# Patient Record
Sex: Male | Born: 1970 | Race: White | Hispanic: No | Marital: Single | Smoking: Current every day smoker
Health system: Southern US, Community
[De-identification: ages and names within clinical notes are randomized; demographics above are authoritative.]

## PROBLEM LIST (undated history)

## (undated) DIAGNOSIS — F329 Major depressive disorder, single episode, unspecified: Secondary | ICD-10-CM

## (undated) DIAGNOSIS — M545 Low back pain, unspecified: Secondary | ICD-10-CM

## (undated) DIAGNOSIS — G8929 Other chronic pain: Secondary | ICD-10-CM

## (undated) DIAGNOSIS — K259 Gastric ulcer, unspecified as acute or chronic, without hemorrhage or perforation: Secondary | ICD-10-CM

## (undated) DIAGNOSIS — F32A Depression, unspecified: Secondary | ICD-10-CM

## (undated) DIAGNOSIS — K922 Gastrointestinal hemorrhage, unspecified: Secondary | ICD-10-CM

## (undated) DIAGNOSIS — Z87442 Personal history of urinary calculi: Secondary | ICD-10-CM

## (undated) DIAGNOSIS — K7460 Unspecified cirrhosis of liver: Secondary | ICD-10-CM

## (undated) DIAGNOSIS — B1920 Unspecified viral hepatitis C without hepatic coma: Secondary | ICD-10-CM

## (undated) DIAGNOSIS — F419 Anxiety disorder, unspecified: Secondary | ICD-10-CM

## (undated) DIAGNOSIS — B1910 Unspecified viral hepatitis B without hepatic coma: Secondary | ICD-10-CM

## (undated) DIAGNOSIS — F1910 Other psychoactive substance abuse, uncomplicated: Secondary | ICD-10-CM

## (undated) HISTORY — PX: LUNG LOBECTOMY: SHX167

## (undated) HISTORY — PX: CYSTOSCOPY W/ STONE MANIPULATION: SHX1427

---

## 2003-10-15 ENCOUNTER — Encounter
Admission: RE | Admit: 2003-10-15 | Discharge: 2004-01-13 | Payer: Self-pay | Admitting: Physical Medicine & Rehabilitation

## 2004-02-01 ENCOUNTER — Encounter
Admission: RE | Admit: 2004-02-01 | Discharge: 2004-05-01 | Payer: Self-pay | Admitting: Physical Medicine & Rehabilitation

## 2004-03-03 ENCOUNTER — Inpatient Hospital Stay (HOSPITAL_COMMUNITY): Admission: RE | Admit: 2004-03-03 | Discharge: 2004-03-06 | Payer: Self-pay | Admitting: Psychiatry

## 2006-06-10 ENCOUNTER — Ambulatory Visit: Payer: Self-pay | Admitting: Critical Care Medicine

## 2006-06-10 ENCOUNTER — Inpatient Hospital Stay (HOSPITAL_COMMUNITY): Admission: AD | Admit: 2006-06-10 | Discharge: 2006-06-29 | Payer: Self-pay | Admitting: Emergency Medicine

## 2006-06-10 ENCOUNTER — Encounter (INDEPENDENT_AMBULATORY_CARE_PROVIDER_SITE_OTHER): Payer: Self-pay | Admitting: Specialist

## 2006-06-15 ENCOUNTER — Encounter: Payer: Self-pay | Admitting: Cardiology

## 2006-06-15 ENCOUNTER — Ambulatory Visit: Payer: Self-pay | Admitting: Cardiology

## 2006-06-23 ENCOUNTER — Ambulatory Visit: Payer: Self-pay | Admitting: Dentistry

## 2006-07-13 ENCOUNTER — Encounter: Admission: RE | Admit: 2006-07-13 | Discharge: 2006-07-13 | Payer: Self-pay | Admitting: Surgery

## 2007-11-28 ENCOUNTER — Emergency Department (HOSPITAL_COMMUNITY): Admission: EM | Admit: 2007-11-28 | Discharge: 2007-11-28 | Payer: Self-pay | Admitting: Emergency Medicine

## 2011-05-01 NOTE — Discharge Summary (Signed)
Dan [name], Dan [name]                   ACCOUNT NO.:  [id number]   MEDICAL RECORD NO.:  [id number]          PATIENT TYPE:  INP   LOCATION:  5739                         FACILITY:  [facility]   PHYSICIAN:  Danice Goltz, M.D. LHCDATE OF BIRTH:  09-10-1971   DATE OF ADMISSION:  06/10/2006  DATE OF DISCHARGE:  06/29/2006                                 DISCHARGE SUMMARY   DISCHARGE DIAGNOSES:  1.  Lung abscess with empyema with few Peptostreptococcus prevotii, status      post right thoracotomy with drainage of empyema and decortication of      right lung with chest tube insertion.  2.  Poor dentition.  3.  Chronic pain.  4.  Acute lung injury with hypoxic respiratory failure/ventilator-dependent      respiratory failure, resolved.   PROCEDURE:  1.  PICC line placed June 10, 2006, removed today June 29, 2006.  2.  Endotracheal tube placed June 10, 2006, removed June 20, 2006.  3.  Arterial line placed June 10, 2006, removed June 20, 2006.  4.  Right arterial jugular placed June 29, removed June 19, 2006.  5.  Right thoracotomy with drainage of empyema and decortication of right      lung completed on June 11, 2006.   CONSULTANTS:  Dr. Charlynne Pander with dental medicine, [facility]. Also Dr. Evelene Croon with CVTS.   LABORATORY DATA:  June 28, 2006, sodium 137, potassium 4, chloride 99, CO2  29, glucose 126, BUN 17, creatinine 0.6, AST 42, ALT 51, albumin 2.3. June 28, 2006, white blood cells 11.2, hemoglobin 10.9, hematocrit 32.9,  platelets 454.   Microbiology:  Blood cultures x2 on June 19, 2006 with coagulase negative  staph/staph epi (contamination). Sputum culture on June 15, 2006 negative for  organisms. June 10, 2006, blood cultures x2 negative. June 11, 2006, pleural  fluid culture from right empyema x2 cultures, both demonstrating few  Peptostreptococcus prevotii.   Radiology:  Chest x-ray obtained on June 28, 2006 demonstrating right chest  tube in  satisfactory position, diffuse interstitial lung disease which shows  significant improvement in increased aeration compared with previous film  obtained on June 24, 2006.   BRIEF HISTORY:  This is a 40 year old white male who was admitted in  transfer from [facility] with right lung pneumonia and significant  right pleural effusion in respiratory distress on June 10, 2006. He had  apparently originally presented to [facility] on June 08, 2006 with  right lung pneumonia and pleural effusion. There was cavitation of the right  upper lobe, and the patient developed progressive respiratory distress. He  has a history of cocaine abuse, alcohol abuse. He is a smoker. He has  chronic back pain for which he takes OxyContin at home. Socially, the  patient is single. He is a 2 pack per day smoker for greater than 20 years,  history of moderate alcohol abuse, history of cocaine abuse, is on chronic  OxyContin for back pain. He additionally was on house arrest in [facility].   HOSPITAL COURSE  BY DISCHARGE DIAGNOSES:  1.  Bilateral necrotizing pneumonia with right sided empyema with resultant      acute lung injury, acute respiratory distress and ventilatory dependence      (resolved). Dan [name] was admitted to [facility] in transfer on      June 10, 2006, initially on 100% FiO2 support. He developed      significantly progressive increased work of breathing whereby later on      the day of admission he required oral intubation by anesthesia. He was      maintained on mechanical ventilation, his vital signs were stabilized      with aggressive volume resuscitation, also he was given empiric      antibiotics consisting of vancomycin, cefepime, Zithromax and Cleocin.      This was later changed to single coverage with  Zosyn on June 12, 2006      and discontinued and replaced with Cleocin on June 23, 2006 for which he      will complete 3 more weeks after time of discharge  of therapy.      Thoracentesis was obtained demonstrating Peptostreptococcus prevotii.      Thoracic surgery was consulted. Dr. Evelene Croon brought [name] to the      operating room and performed a right thoracotomy, drainage of right      empyema with decortication of right lung and placement of chest tube.      The patient's surgery was unremarkable. He did have a moderate amount of      bronchopleural fistula postoperatively requiring ventilation with low      levels of PEEP and chest tube to suction. This eventually resolved. He      was eventually successfully weaned and extubated from mechanical      ventilation. Upon time of discharge, he still has a right chest tube in      place which is draining slightly purulent drainage. His chest x-ray      continues to resolve in respect to his acute lung injury. He has      followup with thoracic surgery for further evaluation of disposition of      chest tube. Home health has been ordered in regards to chest tube site      care and chest tube maintenance. He will be discharged to home with Mini      Express Pleur-evac which is a portable chest tube device. Additionally,      he will be sent home on daily spironolactone and twice daily Advair. As      previously noted, he will complete three more weeks of oral Cleocin. No      further pulmonary followup is noted. He does have followup arranged with      thoracic surgery in regards to chest tube management.  2.  Poor dentition. The patient has very poor dentition, and this perhaps      represents again future risk for recurrent bacterial infections. Dr. Dian Queen was consulted inpatient. At this time, no surgical intervention      was needed. However, potential therapies could include multiple      extractions with alveoplasty, periodontal therapy, dental restoration,      crown or bridge therapy, root canal therapy, implant therapy, or     replacing missing teeth as indicated. The  potential therapy for the      patient's dental health will be deferred to Dr. Dian Queen. Followup  was arranged with Dr. Kristin Bruins on July 21, 2006 at which time the      patient will report to the [facility] for      followup.  3.  Chronic pain with history of polysubstance abuse. Mr. Vincent has a long      history of chronic pain and has had notable pain medication needs while      in the hospital. Given his complicated medical history in regards to      narcotic abuse, we will defer to his outpatient internal medicine      physician for management of his chronic pain syndrome. He had previously      been followed by Dr. Jeanie Sewer at [facility]. He will now      be followed by Dr. Feliciana Rossetti; phone number [phone]. We      respectively will defer all anxiolytic and narcotic medications to Dr.      Quintella Reichert to avoid multiple prescriptions of narcotics and hopefully limit      risk of polypharmacy/narcotic abuse.   DISCHARGE INSTRUCTIONS:  1.  Diet as tolerated.  2.  Activity as tolerated.  3.  Sterile gauze occlusive dressing to chest tube site with routine skin      care per home health.   MEDICATIONS:  1.  Advair Diskus 250/50 mcg 1 puff twice a day.  2.  Cleocin 300-mg tablets 2 tablets q.8h. until gone.  3.  Nu-Iron 150 mg 1 tablet twice a day.  4.  Spiriva 18 mcg 2 puffs 1 tablet daily.  5.  Multivitamin daily.  6.  Folic 1 mg daily.  7.  All pain medication and anxiolytics will be deferred to the patient's      primary care physician, Dr. Feliciana Rossetti in [place].   PHYSICAL EXAMINATION AT TIME OF DISCHARGE:  VITAL SIGNS:  Temperature 97.9,  heart rate 72, respirations 18, blood pressure 104/59, blood glucose 109.  Room air saturation 98%; he ambulated approximately 150 feet in the hall  with saturations dropping no lower than 94%.  HEENT:  Notable for poor oral dentition. No JVD or adenopathy.  PULMONARY:  Faint crackles in  bilateral lower lobes, slightly more noted in  the right. He has a right chest tube with a Mini Express Pleur-evac in place  on the right. There is a scant amount of yellow color discharge around the  chest tube insertion site. The insertion site is slightly erythematous but  otherwise unremarkable. He reports no significant pain to palpation. The  chest tube circuit does have some purulent drainage in the chest tube site,  and he puts out approximately 75 mL of drainage about every 8 hours.  CARDIAC:  Regular rate and rhythm.  EXTREMITIES:  Warm without edema. He does have brisk capillary refill. He  does have a right upper extremity PICC line currently in place which will be  removed prior to discharge.  ABDOMEN:  Soft and nontender. Tolerating diet.  NEUROLOGICAL:  Grossly intact.   DISPOSITION:  Currently, patient will be discharged to home. As previously  noted, Mr. Maryland is under house arrest. A message was left with the [facility] Department for the patient to have his leg bracelet  reapplied. He will have home health nurse followup in the outpatient  setting. He is currently ready for discharge and stable from a medical  standpoint. No further pulmonary critical care medicine followup required.  Over 30 minutes' time was dedicated  to this discharge.      Anders Simmonds, N.P. [facility]    ______________________________  Danice Goltz, M.D. [facility]    PB/MEDQ  D:  06/29/2006  T:  06/29/2006  Job:  [id number]   cc:   Evelene Croon, M.D.  [address]  [place]  [place] [address]   Lacretia Leigh. Quintella Reichert, M.D.  [address]  [place] [address]   Charlynne Pander, D.D.S.  Fax: [phone]   Danice Goltz, M.D. [facility]  [address] [place], [place] [address]

## 2011-05-01 NOTE — Consult Note (Signed)
NAMEDEMARQUIS, OSLEY                   ACCOUNT NO.:  [id number]   MEDICAL RECORD NO.:  [id number]           PATIENT TYPE:   LOCATION:                               FACILITY:  [facility]   PHYSICIAN:  Charlynne Pander, D.D.S.DATE OF BIRTH:  04/25/1971   DATE OF CONSULTATION:  06/23/2006  DATE OF DISCHARGE:                                   CONSULTATION   Dan [name] is a 40 year old male recently admitted with bilateral necrotizing  pneumonia.  The patient subsequently underwent a right thoracotomy with  draining of the empyema on June 11, 2006 with Dr. Laneta Simmers.  A dental  consultation was requested to rule out dental infection which may affect the  patient's systemic health.   MEDICAL HISTORY:  1.  Bilateral necrotizing pneumonia with a history of ventilator dependent      respiratory failure.  2.  The patient is status post right thoracotomy with draining of the      empyema and placement of and drains.  This was done by Dr. Laneta Simmers on      June 11, 2006.  The patient is currently followed by a pulmonary      medicine.  3.  History of polysubstance abuse including cocaine, alcohol, and tobacco.  4.  Chronic back pain, status post surgery approximately 5 years ago.      Patient with a history of utilizing OxyContin 40 mg twice daily.  5.  History of atrial fibrillation this admission which has returned to      normal sinus rhythm.   ALLERGIES:  None known.   MEDICATIONS:  1.  NovoLog insulin per sliding scale.  2.  Atrovent inhalation therapy every 6 hours.  3.  Xopenex inhalation therapy every 6 hours.  4.  Lovenox 40 mg subcutaneously every 24 hours.  5.  Folic acid 1 mg daily.  6.  Thiamine 100 mg daily.  7.  Klonopin 1 mg at bedtime.  8.  Protonix 40 mg daily.  9.  OxyContin 40 mg daily.  10. Cleocin 600 mg every 6 hours.   SOCIAL HISTORY:  The patient is unemployed.  The patient is single.  Patient  with a history of smoking two packs per day for 20 years.  Patient with a  history of moderate alcohol use, although the patient denies use recently.  The patient also with a history of cocaine abuse.   FAMILY HISTORY:  Mother with a history of diabetes mellitus.  Father with a  history of coronary disease.   FUNCTIONAL ASSESSMENT:  The patient was independent for ADLs prior to this  admission.   REVIEW OF SYSTEMS:  This is reviewed from the chart and health history  assessment form for this admission.   DENTAL HISTORY.:   CHIEF COMPLAINT:  Dental consultation requested to evaluate poor  dentition.   HISTORY OF PRESENT ILLNESS:  The patient recently admitted with a history of  bilateral necrotizing pneumonia.  The patient known to have a poor  dentition.  The patient now seen to rule out dental infection which may be  affecting  the patient's systemic health.  The patient currently denies acute  toothache symptoms.  The patient indicates that he last saw dentist 2-3  years ago for an exam and cleaning.  The patient denies presence of  dentures.  The patient after further discussion does have an indication of  previous toothache of the upper right quadrant approximately 2 months ago.  The patient described the pain as sharp, throbbing, and lasted 3-4 days  constantly.  The patient described the pain as being 10/10 intensity.  The  patient again indicates that he is 0/10 intensity pain.   DENTAL EXAMINATION:  GENERAL:  The patient is a well-developed, slightly  built male in no acute distress.  VITAL SIGNS:  Blood pressure is 115/68, pulse 95, respirations are 20,  temperature is 98.0.  HEAD/NECK:  There is no palpable lymphadenopathy.  The patient denies acute  TMJ symptoms.  INTRAORAL:  The patient has normal saliva.  There is no evidence of abscess  formation within the mouth at this time.  DENTITION:  Patient with multiple missing teeth.  I would need dental x-ray  to identify the exact tooth numbers present and missing.  PERIODONTAL: The patient  with chronic advanced periodontal disease with  plaque and calculus accumulations, gingival recession, tooth mobility, and  moderate to severe bone loss.  DENTAL CARIES:  The patient may have several dental caries noted.  I would  need dental x-rays to identify the extent of the dental caries.  ENDODONTIC: H/O acute pulpitis symptoms. none today.  CROWN/BRIDGES:  There are no crown or bridge restorations.  PROSTHODONTIC:  There are no dentures.  OCCLUSION:  The patient with a poor occlusal scheme.   RADIOGRAPHIC INTERPRETATION:  A panoramic x-ray has been ordered and will be  taken once the patient is able to sit in a wheelchair or stand  independently.   ASSESSMENT:  1.  Plaque and calculus accumulations.  2.  Chronic periodontitis with bone loss.  3.  Gingival recession.  4.  Tooth mobility.  5.  History of oral neglect.  6.  Multiple missing teeth.  7.  Supereruption and drifting of the unopposed teeth into the edentulous      areas.  8.  Poor occlusal scheme.  9.  Current Lovenox with a risk for bleeding with invasive dental      procedures.  10. Current significant medical compromise with need for healing prior to      multiple invasive dental procedures.   PLAN/RECOMMENDATIONS:  1.  I have discussed the risks, benefits, and complications of various      treatment options with the patient in relationship to his medical and      dental conditions.  We discussed obtaining dental x-rays and then      provision of dental treatment to include no treatment, multiple      extractions with alveoloplasty, periodontal therapy, dental      restorations, crown or bridge therapy, root canal therapy, implant      therapy, replacing missing teeth as indicated.  The patient currently      agrees to have the panoramic x-ray obtained, once he is able to stand or      sit in a wheelchair.  The patient will then be offered the various      treatment options as above.  1.  Discussion of  findings with Dr. Evelene Croon and Dr. Shan Levans as      indicated with coordination of future dental treatment as  indicated.      Charlynne Pander, D.D.S.  Electronically Signed     RFK/MEDQ  D:  06/23/2006  T:  06/23/2006  Job:  [id number]   cc:   Evelene Croon, M.D.  [address]  [place]  [place] [address]   Shan Levans, M.D. [facility]  [address]  [place]  [place] [address]   Charlynne Pander, D.D.S.  Fax: [phone]

## 2011-05-01 NOTE — Assessment & Plan Note (Signed)
REASON FOR EVALUATION:  Dan [name] is back regarding his low back pain.  He was  scheduled for lumbar facet injections today but did not have a ride and  there were some issues regarding some flu-like symptoms and it was decided  not to do the injections today.  The patient had stopped taking his  OxyContin as these made him nauseated.  He apparently never took the Kadian,  although I am not really clear on what happened.  It sounds like he  mistakenly used the Gabitril in place of the Kadian and stopped using these  and then wanted the OxyContin once again.  He tells me that Kadian  prescription is probably still at his pharmacy.  He has used a few extra  Roxicodone for pain at 15 mg q.8-12 h.  Pain remains predominantly in the  back areas and also into the legs.   REVIEW OF SYSTEMS:  The patient reports some GI symptoms and flu-like  issues.  He has had no problems with his mood or sleep.   PHYSICAL EXAMINATION:  GENERAL:  On physical examination today, the patient  is pleasant and in no acute distress.  VITAL SIGNS:  Blood pressure is 144/73, pulse is 83, respiratory rate is 16  and saturating 99% on room air.  NEUROMUSCULAR:  Patient's motor and sensory exams were within normal limits.  Reflexes were present at 1+, except for the right ankle.  Sensory exam and  motor exam were intact.  He had positive straight leg raise on the right  side.   ASSESSMENT:  1. Post-laminectomy syndrome.  2. Lumbar radiculopathy affecting the S1 nerve root on the right.   PLAN:  1. We will again try to set up the patient for a transforaminal lumbar     epidural injection at L5-S1 on the right side.  2. He will go back to the pharmacy and pick up his Kadian prescription, 40     mg q.12 h.; #60 were initially dispensed.  He may try to take the     medicine daily if he has nausea side-effects.  3. He may use the Roxicodone 15 mg q.12 h. for breakthrough pain.  4. I will see him back in a month, regardless  of when his injection is     scheduled.  We did have a urine drug screen performed today.      Ranelle Oyster, M.D.   ZTS/MedQ  D:  02/05/2004 14:09:49  T:  02/05/2004 14:52:45  Job #:  [id number]   cc:   [place], Gerrit Friends.D.

## 2011-05-01 NOTE — Discharge Summary (Signed)
Dan [name], FITZWATER                               ACCOUNT NO.:  [id number]   MEDICAL RECORD NO.:  [id number]                   PATIENT TYPE:  IPS   LOCATION:  0505                                 FACILITY:  [facility]   PHYSICIAN:  Geoffery Lyons, M.D.                   DATE OF BIRTH:  Jan 04, 1971   DATE OF ADMISSION:  03/03/2004  DATE OF DISCHARGE:  03/06/2004                                 DISCHARGE SUMMARY   CHIEF COMPLAINT AND PRESENT ILLNESS:  This was the first admission to [facility] for this 40 year old single white male voluntarily  admitted.  Presented requesting help getting off pain pills.  He has used in  an escalating fashion over the past six years.  Using OxyContin 400-500 mg  per day.  Last use was on March 02, 2004 (two days prior) using 400 mg.  Also using cocaine one time last week.  Also using Xanax. Denied suicidal  ideation, homicidal ideation, hallucinations or mood swings.   PAST PSYCHIATRIC HISTORY:  First time at [facility].  No prior  detox.  No outpatient treatment.   ALCOHOL/DRUG HISTORY:  Denies any other substance use other than the pain  pills and also use of cocaine.   MEDICAL HISTORY:  Chronic back pain.   MEDICATIONS:  1 mg four times a day.   PHYSICAL EXAMINATION:  Performed and failed to show any acute findings.   LABORATORY DATA:  CBC within normal limits.  Blood chemistry within normal  limits.  Thyroid profile within normal limits.   MENTAL STATUS EXAM:  Fully alert male, irritable, passively cooperative .  Speech normal rate, production and tempo.  Mood irritable.  Affect  irritable.  Thought processes logical and coherent.  No delusions.  No  evidence of suicidal or homicidal ideation.  No evidence of hallucinations.  Cognition well-preserved.   ADMISSION DIAGNOSES:   AXIS I:  1. Opiate dependence.  2. Cocaine abuse.  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   AXIS II:  No diagnosis.   AXIS III:   Back pain.   AXIS IV:  Moderate.   AXIS V:  Global Assessment of Functioning upon admission 31; highest Global  Assessment of Functioning in the last year 65.   HOSPITAL COURSE:  He was admitted and detoxified with clonidine.  He was  given some Flexeril, some Seroquel 50 mg every six hours as needed and he  was given ibuprofen for the back pain.  On March 06, 2004, he felt much  better.  He felt he was not having the withdrawal like he thought he was  going to have.  Felt encouraged by this.  He was endorsing no suicidal  ideation, no homicidal ideation.  His mood was euthymic.  His affect was  bright.  He was willing and motivated to pursue outpatient treatment and  felt that  he was going to go into a very stable environment.   DISCHARGE DIAGNOSES:   AXIS I:  1. Opiate dependence.  2. Cocaine abuse.  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   AXIS II:  No diagnosis.   AXIS III:  Chronic back pain.   AXIS IV:  Moderate.   AXIS V:  Global Assessment of Functioning upon discharge 50.   DISCHARGE MEDICATIONS:  1. Naproxen 500 mg twice a day.  2. Seroquel 25 mg 1-2 every eight hours as needed for anxiety.   FOLLOW UP:  CD IOP.                                               Geoffery Lyons, M.D.    IL/MEDQ  D:  03/25/2004  T:  03/26/2004  Job:  [id number]

## 2011-05-01 NOTE — Op Note (Signed)
Dan [name], Dan [name]                   ACCOUNT NO.:  [id number]   MEDICAL RECORD NO.:  [id number]          PATIENT TYPE:  INP   LOCATION:  2111                         FACILITY:  [facility]   PHYSICIAN:  Evelene Croon, M.D.     DATE OF BIRTH:  July 23, 1971   DATE OF PROCEDURE:  06/11/2006  DATE OF DISCHARGE:                                 OPERATIVE REPORT   PREOPERATIVE DIAGNOSIS:  Bilateral necrotizing pneumonia with right empyema.   POSTOPERATIVE DIAGNOSIS:  Bilateral necrotizing pneumonia with right  empyema.   OPERATIVE PROCEDURE:  Right thoracotomy, drainage of empyema, decortication  of the right lung.   ATTENDING SURGEON:  Evelene Croon, M.D.   ASSISTANT:  Coral Ceo, P.A.-C   ANESTHESIA:  General endotracheal.   CLINICAL HISTORY:  This patient is a 40 year old gentleman with history of  drug abuse and alcohol abuse and smoking, who presented with severe  bilateral necrotizing pneumonia.  He developed a right empyema with  respiratory failure, requiring intubation yesterday.  Repeat CT scan showed  a complex right pleural empyema with loculated pneumothorax.  The right lung  was essentially collapsed by this process.  There was severe left lung  pneumonia.  The patient remained on 70% oxygen with 14 of PEEP.  I did not  feel this process was suitable for draining with a chest tube and would  require open surgical drainage to try to salvage this patient.  I discussed  the operative procedure with the patient's mother and fiancee.  I discussed  alternatives, benefits, and the high risks including, but not limited to,  bleeding, blood transfusion, persistent infection, organ failure, and death.  They understood and agreed to proceed.   OPERATIVE PROCEDURE:  The patient was taken to the operating room and placed  on the table in a supine position.  After induction of general endotracheal  anesthesia, the single-lumen endotracheal tube was switched to a double-  lumen tube by  Anesthesiology.  Proper position was confirmed.  Then the  patient was turned to the left lateral decubitus position with the right  side up.  He remained critically ill, with oxygen saturation dropping down  into the 80s.  Then the right chest was prepped with Betadine soap and  solution and draped in the usual sterile manner.  A time-out was taken and  the proper patient, proper operative side and proper operation were  confirmed with Anesthesia and nursing staff.  Then the right chest was  opened through a lateral thoracotomy incision.  The pleural space was  entered through the 5th intercostal space.  There was a very complex empyema  present with a large amount of fibrinous debris and purulent material  present in pockets throughout the pleural space.  The lungs were covered  with this material.  The right upper lobe appeared to be the primary site of  this pneumonia and was very firm and adhered to the posterolateral chest  Sal[place].  In addition, there was a ruptured lung abscess from the inferior  aspect of the right upper lobe and this was felt to  primary be responsible  for this empyema.  The abscess was unroofed and well-drained and all  necrotic debris was removed.  Then after draining the empyema completely,  decortication was performed by peeling the fibrinous peel off of all the  lung surfaces.  This allowed good re-expansion of the lung.  The pleural  space was then irrigated with saline solution.  There was good hemostasis.  Three chest tubes were placed with a tube in the anterior chest, a tube in  the posterior chest and a right-angle tube lying in the costophrenic recess.  The middle tube in the posterior chest was draining the area of the lung  abscess.  Then the lung was re-expanded and completely filled pleural space.  The ribs were then reapproximated with #2 Vicryl pericostal sutures.  The  muscles were closed in layers with continuous #1 Vicryl suture.  The   subcutaneous tissue was closed with a continuous 2-0 Vicryl and the skin  closed with skin staples.  The sponge, needles and instrument counts were  correct according to the scrub nurse.  A dry sterile dressing was applied  over the incision and around the chest tubes, which were hooked to Pleur-  evac suction.  The patient remained hemodynamically stable and was turned a  supine position.  A single-lumen tube was then exchanged for the double-  lumen tube.  The patient was then transferred back to the medical intensive  care unit in critical, but stable condition.      Evelene Croon, M.D.  Electronically Signed     BB/MEDQ  D:  06/11/2006  T:  06/12/2006  Job:  [id number]

## 2011-05-01 NOTE — Assessment & Plan Note (Signed)
REASON FOR EVALUATION:  Mr. Umar is back regarding his low back pain.  He  had an MRI performed in December which showed some mild disk herniation and  foraminal narrowing but no obvious nerve root compression.  On my review of  the MRI, there is obvious disk herniation at L4-L5, L5-S1, with preference  towards the right side.  The patient has had some family and social issues  of late.  He was up in the emergency room with a friend last night for most  of the evening.  He continues to have pain in the low back and right hip,  running down the posterolateral leg.  The pain is worse with bending and  coughing.  He also has difficulty walking for prolonged periods.  He has  difficulty sleeping secondary to his pain.  The OxyContin is helping him to  a certain degree with his pain, as is the Roxicodone.  His bowel and bladder  function is intact.  He does notice some weakness in the right foot,  although he does not know if this is from his pain or not.   REVIEW OF SYSTEMS:  On review of systems, the patient denies any bowel or  bladder complaints; no chest pain, shortness of breath; no nausea, vomiting,  diarrhea or constipation.  No problems with falls or mood for the most part.   PHYSICAL EXAMINATION:  GENERAL:  On physical examination today, the patient  is in mild distress.  VITAL SIGNS:  Blood pressure is 169/18.  Pulse is 118.  He is saturating 99%  on room air.  NEUROMUSCULAR:  His gait is antalgic towards the right and he has difficulty  walking with an erect posture.  The patient has continued weakness in  plantarflexion and to a lesser extent in knee flexion and ankle  dorsiflexion, although he had pain with these movements.  Deep tendon  reflexes were absent on the right ankle and 1+ elsewhere in the lower  extremities.  Sensory exam was nonfocal.  He had a positive straight leg  raise on the right side.  He has significant pain throughout palpation of  the lumbar facets and  paraspinal muscles as well as the PSIS areas, right  greater than left side.  The patient had difficulty not only standing erect  from an AP standpoint, but also left to right and had difficulty standing  straight and tended to lean towards the left side.   ASSESSMENT:  1. Post-laminectomy syndrome.  2. Lumbar radiculopathy obviously affecting the S1 nerve root on the right     side possibly secondary to his disks.   PLAN:  1. I would like to set the patient up for transforaminal lumbar epidural     steroid injection at L5-S1 on the right side.  We will set him up with     Dr. Celene Kras for this injection.  2. We will switch him over from OxyContin to Kadian 40 mg q.12 h. to better     control his pain.  3. He will use Roxicodone 15 mg 1 q.12 h. p.r.n. for breakthrough pain.  4. The patient was unable to tolerate Topamax so we will begin a trial of     Gabitril 4 mg nightly, titrating up to 4 mg b.i.d. after 1 week.  5. I will see the patient back pending completion of his epidural     injections.      Ranelle Oyster, M.D.   ZTS/MedQ  D:  01/02/2004 12:03:05  T:  01/02/2004 13:17:51  Job #:  [id number]   cc:   Williford, Gerrit Friends.D.

## 2011-05-01 NOTE — H&P (Signed)
NAMELAVAR, ROSENZWEIG NO.:  [id number]   MEDICAL RECORD NO.:  [id number]          PATIENT TYPE:  INP   LOCATION:  2111                         FACILITY:  [facility]   PHYSICIAN:  Judie Petit, M.D. DATE OF BIRTH:  Apr 09, 1971   DATE OF ADMISSION:  06/10/2006  DATE OF DISCHARGE:                                HISTORY & PHYSICAL   Mr. Cory is a 40 year old male who was in respiratory distress.  The  anesthesia team was asked by Dr. Shan Levans to intubate this patient.  On arrival to the room, O2 saturation in the 80s.  The patient was on BiPAP  with respiratory compromise; however, able to talk and was alert and  consented to the procedure.  Systolic blood pressure 160s, heart rate 120s.   After a brief discussion with Dr. Delford Field, a rapid-sequence intubation with  cricoid pressure, 250 mg of Pentothal Sodium and succinylcholine 120 mg.  A  #3 MAC was utilized by CRNA, Greer Pickerel, and a #8.0 endotracheal tube was  placed on the initial attempt without difficulty.  Positive bilateral breath  sounds.  Positive end-tidal CO2 by __________.  Tube was secured by RT.  Vital signs stable.  Systolic blood pressure 160s.  Heart rate 130s.  O2  saturation low 90s after intubation.  Dr. Delford Field in the room.   PLAN:  Chest x-ray pending.  Care per Dr. Delford Field.           ______________________________  Judie Petit, M.D.     CE/MEDQ  D:  06/10/2006  T:  06/10/2006  Job:  [id number]

## 2011-05-01 NOTE — Assessment & Plan Note (Signed)
FOLLOW UP VISIT:  Dan [name] is back regarding his low back pain.  I felt his  pain was most likely in the L5/S1 radicular distribution and also involving  post-laminectomy symptoms as well.  I reviewed his MRI from 2000 which  showed obvious herniated disks at L4-5 and L5-S1, being the most prominent  to the right.   The patient has had some improvement of his pain with the OxyContin but it  is still not controlling pain.  He is still rating his pain as an 8 to 9 out  of 10.  His Oxycodone is used for break-through pain.  He is having numbness  and pain into the leg and into the back of the right foot.  There is also  some pain in the shoulders and neck related to some stiffness but nothing  compared to the back and right leg.   The patient denies any bowel or bladder complaints.  His sleep is impaired  with the pain.  The pain is usually made worse with sitting for prolonged  periods or any type of activity.   REVIEW OF SYSTEMS:  The patient notes difficulty walking.  Sleep as  mentioned above.  No bowel or bladder complaints.  Mood has been fair though  he is a bit more irritable with the pain.  No chest pain or shortness of  breath are noted.   PHYSICAL EXAMINATION TODAY:  GENERAL:  The patient is pleasant, in no acute  distress.  VITAL SIGNS:  Blood pressure is 143/85, pulse is 105, he is saturating 98%  on room air.   The patient has a significant antalgic gait to the right and has a difficult  time standing straight secondary to inability to put full weight to the  right leg.  He has probable ankle plantar flexor weakness although it is  difficult to discern because he has give away weakness throughout the leg  secondary to pain.  Deep tendon reflex appears absent at the heel on the  right.  Otherwise he is 1+ to 2+ in the lower extremities.  Sensory exam was  difficult to follow today.  He had a positive straight leg raise in the  supine and seated positions.  Dan [name] test was  not tested today.   ASSESSMENT:  1. Post-laminectomy syndrome.  2. Lumbar radiculopathy involving S1 nerve root most prominently.   PLAN:  1. Will need to get an MRI of the lumbar spine as the patient was unable to     do so secondary to scheduling problems at [facility].  2. We will increase his OxyContin to 40 mg q.12h with 15 mg of Roxicodone     for break-through pain.  Dispensed #120 20 mg tablets today and #45     Roxicodone today.  3. For neuropathic pain will begin him on a trial of Topamax 25 mg q.h.s.     for one week and then 50 mg q.h.s. thereafter.  4. I will see the patient back in about four week's time.  Will check     another urine drug screen at that point.      Ranelle Oyster, M.D.   ZTS/MedQ  D:  11/21/2003 10:45:37  T:  11/21/2003 16:10:96  Job #:  [id number]   cc:   Myra Rude, M.D.

## 2016-07-14 ENCOUNTER — Emergency Department (HOSPITAL_COMMUNITY): Payer: Medicaid Other

## 2016-07-14 ENCOUNTER — Encounter (HOSPITAL_COMMUNITY): Payer: Self-pay | Admitting: Emergency Medicine

## 2016-07-14 ENCOUNTER — Inpatient Hospital Stay (HOSPITAL_COMMUNITY)
Admission: EM | Admit: 2016-07-14 | Discharge: 2016-07-17 | DRG: 378 | Disposition: A | Discharge status: Home / Self Care | Payer: Medicaid Other | Attending: Student in an Organized Health Care Education/Training Program | Admitting: Student in an Organized Health Care Education/Training Program

## 2016-07-14 DIAGNOSIS — K828 Other specified diseases of gallbladder: Secondary | ICD-10-CM

## 2016-07-14 DIAGNOSIS — F1410 Cocaine abuse, uncomplicated: Secondary | ICD-10-CM | POA: Diagnosis present

## 2016-07-14 DIAGNOSIS — K7031 Alcoholic cirrhosis of liver with ascites: Secondary | ICD-10-CM | POA: Diagnosis not present

## 2016-07-14 DIAGNOSIS — F17210 Nicotine dependence, cigarettes, uncomplicated: Secondary | ICD-10-CM | POA: Diagnosis present

## 2016-07-14 DIAGNOSIS — B1910 Unspecified viral hepatitis B without hepatic coma: Secondary | ICD-10-CM | POA: Diagnosis not present

## 2016-07-14 DIAGNOSIS — R1010 Upper abdominal pain, unspecified: Secondary | ICD-10-CM

## 2016-07-14 DIAGNOSIS — K922 Gastrointestinal hemorrhage, unspecified: Secondary | ICD-10-CM

## 2016-07-14 DIAGNOSIS — K920 Hematemesis: Secondary | ICD-10-CM | POA: Diagnosis not present

## 2016-07-14 DIAGNOSIS — I8500 Esophageal varices without bleeding: Secondary | ICD-10-CM

## 2016-07-14 DIAGNOSIS — R188 Other ascites: Secondary | ICD-10-CM | POA: Diagnosis present

## 2016-07-14 DIAGNOSIS — K7689 Other specified diseases of liver: Secondary | ICD-10-CM | POA: Diagnosis not present

## 2016-07-14 DIAGNOSIS — R740 Nonspecific elevation of levels of transaminase and lactic acid dehydrogenase [LDH]: Secondary | ICD-10-CM

## 2016-07-14 DIAGNOSIS — E871 Hypo-osmolality and hyponatremia: Secondary | ICD-10-CM | POA: Diagnosis present

## 2016-07-14 DIAGNOSIS — K2970 Gastritis, unspecified, without bleeding: Secondary | ICD-10-CM | POA: Diagnosis present

## 2016-07-14 DIAGNOSIS — R161 Splenomegaly, not elsewhere classified: Secondary | ICD-10-CM

## 2016-07-14 DIAGNOSIS — D62 Acute posthemorrhagic anemia: Secondary | ICD-10-CM | POA: Diagnosis present

## 2016-07-14 DIAGNOSIS — R1013 Epigastric pain: Secondary | ICD-10-CM

## 2016-07-14 DIAGNOSIS — F1120 Opioid dependence, uncomplicated: Secondary | ICD-10-CM | POA: Diagnosis present

## 2016-07-14 DIAGNOSIS — B1920 Unspecified viral hepatitis C without hepatic coma: Secondary | ICD-10-CM | POA: Diagnosis not present

## 2016-07-14 DIAGNOSIS — R109 Unspecified abdominal pain: Secondary | ICD-10-CM

## 2016-07-14 DIAGNOSIS — K7460 Unspecified cirrhosis of liver: Secondary | ICD-10-CM | POA: Diagnosis not present

## 2016-07-14 DIAGNOSIS — D72829 Elevated white blood cell count, unspecified: Secondary | ICD-10-CM | POA: Diagnosis present

## 2016-07-14 DIAGNOSIS — K254 Chronic or unspecified gastric ulcer with hemorrhage: Principal | ICD-10-CM | POA: Diagnosis present

## 2016-07-14 DIAGNOSIS — K3189 Other diseases of stomach and duodenum: Secondary | ICD-10-CM | POA: Diagnosis present

## 2016-07-14 DIAGNOSIS — K766 Portal hypertension: Secondary | ICD-10-CM | POA: Diagnosis present

## 2016-07-14 DIAGNOSIS — R1084 Generalized abdominal pain: Secondary | ICD-10-CM

## 2016-07-14 DIAGNOSIS — K257 Chronic gastric ulcer without hemorrhage or perforation: Secondary | ICD-10-CM

## 2016-07-14 DIAGNOSIS — I10 Essential (primary) hypertension: Secondary | ICD-10-CM | POA: Diagnosis present

## 2016-07-14 DIAGNOSIS — F1010 Alcohol abuse, uncomplicated: Secondary | ICD-10-CM | POA: Diagnosis present

## 2016-07-14 DIAGNOSIS — F1910 Other psychoactive substance abuse, uncomplicated: Secondary | ICD-10-CM

## 2016-07-14 DIAGNOSIS — F329 Major depressive disorder, single episode, unspecified: Secondary | ICD-10-CM | POA: Diagnosis present

## 2016-07-14 DIAGNOSIS — B182 Chronic viral hepatitis C: Secondary | ICD-10-CM | POA: Diagnosis present

## 2016-07-14 DIAGNOSIS — E8809 Other disorders of plasma-protein metabolism, not elsewhere classified: Secondary | ICD-10-CM | POA: Diagnosis present

## 2016-07-14 DIAGNOSIS — K7469 Other cirrhosis of liver: Secondary | ICD-10-CM | POA: Diagnosis present

## 2016-07-14 DIAGNOSIS — B181 Chronic viral hepatitis B without delta-agent: Secondary | ICD-10-CM | POA: Diagnosis present

## 2016-07-14 DIAGNOSIS — D6959 Other secondary thrombocytopenia: Secondary | ICD-10-CM | POA: Diagnosis present

## 2016-07-14 HISTORY — DX: Gastrointestinal hemorrhage, unspecified: K92.2

## 2016-07-14 HISTORY — DX: Unspecified cirrhosis of liver: K74.60

## 2016-07-14 HISTORY — DX: Unspecified viral hepatitis C without hepatic coma: B19.20

## 2016-07-14 HISTORY — DX: Gastric ulcer, unspecified as acute or chronic, without hemorrhage or perforation: K25.9

## 2016-07-14 LAB — CBC WITH DIFFERENTIAL/PLATELET
Basophils Absolute: 0 10*3/uL (ref 0.0–0.1)
Basophils Relative: 0 %
EOS ABS: 0 10*3/uL (ref 0.0–0.7)
Eosinophils Relative: 0 %
HEMATOCRIT: 40.5 % (ref 39.0–52.0)
HEMOGLOBIN: 13.4 g/dL (ref 13.0–17.0)
LYMPHS ABS: 0.7 10*3/uL (ref 0.7–4.0)
LYMPHS PCT: 6 %
MCH: 31.5 pg (ref 26.0–34.0)
MCHC: 33.1 g/dL (ref 30.0–36.0)
MCV: 95.1 fL (ref 78.0–100.0)
Monocytes Absolute: 0.8 10*3/uL (ref 0.1–1.0)
Monocytes Relative: 7 %
NEUTROS ABS: 10.2 10*3/uL — AB (ref 1.7–7.7)
NEUTROS PCT: 86 %
Platelets: 62 10*3/uL — ABNORMAL LOW (ref 150–400)
RBC: 4.26 MIL/uL (ref 4.22–5.81)
RDW: 14.4 % (ref 11.5–15.5)
WBC: 11.8 10*3/uL — AB (ref 4.0–10.5)

## 2016-07-14 LAB — CBC
HCT: 39.4 % (ref 39.0–52.0)
HEMOGLOBIN: 13.1 g/dL (ref 13.0–17.0)
MCH: 31.5 pg (ref 26.0–34.0)
MCHC: 33.2 g/dL (ref 30.0–36.0)
MCV: 94.7 fL (ref 78.0–100.0)
Platelets: 55 10*3/uL — ABNORMAL LOW (ref 150–400)
RBC: 4.16 MIL/uL — ABNORMAL LOW (ref 4.22–5.81)
RDW: 14.3 % (ref 11.5–15.5)
WBC: 11 10*3/uL — ABNORMAL HIGH (ref 4.0–10.5)

## 2016-07-14 LAB — RAPID URINE DRUG SCREEN, HOSP PERFORMED
Amphetamines: NOT DETECTED
BENZODIAZEPINES: POSITIVE — AB
Barbiturates: NOT DETECTED
COCAINE: NOT DETECTED
OPIATES: NOT DETECTED
Tetrahydrocannabinol: POSITIVE — AB

## 2016-07-14 LAB — COMPREHENSIVE METABOLIC PANEL
ALBUMIN: 2.1 g/dL — AB (ref 3.5–5.0)
ALK PHOS: 131 U/L — AB (ref 38–126)
ALT: 110 U/L — ABNORMAL HIGH (ref 17–63)
ANION GAP: 7 (ref 5–15)
AST: 103 U/L — ABNORMAL HIGH (ref 15–41)
BILIRUBIN TOTAL: 0.9 mg/dL (ref 0.3–1.2)
BUN: 20 mg/dL (ref 6–20)
CALCIUM: 7.9 mg/dL — AB (ref 8.9–10.3)
CO2: 25 mmol/L (ref 22–32)
Chloride: 101 mmol/L (ref 101–111)
Creatinine, Ser: 0.61 mg/dL (ref 0.61–1.24)
GFR calc non Af Amer: >60 mL/min (ref >60)
GLUCOSE: 177 mg/dL — AB (ref 65–99)
POTASSIUM: 4.2 mmol/L (ref 3.5–5.1)
SODIUM: 133 mmol/L — AB (ref 135–145)
TOTAL PROTEIN: 6.5 g/dL (ref 6.5–8.1)

## 2016-07-14 LAB — I-STAT CG4 LACTIC ACID, ED
Lactic Acid, Venous: 2.56 mmol/L (ref 0.5–1.9)
Lactic Acid, Venous: 2.44 mmol/L (ref 0.5–1.9)

## 2016-07-14 LAB — PROTIME-INR
INR: 1.58
PROTHROMBIN TIME: 19 s — AB (ref 11.4–15.2)

## 2016-07-14 LAB — OCCULT BLOOD X 1 CARD TO LAB, STOOL: FECAL OCCULT BLD: POSITIVE — AB

## 2016-07-14 LAB — LACTIC ACID, PLASMA
LACTIC ACID, VENOUS: 1.5 mmol/L (ref 0.5–1.9)
Lactic Acid, Venous: 2.4 mmol/L (ref 0.5–1.9)

## 2016-07-14 LAB — ETHANOL: Alcohol, Ethyl (B): <5 mg/dL (ref <5)

## 2016-07-14 LAB — I-STAT TROPONIN, ED: TROPONIN I, POC: 0 ng/mL (ref 0.00–0.08)

## 2016-07-14 LAB — LIPASE, BLOOD: Lipase: 19 U/L (ref 11–51)

## 2016-07-14 LAB — BRAIN NATRIURETIC PEPTIDE: B NATRIURETIC PEPTIDE 5: 144.3 pg/mL — AB (ref 0.0–100.0)

## 2016-07-14 MED ORDER — METOCLOPRAMIDE HCL 5 MG/ML IJ SOLN
10.0000 mg | Freq: Once | INTRAMUSCULAR | Status: AC
  Administered 2016-07-14: 10 mg via INTRAVENOUS

## 2016-07-14 MED ORDER — IOPAMIDOL (ISOVUE-300) INJECTION 61%
100.0000 mL | Freq: Once | INTRAVENOUS | Status: AC | PRN
  Administered 2016-07-14: 80 mL via INTRAVENOUS

## 2016-07-14 MED ORDER — FENTANYL CITRATE (PF) 100 MCG/2ML IJ SOLN
50.0000 ug | Freq: Once | INTRAMUSCULAR | Status: AC
  Administered 2016-07-14: 50 ug via INTRAVENOUS
  Filled 2016-07-14: qty 2

## 2016-07-14 MED ORDER — PANTOPRAZOLE SODIUM 40 MG PO TBEC: 80.0000 mg | DELAYED_RELEASE_TABLET | Freq: Every day | ORAL | Status: DC

## 2016-07-14 MED ORDER — ONDANSETRON HCL 4 MG/2ML IJ SOLN: 4.0000 mg | Freq: Three times a day (TID) | INTRAMUSCULAR | Status: DC | PRN

## 2016-07-14 MED ORDER — ASPIRIN 81 MG PO CHEW
324.0000 mg | CHEWABLE_TABLET | Freq: Once | ORAL | Status: DC
  Filled 2016-07-14: qty 4

## 2016-07-14 MED ORDER — SODIUM CHLORIDE 0.9 % IV BOLUS (SEPSIS)
1000.0000 mL | Freq: Once | INTRAVENOUS | Status: AC
  Administered 2016-07-14: 1000 mL via INTRAVENOUS

## 2016-07-14 MED ORDER — PANTOPRAZOLE SODIUM 40 MG PO TBEC: 40.0000 mg | DELAYED_RELEASE_TABLET | Freq: Two times a day (BID) | ORAL | Status: DC

## 2016-07-14 MED ORDER — ACETAMINOPHEN 325 MG PO TABS
650.0000 mg | ORAL_TABLET | Freq: Four times a day (QID) | ORAL | Status: DC | PRN
Start: 2016-07-14 — End: 2016-07-17

## 2016-07-14 MED ORDER — SODIUM CHLORIDE 0.9 % IV SOLN
INTRAVENOUS | Status: DC
  Administered 2016-07-14 – 2016-07-15 (×2): via INTRAVENOUS

## 2016-07-14 MED ORDER — FAMOTIDINE 200 MG/20ML IV SOLN: 40.0000 mg | Freq: Two times a day (BID) | INTRAVENOUS | Status: DC

## 2016-07-14 MED ORDER — METOCLOPRAMIDE HCL 5 MG/ML IJ SOLN
10.0000 mg | Freq: Once | INTRAMUSCULAR | Status: DC
  Filled 2016-07-14: qty 2

## 2016-07-14 MED ORDER — PANTOPRAZOLE SODIUM 40 MG IV SOLR
40.0000 mg | Freq: Two times a day (BID) | INTRAVENOUS | Status: DC
  Administered 2016-07-14 – 2016-07-17 (×6): 40 mg via INTRAVENOUS
  Filled 2016-07-14 (×6): qty 40

## 2016-07-14 MED ORDER — PANTOPRAZOLE SODIUM 40 MG PO TBEC
80.0000 mg | DELAYED_RELEASE_TABLET | Freq: Every day | ORAL | Status: DC
  Administered 2016-07-14: 80 mg via ORAL
  Filled 2016-07-14: qty 2

## 2016-07-14 MED ORDER — ACETAMINOPHEN 650 MG RE SUPP: 650.0000 mg | Freq: Four times a day (QID) | RECTAL | Status: DC | PRN

## 2016-07-14 MED ORDER — GI COCKTAIL ~~LOC~~
30.0000 mL | Freq: Once | ORAL | Status: AC
  Administered 2016-07-14: 30 mL via ORAL
  Filled 2016-07-14: qty 30

## 2016-07-14 MED ORDER — MORPHINE SULFATE (PF) 2 MG/ML IV SOLN
2.0000 mg | INTRAVENOUS | Status: DC | PRN
  Administered 2016-07-14 (×3): 2 mg via INTRAVENOUS
  Filled 2016-07-14 (×4): qty 1

## 2016-07-14 MED ORDER — SODIUM CHLORIDE 0.9 % IV SOLN
INTRAVENOUS | Status: AC
  Administered 2016-07-14 (×2): via INTRAVENOUS

## 2016-07-14 NOTE — Progress Notes (Signed)
Dr. Tasia Catchings made aware of positive fecal occult.

## 2016-07-14 NOTE — ED Notes (Signed)
Pt aware that urine specimen is needed  

## 2016-07-14 NOTE — ED Notes (Signed)
Unsuccessful attempt to call report to 5W.  RN to call back.

## 2016-07-14 NOTE — H&P (Signed)
Date: 07/14/2016               Patient Name:  Dan [name] MRN: [id number]  DOB: [birth year] Age / Sex: 45 y.o., male   PCP: No primary care provider on file.         Medical Service: Internal Medicine Teaching Service         Attending Physician: Dr. Axel Filler, MD    First Contact: Welford Roche, MS4 Pager: [phone]  Second Contact: Dr. Ophelia Shoulder Pager: [phone]       After Hours (After 5p/  First Contact Pager: [phone]  weekends / holidays): Second Contact Pager: [phone]   Chief Complaint: Abdominal pain   History of Present Illness:  Mr. Dan Pavek. [name] is a 45 y.o. male with history of liver cirrhosis and IV drug abuse who presents with abdominal pain of 4-5 days duration. The pain is sharp and located in the center of the abdomen. It is 7-8/10. He states that it has been progressively worsening. Pain is worse with movement and he has not identified any alleviating factors. He reports 2 episodes of hematemesis and 1 episode of melena yesterday. Denies any similar episodes in the past. He endorses mild anorexia, weakness, and fatigue. He also reports feet swelling that started approximately 1 week ago. Denies difficulty with ambulation.   Of note, patient denied prior knowledge of liver cirrhosis diagnosis. He also denies accumulation of abdominal fluid in the past. Has never had a paracentesis.   When asked the type of IV drugs he uses, he responded "all you can think of." States he last injected himself a few days ago, but did not specified number of days.   He denies fever, chills, night sweats, chest pain, SOB, cough, dysphagia,  changes in frequency of bowel movement, hematochezia, and changes in weight.   He initially presented to the ED with chief complaint of chest pain, which prompted a cardiac workup including EKG and cardiac biomarkers. See below for lab/studies results.   ROS: A complete ROS was negative except as per HPI.   PMH: Patient denies  any other medical problems. However, per EMR:  HTN  Chronic pain Nephrolithiasis Opiate dependence  Depression  Hx of cocaine abuse   Meds:  None   PSH:  R thoracotomy with drainage of empyema and decortication of R lung 06/11/2006  Allergies: NKDA  Family History:  Denies family history of liver disease, heart disease, and cancer.   Social History:  He lives at home with his mom. He is on disability. Denied alcohol use. Reports 0.5ppd for the past 25 years. Also reports long history of IV drug abuse. Last use was "a few days ago."   Physical Exam: Blood pressure 136/82, pulse 62, temperature 97.9 F (36.6 C), temperature source Oral, resp. rate 18, height '5\' 11"'  (1.803 m), weight 74.3 kg (163 lb 12.8 oz), SpO2 98 %.  General: patient looked disheveled and unkempt, comfortable while resting in bed, in NAD  Head: normocephalic and atraumatic  Eyes: anicteric sclera, EOMI Neck: supple, no lymphadenopathy  Mouth: poor dentition, OP clear  CV: RRR, nl S1/S2, no m/r/g, no JVD appreciated  Chest: CTAB, no wheezes or crackles, no increased work of breathing  Abd: distended and very tender to light palpation, dull to percussion, hepatomegaly noted 2cm below costal margin, unable to assess for splenomegaly due to pain, +BS, no rebound or guarding  GU: nl rectal tone, no external hemorrhoids noted  Extremities: trace-1+  LE pitting edema bilaterally, no cyanosis or clubbing  MSK: normal bulk and tone x4  Skin: no spider angiomata or bruising appreciated, no jaundice or palmar erythema, multiple scrapes in lower extremities  Neuro: A&O x3, no focal deficits, no asterixis noted  Psych: appropriate affect   Lab/Studies:    Ref. Range 07/14/2016 06:40  Sodium Latest Ref Range: 135 - 145 mmol/L 133 (L)  Potassium Latest Ref Range: 3.5 - 5.1 mmol/L 4.2  Chloride Latest Ref Range: 101 - 111 mmol/L 101  CO2 Latest Ref Range: 22 - 32 mmol/L 25  BUN Latest Ref Range: 6 - 20 mg/dL 20    Creatinine Latest Ref Range: 0.61 - 1.24 mg/dL 0.61  Calcium Latest Ref Range: 8.9 - 10.3 mg/dL 7.9 (L)  EGFR (Non-African Amer.) Latest Ref Range: >60 mL/min >60  EGFR (African American) Latest Ref Range: >60 mL/min >60  Glucose Latest Ref Range: 65 - 99 mg/dL 177 (H)  Anion gap Latest Ref Range: 5 - 15  7  Alkaline Phosphatase Latest Ref Range: 38 - 126 U/L 131 (H)  Albumin Latest Ref Range: 3.5 - 5.0 g/dL 2.1 (L)  Lipase Latest Ref Range: 11 - 51 U/L 19  AST Latest Ref Range: 15 - 41 U/L 103 (H)  ALT Latest Ref Range: 17 - 63 U/L 110 (H)  Total Protein Latest Ref Range: 6.5 - 8.1 g/dL 6.5  Total Bilirubin Latest Ref Range: 0.3 - 1.2 mg/dL 0.9     Ref. Range 07/14/2016 06:40  WBC Latest Ref Range: 4.0 - 10.5 K/uL 11.8 (H)  RBC Latest Ref Range: 4.22 - 5.81 MIL/uL 4.26  Hemoglobin Latest Ref Range: 13.0 - 17.0 g/dL 13.4  HCT Latest Ref Range: 39.0 - 52.0 % 40.5  MCV Latest Ref Range: 78.0 - 100.0 fL 95.1  MCH Latest Ref Range: 26.0 - 34.0 pg 31.5  MCHC Latest Ref Range: 30.0 - 36.0 g/dL 33.1  RDW Latest Ref Range: 11.5 - 15.5 % 14.4  Platelets Latest Ref Range: 150 - 400 K/uL 62 (L)    Ref. Range 07/14/2016 06:40  Neutrophils Latest Units: % 86  Lymphocytes Latest Units: % 6  Monocytes Relative Latest Units: % 7  Eosinophil Latest Units: % 0  Basophil Latest Units: % 0  NEUT# Latest Ref Range: 1.7 - 7.7 K/uL 10.2 (H)  Lymphocyte # Latest Ref Range: 0.7 - 4.0 K/uL 0.7  Monocyte # Latest Ref Range: 0.1 - 1.0 K/uL 0.8  Eosinophils Absolute Latest Ref Range: 0.0 - 0.7 K/uL 0.0  Basophils Absolute Latest Ref Range: 0.0 - 0.1 K/uL 0.0     Ref. Range 07/14/2016 08:17  Amphetamines Latest Ref Range: NONE DETECTED  NONE DETECTED  Barbiturates Latest Ref Range: NONE DETECTED  NONE DETECTED  Benzodiazepines Latest Ref Range: NONE DETECTED  POSITIVE (A)  Opiates Latest Ref Range: NONE DETECTED  NONE DETECTED  COCAINE Latest Ref Range: NONE DETECTED  NONE DETECTED  Tetrahydrocannabinol  Latest Ref Range: NONE DETECTED  POSITIVE (A)   PT/INR  19.0/1.58 BNP: 144.3 Troponin 0.00 x1 Lactic acid: 2.56 Alcohol, ethyl: <3m/dL   EKG 07/14/2016: On my review: HR 87, sinus rhythm, normal axis, ST depression in lead II, no T wave inversion or ST segment elevation, short PR interval, QTc 431   CXR PA & Lat 07/14/2016: On my review: there is no tracheal deviation, no enlarged cardiac silhouette, elevated diaphragms, no opacities or consolidations.   FINDINGS: There is stable scarring in the right upper lobe region. There is bibasilar  atelectasis. There is no appreciable airspace consolidation. Heart size and pulmonary vascularity are normal. No adenopathy. Bony bridging between the right posterior fifth and sixth ribs is again noted. IMPRESSION: Bibasilar atelectasis. Stable scarring right upper lobe. No airspace consolidation. Stable cardiac silhouette.  A/P CT 07/14/2016:  FINDINGS: Lower chest: Mild atelectasis in the dependent lung bases bilaterally. No effusion. Heart size within normal limits. Hepatobiliary: Enlargement of the left lobe liver. Irregular contour of the liver capsule. Findings consistent with cirrhosis. No mass lesion. Gallbladder is markedly distended without gallbladder Trueheart thickening. No biliary dilatation Pancreas: Normal pancreas.  Negative for pancreatitis. Spleen: Splenomegaly. Splenic volume 800 mL. No focal splenic lesion or infarction. Adrenals/Urinary Tract: 4 mm right upper pole calculus. 3 mm right lower pole calculus. No renal obstruction or mass. Normal bladder. Stomach/Bowel: Marked mucosal edema throughout the stomach diffusely. Mucosal edema in the distal esophagus. Probable varices at the GE junction. No other bowel edema. Negative for bowel obstruction or ileus. Appendix not visualized. Vascular/Lymphatic: Mild atherosclerotic calcification in the aorta without aneurysm. No lymphadenopathy. IVC normal. Reproductive: Mild prostate calcification  and mild prostate enlargement Other: Large amount of ascites in all 4 quadrants. Findings compatible with cirrhosis. Musculoskeletal: Lumbar disc degeneration. No acute skeletal abnormality.  IMPRESSION: Cirrhosis of the liver with splenomegaly, portal hypertension, and probable esophageal varices. Large amount of ascites. Extensive mucosal edema involving the stomach. This may be due to ascites and portal hypertension however gastritis could also be present. There is also mucosal edema in the distal esophagus.  Assessment & Plan by Problem: Active Problems:   Cirrhosis ([place])   Abdominal pain  Mr. Dicaprio is a 45 yo male with history of liver cirrhosis and current IV drug use who present with worsening abdominal pain for the past 4-5 days, hematemesis x2, melena x1, anorexia, and LE edema. At this time, etiology of his abdominal pain is unclear. Differential diagnosis and workup described below by problem.   1. Abdominal pain: Patient presents with 4-5 of worsening abdominal pain associated with 2 episodes of hematemesis and 1 episode of melena, but no changes in frequency of BMs. His abdominal exam is significant for severe tenderness to palpation on all 4 quadrants, notable distention, dullness to percussion, and hepatomegaly. His WBC count is mildly elevated to 11.8 and he has evidence of extensive mucosal edema of stomach and distal esophagus on abdominal CT. Differential diagnosis includes gastritis (new abdominal pain associated with emesis, and extensive mucosal edema of stomach. He denies alcohol and NSAID use, but could have an alternative etiology to it such as PUD ) vs. variceal bleed (hx of cirrhosis with evidence esophageal varices on CT) PUD (new abdominal pain associated with hematemesis and melena. However, pain does not improve or worsen with meals. He denies alcohol and NSAID use, but reports heavy smoking) vs. abdominal pain 2/2 ascites 2/2 cirrhosis (Patient has history of liver  cirrhosis and has noticed an increase in abdominal girth. However, the amount of ascites on CT and [place] is not significant. Expansion of liver capsule can also cause pain, however this is a chronic process and the patient's presentation is acute) vs. SBP (leukocytosis and abdominal pain in the setting of liver cirrhosis with increased abdominal girth, but would expect a larger amount of ascites on imaging, low suspicion at this time) vs. SBO (abdominal pain with mild anorexia, but no changes in BMs other than melena). - 67m PO Protonix BID --> IV Protonix 448mBID   - 20m44morphine q4h PRN and  tylenol 660m q6h PRN for pain control  - NPO  - Will plan to consult GI in the AM for possible EGD    2. Hematemesis: Patient reports 2 episodes of hematemesis yesterday. No emesis today. H/H stable. No concerns for active bleeding at this time. Given acuity of presentation, there is concern for upper GI bleeding 2/2 gastritis vs. PUD.  - Will hold on antibiotic prophylaxis given no active bleeding at this time  - NPO  - Zofran PRN  - Management of abdominal pain as above   3. Melena: Patient reports only 1 episode of melena yesterday. FOBT positive. Suspect this is secondary to upper GI bleed. No BRBPR and no abnormalities on rectal exam so low suspicion for a lower GI bleed at this time.  - Continue to monitor - Management as above   4. Leukocytosis: WBC 11.8 on admission., currently 11.0.  - Will continue to monitor with CBC BID   5. Thrombocytopenia: Platelet count 62 on admission. This is very likely secondary to patient's liver cirrhosis.  He is hemodynamically stable without active bleeding.  - CBC as above   6. Hyponatremia: Mild. In the setting of poor PO intake and liver cirrhosis.  - NS @ 150cc/hr - F/u CMP tomorrow   7.  Elevated lactic acid: 2.56 on admission, currently downtrending. In the setting of poor PO intake.  - s/p 1L bolus NS - IVF as above    8. Liver cirrhosis: Child-Pugh  Score 8 (Child Class B). Patient unaware of this diagnosis. Suspect this is possibly due to chronic Hep C infection given his long history of IV drug use.  - f/u Hep panel   9. IV drug abuse: Patient reports last use a few days ago. UDS positive for benzodiazepines and THC.  - CIWA protocol   10. DVT ppx: SCDs   Dispo: Admit patient to Inpatient with expected length of stay greater than 2 midnights.  Signed: IWelford Roche Medical Student 07/14/2016, 4:03 PM  Pager: [phone]

## 2016-07-14 NOTE — ED Provider Notes (Signed)
[facility]-EMERGENCY DEPT Provider Note   CSN: [id number] Arrival date & time: 07/14/16  0608  First Provider Contact:  First MD Initiated Contact with Patient 07/14/16 [phone]        History   Chief Complaint Chief Complaint  Patient presents with  . Chest Pain  . Abdominal Pain    HPI Dan [name] is a 45 y.o. male with a past medical history of chronic polysubstance abuse including IVDU. Patient uses iv cocaine and opana. He presents with complaint of "pain everywhere" . The patient gives various histories to myself, my student, and the attending physician. Patient states that he has noticed significant swelling in his lower extremities and abdomen over the past 2-3 days. He has had nausea and vomiting over the past 2 days. He complains of chest pain that started last night, followed by 2 episodes of vomiting about a cup of bright red blood. He states that he made a small bowel movement yesterday and does not usually have constipation. He has not used any IV drugs today but uses almost daily and has used both cocaine and upon her over the past week. He complains of pain in the left chest and retrosternal area which she describes as burning and sharp. It is nonpleuritic and nonexertional. He complains of pain all over his abdomen.    HPI  History reviewed. No pertinent past medical history.  Patient Active Problem List   Diagnosis Date Noted  . Upper GI bleeding 07/15/2016  . Cirrhosis (HCC) 07/14/2016  . Abdominal pain 07/14/2016  . Esophageal varices without bleeding (HCC)   . Gastritis   . Hematemesis without nausea     History reviewed. No pertinent surgical history.     Home Medications    Prior to Admission medications   Not on File    Family History No family history on file.  Social History Social History  Substance Use Topics  . Smoking status: Current Every Day Smoker    Packs/day: 0.50    Types: Cigarettes  . Smokeless tobacco: Not on file  . Alcohol use Yes      Allergies   Review of patient's allergies indicates no known allergies.   Review of Systems Review of Systems Ten systems reviewed and are negative for acute change, except as noted in the HPI.    Physical Exam Updated Vital Signs BP 115/80 (BP Location: Left Arm)   Pulse (!) 55   Temp 97.5 F (36.4 C) (Oral)   Resp 18   Ht  (1.803 m)   Wt 74.3 kg   SpO2 97%   BMI 22.85 kg/m   Physical Exam  Constitutional: He is oriented to person, place, and time. No distress.  Chronically ill appearing man. He is unkempt and filthy appearing.  He has multiple scabs, fresh track marks and skin popping marks on his legs, feet, arms.  HENT:  Head: Normocephalic and atraumatic.  Patient with poor dentition, most of his teeth or decayed to the gumline, mouth and gingiva are extremely inflamed and erythematous appearing.  Eyes: EOM are normal. Pupils are equal, round, and reactive to light.  Neck: Normal range of motion. Neck supple. No JVD present.  Cardiovascular: Normal rate, regular rhythm, normal heart sounds and intact distal pulses.  Exam reveals no gallop and no friction rub.   No murmur heard. Pitting edema 3+ in the feet to the shin.  Pulmonary/Chest: Effort normal and breath sounds normal. No respiratory distress. He has no wheezes. He  has no rales.  Abdominal: Soft. He exhibits distension. There is tenderness. There is guarding. A hernia is present.  Patient with swollen, distended abdomen, ventral hernia present with Valsalva and guarding. Exquisitely tender to palpation throughout. Abdomen.  Musculoskeletal: Normal range of motion.  Neurological: He is alert and oriented to person, place, and time.  Skin: Capillary refill takes less than 2 seconds. He is not diaphoretic.  Sallow discoloration of the skin  Nursing note and vitals reviewed.    ED Treatments / Results  Labs (all labs ordered are listed, but only abnormal results are displayed) Labs Reviewed    COMPREHENSIVE METABOLIC PANEL - Abnormal; Notable for the following:       Result Value   Sodium 133 (*)    Glucose, Bld 177 (*)    Calcium 7.9 (*)    Albumin 2.1 (*)    AST 103 (*)    ALT 110 (*)    Alkaline Phosphatase 131 (*)    All other components within normal limits  URINE RAPID DRUG SCREEN, HOSP PERFORMED - Abnormal; Notable for the following:    Benzodiazepines POSITIVE (*)    Tetrahydrocannabinol POSITIVE (*)    All other components within normal limits  CBC WITH DIFFERENTIAL/PLATELET - Abnormal; Notable for the following:    WBC 11.8 (*)    Platelets 62 (*)    Neutro Abs 10.2 (*)    All other components within normal limits  PROTIME-INR - Abnormal; Notable for the following:    Prothrombin Time 19.0 (*)    All other components within normal limits  BRAIN NATRIURETIC PEPTIDE - Abnormal; Notable for the following:    B Natriuretic Peptide 144.3 (*)    All other components within normal limits  LACTIC ACID, PLASMA - Abnormal; Notable for the following:    Lactic Acid, Venous 2.4 (*)    All other components within normal limits  HEPATITIS PANEL, ACUTE - Abnormal; Notable for the following:    Hepatitis B Surface Ag Positive (*)    HCV Ab >11.0 (*)    All other components within normal limits  OCCULT BLOOD X 1 CARD TO LAB, STOOL - Abnormal; Notable for the following:    Fecal Occult Bld POSITIVE (*)    All other components within normal limits  CBC - Abnormal; Notable for the following:    WBC 11.0 (*)    RBC 4.16 (*)    Platelets 55 (*)    All other components within normal limits  COMPREHENSIVE METABOLIC PANEL - Abnormal; Notable for the following:    Glucose, Bld 126 (*)    Calcium 7.6 (*)    Total Protein 5.7 (*)    Albumin 1.8 (*)    AST 69 (*)    ALT 79 (*)    All other components within normal limits  CBC - Abnormal; Notable for the following:    WBC 10.8 (*)    RBC 4.00 (*)    Hemoglobin 12.3 (*)    HCT 37.9 (*)    Platelets 61 (*)    All other  components within normal limits  I-STAT CG4 LACTIC ACID, ED - Abnormal; Notable for the following:    Lactic Acid, Venous 2.56 (*)    All other components within normal limits  I-STAT CG4 LACTIC ACID, ED - Abnormal; Notable for the following:    Lactic Acid, Venous 2.44 (*)    All other components within normal limits  ETHANOL  LIPASE, BLOOD  HIV ANTIBODY (ROUTINE TESTING)  LACTIC ACID, PLASMA  FERRITIN  IRON AND TIBC  HEMOGLOBIN  HEPATITIS B SURFACE ANTIBODY  HEPATITIS B SURFACE ANTIGEN  HEPATITIS B CORE ANTIBODY, TOTAL  ANTINUCLEAR ANTIBODIES, IFA  ANTI-SMOOTH MUSCLE ANTIBODY, IGG  AFP TUMOR MARKER  IGG  ALPHA-1-ANTITRYPSIN  CERULOPLASMIN  I-STAT TROPOININ, ED  I-STAT TROPOININ, ED  I-STAT CG4 LACTIC ACID, ED    EKG  EKG Interpretation  Date/Time:  Tuesday July 14 2016 06:09:48 EDT Ventricular Rate:  87 PR Interval:    QRS Duration: 90 QT Interval:  358 QTC Calculation: 431 R Axis:   88 Text Interpretation:  Sinus rhythm Short PR interval Confirmed by Wilkie Aye  MD, Toni Amend ([id number]) on 07/14/2016 6:12:38 AM Also confirmed by Wilkie Aye  MD, COURTNEY ([id number]), editor Valentina Lucks CT, [place] [phone])  on 07/14/2016 6:56:10 AM       Radiology Dg Chest 2 View  Result Date: 07/14/2016 CLINICAL DATA:  Chest pain EXAM: CHEST  2 VIEW COMPARISON:  November 21, 2015 FINDINGS: There is stable scarring in the right upper lobe region. There is bibasilar atelectasis. There is no appreciable airspace consolidation. Heart size and pulmonary vascularity are normal. No adenopathy. Bony bridging between the right posterior fifth and sixth ribs is again noted. IMPRESSION: Bibasilar atelectasis. Stable scarring right upper lobe. No airspace consolidation. Stable cardiac silhouette. Electronically Signed   By: Bretta Bang III M.D.   On: 07/14/2016 07:02   Ct Abdomen Pelvis W Contrast  Result Date: 07/14/2016 CLINICAL DATA:  Abdominal pain with nausea vomiting EXAM: CT ABDOMEN AND PELVIS WITH CONTRAST  TECHNIQUE: Multidetector CT imaging of the abdomen and pelvis was performed using the standard protocol following bolus administration of intravenous contrast. CONTRAST:  80mL ISOVUE-300 IOPAMIDOL (ISOVUE-300) INJECTION 61% COMPARISON:  None. FINDINGS: Lower chest: Mild atelectasis in the dependent lung bases bilaterally. No effusion. Heart size within normal limits. Hepatobiliary: Enlargement of the left lobe liver. Irregular contour of the liver capsule. Findings consistent with cirrhosis. No mass lesion. Gallbladder is markedly distended without gallbladder Uphoff thickening. No biliary dilatation Pancreas: Normal pancreas.  Negative for pancreatitis. Spleen: Splenomegaly. Splenic volume 800 mL. No focal splenic lesion or infarction. Adrenals/Urinary Tract: 4 mm right upper pole calculus. 3 mm right lower pole calculus. No renal obstruction or mass. Normal bladder. Stomach/Bowel: Marked mucosal edema throughout the stomach diffusely. Mucosal edema in the distal esophagus. Probable varices at the GE junction. No other bowel edema. Negative for bowel obstruction or ileus. Appendix not visualized. Vascular/Lymphatic: Mild atherosclerotic calcification in the aorta without aneurysm. No lymphadenopathy. IVC normal. Reproductive: Mild prostate calcification and mild prostate enlargement Other: Large amount of ascites in all 4 quadrants. Findings compatible with cirrhosis. Musculoskeletal: Lumbar disc degeneration. No acute skeletal abnormality. IMPRESSION: Cirrhosis of the liver with splenomegaly, portal hypertension, and probable esophageal varices. Large amount of ascites. Extensive mucosal edema involving the stomach. This may be due to ascites and portal hypertension however gastritis could also be present. There is also mucosal edema in the distal esophagus. Electronically Signed   By: Marlan Palau M.D.   On: 07/14/2016 11:07    Procedures Procedures (including critical care time)  Medications Ordered in  ED Medications  acetaminophen (TYLENOL) tablet 650 mg (not administered)    Or  acetaminophen (TYLENOL) suppository 650 mg (not administered)  ondansetron (ZOFRAN) injection 4 mg (not administered)  pantoprazole (PROTONIX) injection 40 mg (40 mg Intravenous Given 07/15/16 0924)  cefTRIAXone (ROCEPHIN) 1 g in dextrose 5 % 50 mL IVPB (1 g Intravenous Given 07/15/16 1013)  octreotide (SANDOSTATIN)  2 mcg/mL load via infusion 50 mcg (50 mcg Intravenous Bolus from Bag 07/15/16 1214)    And  octreotide (SANDOSTATIN) 500 mcg in sodium chloride 0.9 % 250 mL (2 mcg/mL) infusion (50 mcg/hr Intravenous Rate/Dose Change 07/15/16 1215)  dextrose 5 %-0.45 % sodium chloride infusion ( Intravenous New Bag/Given 07/15/16 1206)  morphine 4 MG/ML injection 4 mg (4 mg Intravenous Given 07/15/16 1630)  sodium chloride 0.9 % bolus 1,000 mL (0 mLs Intravenous Stopped 07/14/16 0936)  fentaNYL (SUBLIMAZE) injection 50 mcg (50 mcg Intravenous Given 07/14/16 0846)  metoCLOPramide (REGLAN) injection 10 mg (10 mg Intravenous Given 07/14/16 0855)  iopamidol (ISOVUE-300) 61 % injection 100 mL (80 mLs Intravenous Contrast Given 07/14/16 1039)  0.9 %  sodium chloride infusion ( Intravenous New Bag/Given 07/14/16 1841)  gi cocktail (Maalox,Lidocaine,Donnatal) (30 mLs Oral Given 07/14/16 1217)     Initial Impression / Assessment and Plan / ED Course  I have reviewed the triage vital signs and the nursing notes.  Pertinent labs & imaging results that were available during my care of the patient were reviewed by me and considered in my medical decision making (see chart for details).  Clinical Course  Value Comment By Time  DG Chest 2 View Atelectasis and old scarring. No mediastinal widening or free air. Arthor Captain, PA-C 08/01 [phone]  ED EKG Ekg is without concern for ischemia. Arthor Captain, PA-C 08/01 0720  Lactic Acid, Venous: (!!) 2.56 Lactic acid is 2.56, however the patient does not meet Sepsis criteria Arthor Captain, PA-C 08/01 0804    I HAVE SPOKEN TO INTERNAL MEDICINE WHO WILL ADMIT THE PATIENT.  Final Clinical Impressions(s) / ED Diagnoses   Final diagnoses:  Cirrhosis of liver with ascites, unspecified hepatic cirrhosis type (HCC)  Gastritis  Esophageal varices without bleeding, unspecified esophageal varices type (HCC)  Hematemesis without nausea    New Prescriptions There are no discharge medications for this patient.    Arthor Captain, PA-C 07/15/16 1636    Gwyneth Sprout, MD 07/16/16 [phone]

## 2016-07-14 NOTE — ED Triage Notes (Signed)
Pt reports he began having chest pain/abdominal  two days ago, at that time he reports his feet and stomach became swollen.  Reports hx of injecting opana, smoking and "part of my lung taken out."

## 2016-07-14 NOTE — ED Notes (Signed)
Patient in decon shower.  Room has been bleached.  All patient's clothes have been double bagged and linens were double bagged and taken out of room.

## 2016-07-14 NOTE — Progress Notes (Signed)
Report received from Amy, RN for admission to [phone]

## 2016-07-14 NOTE — ED Notes (Signed)
2 bugs found on patient and placed in a cup for environmental/terminix.  Patient arm bagged and he will go to shower before he can go to CT.

## 2016-07-14 NOTE — Progress Notes (Addendum)
TAYGEN ROBLEDO is a 45 y.o. male patient admitted from ED awake, alert - oriented  X 4 - no acute distress noted.  VSS - Blood pressure 136/82, pulse 62, temperature 97.9 F (36.6 C), temperature source Oral, resp. rate 18, height 5\' 11"  (1.803 m), weight 74.3 kg (163 lb 12.8 oz), SpO2 98 %.    IV in place, occlusive dsg intact without redness.  Orientation to room, and floor completed with information packet given to patient/family.  Patient declined safety video at this time.  Admission INP armband ID verified with patient/family, and in place.   SR up x 2, fall assessment complete, with patient and family able to verbalize understanding of risk associated with falls, and verbalized understanding to call nsg before up out of bed.  Call light within reach, patient able to voice, and demonstrate understanding.     Scabs noted bilateral legs and arms. Patient placed on contact precautions for bed bug report from ED.   MD paged to make aware patient arrived to room and MD stated they will come assess patient.    Will cont to eval and treat per MD orders.  Kendall Flack, RN 07/14/2016 12:45 PM

## 2016-07-14 NOTE — ED Notes (Signed)
Charge RN notified, environmental at bedside to determine what kind of bug.

## 2016-07-14 NOTE — ED Notes (Signed)
Patient placed in decontamination shower at this time and cleaned for possible bed bugs.

## 2016-07-14 NOTE — Progress Notes (Signed)
Pt last reading for lactic acid at 1522 was 2.4. No new orders for lactic acid nor continuous fluids in chart. MD paged at 2100. New order for NS 182ml/hr placed and order fr lactic acid

## 2016-07-14 NOTE — Progress Notes (Signed)
MD paged to make aware of lactic acid level 2.4

## 2016-07-14 NOTE — ED Notes (Signed)
PA instructed RN to hold ASA

## 2016-07-14 NOTE — ED Notes (Signed)
Patient transported to X-ray 

## 2016-07-14 NOTE — Progress Notes (Signed)
Patient stating he is feeling weak and asking if orthostatic VS have to be taken now. Dr. Oswaldo Done at bedside and stated to take orthostatic VS tomorrow.

## 2016-07-14 NOTE — H&P (Signed)
Date: 07/14/2016               Patient Name:  Dan [name] MRN: [id number]  DOB: 03-31-1971 Age / Sex: 45 y.o., male   PCP: No primary care provider on file.         Medical Service: Internal Medicine Teaching Service         Attending Physician: Dr. Tyson Alias, MD    First Contact: Dan [name], [place] Pager: [phone]  Second Contact: Dr. Thomasene Lot, MD Pager: [phone]       After Hours (After 5p/  First Contact Pager: [phone]  weekends / holidays): Second Contact Pager: [phone]   Chief Complaint: Abdominal pain  History of present illness: Mr. walls a 45 year old male with a history of liver cirrhosis and IV drug abuse who presents with abdominal pain for 4-5 days. The pain is primarily located in the midepigastric region and sharp in nature. The patient states that this pain has been progressively worsening over the last 3 days. His pain is worsened with movement and has not been improved by anything. Additionally, he reports 2 episodes of hematemesis and one episode of a dark stool yesterday. He has not had similar episodes in the past. He also endorses anorexia, fatigue and weakness. He denies new headaches, change in vision, chest pain, shortness of breath or changes with his bowel or bladder habits.  The patient denies any knowledge of liver cirrhosis. He reports that his most recent IV drug use was approximately 4 days ago.  He initially presented to the emergency department with a chief complaint of chest pain and abdominal pain prompting a workup to rule out cardiac etiology.    Meds:  No prescriptions prior to admission.     Allergies: Allergies as of 07/14/2016  . (No Known Allergies)   History reviewed. No pertinent past medical history.  Family History: Denies family history of liver disease, heart disease or cancer.  Social History: Currently living at home with his mom. He is on disability. He denies alcohol use. Reports half a pack per day  tobacco use for approximately 25 years and endorses IV drug abuse a few days ago.  Review of Systems: A complete ROS was negative except as per HPI.   Physical Exam: Blood pressure 136/82, pulse 62, temperature 97.9 F (36.6 C), temperature source Oral, resp. rate 18, height 5\' 11"  (1.803 m), weight 163 lb 12.8 oz (74.3 kg), SpO2 98 %.   Physical Exam  Constitutional: He is oriented to person, place, and time. He appears well-developed and well-nourished.  Patient appears to be in acute distress and in pain.  HENT:  Head: Normocephalic and atraumatic.  Cardiovascular: Normal rate, regular rhythm and normal heart sounds.  Exam reveals no gallop and no friction rub.   No murmur heard. Respiratory: Breath sounds normal. No respiratory distress. He has no wheezes. He has no rales.  GI: Bowel sounds are normal. There is tenderness. There is guarding.  Patient is most tender in the midepigastric region. However, the patient appeared to be in significant pain and demonstrated guarding upon palpation to all 4 abdominal quadrants.  Musculoskeletal: He exhibits edema.  Positive bilateral pedal edema.  Neurological: He is alert and oriented to person, place, and time.     Assessment & Plan by Problem:  Active Problems:   Cirrhosis (HCC)   Abdominal pain  Mr. walls a 45 year old male with a history of liver cirrhosis and IV drug use who  presents with worsening abdominal pain of 4-5 days, hematemesis and melena. Currently, the differential diagnosis includes gastritis, peptic ulcer disease and esophagitis. Additionally, the differential diagnosis includes spontaneous bacterial peritonitis although this is less likely given the small amount of ascites present on abdominal ultrasound and absence of fever and other symptoms.  1. Abdominal pain -- Bedside abdominal ultrasound demonstrated small amount of ascitic fluid -- CT demonstrates extensive mucosal edema involving the stomach -- Zofran 4 mg  IV when necessary for nausea -- Morphine 2 mg every 4 hours when necessary for pain control -- GI cocktail given in the emergency department  2. GI bleed, currently not active and stable -- Reports episodes of hematemesis 2, and melena.   -- Fecal occult blood test positive. -- Nothing by mouth -- Presence of esophageal varices on CT scan -- Pantoprazole 40 mg IV, twice a day -- We'll consult GI in the morning  3. Cirrhosis -- Nodularity and cirrhosis of the liver noted on CT scan -- Thrombocytopenia with a platelet count of 62 -- Hepatitis panel  4. Leukocytosis -- Patient has mild leukocytosis with a white blood cell count of 11.8 -- CBC twice a day  5. Elevated lactic acid -- 2.56 on admission, currently down trending -- IV fluids for maintenance  6. IV drug abuse -- CIWA protocol  7. DVT prophylaxis -- SCDs -- No anticoagulation given concern for GI bleed Dispo: Admit patient to Inpatient with expected length of stay greater than 2 midnights.  Signed: Thomasene Lot, MD 07/14/2016, 6:08 PM  Pager: [phone]

## 2016-07-14 NOTE — ED Notes (Signed)
Abigail PA at bedside   

## 2016-07-14 NOTE — ED Notes (Signed)
Called CT and spoke with Toni Amend to update that patient has been showered, linens changed and clothes have been changed.

## 2016-07-15 DIAGNOSIS — B1920 Unspecified viral hepatitis C without hepatic coma: Secondary | ICD-10-CM | POA: Diagnosis not present

## 2016-07-15 DIAGNOSIS — K259 Gastric ulcer, unspecified as acute or chronic, without hemorrhage or perforation: Secondary | ICD-10-CM | POA: Diagnosis not present

## 2016-07-15 DIAGNOSIS — B182 Chronic viral hepatitis C: Secondary | ICD-10-CM | POA: Diagnosis present

## 2016-07-15 DIAGNOSIS — D72829 Elevated white blood cell count, unspecified: Secondary | ICD-10-CM | POA: Diagnosis present

## 2016-07-15 DIAGNOSIS — F1120 Opioid dependence, uncomplicated: Secondary | ICD-10-CM | POA: Diagnosis present

## 2016-07-15 DIAGNOSIS — K7469 Other cirrhosis of liver: Secondary | ICD-10-CM | POA: Diagnosis present

## 2016-07-15 DIAGNOSIS — E8809 Other disorders of plasma-protein metabolism, not elsewhere classified: Secondary | ICD-10-CM | POA: Diagnosis present

## 2016-07-15 DIAGNOSIS — K7031 Alcoholic cirrhosis of liver with ascites: Secondary | ICD-10-CM | POA: Diagnosis not present

## 2016-07-15 DIAGNOSIS — K922 Gastrointestinal hemorrhage, unspecified: Secondary | ICD-10-CM | POA: Diagnosis not present

## 2016-07-15 DIAGNOSIS — D62 Acute posthemorrhagic anemia: Secondary | ICD-10-CM | POA: Diagnosis present

## 2016-07-15 DIAGNOSIS — I10 Essential (primary) hypertension: Secondary | ICD-10-CM | POA: Diagnosis present

## 2016-07-15 DIAGNOSIS — K254 Chronic or unspecified gastric ulcer with hemorrhage: Secondary | ICD-10-CM | POA: Diagnosis present

## 2016-07-15 DIAGNOSIS — K2970 Gastritis, unspecified, without bleeding: Secondary | ICD-10-CM | POA: Diagnosis present

## 2016-07-15 DIAGNOSIS — B181 Chronic viral hepatitis B without delta-agent: Secondary | ICD-10-CM | POA: Diagnosis present

## 2016-07-15 DIAGNOSIS — R1013 Epigastric pain: Secondary | ICD-10-CM | POA: Diagnosis not present

## 2016-07-15 DIAGNOSIS — F329 Major depressive disorder, single episode, unspecified: Secondary | ICD-10-CM | POA: Diagnosis present

## 2016-07-15 DIAGNOSIS — F1010 Alcohol abuse, uncomplicated: Secondary | ICD-10-CM | POA: Diagnosis present

## 2016-07-15 DIAGNOSIS — K7689 Other specified diseases of liver: Secondary | ICD-10-CM | POA: Diagnosis not present

## 2016-07-15 DIAGNOSIS — K766 Portal hypertension: Secondary | ICD-10-CM | POA: Diagnosis present

## 2016-07-15 DIAGNOSIS — F1410 Cocaine abuse, uncomplicated: Secondary | ICD-10-CM | POA: Diagnosis present

## 2016-07-15 DIAGNOSIS — D696 Thrombocytopenia, unspecified: Secondary | ICD-10-CM

## 2016-07-15 DIAGNOSIS — K920 Hematemesis: Secondary | ICD-10-CM | POA: Diagnosis present

## 2016-07-15 DIAGNOSIS — E871 Hypo-osmolality and hyponatremia: Secondary | ICD-10-CM | POA: Diagnosis present

## 2016-07-15 DIAGNOSIS — F17210 Nicotine dependence, cigarettes, uncomplicated: Secondary | ICD-10-CM | POA: Diagnosis present

## 2016-07-15 DIAGNOSIS — K3189 Other diseases of stomach and duodenum: Secondary | ICD-10-CM | POA: Diagnosis present

## 2016-07-15 DIAGNOSIS — R188 Other ascites: Secondary | ICD-10-CM | POA: Diagnosis present

## 2016-07-15 DIAGNOSIS — D6959 Other secondary thrombocytopenia: Secondary | ICD-10-CM | POA: Diagnosis present

## 2016-07-15 DIAGNOSIS — R1084 Generalized abdominal pain: Secondary | ICD-10-CM | POA: Diagnosis not present

## 2016-07-15 DIAGNOSIS — K7460 Unspecified cirrhosis of liver: Secondary | ICD-10-CM | POA: Diagnosis not present

## 2016-07-15 DIAGNOSIS — B1910 Unspecified viral hepatitis B without hepatic coma: Secondary | ICD-10-CM | POA: Diagnosis not present

## 2016-07-15 LAB — COMPREHENSIVE METABOLIC PANEL
ALK PHOS: 98 U/L (ref 38–126)
ALT: 79 U/L — AB (ref 17–63)
AST: 69 U/L — AB (ref 15–41)
Albumin: 1.8 g/dL — ABNORMAL LOW (ref 3.5–5.0)
Anion gap: 5 (ref 5–15)
BILIRUBIN TOTAL: 1.1 mg/dL (ref 0.3–1.2)
BUN: 18 mg/dL (ref 6–20)
CALCIUM: 7.6 mg/dL — AB (ref 8.9–10.3)
CO2: 24 mmol/L (ref 22–32)
CREATININE: 0.83 mg/dL (ref 0.61–1.24)
Chloride: 107 mmol/L (ref 101–111)
GFR calc Af Amer: >60 mL/min (ref >60)
Glucose, Bld: 126 mg/dL — ABNORMAL HIGH (ref 65–99)
Potassium: 4.2 mmol/L (ref 3.5–5.1)
Sodium: 136 mmol/L (ref 135–145)
TOTAL PROTEIN: 5.7 g/dL — AB (ref 6.5–8.1)

## 2016-07-15 LAB — CBC
HEMATOCRIT: 37.9 % — AB (ref 39.0–52.0)
Hemoglobin: 12.3 g/dL — ABNORMAL LOW (ref 13.0–17.0)
MCH: 30.8 pg (ref 26.0–34.0)
MCHC: 32.5 g/dL (ref 30.0–36.0)
MCV: 94.8 fL (ref 78.0–100.0)
Platelets: 61 10*3/uL — ABNORMAL LOW (ref 150–400)
RBC: 4 MIL/uL — ABNORMAL LOW (ref 4.22–5.81)
RDW: 14.5 % (ref 11.5–15.5)
WBC: 10.8 10*3/uL — ABNORMAL HIGH (ref 4.0–10.5)

## 2016-07-15 LAB — HEPATITIS PANEL, ACUTE
HCV Ab: >11 s/co ratio — ABNORMAL HIGH (ref 0.0–0.9)
HEP B S AG: POSITIVE — AB
Hep A IgM: NEGATIVE
Hep B C IgM: NEGATIVE

## 2016-07-15 LAB — HIV ANTIBODY (ROUTINE TESTING W REFLEX): HIV SCREEN 4TH GENERATION: NONREACTIVE

## 2016-07-15 LAB — FERRITIN: Ferritin: 140 ng/mL (ref 24–336)

## 2016-07-15 LAB — IRON AND TIBC
Iron: 104 ug/dL (ref 45–182)
SATURATION RATIOS: 39 % (ref 17.9–39.5)
TIBC: 266 ug/dL (ref 250–450)
UIBC: 162 ug/dL

## 2016-07-15 LAB — HEMOGLOBIN: HEMOGLOBIN: 13.8 g/dL (ref 13.0–17.0)

## 2016-07-15 MED ORDER — CEFTRIAXONE SODIUM 1 G IJ SOLR
1.0000 g | INTRAMUSCULAR | Status: DC
  Administered 2016-07-15 – 2016-07-16 (×2): 1 g via INTRAVENOUS
  Filled 2016-07-15 (×2): qty 10

## 2016-07-15 MED ORDER — MORPHINE SULFATE (PF) 4 MG/ML IV SOLN
4.0000 mg | INTRAVENOUS | Status: DC | PRN
  Administered 2016-07-15 – 2016-07-17 (×12): 4 mg via INTRAVENOUS
  Filled 2016-07-15 (×13): qty 1

## 2016-07-15 MED ORDER — DEXTROSE-NACL 5-0.45 % IV SOLN
INTRAVENOUS | Status: AC
  Administered 2016-07-15: 12:00:00 via INTRAVENOUS

## 2016-07-15 MED ORDER — OCTREOTIDE LOAD VIA INFUSION
50.0000 ug | Freq: Once | INTRAVENOUS | Status: AC
  Administered 2016-07-15: 50 ug via INTRAVENOUS
  Filled 2016-07-15: qty 25

## 2016-07-15 MED ORDER — SODIUM CHLORIDE 0.9 % IV SOLN
50.0000 ug/h | INTRAVENOUS | Status: DC
Start: 2016-07-15 — End: 2016-07-16
  Filled 2016-07-15 (×4): qty 1

## 2016-07-15 MED ORDER — MORPHINE SULFATE (PF) 2 MG/ML IV SOLN
2.0000 mg | INTRAVENOUS | Status: DC | PRN
  Administered 2016-07-15 (×2): 2 mg via INTRAVENOUS
  Filled 2016-07-15: qty 1

## 2016-07-15 NOTE — Progress Notes (Signed)
Patient stating that MD told him that they would increase his pain medication. Med Student as first contact paged to make aware and clarify for patient.

## 2016-07-15 NOTE — Progress Notes (Signed)
Subjective:  No acute events overnight. This morning patient continues to complain of pain in his abdomen. Continues to feel nauseous, but no vomiting or rectal bleeding. Had a normal BM yesterday, did not notice any dark stools.   Objective:  Vital signs in last 24 hours: Vitals:   07/14/16 2247 07/14/16 2309 07/15/16 0000 07/15/16 0608  BP: 117/63  107/66 125/81  Pulse: (!) 54 68 70 60  Resp: _0 Temp: 99.6 F (37.6 C)  98.4 F (36.9 C)   TempSrc: Oral  Oral   SpO2: 94%  96% 96%  Weight:      Height:       Physical Exam:  General: sleeping in bed, in NAD  Abd: distended, less tender on palpation compared to yesterday, hepatomegaly present, voluntary guarding, no rebound tenderness  Extremities: trace-1+ edema   Labs:    Ref. Range 07/15/2016 08:14  WBC Latest Ref Range: 4.0 - 10.5 K/uL 10.8 (H)  RBC Latest Ref Range: 4.22 - 5.81 MIL/uL 4.00 (L)  Hemoglobin Latest Ref Range: 13.0 - 17.0 g/dL 12.3 (L)  HCT Latest Ref Range: 39.0 - 52.0 % 37.9 (L)  MCV Latest Ref Range: 78.0 - 100.0 fL 94.8  MCH Latest Ref Range: 26.0 - 34.0 pg 30.8  MCHC Latest Ref Range: 30.0 - 36.0 g/dL 32.5  RDW Latest Ref Range: 11.5 - 15.5 % 14.5  Platelets Latest Ref Range: 150 - 400 K/uL 61 (L)     Ref. Range 07/15/2016 05:46  Sodium Latest Ref Range: 135 - 145 mmol/L 136  Potassium Latest Ref Range: 3.5 - 5.1 mmol/L 4.2  Chloride Latest Ref Range: 101 - 111 mmol/L 107  CO2 Latest Ref Range: 22 - 32 mmol/L 24  BUN Latest Ref Range: 6 - 20 mg/dL 18  Creatinine Latest Ref Range: 0.61 - 1.24 mg/dL 0.83  Calcium Latest Ref Range: 8.9 - 10.3 mg/dL 7.6 (L)  EGFR (Non-African Amer.) Latest Ref Range: >60 mL/min >60  EGFR (African American) Latest Ref Range: >60 mL/min >60  Glucose Latest Ref Range: 65 - 99 mg/dL 126 (H)  Anion gap Latest Ref Range: 5 - 15  5  Alkaline Phosphatase Latest Ref Range: 38 - 126 U/L 98  Albumin Latest Ref Range: 3.5 - 5.0 g/dL 1.8 (L)  AST Latest Ref Range: 15 -  41 U/L 69 (H)  ALT Latest Ref Range: 17 - 63 U/L 79 (H)  Total Protein Latest Ref Range: 6.5 - 8.1 g/dL 5.7 (L)  Total Bilirubin Latest Ref Range: 0.3 - 1.2 mg/dL 1.1   Lactic acid 1.5 Hep panel and HIV pending   Assessment/Plan:  Active Problems:   Cirrhosis (HCC)   Abdominal pain   Esophageal varices without bleeding (HCC)   Gastritis   Hematemesis without nausea  Dan [name] is a 45 yo male with new diagnosis of liver cirrhosis and current IV drug use who presented with worsening abdominal pain for the past 4-5 days, and upper GI bleed of unclear etiology, but high suspicion for PUD. He continues to be hemodynamically stable with no signs/symptoms of active bleeding. Will continue to workup his abdominal pain to determine etiology and further management.   1. Abdominal pain: Etiology remains unclear. Although patient denies NSAIDs and alcohol use, there is high suspicion for PUD. Will manage as described below and wait for EGD results to determine future management.  - Continue IV Protonix 19m BID   - Increase PRN morphine 2--> 483m  -  Continue Tylenol 650mg q6h PRN for pain control  - NPO for EGD   - mIVF D5 1/2NS @75cc/hr  - GI consult placed for EGD, appreciate recs      2. Upper GI bleeding: Presented with 2 episodes of hematemesis and 1 episode of melena. FOBT positive on admission. Currently HDS and not actively bleeding. High suspicion for PUD at this time.  - Management of abdominal pain as above  - GI following as above - started octreotide gtt and prophylactic rocephin  - Zofran PRN   3. Liver cirrhosis: New diagnosis. Child-Pugh Score 8 (Child Class B).  Suspect this is possibly due to chronic Hep C infection given his long history of IV drug use.  - f/u Hep panel and HIV   4. Leukocytosis: resolved   5. Thrombocytopenia: Likely secondary to patient's liver cirrhosis. Platelets stable in the 60s,.No active bleeding at this time.   6. Hyponatremia: resolved    7.  Elevated lactic acid: resolved   8. IV drug abuse: Patient reports last use a few days ago. UDS positive for benzodiazepines and THC.  - CIWA protocol  - SW consult   10. DVT ppx: SCDs - No anticoagulation due to concern for GI bleed   Dispo: Anticipated discharge in approximately 2 day(s).   LOS: 0 days   [name], Medical Student 07/15/2016, 10:06 AM Pager:[phone]  

## 2016-07-15 NOTE — Consult Note (Signed)
Referring Provider: No ref. provider found Primary Care Physician:  No primary care provider on file. Primary Gastroenterologist:  No primary GI  Reason for Consultation: Hematemesis and melena in newly diagnosis cirrhosis  HPI: Dan [name] is a 45 y.o. male with history of chronic pain and polysubstance IV abuse for 3-4 years. Currenetly using cocaine and opana daily. Obtains opioids from the "streets". S/p right thoracotomy 04/29/11 for lung abscess and empyema.   FOBT 07/14/16 Positive  CT 07/14/16 Cirrhosis of the liver with splenomegaly, portal hypertension, and probable esophageal varices. Large amount of ascites.  Extensive mucosal edema involving the stomach. This may be due to ascites and portal hypertension however gastritis could also be present. There is also mucosal edema in the distal esophagus.  Presented to the ED on 07/14/16 for central, sharp, abdominal pain with abdominal edema x 1 week. Prior to arrival he had 2 episodes of hematemesis back to back, described as bright red blood with some blackness. He denies any retching or vomiting before hematemesis episodes. He endorses no prior knowledge of cirrhosis and strongly denies alcohol or NSAID use. He expresses that his bowel movements have been normal denying constipation, diarrhea, or melena. No fevers, chills or weakness.   History reviewed. No pertinent past medical history.  History reviewed. No pertinent surgical history.   Prior to Admission medications   Not on File    Current Facility-Administered Medications  Medication Dose Route Frequency Provider Last Rate Last Dose  . 0.9 %  sodium chloride infusion   Intravenous Continuous Su Hoff, MD 150 mL/hr at 07/15/16 0308    . acetaminophen (TYLENOL) tablet 650 mg  650 mg Oral Q6H PRN Tasrif Ahmed, MD       Or  . acetaminophen (TYLENOL) suppository 650 mg  650 mg Rectal Q6H PRN Tasrif Ahmed, MD      . morphine 2 MG/ML injection 2 mg  2 mg Intravenous Q4H  PRN Bethany Molt, DO   2 mg at 07/15/16 0725  . ondansetron (ZOFRAN) injection 4 mg  4 mg Intravenous Q8H PRN Thomasene Lot, MD      . pantoprazole (PROTONIX) injection 40 mg  40 mg Intravenous Q12H Tyson Alias, MD   40 mg at 07/14/16 2121    Allergies as of 07/14/2016  . (No Known Allergies)    No family history on file.  Social History   Social History  . Marital status: Single    Spouse name: N/A  . Number of children: N/A  . Years of education: N/A   Occupational History  . Not on file.   Social History Main Topics  . Smoking status: Current Every Day Smoker    Packs/day: 0.50    Types: Cigarettes  . Smokeless tobacco: Not on file  . Alcohol use Yes  . Drug use:     Types: IV  . Sexual activity: Not on file   Other Topics Concern  . Not on file   Social History Narrative  . No narrative on file    Review of Systems: Gen: Denies any fever, chills, weakness. CV: Peripheral edema Denies chest pain,  Resp: Denies dyspnea at rest,cough GI: Denies jaundice, and fecal incontinence. Denies dysphagia or odynophagia. GU : Denies urinary burning, blood in urine, urinary frequency. MS: generalized pain Derm: Denies rash and itching  Psych: Denies memory loss or confusion. Heme: Denies bruising, bleeding, and enlarged lymph nodes. Neuro:  Denies any headaches, dizziness, paresthesias.    Physical Exam:  Vital signs in last 24 hours: Temp:  [97.9 F (36.6 C)-99.6 F (37.6 C)] 98.4 F (36.9 C) (08/02 0000) Pulse Rate:  [54-99] 60 (08/02 0608) Resp:  [16-19] 19 (08/02 0608) BP: (107-145)/(57-88) 125/81 (08/02 0608) SpO2:  [94 %-99 %] 96 % (08/02 0608) Weight:  [163 lb 12.8 oz (74.3 kg)] 163 lb 12.8 oz (74.3 kg) (08/01 1235) Last BM Date: 07/14/16 General:   Alert,  Well-developed, well-nourished, pleasant and cooperative in NAD Head:  Normocephalic and atraumatic. Eyes:  Sclera clear, no icterus.   Conjunctiva pink. Ears:  Normal auditory acuity. Nose:   No deformity, discharge,  or lesions. Mouth:  No deformity or lesions.   Neck:  Supple; no masses or thyromegaly. Lungs:  Clear throughout to auscultation.   No wheezes, crackles, or rhonchi.  Heart:  Regular rate and rhythm; no murmurs, clicks, rubs,  or gallops. Abdomen:  Distended and diffusely tender to light palpation, BS active,nonpalp mass.  Splenomegaly noted Rectal:  Deferred  Msk:  Symmetrical without gross deformities. Pulses:  Normal pulses noted. Extremities:  Trace edema bilaterally in LE. Neurologic:  Alert and  oriented x3  grossly normal neurologically. Skin:  Intact without significant lesions or rashes.. Psych:  Alert and cooperative. Normal mood and affect.  Intake/Output from previous day: 08/01 0701 - 08/02 0700 In: 2580 [P.O.:25; I.V.:2555] Out: 1701 [Urine:1700; Stool:1] Intake/Output this shift: Total I/O In: 247.5 [I.V.:247.5] Out: -   Lab Results:  Recent Labs  07/14/16 0640 07/14/16 1522  WBC 11.8* 11.0*  HGB 13.4 13.1  HCT 40.5 39.4  PLT 62* 55*   BMET  Recent Labs  07/14/16 0640 07/15/16 0546  NA 133* 136  K 4.2 4.2  CL 101 107  CO2 25 24  GLUCOSE 177* 126*  BUN 20 18  CREATININE 0.61 0.83  CALCIUM 7.9* 7.6*   LFT  Recent Labs  07/15/16 0546  PROT 5.7*  ALBUMIN 1.8*  AST 69*  ALT 79*  ALKPHOS 98  BILITOT 1.1   PT/INR  Recent Labs  07/14/16 0640  LABPROT 19.0*  INR 1.58   Hepatitis Panel No results for input(s): HEPBSAG, HCVAB, HEPAIGM, HEPBIGM in the last 72 hours.    Studies/Results: Dg Chest 2 View  Result Date: 07/14/2016 CLINICAL DATA:  Chest pain EXAM: CHEST  2 VIEW COMPARISON:  November 21, 2015 FINDINGS: There is stable scarring in the right upper lobe region. There is bibasilar atelectasis. There is no appreciable airspace consolidation. Heart size and pulmonary vascularity are normal. No adenopathy. Bony bridging between the right posterior fifth and sixth ribs is again noted. IMPRESSION: Bibasilar  atelectasis. Stable scarring right upper lobe. No airspace consolidation. Stable cardiac silhouette. Electronically Signed   By: Bretta Bang III M.D.   On: 07/14/2016 07:02   Ct Abdomen Pelvis W Contrast  Result Date: 07/14/2016 CLINICAL DATA:  Abdominal pain with nausea vomiting EXAM: CT ABDOMEN AND PELVIS WITH CONTRAST TECHNIQUE: Multidetector CT imaging of the abdomen and pelvis was performed using the standard protocol following bolus administration of intravenous contrast. CONTRAST:  80mL ISOVUE-300 IOPAMIDOL (ISOVUE-300) INJECTION 61% COMPARISON:  None. FINDINGS: Lower chest: Mild atelectasis in the dependent lung bases bilaterally. No effusion. Heart size within normal limits. Hepatobiliary: Enlargement of the left lobe liver. Irregular contour of the liver capsule. Findings consistent with cirrhosis. No mass lesion. Gallbladder is markedly distended without gallbladder Mallen thickening. No biliary dilatation Pancreas: Normal pancreas.  Negative for pancreatitis. Spleen: Splenomegaly. Splenic volume 800 mL. No focal splenic lesion or infarction.  Adrenals/Urinary Tract: 4 mm right upper pole calculus. 3 mm right lower pole calculus. No renal obstruction or mass. Normal bladder. Stomach/Bowel: Marked mucosal edema throughout the stomach diffusely. Mucosal edema in the distal esophagus. Probable varices at the GE junction. No other bowel edema. Negative for bowel obstruction or ileus. Appendix not visualized. Vascular/Lymphatic: Mild atherosclerotic calcification in the aorta without aneurysm. No lymphadenopathy. IVC normal. Reproductive: Mild prostate calcification and mild prostate enlargement Other: Large amount of ascites in all 4 quadrants. Findings compatible with cirrhosis. Musculoskeletal: Lumbar disc degeneration. No acute skeletal abnormality. IMPRESSION: Cirrhosis of the liver with splenomegaly, portal hypertension, and probable esophageal varices. Large amount of ascites. Extensive mucosal  edema involving the stomach. This may be due to ascites and portal hypertension however gastritis could also be present. There is also mucosal edema in the distal esophagus. Electronically Signed   By: Marlan Palau M.D.   On: 07/14/2016 11:07    IMPRESSION:  *Hepatic cirrhosis: CT findings confirm cirrhosis with splenomegaly, ascites, and portal hypertension. Acute hepatitis panel is pending.  Child-Pugh score 8 (Class B).  High suspicion of Hepatitis C due to long term IV drug use. Awaiting results.   *Acites secondary to cirrhosis. See above.  *Hypoalbuminemia: secondary to Hepatic cirrhosis. Albumin is 1.8.  See above  *Hematemesis: patient expressed 2 episodes of bloody vomit prior to admission but none since. Has a + FOBT. Hemoglobin is 13.4 on admission and today 12.3. Vital signs are stable. Possible esophageal varies, PUD, or esophagitis.   *Thrombocytopenia: likely secondary to splenomegaly sequestering. Platelet count holding around 60 since admission.   *IV drug abuse: for 3-4 years and currently uses opana and cocaine. UDS + for Benzodiazepines and THC. On CIWA protocol.    PLAN: 1) Start octreotide for portal hypertension.   2) Start Rocephin empirically for abdominal pain in the presence of ascites.    3) Order AFP, autoimmune hepatitis panel, and Iron panel  4) Remain NPO and have EGD tomorrow, as patient is stable.   5) check Hb at 1200 today.     Debbra Riding PA-S  07/15/2016, 8:34 AM

## 2016-07-15 NOTE — Progress Notes (Signed)
   Subjective: No acute events overnight. Patient continues to have increased pain in his abdomen. Additionally, he continues to feel nauseous but denies vomiting or constipation. He has not had additional black stools or hematemesis.  Objective:  Vital signs in last 24 hours: Vitals:   07/15/16 0000 07/15/16 0608 07/15/16 1012 07/15/16 1016  BP: 107/66 125/81 129/80 (!) 143/92  Pulse: 70 60 (!) 56   Resp: 18 19    Temp: 98.4 F (36.9 C)     TempSrc: Oral     SpO2: 96% 96% 97%   Weight:      Height:       Physical Exam  Constitutional:  Disheveled. Appeared in mild distress this morning and in acute pain.  HENT:  Head: Normocephalic and atraumatic.  Patient with extremely poor oral health and dentition.  Cardiovascular: Normal rate and regular rhythm.  Exam reveals no gallop and no friction rub.   No murmur heard. Respiratory: Effort normal. No respiratory distress. He has no rales.  GI: He exhibits distension. There is tenderness. There is guarding.  Patient is most tense to palpation in the upper mid epigastric region.     Assessment/Plan:  Principal Problem:   Upper GI bleeding Active Problems:   Cirrhosis (HCC)   Abdominal pain   Esophageal varices without bleeding (HCC)   Gastritis   Hematemesis without nausea  Dan [name] a 45 year old [name] with a history of liver cirrhosis and IV drug use who presents with worsening abdominal pain of 4-5 days, hematemesis and melena. .  1. Abdominal pain, active an stable -- Currently the differential diagnosis includes gastritis, peptic ulcer disease or spontaneous bacterial peritonitis -- Bedside abdominal ultrasound demonstrated small amount of ascitic fluid -- CT demonstrates extensive mucosal edema involving the stomach -- IV Protonix 40 mg twice a day -- Zofran 4 mg IV when necessary for nausea -- Morphine 4 mg when necessary for pain -- Nothing by mouth for EGD, GI consulted -- mIVF D5 1/2NS @75  cc/hr   2. GI  bleed, currently not active and stable -- Reports episodes of hematemesis 2, and melena.  -- No hematemesis or melena over the last 24 hours  -- Fecal occult blood test positive. -- IV Protonix 40 mg twice a day -- Octreotide GTT -- Prophylactic ceftriaxone -- GI following  3. Cirrhosis, active and stable -- Nodularity and cirrhosis of the liver noted on CT scan -- Thrombocytopenia with a platelet count of 62 -- Hepatitis panel, surface antibody, surface antigen and core antibody  4. Leukocytosis, resolved   5. Elevated lactic acid, resolved   6. IV drug abuse -- CIWA protocol  7. DVT prophylaxis -- SCDs -- No anticoagulation given concern for GI bleed  Dispo: Anticipated discharge in approximately 1-2 day(s).   LOS: 0 days   Dan Lot, MD 07/15/2016, 11:36 AM Pager: [phone]

## 2016-07-15 NOTE — Care Management Note (Addendum)
Case Management Note  Patient Details  Name: NAPHTALI MEHNER MRN: [id number] Date of Birth: 1971/09/26  Subjective/Objective:                 Patient admitted for cirrhosis, getting eval for paracentesis today. Lives at home with mother, on disability, payor is medicaid, last IV drug use 4 days ago.  EGD showed large gastric ulcer.    Action/Plan:  CM will continue to monitor. Will follow up at IM clinic   Expected Discharge Date:                  Expected Discharge Plan:     In-House Referral:  Clinical Social Work  Discharge planning Services  CM Consult  Post Acute Care Choice:    Choice offered to:     DME Arranged:    DME Agency:     HH Arranged:    HH Agency:     Status of Service:  In process, will continue to follow  If discussed at Long Length of Stay Meetings, dates discussed:    Additional Comments:  Lawerance Sabal, RN 07/15/2016, 11:59 AM

## 2016-07-16 ENCOUNTER — Encounter (HOSPITAL_COMMUNITY)
Admission: EM | Disposition: A | Discharge status: Home / Self Care | Payer: Self-pay | Source: Home / Self Care | Attending: Student in an Organized Health Care Education/Training Program

## 2016-07-16 ENCOUNTER — Inpatient Hospital Stay (HOSPITAL_COMMUNITY): Payer: Medicaid Other | Admitting: Anesthesiology

## 2016-07-16 ENCOUNTER — Encounter (HOSPITAL_COMMUNITY): Payer: Self-pay | Admitting: *Deleted

## 2016-07-16 DIAGNOSIS — B1920 Unspecified viral hepatitis C without hepatic coma: Secondary | ICD-10-CM

## 2016-07-16 DIAGNOSIS — B182 Chronic viral hepatitis C: Secondary | ICD-10-CM

## 2016-07-16 DIAGNOSIS — B181 Chronic viral hepatitis B without delta-agent: Secondary | ICD-10-CM

## 2016-07-16 DIAGNOSIS — K7031 Alcoholic cirrhosis of liver with ascites: Secondary | ICD-10-CM

## 2016-07-16 DIAGNOSIS — K257 Chronic gastric ulcer without hemorrhage or perforation: Secondary | ICD-10-CM

## 2016-07-16 DIAGNOSIS — F1020 Alcohol dependence, uncomplicated: Secondary | ICD-10-CM

## 2016-07-16 DIAGNOSIS — B1910 Unspecified viral hepatitis B without hepatic coma: Secondary | ICD-10-CM

## 2016-07-16 HISTORY — PX: ESOPHAGOGASTRODUODENOSCOPY: SHX5428

## 2016-07-16 LAB — CBC
HEMATOCRIT: 39.8 % (ref 39.0–52.0)
HEMOGLOBIN: 13.2 g/dL (ref 13.0–17.0)
MCH: 31.6 pg (ref 26.0–34.0)
MCHC: 33.2 g/dL (ref 30.0–36.0)
MCV: 95.2 fL (ref 78.0–100.0)
Platelets: 65 10*3/uL — ABNORMAL LOW (ref 150–400)
RBC: 4.18 MIL/uL — ABNORMAL LOW (ref 4.22–5.81)
RDW: 14.6 % (ref 11.5–15.5)
WBC: 12.8 10*3/uL — ABNORMAL HIGH (ref 4.0–10.5)

## 2016-07-16 LAB — CERULOPLASMIN: CERULOPLASMIN: 23.6 mg/dL (ref 16.0–31.0)

## 2016-07-16 LAB — ANTINUCLEAR ANTIBODIES, IFA: ANTINUCLEAR ANTIBODIES, IFA: NEGATIVE

## 2016-07-16 LAB — ANTI-SMOOTH MUSCLE ANTIBODY, IGG: F-ACTIN AB IGG: 35 U — AB (ref 0–19)

## 2016-07-16 LAB — AFP TUMOR MARKER: AFP TUMOR MARKER: 3.2 ng/mL (ref 0.0–8.3)

## 2016-07-16 LAB — HEPATITIS B SURFACE ANTIBODY,QUALITATIVE: Hep B S Ab: NONREACTIVE

## 2016-07-16 LAB — IGG: IgG (Immunoglobin G), Serum: 1948 mg/dL — ABNORMAL HIGH (ref 700–1600)

## 2016-07-16 LAB — HEPATITIS B CORE ANTIBODY, TOTAL: Hep B Core Total Ab: POSITIVE — AB

## 2016-07-16 LAB — ALPHA-1-ANTITRYPSIN: A1 ANTITRYPSIN SER: 139 mg/dL (ref 90–200)

## 2016-07-16 SURGERY — EGD (ESOPHAGOGASTRODUODENOSCOPY)
Anesthesia: Monitor Anesthesia Care

## 2016-07-16 MED ORDER — PROPOFOL 10 MG/ML IV BOLUS
INTRAVENOUS | Status: DC | PRN
  Administered 2016-07-16 (×2): 70 mg via INTRAVENOUS

## 2016-07-16 MED ORDER — PNEUMOCOCCAL VAC POLYVALENT 25 MCG/0.5ML IJ INJ
0.5000 mL | INJECTION | INTRAMUSCULAR | Status: DC
  Filled 2016-07-16: qty 0.5

## 2016-07-16 MED ORDER — PROPOFOL 500 MG/50ML IV EMUL
INTRAVENOUS | Status: DC | PRN
  Administered 2016-07-16: 150 ug/kg/min via INTRAVENOUS

## 2016-07-16 MED ORDER — SODIUM CHLORIDE 0.9 % IV SOLN: INTRAVENOUS | Status: DC

## 2016-07-16 MED ORDER — SUCRALFATE 1 GM/10ML PO SUSP
1.0000 g | Freq: Four times a day (QID) | ORAL | Status: DC
  Administered 2016-07-16 – 2016-07-17 (×5): 1 g via ORAL
  Filled 2016-07-16 (×5): qty 10

## 2016-07-16 MED ORDER — LACTATED RINGERS IV SOLN
INTRAVENOUS | Status: DC
  Administered 2016-07-16: 08:00:00 via INTRAVENOUS

## 2016-07-16 MED ORDER — BUTAMBEN-TETRACAINE-BENZOCAINE 2-2-14 % EX AERO
INHALATION_SPRAY | CUTANEOUS | Status: DC | PRN
  Administered 2016-07-16: 2 via TOPICAL

## 2016-07-16 NOTE — Transfer of Care (Signed)
Immediate Anesthesia Transfer of Care Note  Patient: Dan [name]  Procedure(s) Performed: Procedure(s): ESOPHAGOGASTRODUODENOSCOPY (EGD) (N/A)  Patient Location: Endoscopy Unit  Anesthesia Type:MAC  Level of Consciousness: sedated and patient cooperative  Airway & Oxygen Therapy: Patient Spontanous Breathing and Patient connected to nasal cannula oxygen  Post-op Assessment: Report given to RN and Post -op Vital signs reviewed and stable  Post vital signs: Reviewed and stable  Last Vitals:  Vitals:   07/16/16 0613 07/16/16 0722  BP: 117/75 (!) 157/85  Pulse: (!) 48   Resp: 18 17  Temp: 36.7 C 36.6 C    Last Pain:  Vitals:   07/16/16 0722  TempSrc: Oral  PainSc: 7       Patients Stated Pain Goal: 2 (07/16/16 6203)  Complications: No apparent anesthesia complications

## 2016-07-16 NOTE — Progress Notes (Signed)
Subjective:  No acute events overnight. Pain better controlled today, but continues to have breakthrough pain. No nausea or vomiting. No melena. Continues to have normal BMs.   Attempted diagnostic paracentesis this AM. However, the largest pocket of fluid observed on bedside US was too small in size to access and we were unable to obtain any fluid for analysis.   Objective:  Vital signs in last 24 hours: Vitals:   07/16/16 0825 07/16/16 0835 07/16/16 0845 07/16/16 1220  BP: (!) 123/91 (!) 166/78 (!) 161/92 (!) 151/92  Pulse: 71 62 (!) 58 (!) 54  Resp: (!) 23 (!) 22 (!) 24 17  Temp:    97.9 F (36.6 C)  TempSrc:    Oral  SpO2: 95% 99% 98% 96%  Weight:      Height:       Physical Exam:  General: resting in bed comfortably, in NAD  Abd: distended, tender to palpation, hepatomegaly 2cm below costal margin, rebound tenderness notes today, voluntary guarding  Extremities: trace-1+ pitting edema    Lab/Studies:    Ref. Range 07/16/2016 05:50  WBC Latest Ref Range: 4.0 - 10.5 K/uL 12.8 (H)  RBC Latest Ref Range: 4.22 - 5.81 MIL/uL 4.18 (L)  Hemoglobin Latest Ref Range: 13.0 - 17.0 g/dL 16.1  HCT Latest Ref Range: 39.0 - 52.0 % 39.8  MCV Latest Ref Range: 78.0 - 100.0 fL 95.2  MCH Latest Ref Range: 26.0 - 34.0 pg 31.6  MCHC Latest Ref Range: 30.0 - 36.0 g/dL 09.6  RDW Latest Ref Range: 11.5 - 15.5 % 14.6  Platelets Latest Ref Range: 150 - 400 K/uL 65 (L)   Hep B surface Ag +  Hep B surface Ab - Hep B core Ab + HCV AB 11.0   Assessment/Plan:  Principal Problem:   Upper GI bleeding Active Problems:   Cirrhosis (HCC)   Pain of upper abdomen   Esophageal varices without bleeding (HCC)   Gastritis   Hematemesis without nausea   Gastric ulceration   Hepatitis, viral   Chronic hepatitis C without hepatic coma (HCC)  Dan [name] is a 45 yo male with new diagnosis of liver cirrhosis and current IV drug use who presented with worsening abdominal pain for the past 4-5 days, and  upper GI bleed. EGD today showed a giant gastric ulcer as the etiology of his bleed. He continues to be hemodynamically stable with no signs/symptoms of active bleeding. In addition, his hepatitis serologies are consistent with chronic Hep B infection and possible Hep C infection.   1. Abdominal pain: EGD today showed giant gastric ulcer along with portal hypertensive gastropathy. There was not any high risk stigmata of bleeding. Given he denied use of alcohol and NSAIDs, the etiology of his gastric ulcer is unclear. It could be multifactorial: portal hypertensive gastropathy, hypersecretory state and long history smoking. Will manage it as described below. In addition, he reports overall improvement in pain, but continues to experience breakthrough pain. Will not add pain medication at this time given he was started on carafate today and this will probably help to improve his pain.  - ContinueIV Protonix 40mg  BID  - Continue PRN morphine 4mg    - Continue Tylenol 650mg  q6h PRN - Start HH diet  - D/c fluids given he was started on a diet today  - Avoid NSAIDs  - Liquid carafate q6h - GI following     2. Upper GI bleeding s/p EGD: Giant gastric ulcer seen on EGD. No active bleeding at  this time. Continues to be hemodynamically stable.  - Management of abdominal pain as above  - GI following as above - Continue prophylactic rocephin  - D/c octreotide gtt   -  F/u path results from EGD biopsy  - Zofran PRN   3. Liver cirrhosis: New diagnosis. Child-Pugh Score 8 (Child Class B).  Hepatitis serologies with positive hep B surface Ag and positive core AB, which could be consistent with both acute or chronic infection. Core IgM vs IgG not available to determine chronicity of disease. However, with his mild transaminitis, I suspect this is likely 2/2 chronic Hep B infection, rather than an acute infection. Serologies also showed a positive Hep C AB. Will order Hep C RNA PCR to evaluate if this due to  active infection or prior exposure.  - HIV non reactive  - F/u Hep e Ag/AB, Hep B DNA PCR, Hep C RNA PCR      - ID consult to review serologies and recs for outpatient follow-up                                4. Leukocytosis: WBC 12.8, increased from admission. Unclear whether this could be related with his Hep B infection. He has been afebrile since admission and has no other signs/symptoms of infection.  - Continue to monitor   5. Thrombocytopenia: Likely secondary to patient's liver cirrhosis. Platelets stable in the 60s.No active bleeding at this time.   6. IV drug abuse: Patient reports last use a few days ago. UDS positive for benzodiazepines and THC.  - SW consult   7. DVT ppx: SCDs - No anticoagulation due to concern for GI bleed  Dispo: Anticipated discharge in approximately 1-2 day(s).   LOS: 1 day   Dan [name], Medical Student 07/16/2016, 1:12 PM Pager: [phone]

## 2016-07-16 NOTE — Op Note (Signed)
[facility] Patient Name: Dan [name] Procedure Date : 07/16/2016 MRN: [id number] Attending MD: Willaim Rayas. 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 Adela Lank, MD, Sarah Monday RN, RN,                            Kandice Robinsons, Technician Referring MD:              Medicines:                Monitored Anesthesia Care Complications:            No immediate complications. Estimated blood loss:                            Minimal. Estimated Blood Loss:     Estimated blood loss was minimal. Procedure:                Pre-Anesthesia Assessment:                           - Prior to the procedure, a History and Physical                            was performed, and patient medications and                            allergies were reviewed. The patient's tolerance of                            previous anesthesia was also reviewed. The risks                            and benefits of the procedure and the sedation                            options and risks were discussed with the patient.                            All questions were answered, and informed consent                            was obtained. Prior Anticoagulants: The patient has                            taken no previous anticoagulant or antiplatelet  agents. ASA Grade Assessment: III - A patient with                            severe systemic disease. After reviewing the risks                            and benefits, the patient was deemed in                            satisfactory condition to undergo the procedure.                           After obtaining informed consent, the endoscope was   passed under direct vision. Throughout the                            procedure, the patient's blood pressure, pulse, and                            oxygen saturations were monitored continuously. The                            EG-[id number] ([id number]) scope was introduced through the                            mouth, and advanced to the second part of duodenum.                            The upper GI endoscopy was accomplished without                            difficulty. The patient tolerated the procedure                            well. Scope In: Scope Out: Findings:      Esophagogastric landmarks were identified: the Z-line was found at 42       cm, the gastroesophageal junction was found at 42 cm and the upper       extent of the gastric folds was found at 42 cm from the incisors.      The exam of the esophagus was otherwise normal. No esophageal varices.      A giant gastric ulcer with pigmented material, but no other high risk       stigmata was found in the gastric fundus and extended into in the       gastric body and on the greater curvature of the gastric body. Most of       it was continuous with other smaller separate smaller ulcerations around       the periphery. It was several (likely > 10cm) large. Biopsy was taken at       the periphery of the ulcer in the gastric body with a cold forceps for       histology.      Patchy moderately erythematous mucosa without bleeding was found in the       entire examined stomach, consistent with portal hypertensive  gastropathy.      The exam of the stomach was otherwise normal. No gastric varices      The duodenal bulb and second portion of the duodenum were normal other       than edema of the duodenal bulb. No obvious ulcerations were noted in       this area. Impression:               - Esophagogastric landmarks identified.                           - No esophageal varices                           - Giant gastric ulcer as outlined  above. Biopsied.                           - Erythematous mucosa in the stomach consistent                            with portal hypertensive gastropathy.                           - No gastric varices                           - Normal duodenum. Moderate Sedation:      No moderate sedation, case performed with MAC Recommendation:           - Return patient to hospital ward for ongoing care.                           - Clear liquid diet given no high risk stigmata and                            stable Hgb                           - Continue present medications (IV protonix)                           - NO NSAIDs                           - Await pathology results                           - Further recommendations per consult note Procedure Code(s):        --- Professional ---                           [id number], Esophagogastroduodenoscopy, flexible,                            transoral; with biopsy, single or multiple Diagnosis Code(s):        --- Professional ---  K25.9, Gastric ulcer, unspecified as acute or                            chronic, without hemorrhage or perforation                           D62, Acute posthemorrhagic anemia                           K92.0, Hematemesis CPT copyright 2016 [employer]. All rights reserved. The codes documented in this report are preliminary and upon coder review may  be revised to meet current compliance requirements. Viviann Spare P. Armbruster, MD 07/16/2016 8:38:35 AM This report has been signed electronically. Number of Addenda: 0

## 2016-07-16 NOTE — H&P (View-Only) (Signed)
 Referring Provider: No ref. provider found Primary Care Physician:  No primary care provider on file. Primary Gastroenterologist:  No primary GI  Reason for Consultation: Hematemesis and melena in newly diagnosis cirrhosis  HPI: Dan [name] is a 45 y.o. male with history of chronic pain and polysubstance IV abuse for 3-4 years. Currenetly using cocaine and opana daily. Obtains opioids from the "streets". S/p right thoracotomy 04/29/11 for lung abscess and empyema.   FOBT 07/14/16 Positive  CT 07/14/16 Cirrhosis of the liver with splenomegaly, portal hypertension, and probable esophageal varices. Large amount of ascites.  Extensive mucosal edema involving the stomach. This may be due to ascites and portal hypertension however gastritis could also be present. There is also mucosal edema in the distal esophagus.  Presented to the ED on 07/14/16 for central, sharp, abdominal pain with abdominal edema x 1 week. Prior to arrival he had 2 episodes of hematemesis back to back, described as bright red blood with some blackness. He denies any retching or vomiting before hematemesis episodes. He endorses no prior knowledge of cirrhosis and strongly denies alcohol or NSAID use. He expresses that his bowel movements have been normal denying constipation, diarrhea, or melena. No fevers, chills or weakness.   History reviewed. No pertinent past medical history.  History reviewed. No pertinent surgical history.   Prior to Admission medications   Not on File    Current Facility-Administered Medications  Medication Dose Route Frequency Provider Last Rate Last Dose  . 0.9 %  sodium chloride infusion   Intravenous Continuous Carly J Rivet, MD 150 mL/hr at 07/15/16 0308    . acetaminophen (TYLENOL) tablet 650 mg  650 mg Oral Q6H PRN Tasrif Ahmed, MD       Or  . acetaminophen (TYLENOL) suppository 650 mg  650 mg Rectal Q6H PRN Tasrif Ahmed, MD      . morphine 2 MG/ML injection 2 mg  2 mg Intravenous Q4H  PRN Bethany Molt, DO   2 mg at 07/15/16 0725  . ondansetron (ZOFRAN) injection 4 mg  4 mg Intravenous Q8H PRN James Taylor, MD      . pantoprazole (PROTONIX) injection 40 mg  40 mg Intravenous Q12H Duncan Thomas Vincent, MD   40 mg at 07/14/16 2121    Allergies as of 07/14/2016  . (No Known Allergies)    No family history on file.  Social History   Social History  . Marital status: Single    Spouse name: N/A  . Number of children: N/A  . Years of education: N/A   Occupational History  . Not on file.   Social History Main Topics  . Smoking status: Current Every Day Smoker    Packs/day: 0.50    Types: Cigarettes  . Smokeless tobacco: Not on file  . Alcohol use Yes  . Drug use:     Types: IV  . Sexual activity: Not on file   Other Topics Concern  . Not on file   Social History Narrative  . No narrative on file    Review of Systems: Gen: Denies any fever, chills, weakness. CV: Peripheral edema Denies chest pain,  Resp: Denies dyspnea at rest,cough GI: Denies jaundice, and fecal incontinence. Denies dysphagia or odynophagia. GU : Denies urinary burning, blood in urine, urinary frequency. MS: generalized pain Derm: Denies rash and itching  Psych: Denies memory loss or confusion. Heme: Denies bruising, bleeding, and enlarged lymph nodes. Neuro:  Denies any headaches, dizziness, paresthesias.    Physical Exam:   Vital signs in last 24 hours: Temp:  [97.9 F (36.6 C)-99.6 F (37.6 C)] 98.4 F (36.9 C) (08/02 0000) Pulse Rate:  [54-99] 60 (08/02 0608) Resp:  [16-19] 19 (08/02 0608) BP: (107-145)/(57-88) 125/81 (08/02 0608) SpO2:  [94 %-99 %] 96 % (08/02 0608) Weight:  [163 lb 12.8 oz (74.3 kg)] 163 lb 12.8 oz (74.3 kg) (08/01 1235) Last BM Date: 07/14/16 General:   Alert,  Well-developed, well-nourished, pleasant and cooperative in NAD Head:  Normocephalic and atraumatic. Eyes:  Sclera clear, no icterus.   Conjunctiva pink. Ears:  Normal auditory acuity. Nose:   No deformity, discharge,  or lesions. Mouth:  No deformity or lesions.   Neck:  Supple; no masses or thyromegaly. Lungs:  Clear throughout to auscultation.   No wheezes, crackles, or rhonchi.  Heart:  Regular rate and rhythm; no murmurs, clicks, rubs,  or gallops. Abdomen:  Distended and diffusely tender to light palpation, BS active,nonpalp mass.  Splenomegaly noted Rectal:  Deferred  Msk:  Symmetrical without gross deformities. Pulses:  Normal pulses noted. Extremities:  Trace edema bilaterally in LE. Neurologic:  Alert and  oriented x3  grossly normal neurologically. Skin:  Intact without significant lesions or rashes.. Psych:  Alert and cooperative. Normal mood and affect.  Intake/Output from previous day: 08/01 0701 - 08/02 0700 In: 2580 [P.O.:25; I.V.:2555] Out: 1701 [Urine:1700; Stool:1] Intake/Output this shift: Total I/O In: 247.5 [I.V.:247.5] Out: -   Lab Results:  Recent Labs  07/14/16 0640 07/14/16 1522  WBC 11.8* 11.0*  HGB 13.4 13.1  HCT 40.5 39.4  PLT 62* 55*   BMET  Recent Labs  07/14/16 0640 07/15/16 0546  NA 133* 136  K 4.2 4.2  CL 101 107  CO2 25 24  GLUCOSE 177* 126*  BUN 20 18  CREATININE 0.61 0.83  CALCIUM 7.9* 7.6*   LFT  Recent Labs  07/15/16 0546  PROT 5.7*  ALBUMIN 1.8*  AST 69*  ALT 79*  ALKPHOS 98  BILITOT 1.1   PT/INR  Recent Labs  07/14/16 0640  LABPROT 19.0*  INR 1.58   Hepatitis Panel No results for input(s): HEPBSAG, HCVAB, HEPAIGM, HEPBIGM in the last 72 hours.    Studies/Results: Dg Chest 2 View  Result Date: 07/14/2016 CLINICAL DATA:  Chest pain EXAM: CHEST  2 VIEW COMPARISON:  November 21, 2015 FINDINGS: There is stable scarring in the right upper lobe region. There is bibasilar atelectasis. There is no appreciable airspace consolidation. Heart size and pulmonary vascularity are normal. No adenopathy. Bony bridging between the right posterior fifth and sixth ribs is again noted. IMPRESSION: Bibasilar  atelectasis. Stable scarring right upper lobe. No airspace consolidation. Stable cardiac silhouette. Electronically Signed   By: William  Woodruff III M.D.   On: 07/14/2016 07:02   Ct Abdomen Pelvis W Contrast  Result Date: 07/14/2016 CLINICAL DATA:  Abdominal pain with nausea vomiting EXAM: CT ABDOMEN AND PELVIS WITH CONTRAST TECHNIQUE: Multidetector CT imaging of the abdomen and pelvis was performed using the standard protocol following bolus administration of intravenous contrast. CONTRAST:  80mL ISOVUE-300 IOPAMIDOL (ISOVUE-300) INJECTION 61% COMPARISON:  None. FINDINGS: Lower chest: Mild atelectasis in the dependent lung bases bilaterally. No effusion. Heart size within normal limits. Hepatobiliary: Enlargement of the left lobe liver. Irregular contour of the liver capsule. Findings consistent with cirrhosis. No mass lesion. Gallbladder is markedly distended without gallbladder Kraemer thickening. No biliary dilatation Pancreas: Normal pancreas.  Negative for pancreatitis. Spleen: Splenomegaly. Splenic volume 800 mL. No focal splenic lesion or infarction.   Adrenals/Urinary Tract: 4 mm right upper pole calculus. 3 mm right lower pole calculus. No renal obstruction or mass. Normal bladder. Stomach/Bowel: Marked mucosal edema throughout the stomach diffusely. Mucosal edema in the distal esophagus. Probable varices at the GE junction. No other bowel edema. Negative for bowel obstruction or ileus. Appendix not visualized. Vascular/Lymphatic: Mild atherosclerotic calcification in the aorta without aneurysm. No lymphadenopathy. IVC normal. Reproductive: Mild prostate calcification and mild prostate enlargement Other: Large amount of ascites in all 4 quadrants. Findings compatible with cirrhosis. Musculoskeletal: Lumbar disc degeneration. No acute skeletal abnormality. IMPRESSION: Cirrhosis of the liver with splenomegaly, portal hypertension, and probable esophageal varices. Large amount of ascites. Extensive mucosal  edema involving the stomach. This may be due to ascites and portal hypertension however gastritis could also be present. There is also mucosal edema in the distal esophagus. Electronically Signed   By: Charles  Clark M.D.   On: 07/14/2016 11:07    IMPRESSION:  *Hepatic cirrhosis: CT findings confirm cirrhosis with splenomegaly, ascites, and portal hypertension. Acute hepatitis panel is pending.  Child-Pugh score 8 (Class B).  High suspicion of Hepatitis C due to long term IV drug use. Awaiting results.   *Acites secondary to cirrhosis. See above.  *Hypoalbuminemia: secondary to Hepatic cirrhosis. Albumin is 1.8.  See above  *Hematemesis: patient expressed 2 episodes of bloody vomit prior to admission but none since. Has a + FOBT. Hemoglobin is 13.4 on admission and today 12.3. Vital signs are stable. Possible esophageal varies, PUD, or esophagitis.   *Thrombocytopenia: likely secondary to splenomegaly sequestering. Platelet count holding around 60 since admission.   *IV drug abuse: for 3-4 years and currently uses opana and cocaine. UDS + for Benzodiazepines and THC. On CIWA protocol.    PLAN: 1) Start octreotide for portal hypertension.   2) Start Rocephin empirically for abdominal pain in the presence of ascites.    3) Order AFP, autoimmune hepatitis panel, and Iron panel  4) Remain NPO and have EGD tomorrow, as patient is stable.   5) check Hb at 1200 today.     Paydon Carll PA-S  07/15/2016, 8:34 AM     

## 2016-07-16 NOTE — Anesthesia Postprocedure Evaluation (Signed)
Anesthesia Post Note  Patient: Dan [name]  Procedure(s) Performed: Procedure(s) (LRB): ESOPHAGOGASTRODUODENOSCOPY (EGD) (N/A)  Patient location during evaluation: PACU Anesthesia Type: MAC Level of consciousness: awake and alert Pain management: pain level controlled Vital Signs Assessment: post-procedure vital signs reviewed and stable Respiratory status: spontaneous breathing, nonlabored ventilation, respiratory function stable and patient connected to nasal cannula oxygen Cardiovascular status: stable and blood pressure returned to baseline Anesthetic complications: no    Last Vitals:  Vitals:   07/16/16 0835 07/16/16 0845  BP: (!) 166/78 (!) 161/92  Pulse: 62 (!) 58  Resp: (!) 22 (!) 24  Temp:      Last Pain:  Vitals:   07/16/16 0722  TempSrc: Oral  PainSc: 7                  Guillaume Weninger,JAMES TERRILL

## 2016-07-16 NOTE — Interval H&P Note (Signed)
History and Physical Interval Note:  07/16/2016 7:37 AM  Dan [name]  has presented today for surgery, with the diagnosis of hematemesis, cirrhoisis, varices per CT.  The various methods of treatment have been discussed with the patient and family. After consideration of risks, benefits and other options for treatment, the patient has consented to  Procedure(s): ESOPHAGOGASTRODUODENOSCOPY (EGD) (N/A) as a surgical intervention .  The patient's history has been reviewed, patient examined, no change in status, stable for surgery.  I have reviewed the patient's chart and labs.  Questions were answered to the patient's satisfaction.     [name]

## 2016-07-16 NOTE — Anesthesia Preprocedure Evaluation (Addendum)
Anesthesia Evaluation  Patient identified by MRN, date of birth, ID band Patient awake    Airway Mallampati: II  TM Distance: >3 FB Neck ROM: Full    Dental  (+) Poor Dentition, Missing   Pulmonary neg pulmonary ROS, Current Smoker,    breath sounds clear to auscultation       Cardiovascular negative cardio ROS   Rhythm:Regular Rate:Normal     Neuro/Psych    GI/Hepatic (+) Cirrhosis       , Hepatitis -Iv drug use   Endo/Other    Renal/GU      Musculoskeletal   Abdominal   Peds  Hematology   Anesthesia Other Findings   Reproductive/Obstetrics                            Anesthesia Physical Anesthesia Plan  ASA: III  Anesthesia Plan: MAC   Post-op Pain Management:    Induction: Intravenous  Airway Management Planned: Natural Airway  Additional Equipment:   Intra-op Plan:   Post-operative Plan: Extubation in OR  Informed Consent:   Dental advisory given  Plan Discussed with: CRNA  Anesthesia Plan Comments:        Anesthesia Quick Evaluation

## 2016-07-16 NOTE — Progress Notes (Signed)
   Subjective:  Had EGD done which showed large gastric ulcer. Continues to have abd pain (epigastric mainly, with some pain diffusely), wants to try eating solid food. Denies any more hematemesis or dark stools.   Objective:  Vital signs in last 24 hours: Vitals:   07/16/16 0825 07/16/16 0835 07/16/16 0845 07/16/16 1220  BP: (!) 123/91 (!) 166/78 (!) 161/92 (!) 151/92  Pulse: 71 62 (!) 58 (!) 54  Resp: (!) 23 (!) 22 (!) 24 17  Temp:    97.9 F (36.6 C)  TempSrc:    Oral  SpO2: 95% 99% 98% 96%  Weight:      Height:       Vitals reviewed. General: resting in bed, NAD HEENT: PERRL, EOMI, no scleral icterus Cardiac: RRR, no rubs, murmurs or gallops Pulm: clear to auscultation bilaterally Abd: remains distended with tenderness mainly on the epigastric area but also throughout his abdomen, has some rebound tenderness.  Ext: warm and well perfused,trace PE.  Neuro: alert and oriented X3  POC u/s showed he has more ascitic fluid than admission, however, when we attempted to do a diagnostic paracentesis, we could not obtain any fluid. Likely still a small volume overall.  Assessment/Plan:  Principal Problem:   Upper GI bleeding Active Problems:   Cirrhosis (HCC)   Pain of upper abdomen   Esophageal varices without bleeding (HCC)   Gastritis   Hematemesis without nausea   Gastric ulceration   Hepatitis, viral   Chronic hepatitis C without hepatic coma (HCC)  45 yo male with IVDU, ETOH abuse, and cirrhosis here with abd pain, hematemesis, found to have giant peptic ulcer.  Giant peptic ulcer found on EGD -unclear etiology, could be from etoh abuse or cirrhosis.  - cont IV protonix - await path results + H. Pylori test from EGD - carafate added - blood count stable for now - asked to avoid NSAID products.  - tolerating diet, stop fluid.   Cirrhosis with portal hypertensive gastropathy, no varices seen on EGD - performed some lab work to assess the etiology of cirrhosis.  Showing he has Hep B  (unsure if acute or chronic) and possibly Hep C infection - obtain further Hep B and Hep C labs - will consult ID about follow up - has mild ascites, could not get any fluid with attempted diagnostic paracentesis. May start lasix low dose.   Polysubstance abuse including IVDU and etoh -counseled on abstinence.   Dispo: Anticipated discharge in approximately 1-2 day(s).   LOS: 1 day   Hyacinth Meeker, MD 07/16/2016, 2:00 PM Pager: [phone]

## 2016-07-16 NOTE — Progress Notes (Signed)
  GI UPDATE:  EGD performed this AM. No esophageal or gastric varices, but a giant gastric ulcer was noted, without any high risk stigmata of bleeding, along with portal hypertensive gastropathy. I took some biopsies of the periphery of the ulcer to ensure no malignancy and to test for H pylori. He also tested positive for BOTH hep B and hep C which is the likely cause of his cirrhosis.   Recommend the following at this time: - continue IV protonix 40mg  BID for now given size of ulcer - can stop octreotide infusion given no varices - add liquid carafate 10cc PO q 6 hours - NO NSAIDS - clear liquid diet okay for today - await path results, treat for H pylori if positive - he will warrant repeat EGD as outpatient to assess for interval healing in 6-8 weeks - please obtain Hep C RNA and hep C genotype to confirm hep C status - please obtain hep B E AG and hep B AG AB as well as hep B DNA to clarify hep B status - he will need ID consult as outpatient in the future to discuss management of hep B/C - continue empiric antibiotics for possible SBP and in light of GI bleeding - recommend diagnostic paracentesis, given possible SBP and new diagnosis of cirrhosis - await AFP for [facility] screening  Call with questions.   Ileene Patrick, MD [facility] Pager [phone]

## 2016-07-16 NOTE — Anesthesia Procedure Notes (Signed)
Procedure Name: MAC Date/Time: 07/16/2016 8:05 AM Performed by: Rosiland Oz Pre-anesthesia Checklist: Patient identified, Emergency Drugs available, Suction available, Patient being monitored and Timeout performed Patient Re-evaluated:Patient Re-evaluated prior to inductionOxygen Delivery Method: Nasal cannula Intubation Type: IV induction

## 2016-07-17 ENCOUNTER — Encounter (HOSPITAL_COMMUNITY): Payer: Self-pay | Admitting: General Practice

## 2016-07-17 LAB — HEPATITIS B E ANTIBODY: HEP B E AB: NEGATIVE

## 2016-07-17 LAB — HEPATITIS B SURFACE AG, CONFIRM: HBSAG CONFIRMATION: POSITIVE — AB

## 2016-07-17 LAB — HEPATITIS B CORE ANTIBODY, IGM: Hep B C IgM: NEGATIVE

## 2016-07-17 LAB — HEPATITIS B E ANTIGEN: HEP B E AG: POSITIVE — AB

## 2016-07-17 LAB — HEPATITIS B SURFACE ANTIGEN

## 2016-07-17 LAB — MISC LABCORP TEST (SEND OUT)

## 2016-07-17 MED ORDER — PANTOPRAZOLE SODIUM 40 MG PO TBEC: 40.0000 mg | DELAYED_RELEASE_TABLET | Freq: Two times a day (BID) | ORAL | 2 refills | Status: DC

## 2016-07-17 MED ORDER — SUCRALFATE 1 GM/10ML PO SUSP: 1.0000 g | Freq: Four times a day (QID) | ORAL | 0 refills | Status: DC

## 2016-07-17 MED ORDER — ONDANSETRON HCL 4 MG PO TABS: 4.0000 mg | ORAL_TABLET | Freq: Three times a day (TID) | ORAL | 0 refills | Status: DC | PRN

## 2016-07-17 MED ORDER — OXYCODONE HCL 5 MG PO TABS: 5.0000 mg | ORAL_TABLET | Freq: Four times a day (QID) | ORAL | 0 refills | Status: DC | PRN

## 2016-07-17 NOTE — Discharge Summary (Signed)
Name: Dan [name] MRN: [id number] DOB: February 22, 1971 45 y.o. PCP: No primary care provider on file.  Date of Admission: 07/14/2016  6:08 AM Date of Discharge: 07/17/2016 Attending Physician: Tyson Alias, MD  Discharge Diagnosis: 1. Peptic ulcer disease 2. Cirrhosis 3. Chronic hepatitis B infection 4. Chronic hepatitis C infection   Discharge Medications:   Medication List    TAKE these medications   ondansetron 4 MG tablet Commonly known as:  ZOFRAN Take 1 tablet (4 mg total) by mouth every 8 (eight) hours as needed for nausea or vomiting.   oxyCODONE 5 MG immediate release tablet Commonly known as:  ROXICODONE Take 1 tablet (5 mg total) by mouth every 6 (six) hours as needed for severe pain.   pantoprazole 40 MG tablet Commonly known as:  PROTONIX Take 1 tablet (40 mg total) by mouth 2 (two) times daily.   sucralfate 1 GM/10ML suspension Commonly known as:  CARAFATE Take 10 mLs (1 g total) by mouth every 6 (six) hours.       Disposition and follow-up:   Mr.Dan [name] was discharged from [facility] in Good condition.  At the hospital follow up visit please address:  1.  Follow-up on pain management and PPI adherence. Arrange appointment at  [facility] for outpatient management of chronic hepatitis infection.  2.  Labs / imaging needed at time of follow-up: Will need to be scheduled for EGD in 6-8 weeks to assess status of peptic ulcer.  3.  Pending labs/ test needing follow-up: Follow-up surgical pathology for Helicobacter pylori infection. If positive will initiate treatment with triple therapy.  Follow-up Appointments: [facility] clinic 07/24/2016 2:15pm    Hospital Course by problem list: Active Problems:   Cirrhosis (HCC)   Pain of upper abdomen   Gastritis   Gastric ulceration   Hepatitis, viral   Chronic hepatitis C without hepatic coma (HCC)   1. Abdominal pain secondary to PUD Patient presented with acute abdominal pain that was most  severe in the midepigastric region. This was associated with one episode of hematemesis and melena. He was hemodynamically stable on presentation and remained hemodynamically stable throughout admission. GI was consult and EGD performed, which showed a giant ulcer in the gastric fundus extending into the gastric body. Patient was managed with IV Protonix for 3 days and Carafate. On discharge he was tolerating by mouth intake well and his pain was controlled. He will continue Protonix 40 mg twice a day, Zofran when necessary for nausea and oxycodone 5 mg as needed for pain. He has follow-up in the acute care clinic at [facility] and with gastroenterology in 6-8 weeks for repeat EGD.  2. Hematemesis and Melena  Patient reported a history of a single episode of hematemesis and melena. EGD by gastroenterology did not demonstrate esophageal varices. This episode of hematemesis and melena is most likely secondary to giant gastric ulcer found by EGD. He had no episodes of hematemesis or melena while in the hospital. He was hemodynamically stable throughout admission. His hemoglobin is normal. He will continue Protonix for treatment of peptic ulcer disease. He has follow-up with gastroenterology in 6-8 weeks for repeat EGD.  3. Liver Cirrhosis Upon presentation in the emergency department patient with transaminitis, thrombocytopenia and elevated INR. Patient with clinical signs of cirrhosis including abdominal distention, pain to palpation, guarding and rebound tenderness. Abdominal CT showed enlarged liver with peripheral nodularity. Point-of-care ultrasound also demonstrated ascites. Patient had hepatitis serologies to workup potential causes of  cirrhosis. He was positive for hepatitis B surface antigen and hepatitis B envelope antigen with a negative hepatitis B core IgM. This profile is most consistent with chronic hepatitis infection. Additionally, the patient had a positive hepatitis C antibody. Patient  was made aware of these results and endorsed understanding. He will follow-up in the ID clinic for outpatient management of chronic hepatitis B infection.  4. Leukocytosis Patient with white blood cell count of 11.8 on admission trended up to 12.8 on the day prior to discharge. The patient remained afebrile with no additional signs or symptoms to suggest underlying infection. This transient elevation in his leukocyte count may be secondary to a leukemoid reaction related to hepatitis B or hepatitis C infection. I do not think this leukocytosis represents an acute bacterial infectious etiology.  5. IV drug abuse Patient endorsed history of IV drug abuse. His UDS on admission was positive for THC and benzodiazepines. He was put on CIWA protocol with no signs of withdrawal during admission. He will need continued encouragement in the outpatient setting to avoid IV drug use.    Discharge Vitals:   BP (!) 127/91 (BP Location: Left Arm)   Pulse 62   Temp 98 F (36.7 C)   Resp 18   Ht 5\' 11"  (1.803 m)   Wt 163 lb 12.8 oz (74.3 kg)   SpO2 97%   BMI 22.85 kg/m   Pertinent Labs, Studies, and Procedures:  Repeat EGD in 6-8 weeks per gastroenterology  Discharge Instructions: Discharge Instructions    Diet - low sodium heart healthy    Complete by:  As directed   Diet general    Complete by:  As directed   Continue to drink plenty of fluids. Eating a bland diet will improve your pain. Try to avoid spicy foods, tomato based foods or foods high in fat  content. Continue to avoid alcohol of all forms. Do not take NSAIDS including ibuprofen, Advil or Goody's powder.   Discharge instructions    Complete by:  As directed   Please continue to take Protonix 40 mg twice a day. Please follow up in the outpatient clinic next week. Continue to stay hydrated drinking plenty of fluids. Eating a bland diet will help improve your pain. Do not take nonsteroidal anti-inflammatory drugs including ibuprofen, Advil,  BCs or Goody's powder.   Increase activity slowly    Complete by:  As directed   Increase activity slowly    Complete by:  As directed      Signed: Thomasene Lot, MD 07/17/2016, 11:55 AM   Pager: [phone]

## 2016-07-17 NOTE — Progress Notes (Signed)
Subjective:  No acute events overnight. States he continues to feel pain and does not have an appetite today. No nausea or vomiting. No melena. Having normal BMs.   Objective:  Vital signs in last 24 hours: Vitals:   07/16/16 0845 07/16/16 1220 07/16/16 2115 07/17/16 0551  BP: (!) 161/92 (!) 151/92 (!) 149/88 (!) 127/91  Pulse: (!) 58 (!) 54 (!) 55 62  Resp: (!) Temp:  97.9 F (36.6 C) 98.5 F (36.9 C) 98 F (36.7 C)  TempSrc:  Oral    SpO2: 98% 96% 92% 97%  Weight:      Height:       Physical Exam:  General: laying in bed comfortably, in NAD  CV: RRR, nl S1/S2, no m/r/g Chest: CTAB, no wheezes or crackled, no increased work of breathing  Abd: distended, tender to palpation, hepatomegaly with liver edge palpated ~2cm below the costal margin  Ext: trace-1+ pitting edema LE bilaterally   Lab:  Hepatitis serologies:  HBsAG + HBsAB - HBc IgM - HBeAG + HBeAB, HBV DNA PCR, HCV RNA PCR pending  Surgical pathology pending   Imaging:  No new imaging studies to review  Assessment/Plan:  Principal Problem:   Upper GI bleeding Active Problems:   Cirrhosis (HCC)   Pain of upper abdomen   Esophageal varices without bleeding (HCC)   Gastritis   Hematemesis without nausea   Gastric ulceration   Hepatitis, viral   Chronic hepatitis C without hepatic coma (HCC)  Dan [name] is a 45 yo male with new diagnosis ofliver cirrhosis and current IV drug use who presentedwith worsening abdominal pain for the past 4-5 days, and upper GI bleed. EGD showed a giant gastric ulcer as the etiology of his bleed. He continues to be HDS with no signs/symptoms of active bleeding. In addition, his hepatitis serologies are consistent with chronic Hep B infection and possible Hep C infection.   1. Abdominal pain: EGD today showed giant gastric ulcer along with portal hypertensive gastropathy without any high risk stigmata of bleeding. Given he denied use of alcohol and NSAIDs, the  etiology of his gastric ulcer is unclear. It could be multifactorial: portal hypertensive gastropathy, hypersecretory state and long history of smoking. He has been counseled on avoidance of NSAIDs for pain management. Continues to experience abdominal pain, but tolerating PO intake and no N/V.  - IV-->PO Protonix  BID - Liquid carafate q6h  - D/c PRN morphine and Tylenol  - D/c home today    2. Upper GI bleeding s/p EGD: Giant gastric ulcer seen on EGD. No active bleeding at this time. Continues to be hemodynamically stable.  - Management of abdominal pain as above  - D/c prophylactic rocephin   - Continue Zofran PRN   3. Liver cirrhosis: New diagnosis. Child-Pugh Score 8 (Child Class B). Hepatitis serologies with positive HBsAg and HBeAG, and negative HBc IgM, suggestive of chronic Hep B infection. Serologies also showed a positive Hep C AB. Hep C RNA PCR ordered to evaluate for prior exposure vs active infection.  - HIV non reactive  - F/u HBeAB, Hep B DNA PCR, Hep C RNA PCR      - ID consulted - likely chronic Hep B infection with concomitant Hep C infection. Will follow up with [facility] for outpatient management.  4. Leukocytosis: WBC 12.8 yesterday. Unclear whether this could be related with his Hep B infection. He has been afebrile since admission and has no other signs/symptoms of infection. No concerns at this time.   5. Thrombocytopenia: Likely secondary to patient's liver cirrhosis. Platelets stable in the 60s. No active bleeding at this time.   6. IV drug abuse: Patient reports last use a few days ago. UDS positive for benzodiazepines and THC.   7. DVT ppx: SCDs - No anticoagulation during this admission due to concern for GI bleed  Dispo: Discharge home today   LOS: 2 days   Dan [name], Medical Student 07/17/2016, 11:28 AM Pager: [phone]

## 2016-07-17 NOTE — Discharge Summary (Signed)
Name: Dan [name] MRN: [id number] DOB: Feb 01, 1971 45 y.o. PCP: No primary care provider on file.  Date of Admission: 07/14/2016  6:08 AM Date of Discharge: 07/17/2016 Attending Physician: Dr. Evette Doffing   Discharge Diagnosis:  1. Peptic ulcer disease  2. Cirrhosis  3. Chronic hepatitis B  Infection  4. Chronic hepatitis C infection   Discharge Medications:   Medication List    TAKE these medications   ondansetron 4 MG tablet Commonly known as:  ZOFRAN Take 1 tablet (4 mg total) by mouth every 8 (eight) hours as needed for nausea or vomiting.   oxyCODONE 5 MG immediate release tablet Commonly known as:  ROXICODONE Take 1 tablet (5 mg total) by mouth every 6 (six) hours as needed for severe pain.   pantoprazole 40 MG tablet Commonly known as:  PROTONIX Take 1 tablet (40 mg total) by mouth 2 (two) times daily.   sucralfate 1 GM/10ML suspension Commonly known as:  CARAFATE Take 10 mLs (1 g total) by mouth every 6 (six) hours.       Disposition and follow-up:   Dan [name] was discharged from [facility] in Stable condition.  At the hospital follow up visit please address:  1.  Please follow up with progression of pain and PPI adherence. He will also need an appointment at [facility] clinic for outpatient management of chronic hepatitis B infection. [facility] will follow up with his hepatitis workup.   2.  Labs / imaging needed at time of follow-up: repeat EGD in 6-8 weeks to assess for interval healing of gastric ulcer   3.  Pending labs/ test needing follow-up: Please follow up surgical pathology and treat for H. Pylori if positive    Follow-up Appointments: [facility] clinic 07/24/2016 2:15pm   Hospital Course by problem list: Active Problems:   Cirrhosis ([place])   Pain of upper abdomen   Gastritis   Gastric ulceration   Hepatitis, viral   Chronic hepatitis C without hepatic coma [facility])   Dan [name] is a 45 yo male with long history of IV drug abuse who  presented with acute onset abdominal pain, hematemesis and melena concerning for upper GI bleed.   1. Abdominal pain: Patient presented with acute abdominal pain associated with hematemesis and melena. He was hemodynamically stable on presentation with no active bleeding. He was started on oral PPI and morphine for pain control. GI was consulted and EGD performed, which showed a giant ulcer in the gastric fundus extending into the gastric body with no high risk stigmata. Patient was managed with IV Protonix x3 days and Carafate. On day of discharge he was stable and tolerating PO intake.   2. Upper GI bleed 2/2 PUD: Presenting symptoms concerning for upper GI bleed prompting EGD as described above. Management included IV Protonix, octreotide infusion, and prophylactic ceftriaxone. He remained hemodynamically stable during this admission and no active signs/symptoms of bleeding noted.    3. Liver cirrhosis: Initial workup in the ED showed transaminitis, thrombocytopenia, and INR of 1.5. In addition his exam was significant for abdominal distention and severe tenderness prompting an abdomen CT that showed an enlarged liver, significant splenomegaly and extensive mucosal edema of the stomach and distal esophagus concerning for liver cirrhosis and portal hypertension.EGD showed no esophageal varices. A diagnostic paracentesis attempted given concern for SBP due to severe abdominal tenderness. However, pocket of fluid too small in size to access and we were unable to obtain a sample. Workup to determine etiology  of his cirrhosis resulted in positive HBsAG and HBeAG with negative HBc IgM consistent with chronic hepatitis infection. His Hep C antibody was also positive. HCV DNA PCR ordered to determine if this is secondary to prior exposure or current infection. Patient aware of this results and understands he needs to follow up in ID clinic for outpatient management of chronic hepatitis B infection.       4.  Leukocytosis: WBC 11.8 on admission and 12.8 on day prior to discharge. Suspect this could be a leukemoid reaction or related to Hep B and Hep C infection. He remained afebrile during this admission and there were no other signs/symptoms of active infection.   5. IV drug abuse: Patient reported long history of IV drug use. UDS on admission positive for THC and benzodiazepines. He was put on CIWA protocol with no signs of withdrawal during this admission.   Discharge Vitals:   BP (!) 127/91 (BP Location: Left Arm)   Pulse 62   Temp 98 F (36.7 C)   Resp 18   Ht '5\' 11"'  (1.803 m)   Wt 74.3 kg (163 lb 12.8 oz)   SpO2 97%   BMI 22.85 kg/m   Pertinent Labs, Studies, and Procedures:    Ref. Range 07/16/2016 05:50  WBC Latest Ref Range: 4.0 - 10.5 K/uL 12.8 (H)  RBC Latest Ref Range: 4.22 - 5.81 MIL/uL 4.18 (L)  Hemoglobin Latest Ref Range: 13.0 - 17.0 g/dL 13.2  HCT Latest Ref Range: 39.0 - 52.0 % 39.8  MCV Latest Ref Range: 78.0 - 100.0 fL 95.2  MCH Latest Ref Range: 26.0 - 34.0 pg 31.6  MCHC Latest Ref Range: 30.0 - 36.0 g/dL 33.2  RDW Latest Ref Range: 11.5 - 15.5 % 14.6  Platelets Latest Ref Range: 150 - 400 K/uL 65 (L)     Ref. Range 07/15/2016 05:46  Sodium Latest Ref Range: 135 - 145 mmol/L 136  Potassium Latest Ref Range: 3.5 - 5.1 mmol/L 4.2  Chloride Latest Ref Range: 101 - 111 mmol/L 107  CO2 Latest Ref Range: 22 - 32 mmol/L 24  BUN Latest Ref Range: 6 - 20 mg/dL 18  Creatinine Latest Ref Range: 0.61 - 1.24 mg/dL 0.83  Calcium Latest Ref Range: 8.9 - 10.3 mg/dL 7.6 (L)  EGFR (Non-African Amer.) Latest Ref Range: >60 mL/min >60  EGFR (African American) Latest Ref Range: >60 mL/min >60  Glucose Latest Ref Range: 65 - 99 mg/dL 126 (H)  Anion gap Latest Ref Range: 5 - 15  5  Alkaline Phosphatase Latest Ref Range: 38 - 126 U/L 98  Albumin Latest Ref Range: 3.5 - 5.0 g/dL 1.8 (L)  AST Latest Ref Range: 15 - 41 U/L 69 (H)  ALT Latest Ref Range: 17 - 63 U/L 79 (H)  Total Protein  Latest Ref Range: 6.5 - 8.1 g/dL 5.7 (L)  Total Bilirubin Latest Ref Range: 0.3 - 1.2 mg/dL 1.1    Hepatitis serologies:  Hep A IgM negative  Hep B: HBsAG +, HBsAB-, HBc IgM-, HBeAg + Hep C AB +   CT abdomen/pelvis 07/14/2016:  IMPRESSION: Cirrhosis of the liver with splenomegaly, portal hypertension, and probable esophageal varices. Large amount of ascites. Extensive mucosal edema involving the stomach. This may be due to ascites and portal hypertension however gastritis could also be present. There is also mucosal edema in the distal esophagus.  EGD 07/16/2016: The esophagus was normal. No esophageal varices. A giant gastric ulcer with pigmented material, but no other high risk stigmata  was found in the gastric fundus and extended into in the gastric body and on the greater curvature of the gastric body. Most of it was continuous with other smaller separate smaller ulcerations around the periphery. It was severe (likely > 10cm) large. Biopsy was taken. Patchy moderately erythematous mucosa without bleeding was found in the entire examined stomach, consistent with portal hypertensive gastropathy.The exam of the stomach was otherwise normal. No gastric varices. The duodenal bulb and second portion of the duodenum were normal.  Discharge Instructions: Discharge Instructions    Diet - low sodium heart healthy    Complete by:  As directed   Diet general    Complete by:  As directed   Continue to drink plenty of fluids. Eating a bland diet will improve your pain. Try to avoid spicy foods, tomato based foods or foods high in fat  content. Continue to avoid alcohol of all forms. Do not take NSAIDS including ibuprofen, Advil or Goody's powder.   Discharge instructions    Complete by:  As directed   Please continue to take Protonix 40 mg twice a day. Please follow up in the outpatient clinic next week. Continue to stay hydrated drinking plenty of fluids. Eating a bland diet will help improve your pain. Do  not take nonsteroidal anti-inflammatory drugs including ibuprofen, Advil, BCs or Goody's powder.   Increase activity slowly    Complete by:  As directed   Increase activity slowly    Complete by:  As directed      Signed: Welford Roche, Medical Student 07/17/2016, 1:11 PM   [id number]

## 2016-07-17 NOTE — Progress Notes (Signed)
Subjective: Patient says he feels improved overall. He still complains of severe midepigastric abdominal pain but says this has improved since the time of presentation. He is ready for discharge to go home today.  Objective:  Vital signs in last 24 hours: Vitals:   07/16/16 0845 07/16/16 1220 07/16/16 2115 07/17/16 0551  BP: (!) 161/92 (!) 151/92 (!) 149/88 (!) 127/91  Pulse: (!) 58 (!) 54 (!) 55 62  Resp: (!) 24 17 16 18   Temp:  97.9 F (36.6 C) 98.5 F (36.9 C) 98 F (36.7 C)  TempSrc:  Oral    SpO2: 98% 96% 92% 97%  Weight:      Height:       Physical Exam  Constitutional: He is oriented to person, place, and time. He appears well-developed and well-nourished.  HENT:  Head: Normocephalic and atraumatic.  Poor oral dentition  Cardiovascular: Normal rate and regular rhythm.  Exam reveals no friction rub.   No murmur heard. Respiratory: Breath sounds normal. No respiratory distress. He has no wheezes. He has no rales.  GI: He exhibits distension. There is no tenderness. There is no rebound and no guarding.  Pain to palpation in the midepigastric region. Rebounding demonstrated. Patient's abdomen remains distended but has not increased over the past 2 days.  Musculoskeletal: He exhibits no edema.  Neurological: He is alert and oriented to person, place, and time.     Assessment/Plan:  Principal Problem:   Upper GI bleeding Active Problems:   Cirrhosis (HCC)   Pain of upper abdomen   Esophageal varices without bleeding (HCC)   Gastritis   Hematemesis without nausea   Gastric ulceration   Hepatitis, viral   Chronic hepatitis C without hepatic coma [facility])  Dan [name] is a 45 yo male with new diagnosis ofliver cirrhosis and current IV drug use who presentedwith worsening abdominal pain for  4-5 days, and upper GI bleed. EGD showed a giant gastric ulcer. He continues to be HDS with no signs/symptoms of active bleeding. In addition, his hepatitis serologies are consistent  with chronic Hep B infection and possible Hep C infection.   1. Abdominal pain: EGD showed giant gastric ulcer. Continues to experience abdominal pain, but tolerating PO intake and no N/V.  - IV-->PO Protonix 40mg  BID - Liquid carafate q6h  - D/c PRN morphine and Tylenol  - D/c home today    2. Upper GI bleeding s/p EGD: Giant gastric ulcer seen on EGD. No active bleeding at this time. Continues to be hemodynamically stable.  - Management of abdominal pain as above  - D/c prophylactic rocephin   - Zofran PRN prescribed for home  3. Liver cirrhosis: New diagnosis. Child-Pugh Score 8 (Child Class B). Hepatitis serologies positive HBsAg and HBeAG, and negative HBc IgM, suggestive of chronic Hep B infection. Serologies also showed a positive Hep C AB. Hep C RNA PCR ordered to evaluate for prior exposure vs active infection.  - HIV non reactive  - Hep B DNA PCR, Hep C RNA PCR  - ID consulted-Will follow up with [facility] for outpatient management.    4. Leukocytosis: WBC 12.8 yesterday. Unclear whether this could be related with his Hep B infection. He has been afebrile since admission and has no other signs/symptoms of infection. No concerns at this time.   5. Thrombocytopenia: Likely secondary to patient's liver cirrhosis. Platelets stable in the 60s. No active bleeding at this time.   Dispo: Anticipated discharge today.   LOS: 2 days  Thomasene Lot, MD 07/17/2016, 11:12 AM Pager: [phone]

## 2016-07-17 NOTE — Progress Notes (Signed)
Pt given discharge instructions, prescriptions, and care notes. Pt verbalized understanding AEB no further questions or concerns at this time. IV was discontinued, no redness, pain, or swelling noted at this time. Pt left the floor via wheelchair with staff in stable condition. 

## 2016-07-17 NOTE — Progress Notes (Signed)
Error

## 2016-07-18 LAB — HCV RNA QUANT RFLX ULTRA OR GENOTYP
HCV RNA QNT(LOG COPY/ML): 4.728 {Log_IU}/mL
HEPATITIS C QUANTITATION: 53500 [IU]/mL

## 2016-07-18 LAB — HEPATITIS C GENOTYPE

## 2016-07-20 ENCOUNTER — Encounter: Payer: Self-pay | Admitting: Gastroenterology

## 2016-07-24 ENCOUNTER — Ambulatory Visit: Payer: Medicaid Other

## 2016-09-25 ENCOUNTER — Inpatient Hospital Stay (HOSPITAL_COMMUNITY)
Admission: EM | Admit: 2016-09-25 | Discharge: 2016-10-01 | DRG: 432 | Disposition: A | Discharge status: Home / Self Care | Payer: Medicaid Other | Attending: Internal Medicine | Admitting: Internal Medicine

## 2016-09-25 ENCOUNTER — Emergency Department (HOSPITAL_COMMUNITY): Payer: Medicaid Other

## 2016-09-25 ENCOUNTER — Encounter (HOSPITAL_COMMUNITY): Payer: Self-pay | Admitting: Emergency Medicine

## 2016-09-25 DIAGNOSIS — K652 Spontaneous bacterial peritonitis: Secondary | ICD-10-CM | POA: Diagnosis present

## 2016-09-25 DIAGNOSIS — R188 Other ascites: Secondary | ICD-10-CM | POA: Diagnosis not present

## 2016-09-25 DIAGNOSIS — R1084 Generalized abdominal pain: Secondary | ICD-10-CM

## 2016-09-25 DIAGNOSIS — K257 Chronic gastric ulcer without hemorrhage or perforation: Secondary | ICD-10-CM

## 2016-09-25 DIAGNOSIS — B1920 Unspecified viral hepatitis C without hepatic coma: Secondary | ICD-10-CM | POA: Diagnosis not present

## 2016-09-25 DIAGNOSIS — B182 Chronic viral hepatitis C: Secondary | ICD-10-CM | POA: Diagnosis present

## 2016-09-25 DIAGNOSIS — K7011 Alcoholic hepatitis with ascites: Secondary | ICD-10-CM | POA: Diagnosis not present

## 2016-09-25 DIAGNOSIS — Z8711 Personal history of peptic ulcer disease: Secondary | ICD-10-CM

## 2016-09-25 DIAGNOSIS — K254 Chronic or unspecified gastric ulcer with hemorrhage: Secondary | ICD-10-CM | POA: Diagnosis present

## 2016-09-25 DIAGNOSIS — K3189 Other diseases of stomach and duodenum: Secondary | ICD-10-CM | POA: Diagnosis not present

## 2016-09-25 DIAGNOSIS — K7031 Alcoholic cirrhosis of liver with ascites: Principal | ICD-10-CM | POA: Diagnosis present

## 2016-09-25 DIAGNOSIS — K920 Hematemesis: Secondary | ICD-10-CM | POA: Diagnosis not present

## 2016-09-25 DIAGNOSIS — B181 Chronic viral hepatitis B without delta-agent: Secondary | ICD-10-CM | POA: Diagnosis present

## 2016-09-25 DIAGNOSIS — Z23 Encounter for immunization: Secondary | ICD-10-CM

## 2016-09-25 DIAGNOSIS — Z9114 Patient's other noncompliance with medication regimen: Secondary | ICD-10-CM

## 2016-09-25 DIAGNOSIS — K766 Portal hypertension: Secondary | ICD-10-CM | POA: Diagnosis present

## 2016-09-25 DIAGNOSIS — R109 Unspecified abdominal pain: Secondary | ICD-10-CM | POA: Diagnosis not present

## 2016-09-25 DIAGNOSIS — F17210 Nicotine dependence, cigarettes, uncomplicated: Secondary | ICD-10-CM | POA: Diagnosis present

## 2016-09-25 DIAGNOSIS — D696 Thrombocytopenia, unspecified: Secondary | ICD-10-CM | POA: Diagnosis present

## 2016-09-25 DIAGNOSIS — J9601 Acute respiratory failure with hypoxia: Secondary | ICD-10-CM | POA: Diagnosis not present

## 2016-09-25 DIAGNOSIS — F1010 Alcohol abuse, uncomplicated: Secondary | ICD-10-CM | POA: Diagnosis present

## 2016-09-25 DIAGNOSIS — R1033 Periumbilical pain: Secondary | ICD-10-CM | POA: Diagnosis not present

## 2016-09-25 DIAGNOSIS — K7469 Other cirrhosis of liver: Secondary | ICD-10-CM | POA: Diagnosis not present

## 2016-09-25 DIAGNOSIS — K744 Secondary biliary cirrhosis: Secondary | ICD-10-CM | POA: Diagnosis not present

## 2016-09-25 DIAGNOSIS — B1910 Unspecified viral hepatitis B without hepatic coma: Secondary | ICD-10-CM | POA: Diagnosis not present

## 2016-09-25 DIAGNOSIS — B9689 Other specified bacterial agents as the cause of diseases classified elsewhere: Secondary | ICD-10-CM | POA: Diagnosis not present

## 2016-09-25 DIAGNOSIS — B3789 Other sites of candidiasis: Secondary | ICD-10-CM | POA: Diagnosis not present

## 2016-09-25 DIAGNOSIS — K7290 Hepatic failure, unspecified without coma: Secondary | ICD-10-CM | POA: Diagnosis present

## 2016-09-25 DIAGNOSIS — R198 Other specified symptoms and signs involving the digestive system and abdomen: Secondary | ICD-10-CM

## 2016-09-25 DIAGNOSIS — R1013 Epigastric pain: Secondary | ICD-10-CM | POA: Diagnosis not present

## 2016-09-25 HISTORY — DX: Other psychoactive substance abuse, uncomplicated: F19.10

## 2016-09-25 HISTORY — DX: Unspecified viral hepatitis B without hepatic coma: B19.10

## 2016-09-25 LAB — LIPASE, BLOOD: Lipase: 27 U/L (ref 11–51)

## 2016-09-25 LAB — URINE MICROSCOPIC-ADD ON

## 2016-09-25 LAB — CBC
HEMATOCRIT: 41.4 % (ref 39.0–52.0)
Hemoglobin: 13.7 g/dL (ref 13.0–17.0)
MCH: 30.4 pg (ref 26.0–34.0)
MCHC: 33.1 g/dL (ref 30.0–36.0)
MCV: 92 fL (ref 78.0–100.0)
Platelets: 79 10*3/uL — ABNORMAL LOW (ref 150–400)
RBC: 4.5 MIL/uL (ref 4.22–5.81)
RDW: 14.7 % (ref 11.5–15.5)
WBC: 6.7 10*3/uL (ref 4.0–10.5)

## 2016-09-25 LAB — URINALYSIS, ROUTINE W REFLEX MICROSCOPIC
BILIRUBIN URINE: NEGATIVE
GLUCOSE, UA: NEGATIVE mg/dL
KETONES UR: NEGATIVE mg/dL
Leukocytes, UA: NEGATIVE
Nitrite: NEGATIVE
PH: 6 (ref 5.0–8.0)
Protein, ur: NEGATIVE mg/dL
Specific Gravity, Urine: 1.009 (ref 1.005–1.030)

## 2016-09-25 LAB — COMPREHENSIVE METABOLIC PANEL
ALBUMIN: 2.5 g/dL — AB (ref 3.5–5.0)
ALT: 26 U/L (ref 17–63)
AST: 38 U/L (ref 15–41)
Alkaline Phosphatase: 135 U/L — ABNORMAL HIGH (ref 38–126)
Anion gap: 5 (ref 5–15)
BILIRUBIN TOTAL: 0.4 mg/dL (ref 0.3–1.2)
BUN: 6 mg/dL (ref 6–20)
CHLORIDE: 106 mmol/L (ref 101–111)
CO2: 28 mmol/L (ref 22–32)
Calcium: 8.6 mg/dL — ABNORMAL LOW (ref 8.9–10.3)
Creatinine, Ser: 0.89 mg/dL (ref 0.61–1.24)
GFR calc Af Amer: >60 mL/min (ref >60)
GFR calc non Af Amer: >60 mL/min (ref >60)
GLUCOSE: 120 mg/dL — AB (ref 65–99)
POTASSIUM: 4.3 mmol/L (ref 3.5–5.1)
Sodium: 139 mmol/L (ref 135–145)
TOTAL PROTEIN: 7.2 g/dL (ref 6.5–8.1)

## 2016-09-25 LAB — RAPID URINE DRUG SCREEN, HOSP PERFORMED
AMPHETAMINES: NOT DETECTED
BENZODIAZEPINES: POSITIVE — AB
Barbiturates: NOT DETECTED
Cocaine: NOT DETECTED
OPIATES: NOT DETECTED
Tetrahydrocannabinol: NOT DETECTED

## 2016-09-25 LAB — CBC WITH DIFFERENTIAL/PLATELET
BASOS PCT: 0 %
Basophils Absolute: 0 10*3/uL (ref 0.0–0.1)
EOS ABS: 0 10*3/uL (ref 0.0–0.7)
Eosinophils Relative: 0 %
LYMPHS ABS: 1.2 10*3/uL (ref 0.7–4.0)
Lymphocytes Relative: 17 %
MONO ABS: 0.5 10*3/uL (ref 0.1–1.0)
MONOS PCT: 7 %
Neutro Abs: 5.1 10*3/uL (ref 1.7–7.7)
Neutrophils Relative %: 76 %

## 2016-09-25 LAB — PROTEIN, BODY FLUID

## 2016-09-25 LAB — LACTATE DEHYDROGENASE, PLEURAL OR PERITONEAL FLUID: LD, Fluid: 29 U/L — ABNORMAL HIGH (ref 3–23)

## 2016-09-25 LAB — ALBUMIN, FLUID (OTHER)

## 2016-09-25 LAB — PROTIME-INR
INR: 1.28
PROTHROMBIN TIME: 16.1 s — AB (ref 11.4–15.2)

## 2016-09-25 LAB — I-STAT CG4 LACTIC ACID, ED: LACTIC ACID, VENOUS: 1.65 mmol/L (ref 0.5–1.9)

## 2016-09-25 LAB — GLUCOSE, PERITONEAL FLUID: Glucose, Peritoneal Fluid: 118 mg/dL

## 2016-09-25 MED ORDER — MORPHINE SULFATE (PF) 2 MG/ML IV SOLN
2.0000 mg | Freq: Once | INTRAVENOUS | Status: AC
  Administered 2016-09-25: 2 mg via INTRAVENOUS
  Filled 2016-09-25: qty 1

## 2016-09-25 MED ORDER — HYDROMORPHONE HCL 1 MG/ML IJ SOLN
1.0000 mg | Freq: Once | INTRAMUSCULAR | Status: AC
  Administered 2016-09-25: 1 mg via INTRAVENOUS
  Filled 2016-09-25: qty 1

## 2016-09-25 MED ORDER — ONDANSETRON HCL 4 MG/2ML IJ SOLN
4.0000 mg | Freq: Once | INTRAMUSCULAR | Status: AC
  Administered 2016-09-25: 4 mg via INTRAVENOUS
  Filled 2016-09-25: qty 2

## 2016-09-25 MED ORDER — SODIUM CHLORIDE 0.9 % IV SOLN: 250.0000 mL | INTRAVENOUS | Status: DC | PRN

## 2016-09-25 MED ORDER — METRONIDAZOLE IN NACL 5-0.79 MG/ML-% IV SOLN
500.0000 mg | Freq: Three times a day (TID) | INTRAVENOUS | Status: DC
  Administered 2016-09-25 – 2016-09-26 (×2): 500 mg via INTRAVENOUS
  Filled 2016-09-25 (×4): qty 100

## 2016-09-25 MED ORDER — SODIUM CHLORIDE 0.9% FLUSH: 3.0000 mL | INTRAVENOUS | Status: DC | PRN

## 2016-09-25 MED ORDER — MORPHINE SULFATE (PF) 2 MG/ML IV SOLN
2.0000 mg | Freq: Once | INTRAVENOUS | Status: AC
Start: 2016-09-25 — End: 2016-09-25
  Administered 2016-09-25: 2 mg via INTRAVENOUS
  Filled 2016-09-25: qty 1

## 2016-09-25 MED ORDER — PANTOPRAZOLE SODIUM 40 MG PO TBEC
40.0000 mg | DELAYED_RELEASE_TABLET | Freq: Two times a day (BID) | ORAL | Status: DC
  Administered 2016-09-25 – 2016-10-01 (×12): 40 mg via ORAL
  Filled 2016-09-25 (×13): qty 1

## 2016-09-25 MED ORDER — SUCRALFATE 1 GM/10ML PO SUSP
1.0000 g | Freq: Three times a day (TID) | ORAL | Status: DC
  Administered 2016-09-25 – 2016-10-01 (×21): 1 g via ORAL
  Filled 2016-09-25 (×21): qty 10

## 2016-09-25 MED ORDER — DEXTROSE 5 % IV SOLN
2.0000 g | Freq: Once | INTRAVENOUS | Status: AC
  Administered 2016-09-25: 2 g via INTRAVENOUS
  Filled 2016-09-25: qty 2

## 2016-09-25 MED ORDER — CEFTRIAXONE SODIUM 2 G IJ SOLR
2.0000 g | INTRAMUSCULAR | Status: DC
  Administered 2016-09-26 – 2016-09-30 (×5): 2 g via INTRAVENOUS
  Filled 2016-09-25 (×6): qty 2

## 2016-09-25 MED ORDER — ONDANSETRON HCL 4 MG PO TABS: 4.0000 mg | ORAL_TABLET | Freq: Three times a day (TID) | ORAL | Status: DC | PRN

## 2016-09-25 MED ORDER — DEXTROSE 5 % IV SOLN
2.0000 g | Freq: Once | INTRAVENOUS | Status: DC
  Filled 2016-09-25: qty 2

## 2016-09-25 MED ORDER — METRONIDAZOLE IN NACL 5-0.79 MG/ML-% IV SOLN
500.0000 mg | Freq: Once | INTRAVENOUS | Status: AC
  Administered 2016-09-25: 500 mg via INTRAVENOUS
  Filled 2016-09-25: qty 100

## 2016-09-25 MED ORDER — OXYCODONE HCL 5 MG PO TABS
5.0000 mg | ORAL_TABLET | Freq: Four times a day (QID) | ORAL | Status: DC | PRN
  Administered 2016-09-25 – 2016-10-01 (×17): 5 mg via ORAL
  Filled 2016-09-25 (×17): qty 1

## 2016-09-25 MED ORDER — MORPHINE SULFATE (PF) 4 MG/ML IV SOLN
4.0000 mg | Freq: Once | INTRAVENOUS | Status: AC
  Administered 2016-09-25: 4 mg via INTRAVENOUS
  Filled 2016-09-25: qty 1

## 2016-09-25 MED ORDER — PANTOPRAZOLE SODIUM 40 MG PO TBEC: 40.0000 mg | DELAYED_RELEASE_TABLET | Freq: Two times a day (BID) | ORAL | Status: DC

## 2016-09-25 MED ORDER — SODIUM CHLORIDE 0.9% FLUSH
3.0000 mL | Freq: Two times a day (BID) | INTRAVENOUS | Status: DC
  Administered 2016-09-26 – 2016-10-01 (×11): 3 mL via INTRAVENOUS

## 2016-09-25 MED ORDER — LIDOCAINE-EPINEPHRINE 2 %-1:100000 IJ SOLN
10.0000 mL | Freq: Once | INTRAMUSCULAR | Status: AC
  Administered 2016-09-25: 10 mL
  Filled 2016-09-25: qty 10

## 2016-09-25 NOTE — ED Notes (Signed)
Paged admitting MD about patient's pain

## 2016-09-25 NOTE — Progress Notes (Signed)
LDA charted on wrong PT

## 2016-09-25 NOTE — ED Triage Notes (Signed)
Onset 5 weeks ago abdominal pain with swelling along with edema bilateral lower extremities.  Patient admitted to hospital 3 weeks ago for 3 days. States swelling increasing along with emesis x 2 "blackish red" in color both times. Stated "medium" amount.

## 2016-09-25 NOTE — H&P (Addendum)
History and Physical   Dan Alphaony W Paradis ZOX:[id number] DOB: 07/04/1971 DOA: 09/25/2016  Referring MD/NP/PA: Margarita Grizzleanielle Ray, MD EDP PCP: No PCP Per Patient Outpatient Specialists: Never established  Patient coming from: Home  Chief Complaint: Abdominal pain  HPI: Dan [name] is a 45 y.o. 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 He reports chronic protuberant abdomen that has enlarged over the past 24 hours. This has been accompanied by aching pain throughout the abdomen, nonradiating, severe, constant, associated with nausea and vomiting x2 last night with red blood in it. He has taken no medications, including NSAIDs. Denies alcohol use. He was admitted in August for hematemesis found to have cirrhosis and giant gastric ulcer on EGD. He was to take PPI and carafate and [facility], and [facility] but never did. On arrival to the ED he was in no distress, afebrile with normal vital signs. A distended, protuberant abdomen with fluid wave was noted and diagnostic paracentesis was performed in the ED. Antibiotics were started and [facility] called to admit. Complete metabolic panel largely unremarkable, albumin 2.5. CBC abnormal with platelets 79. Lactate normal, UA uninfected, CXR neg, UDS + benzos. Ascitic fluid analysis pending.   Review of Systems: Denies fever, chills, weight loss, changes in vision or hearing, headache, cough, sore throat, chest pain, palpitations, shortness of breath, changes in bowel habits, melena, blood in stool, change in bladder habits, myalgias, arthralgias, and rash. And per HPI. All others reviewed and are negative.   Past Medical History:  Diagnosis Date  . Cirrhosis of liver (HCC)   . Gastric ulceration   . Hepatitis C   . Upper GI bleed     Past Surgical History:  Procedure Laterality Date  . ESOPHAGOGASTRODUODENOSCOPY N/A 07/16/2016     Procedure: ESOPHAGOGASTRODUODENOSCOPY (EGD);  Surgeon: Ruffin FrederickSteven Paul Armbruster, MD;  [facility];  Service: Gastroenterology;  Laterality: N/A;  . LUNG SURGERY     Still smoking cigarettes, denies alcohol use. Lives in [place] with his mother, unemployed. Works around [employer] house and cares for his mother.   reports that he has been smoking Cigarettes.  He has been smoking about 0.50 packs per day. He has never used smokeless tobacco. He reports that he does not drink alcohol or use drugs.  No Known Allergies  History reviewed. No pertinent family history. - Family history otherwise reviewed and not pertinent. Denies FH liver disease, IBD. Prior to Admission medications   Medication Sig Start Date End Date Taking? Authorizing Provider  ondansetron (ZOFRAN) 4 MG tablet Take 1 tablet (4 mg total) by mouth every 8 (eight) hours as needed for nausea or vomiting. Patient not taking: Reported on 09/25/2016 07/17/16   Thomasene LotJames Taylor, MD  oxyCODONE (ROXICODONE) 5 MG immediate release tablet Take 1 tablet (5 mg total) by mouth every 6 (six) hours as needed for severe pain. Patient not taking: Reported on 09/25/2016 07/17/16   Thomasene LotJames Taylor, MD  pantoprazole (PROTONIX) 40 MG tablet Take 1 tablet (40 mg total) by mouth 2 (two) times daily. Patient not taking: Reported on 09/25/2016 07/17/16   Thomasene LotJames Taylor, MD  sucralfate (CARAFATE) 1 GM/10ML suspension Take 10 mLs (1 g total) by mouth every 6 (six) hours. Patient not taking: Reported on 09/25/2016 07/17/16   Thomasene LotJames Taylor, MD    Physical Exam: Vitals:   09/25/16 1445 09/25/16 1500 09/25/16 1515  09/25/16 1530  BP: 108/65 107/72 139/83 133/96  Pulse:    86  Resp: 18 20 23 18   Temp:      TempSrc:      SpO2: 97% 95% 95% 97%  Weight:      Height:       Constitutional: 45 y.o. male in no distress, calm demeanor Eyes: Lids and conjunctivae normal, PERRL ENMT: Mucous membranes are moist. Posterior pharynx clear of any exudate, blood, or lesions.  Poor dentition.  Neck: normal, supple, no masses, no thyromegaly Respiratory: Non-labored breathing ambient air without accessory muscle use. Clear breath sounds to auscultation with limited expansion Cardiovascular: Regular rate and rhythm, no murmurs, rubs, or gallops. No carotid bruits. No JVD. Trace LE edema. 2+ pedal pulses. Abdomen: Normoactive bowel sounds. Large, distended with fluid wave, s/p RLQ paracentesis without bleeding. Diffusely tender without rebound. Unable to assess for hepatosplenomegaly. GU: No indwelling catheter Musculoskeletal: No clubbing / cyanosis. No joint deformity upper and lower extremities. Good ROM, no contractures. Normal muscle tone.  Skin: Warm, dry. Extensive photodermatosis, otherwise no rashes, wounds, or ulcers.  Neurologic: CN II-XII grossly intact. Gait not assessed. Speech normal. No focal deficits in motor strength or sensation in all extremities. DTRs 2+.  Psychiatric: Alert and oriented x3. Fair judgment and limited insight. Mood euthymic with congruent affect.   Labs on Admission: I have personally reviewed following labs and imaging studies  CBC:  Recent Labs Lab 09/25/16 0944 09/25/16 1006  WBC 6.7 DUPLICATE SEE [id number]  NEUTROABS  --  5.1  HGB 13.7 DUPLICATE SEE [id number]  HCT 41.4 DUPLICATE SEE [id number]  MCV 92.0 DUPLICATE SEE [id number]  PLT 79* DUPLICATE SEE [id number]   Basic Metabolic Panel:  Recent Labs Lab 09/25/16 0944  NA 139  K 4.3  CL 106  CO2 28  GLUCOSE 120*  BUN 6  CREATININE 0.89  CALCIUM 8.6*   GFR: Estimated Creatinine Clearance: 98.7 mL/min (by C-G formula based on SCr of 0.89 mg/dL). Liver Function Tests:  Recent Labs Lab 09/25/16 0944  AST 38  ALT 26  ALKPHOS 135*  BILITOT 0.4  PROT 7.2  ALBUMIN 2.5*    Recent Labs Lab 09/25/16 0944  LIPASE 27   No results for input(s): AMMONIA in the last 168 hours. Coagulation Profile:  Recent Labs Lab 09/25/16 1006  INR 1.28   Cardiac Enzymes: No results  for input(s): CKTOTAL, CKMB, CKMBINDEX, TROPONINI in the last 168 hours. BNP (last 3 results) No results for input(s): PROBNP in the last 8760 hours. HbA1C: No results for input(s): HGBA1C in the last 72 hours. CBG: No results for input(s): GLUCAP in the last 168 hours. Lipid Profile: No results for input(s): CHOL, HDL, LDLCALC, TRIG, CHOLHDL, LDLDIRECT in the last 72 hours. Thyroid Function Tests: No results for input(s): TSH, T4TOTAL, FREET4, T3FREE, THYROIDAB in the last 72 hours. Anemia Panel: No results for input(s): VITAMINB12, FOLATE, FERRITIN, TIBC, IRON, RETICCTPCT in the last 72 hours. Urine analysis:    Component Value Date/Time   COLORURINE YELLOW 09/25/2016 1152   APPEARANCEUR CLEAR 09/25/2016 1152   LABSPEC 1.009 09/25/2016 1152   PHURINE 6.0 09/25/2016 1152   GLUCOSEU NEGATIVE 09/25/2016 1152   HGBUR TRACE (A) 09/25/2016 1152   BILIRUBINUR NEGATIVE 09/25/2016 1152   KETONESUR NEGATIVE 09/25/2016 1152   PROTEINUR NEGATIVE 09/25/2016 1152   NITRITE NEGATIVE 09/25/2016 1152   LEUKOCYTESUR NEGATIVE 09/25/2016 1152   Sepsis Labs: @LABRCNTIP (procalcitonin:4,lacticidven:4) )No results found for this or any previous visit (from the past 240 hour(s)).  Radiological Exams on Admission: Dg Chest Port 1 View  Result Date: 09/25/2016 CLINICAL DATA:  Dyspnea, cough, abdominal pain EXAM: PORTABLE CHEST 1 VIEW COMPARISON:  09/11/2016 FINDINGS: There are low lung volumes. There is bilateral diffuse interstitial thickening likely chronic. There is no focal parenchymal opacity. There is no pleural effusion or pneumothorax. The heart and mediastinal contours are unremarkable. The osseous structures are unremarkable. IMPRESSION: No active cardiopulmonary disease. Electronically Signed   By: Elige Ko   On: 09/25/2016 10:27   Assessment/Plan Active Problems:   Cirrhosis (HCC)   Hematemesis   Gastric ulceration   Hepatitis, viral   Chronic hepatitis C without hepatic coma  (HCC)   SBP (spontaneous bacterial peritonitis) (HCC)   Spontaneous bacterial peritonitis (HCC)   Spontaneous bacterial peritonitis: Acute abdominal pain in chronic cirrhotic patient. Nontoxic. - Paracentesis performed in ED - Follow up cell count, LD, glucose, gram stain, culture - s/p ceftriaxone and flagyl in ED, will continue with empiric ceftriaxone (national shortage of cefotaxime) and flagyl.   Hematemesis without anemia: With history of EGD Aug 2017 showing very large gastric ulcer by Dr. Adela Lank. Plan was for repeat EGD in 6 weeks. Nonadherent to PPI and carafate.  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 Without transaminitis, but evidence of impaired synthetic function, albumin 2.5, INR 1.28. Stable and asymptomatic thrombocytopenia, no hyponatremia.   Chronic hepatitis B and C: Hep C genotype 1a, quant 53k. + Hep B titers in Aug 2017.  - Recommend outpatient follow up at [facility] to consider treatment.   DVT prophylaxis: SCDs due to hematemesis  Code Status: Full confirmed at admission  Family Communication: None at bedside. He will call his mother and declined offer for me to contact family.  Disposition Plan: Admit to med-surg Consults called: [facility], Jennye Moccasin  Admission status: Inpatient.   Hazeline Junker, MD [facility] Pager [phone]  If 7PM-7AM, please contact night-coverage [email or web address] Password [email or web address] 09/25/2016, 4:27 PM

## 2016-09-25 NOTE — ED Notes (Signed)
Pt given broth and clear liquids

## 2016-09-25 NOTE — ED Provider Notes (Signed)
[facility]-EMERGENCY DEPT Provider Note   CSN: [id number] Arrival date & time: 09/25/16  4098     History   Chief Complaint Chief Complaint  Patient presents with  . Abdominal Pain  . Leg Pain    HPI Dan [name] is a 45 y.o. male.  HPI This is a 45 year old man with cirrhosis, hep C, history of alcoholism who presents today complaining of diffuse abdominal pain he states that he has had increased swelling of his abdomen and lower extremities. He states he had vomiting twice yesterday that had some blood in it. He has had ongoing abdominal pain since yesterday. He has had poor appetite and has not been able to eat today. He denies any fever or chills. He states he quit drinking alcohol several years ago. Past Medical History:  Diagnosis Date  . Cirrhosis of liver (HCC)   . Gastric ulceration   . Hepatitis C   . Upper GI bleed     Patient Active Problem List   Diagnosis Date Noted  . Gastric ulceration   . Hepatitis, viral   . Chronic hepatitis C without hepatic coma (HCC)   . Cirrhosis (HCC) 07/14/2016  . Pain of upper abdomen 07/14/2016  . Gastritis     Past Surgical History:  Procedure Laterality Date  . ESOPHAGOGASTRODUODENOSCOPY N/A 07/16/2016   Procedure: ESOPHAGOGASTRODUODENOSCOPY (EGD);  Surgeon: Ruffin Frederick, MD;  Location: [facility] ENDOSCOPY;  Service: Gastroenterology;  Laterality: N/A;  . LUNG SURGERY         Home Medications    Prior to Admission medications   Medication Sig Start Date End Date Taking? Authorizing Provider  ondansetron (ZOFRAN) 4 MG tablet Take 1 tablet (4 mg total) by mouth every 8 (eight) hours as needed for nausea or vomiting. Patient not taking: Reported on 09/25/2016 07/17/16   Thomasene Lot, MD  oxyCODONE (ROXICODONE) 5 MG immediate release tablet Take 1 tablet (5 mg total) by mouth every 6 (six) hours as needed for severe pain. Patient not taking: Reported on 09/25/2016 07/17/16   Thomasene Lot, MD  pantoprazole (PROTONIX) 40 MG  tablet Take 1 tablet (40 mg total) by mouth 2 (two) times daily. Patient not taking: Reported on 09/25/2016 07/17/16   Thomasene Lot, MD  sucralfate (CARAFATE) 1 GM/10ML suspension Take 10 mLs (1 g total) by mouth every 6 (six) hours. Patient not taking: Reported on 09/25/2016 07/17/16   Thomasene Lot, MD    Family History No family history on file.  Social History Social History  Substance Use Topics  . Smoking status: Current Every Day Smoker    Packs/day: 0.50    Types: Cigarettes  . Smokeless tobacco: Never Used  . Alcohol use No     Allergies   Review of patient's allergies indicates no known allergies.   Review of Systems Review of Systems  All other systems reviewed and are negative.    Physical Exam Updated Vital Signs BP 133/96   Pulse 86   Temp 98 F (36.7 C) (Oral)   Resp 18   Ht 5\' 11"  (1.803 m)   Wt 68 kg   SpO2 97%   BMI 20.92 kg/m   Physical Exam  Constitutional: He is oriented to person, place, and time. He appears well-developed and well-nourished.  HENT:  Head: Normocephalic and atraumatic.  Eyes: Conjunctivae and EOM are normal. Pupils are equal, round, and reactive to light. Scleral icterus is present.  Neck: Normal range of motion. Neck supple.  Cardiovascular: Normal rate, regular rhythm, normal  heart sounds and intact distal pulses.   Pulmonary/Chest: Effort normal and breath sounds normal.  Abdominal: He exhibits distension. There is tenderness.  Diffusely distended abdomen with diffuse tenderness and fluid wave present  Musculoskeletal: He exhibits edema.  Neurological: He is alert and oriented to person, place, and time.  Skin: Skin is warm. Capillary refill takes less than 2 seconds.  Nursing note and vitals reviewed.    ED Treatments / Results  Labs (all labs ordered are listed, but only abnormal results are displayed) Labs Reviewed  COMPREHENSIVE METABOLIC PANEL - Abnormal; Notable for the following:       Result Value    Glucose, Bld 120 (*)    Calcium 8.6 (*)    Albumin 2.5 (*)    Alkaline Phosphatase 135 (*)    All other components within normal limits  CBC - Abnormal; Notable for the following:    Platelets 79 (*)    All other components within normal limits  URINALYSIS, ROUTINE W REFLEX MICROSCOPIC (NOT AT [facility]) - Abnormal; Notable for the following:    Hgb urine dipstick TRACE (*)    All other components within normal limits  PROTIME-INR - Abnormal; Notable for the following:    Prothrombin Time 16.1 (*)    All other components within normal limits  LACTATE DEHYDROGENASE, BODY FLUID - Abnormal; Notable for the following:    LD, Fluid 29 (*)    All other components within normal limits  RAPID URINE DRUG SCREEN, HOSP PERFORMED - Abnormal; Notable for the following:    Benzodiazepines POSITIVE (*)    All other components within normal limits  URINE MICROSCOPIC-ADD ON - Abnormal; Notable for the following:    Squamous Epithelial / LPF 0-5 (*)    Bacteria, UA RARE (*)    All other components within normal limits  CULTURE, BLOOD (ROUTINE X 2)  CULTURE, BLOOD (ROUTINE X 2)  CULTURE, BODY FLUID-BOTTLE  GRAM STAIN  LIPASE, BLOOD  CBC WITH DIFFERENTIAL/PLATELET  GLUCOSE, PERITONEAL FLUID  PROTEIN, BODY FLUID  ALBUMIN, FLUID  I-STAT CG4 LACTIC ACID, ED    EKG  EKG Interpretation None       Radiology Dg Chest Port 1 View  Result Date: 09/25/2016 CLINICAL DATA:  Dyspnea, cough, abdominal pain EXAM: PORTABLE CHEST 1 VIEW COMPARISON:  09/11/2016 FINDINGS: There are low lung volumes. There is bilateral diffuse interstitial thickening likely chronic. There is no focal parenchymal opacity. There is no pleural effusion or pneumothorax. The heart and mediastinal contours are unremarkable. The osseous structures are unremarkable. IMPRESSION: No active cardiopulmonary disease. Electronically Signed   By: Elige KoHetal  Patel   On: 09/25/2016 10:27    Procedures .Paracentesis Date/Time: 09/25/2016 4:05  PM Performed by: Margarita GrizzleAY, Islah Eve Authorized by: Margarita GrizzleAY, Verlyn Dannenberg   Consent:    Consent obtained:  Written   Consent given by:  Patient   Risks discussed:  Bleeding, bowel perforation and infection   Alternatives discussed:  No treatment Pre-procedure details:    Procedure purpose:  Diagnostic   Preparation: Patient was prepped and draped in usual sterile fashion   Anesthesia (see MAR for exact dosages):    Anesthesia method:  Local infiltration   Local anesthetic:  Lidocaine 1% WITH epi Procedure details:    Needle gauge:  20   Ultrasound guidance: no     Puncture site:  Midline infraumbilical   Fluid removed amount:  70 cc   Fluid appearance:  Serosanguinous   Dressing:  4x4 sterile gauze Post-procedure details:    Patient  tolerance of procedure:  Tolerated well, no immediate complications   (including critical care time)  Medications Ordered in ED Medications  morphine 2 MG/ML injection 2 mg (2 mg Intravenous Given 09/25/16 1240)  ondansetron (ZOFRAN) injection 4 mg (4 mg Intravenous Given 09/25/16 1238)  lidocaine-EPINEPHrine (XYLOCAINE W/EPI) 2 %-1:100000 (with pres) injection 10 mL (10 mLs Infiltration Given by Other 09/25/16 1500)  cefTRIAXone (ROCEPHIN) 2 g in dextrose 5 % 50 mL IVPB (0 g Intravenous Stopped 09/25/16 1416)  metroNIDAZOLE (FLAGYL) IVPB 500 mg (0 mg Intravenous Stopped 09/25/16 1345)  morphine 2 MG/ML injection 2 mg (2 mg Intravenous Given 09/25/16 1326)  morphine 2 MG/ML injection 2 mg (2 mg Intravenous Given 09/25/16 1532)     Initial Impression / Assessment and Plan / ED Course  I have reviewed the triage vital signs and the nursing notes.  Pertinent labs & imaging results that were available during my care of the patient were reviewed by me and considered in my medical decision making (see chart for details).  Clinical Course   45 y.o.male with liver failure, chronic hep B and C infection presents today with increasing abdominal distention and pain.  Evaluated here for SBP. Covered bodyaches and paracentesis performed with a 60 mL's of fluid removed. Discussed with hospitalist on-call, Dr. Melynda Ripple and he will admit.   Final Clinical Impressions(s) / ED Diagnoses   Final diagnoses:  Generalized abdominal pain  Other ascites    New Prescriptions New Prescriptions   No medications on file     Margarita Grizzle, MD 09/25/16 1610

## 2016-09-25 NOTE — ED Notes (Signed)
IV team members stated unable to obtain IV access.

## 2016-09-25 NOTE — ED Notes (Signed)
Verified with previous RN and other pod staff that pt has NOT been intubated during this ED admission.

## 2016-09-25 NOTE — ED Notes (Signed)
Admitting MD at the bedside.  

## 2016-09-25 NOTE — ED Notes (Signed)
Attempted IV access x 2 unable to obtain IV or blood draw.  Patient states is hard stick and takes multiple attempts to gain access.

## 2016-09-25 NOTE — Consult Note (Addendum)
[facility] Consult: 6:33 PM 09/25/2016  LOS: 0 days    Referring Provider: Dr Jarvis Newcomer  Primary Care Physician:  No PCP Per Patient Primary Gastroenterologist: unassigned - was to see Armbruster in f/u but hasn't   [facility] Attending   I have taken an interval history, reviewed the chart and examined the patient. I agree with the Advanced Practitioner's note, impression and recommendations.   Complicated patient w/ HBV/HCV and cirrhosis w/ hx polysubstance abuse. Decompensated cirrhosis with volume overload and severe ascites. Has a giant gastric ulcer dx 07/2016. ? Of hematemesis here. He is non-compliant with treatment and f/u. Poor social situation. Recipe for continued problems/disaster.  Needs symptomatic relief with a large vol paracentesis as below.  I am not certain he needs an EGD this time based upon hx and Hg.  - will see what happens with Hgb, stools and any more vomiting. He could have had slight hematemesis from the ulcer but unless he shows signs major bleeding will tx w/ PPI alone.  He may need SNF in order to improve and survive.   Will reassess HBV status - probably chronic but will repeat serologies and check HAV immunity.  Iva Boop, MD, [facility] [phone] (pager) [phone] after 5 PM, weekends and holidays  09/25/2016 6:33 PM     IMPRESSION:   *  Ascites.  Abdominal pain. Rule out SBP.  *  Possible hematemesis, versus (patient's explanation) vomiting. Watermelon. He had a large gastric ulcer diagnosed at endoscopy in early 07/2016 which is likely still present so possibility of hematemesis is high.  *  Cirrhosis. Hep B and C positive.  *  Polysubstance abuse.    PLAN:     *  Proceed with large volume paracentesis and replace with IV  albumin infusion.  Make sure to send studies for cell count and differential in order to rule out SBP.  *  Would empirically start him on Protonix 40 mg twice a day, I ordered this. Given that his vital signs are stable and he is not anemic, do not feel he needs PPI IV or PPI drip.  *  Has been empirically treated with Rocephin, this should be continued. I don't think he needs ongoing Flagyl, but he did receive a dose of this in the emergency room.  *  Suspect he will undergo upper endoscopy by Dr. Leone Payor this weekend so I will place orders in [facility] [place].  *  He can have heart healthy diet, I see that this has been ordered.   Jennye Moccasin  09/25/2016, 4:28 PM Pager: [phone]    Reason for Consultation:  Abdominal pain, ascites and patient reported hematemesis.   HPI: Dan [name] is a 45 y.o. male.  PMH chronic pain, polysubstance/IV drug abuse. 2012 lung abscess, empyema status post thoracotomy.  Cirrhosis diagnosis made based on CT scan of 81/17 which showed cirrhosis, splenomegaly, portal hypertension, probable esophageal varices, large volume ascites. Also showed gastric edema and edema in the distal esophagus.  He is hep C positive with a viral count  of 53, 500.  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 Thrombocytopenia.  Evaluated during early August/2017 admission for hematemesis. EGD 07/16/16 by Dr. Adela LankArmbruster. This showed large proximal stomach ulceration, portal hypertensive gastropathy. No esophageal varices. Gastric biopsy pathology showed unremarkable gastric type mucosa. No H. pylori, dysplasia or malignancy. He did not require blood transfusion during that admission as hemoglobin ranged between 12.3 and 13.4.  At discharge patient had prescription for Protonix 40 mg twice a day, sucralfate, Zofran, oxycodone. He read the disclaimer on the Carafate which said it could cause bloating so he stopped taking it. He took the Protonix until it ran out which is  probably a month's course of this medication. Patient had an appointment set up for 07/1116 to follow up with the GI office but he did not show up. He was also supposed to contact the infectious disease clinic for follow-up but does not look like he has been seen there either.  Patient denies any recent illicit drug use or alcohol consumption. He is living with his 47 year old mother. She is not able to drive him to appointments  He is in the ED today with about a 5 week history of abdominal pain and swelling along with lower extremity edema. The pain is diffuse. He has been eating well. He took Percocet for the pain. Denies use of NSAIDs. He has had some blackish red emesis after eating watermelon last night. He hasn't had any recurrence of emesis today. He thinks that the red color was due to the watermelon.. No dizziness or falls. Denies unusual bleeding from the nose, mouth or skin. Bowel movement this morning was formed and brown. Today's hemoglobin is 13.7. Platelets are 79K (as low as 55 on 07/14/16).  Lipase is normal.  Alkaline phosphatase elevated at 135 but normal transaminases and total bilirubin. His vital signs are stable. Paracentesis is in process. An initial sample was removed and sent for chemistries. Albumin from fluid is less than 1.  I don't see any results pertaining to cell count are differential on the fluid. 3 large Vacutainer bottles are at bedside in the emergency room so looks like large volume paracentesis is forthcoming.   Past Medical History:  Diagnosis Date  . Cirrhosis of liver (HCC)   . Gastric ulceration   . Hepatitis B   . Hepatitis C   . Polysubstance abuse   . Upper GI bleed     Past Surgical History:  Procedure Laterality Date  . ESOPHAGOGASTRODUODENOSCOPY N/A 07/16/2016   Procedure: ESOPHAGOGASTRODUODENOSCOPY (EGD);  Surgeon: Ruffin FrederickSteven Paul Armbruster, MD;  [facility];  Service: Gastroenterology;  Laterality: N/A;  . LUNG SURGERY      Prior to  Admission medications   Medication Sig Start Date End Date Taking? Authorizing Provider  ondansetron (ZOFRAN) 4 MG tablet Take 1 tablet (4 mg total) by mouth every 8 (eight) hours as needed for nausea or vomiting. Patient not taking: Reported on 09/25/2016 07/17/16   Thomasene LotJames Taylor, MD  oxyCODONE (ROXICODONE) 5 MG immediate release tablet Take 1 tablet (5 mg total) by mouth every 6 (six) hours as needed for severe pain. Patient not taking: Reported on 09/25/2016 07/17/16   Thomasene LotJames Taylor, MD  pantoprazole (PROTONIX) 40 MG tablet Take 1 tablet (40 mg total) by mouth 2 (two) times daily. Patient not taking: Reported on 09/25/2016 07/17/16   Thomasene LotJames Taylor, MD  sucralfate (CARAFATE) 1 GM/10ML suspension Take 10 mLs (1 g total) by mouth every 6 (six)  hours. Patient not taking: Reported on 09/25/2016 07/17/16   Thomasene Lot, MD       Allergies as of 09/25/2016  . (No Known Allergies)   FHx: Mother alive at 65   Social History   Social History  . Marital status: Single    Spouse name: N/A  . Number of children: N/A  . Years of education: N/A   Occupational History  . Not on file.   Social History Main Topics  . Smoking status: Current Every Day Smoker    Packs/day: 0.50    Types: Cigarettes  . Smokeless tobacco: Never Used  . Alcohol use No  . Drug use: No  . Sexual activity: Not on file   Other Topics Concern  . Not on file   Social History Narrative  . No narrative on file    REVIEW OF SYSTEMS: Constitutional:  Some weakness and fatigue, neither of which is profound. ENT:  No nose bleeds Pulm:  Stable dyspnea with her long to exertion. Smoker's cough, occasionally produces frothy white sputum. CV:  No palpitations, no LE edema.  GU:  No hematuria, no frequency GI:  Per HPI Heme:  Per HPI   Transfusions:  None ever Neuro:  No headaches, no peripheral tingling or numbness Derm:  No itching, no rash or sores.  Endocrine:  No sweats or chills.  No polyuria or  dysuria Immunization:  Did not inquire. Viewed immunizations and not see where he's had any recent immunization. Travel:  None beyond local counties in last few months.    PHYSICAL EXAM: Vital signs in last 24 hours: Vitals:   09/25/16 1745 09/25/16 1815  BP: 143/87 129/94  Pulse: 63   Resp: (!) 30 20  Temp:     Wt Readings from Last 3 Encounters:  09/25/16 150 lb (68 kg)  07/14/16 163 lb 12.8 oz (74.3 kg)    General: Ill looking, malnourished appearing WF who looks at least 15 years older than stated age. He is sallow. Head:  No facial asymmetry or swelling. Some temporal wasting.  Eyes:  No scleral icterus or conjunctival pallor. Eyes appear a bit sunken in the orbits. Ears:  Not hard of hearing  Nose:  No congestion or discharge. Mouth:  Moist and clear mucous membranes. Poor dentition. Neck:  No JVD or masses. Lungs:  Clear bilaterally. No cough. No labored breathing. Heart: RRR. No MRG. S1, S2 present. Abdomen:  Markedly distended/protuberant. Small ventral hernia a few inches superior to the umbilicus. Mild tenderness but no guarding or rebound. Bowel sounds distant but active..   Rectal: Deferred   Musc/Skeltl: No joint erythema, swelling or gross deformity. Extremities:  Trace pedal edema with 2+ pedal pulses.  Neurologic:  Fully alert and oriented 3. No asterixis, no limb weakness. Skin:  Sallow/ashen coloring. Some telangiectasia on the trunk. Psych:    Intake/Output from previous day: No intake/output data recorded. Intake/Output this shift: Total I/O In: 100 [IV Piggyback:100] Out: 225 [Urine:225]  LAB RESULTS:  Recent Labs  09/25/16 0944 09/25/16 1006  WBC 6.7 DUPLICATE SEE F63540  HGB 13.7 DUPLICATE SEE F63540  HCT 41.4 DUPLICATE SEE F63540  PLT 79* DUPLICATE SEE F63540   BMET Lab Results  Component Value Date   NA 139 09/25/2016   NA 136 07/15/2016   NA 133 (L) 07/14/2016   K 4.3 09/25/2016   K 4.2 07/15/2016   K 4.2 07/14/2016   CL 106  09/25/2016   CL 107 07/15/2016   CL 101  07/14/2016   CO2 28 09/25/2016   CO2 24 07/15/2016   CO2 25 07/14/2016   GLUCOSE 120 (H) 09/25/2016   GLUCOSE 126 (H) 07/15/2016   GLUCOSE 177 (H) 07/14/2016   BUN 6 09/25/2016   BUN 18 07/15/2016   BUN 20 07/14/2016   CREATININE 0.89 09/25/2016   CREATININE 0.83 07/15/2016   CREATININE 0.61 07/14/2016   CALCIUM 8.6 (L) 09/25/2016   CALCIUM 7.6 (L) 07/15/2016   CALCIUM 7.9 (L) 07/14/2016   LFT  Recent Labs  09/25/16 0944  PROT 7.2  ALBUMIN 2.5*  AST 38  ALT 26  ALKPHOS 135*  BILITOT 0.4   PT/INR Lab Results  Component Value Date   INR 1.28 09/25/2016   INR 1.58 07/14/2016   Lipase     Component Value Date/Time   LIPASE 27 09/25/2016 0944    Drugs of Abuse     Component Value Date/Time   LABOPIA NONE DETECTED 09/25/2016 1152   COCAINSCRNUR NONE DETECTED 09/25/2016 1152   LABBENZ POSITIVE (A) 09/25/2016 1152   AMPHETMU NONE DETECTED 09/25/2016 1152   THCU NONE DETECTED 09/25/2016 1152   LABBARB NONE DETECTED 09/25/2016 1152     RADIOLOGY STUDIES: Dg Chest Port 1 View  Result Date: 09/25/2016 CLINICAL DATA:  Dyspnea, cough, abdominal pain EXAM: PORTABLE CHEST 1 VIEW COMPARISON:  09/11/2016 FINDINGS: There are low lung volumes. There is bilateral diffuse interstitial thickening likely chronic. There is no focal parenchymal opacity. There is no pleural effusion or pneumothorax. The heart and mediastinal contours are unremarkable. The osseous structures are unremarkable. IMPRESSION: No active cardiopulmonary disease. Electronically Signed   By: Elige Ko   On: 09/25/2016 10:27   I have reviewed EGD images, prior imaging studies images from 07/2016 (CT) and GI notes, DC summary and labs from 8/17 admit

## 2016-09-25 NOTE — ED Notes (Signed)
Attempted to call report

## 2016-09-25 NOTE — ED Notes (Signed)
IV team at bedside 

## 2016-09-26 ENCOUNTER — Encounter (HOSPITAL_COMMUNITY)
Admission: EM | Disposition: A | Discharge status: Home / Self Care | Payer: Self-pay | Source: Home / Self Care | Attending: Internal Medicine

## 2016-09-26 DIAGNOSIS — K744 Secondary biliary cirrhosis: Secondary | ICD-10-CM

## 2016-09-26 DIAGNOSIS — K920 Hematemesis: Secondary | ICD-10-CM

## 2016-09-26 LAB — CBC
HEMATOCRIT: 36.1 % — AB (ref 39.0–52.0)
Hemoglobin: 11.5 g/dL — ABNORMAL LOW (ref 13.0–17.0)
MCH: 29.8 pg (ref 26.0–34.0)
MCHC: 31.9 g/dL (ref 30.0–36.0)
MCV: 93.5 fL (ref 78.0–100.0)
Platelets: 76 10*3/uL — ABNORMAL LOW (ref 150–400)
RBC: 3.86 MIL/uL — ABNORMAL LOW (ref 4.22–5.81)
RDW: 15 % (ref 11.5–15.5)
WBC: 5.9 10*3/uL (ref 4.0–10.5)

## 2016-09-26 LAB — COMPREHENSIVE METABOLIC PANEL
ALT: 22 U/L (ref 17–63)
ANION GAP: 5 (ref 5–15)
AST: 29 U/L (ref 15–41)
Albumin: 2 g/dL — ABNORMAL LOW (ref 3.5–5.0)
Alkaline Phosphatase: 109 U/L (ref 38–126)
BILIRUBIN TOTAL: 0.7 mg/dL (ref 0.3–1.2)
BUN: 6 mg/dL (ref 6–20)
CO2: 28 mmol/L (ref 22–32)
Calcium: 7.7 mg/dL — ABNORMAL LOW (ref 8.9–10.3)
Chloride: 104 mmol/L (ref 101–111)
Creatinine, Ser: 0.84 mg/dL (ref 0.61–1.24)
Glucose, Bld: 142 mg/dL — ABNORMAL HIGH (ref 65–99)
POTASSIUM: 3.8 mmol/L (ref 3.5–5.1)
Sodium: 137 mmol/L (ref 135–145)
TOTAL PROTEIN: 6.4 g/dL — AB (ref 6.5–8.1)

## 2016-09-26 LAB — GRAM STAIN

## 2016-09-26 SURGERY — EGD (ESOPHAGOGASTRODUODENOSCOPY)
Anesthesia: Moderate Sedation

## 2016-09-26 MED ORDER — INFLUENZA VAC SPLIT QUAD 0.5 ML IM SUSY
0.5000 mL | PREFILLED_SYRINGE | INTRAMUSCULAR | Status: AC
  Administered 2016-10-01: 0.5 mL via INTRAMUSCULAR
  Filled 2016-09-26: qty 0.5

## 2016-09-26 MED ORDER — MORPHINE SULFATE (PF) 2 MG/ML IV SOLN
INTRAVENOUS | Status: AC
  Filled 2016-09-26: qty 1

## 2016-09-26 MED ORDER — HYDROMORPHONE HCL 2 MG/ML IJ SOLN
1.0000 mg | INTRAMUSCULAR | Status: DC | PRN
  Administered 2016-09-26: 1 mg via INTRAVENOUS

## 2016-09-26 MED ORDER — MORPHINE SULFATE (PF) 2 MG/ML IV SOLN
1.0000 mg | INTRAVENOUS | Status: DC | PRN
  Administered 2016-09-26: 1 mg via INTRAVENOUS

## 2016-09-26 MED ORDER — SPIRONOLACTONE 25 MG PO TABS
25.0000 mg | ORAL_TABLET | Freq: Two times a day (BID) | ORAL | Status: DC
  Administered 2016-09-26 – 2016-09-28 (×4): 25 mg via ORAL
  Filled 2016-09-26 (×4): qty 1

## 2016-09-26 MED ORDER — HYDROMORPHONE HCL 1 MG/ML IJ SOLN
1.0000 mg | Freq: Once | INTRAMUSCULAR | Status: AC
  Administered 2016-09-26: 1 mg via INTRAVENOUS
  Filled 2016-09-26: qty 1

## 2016-09-26 MED ORDER — LACTULOSE 10 GM/15ML PO SOLN
20.0000 g | Freq: Every day | ORAL | Status: DC
  Administered 2016-09-26 – 2016-10-01 (×6): 20 g via ORAL
  Filled 2016-09-26 (×6): qty 30

## 2016-09-26 MED ORDER — HYDROMORPHONE HCL 1 MG/ML IJ SOLN
INTRAMUSCULAR | Status: AC
  Filled 2016-09-26: qty 1

## 2016-09-26 MED ORDER — ALBUMIN HUMAN 25 % IV SOLN
12.5000 g | Freq: Once | INTRAVENOUS | Status: DC
  Filled 2016-09-26: qty 50

## 2016-09-26 MED ORDER — MORPHINE SULFATE (PF) 2 MG/ML IV SOLN
2.0000 mg | INTRAVENOUS | Status: DC | PRN
  Filled 2016-09-26: qty 1

## 2016-09-26 MED ORDER — HYDROMORPHONE HCL 1 MG/ML IJ SOLN
1.0000 mg | INTRAMUSCULAR | Status: DC | PRN
  Administered 2016-09-26 – 2016-09-29 (×23): 1 mg via INTRAVENOUS
  Filled 2016-09-26 (×23): qty 1

## 2016-09-26 NOTE — Progress Notes (Signed)
PROGRESS NOTE    Dan Alphaony W Ky  WUJ:[id number] DOB: 01/15/1971 DOA: 09/25/2016 PCP: No PCP Per Patient  Brief Narrative: Dan [name] is a 45 y.o. 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 He reports chronic protuberant abdomen that has enlarged over the past 24 hours. This has been accompanied by aching pain throughout the abdomen, nonradiating, severe, constant, associated with nausea and vomiting x2 last night with red blood in it. He has taken no medications, including NSAIDs. Denies alcohol use. He was admitted in August for hematemesis found to have cirrhosis and giant gastric ulcer on EGD. He was to take PPI and carafate and [facility] for repeat EGD, and [facility] but never did. On arrival to the ED he was in no distress, afebrile with normal vital signs. A distended, protuberant abdomen with fluid wave was noted and diagnostic paracentesis was performed in the ED. Antibiotics were started and [facility] called to admit. Complete metabolic panel largely unremarkable, albumin 2.5. CBC abnormal with platelets 79. Lactate normal, UA uninfected, CXR neg, UDS + benzos. Ascitic fluid analysis pending.   Review of Systems: Denies fever, chills, weight loss, changes in vision or hearing, headache, cough, sore throat, chest pain, palpitations, shortness of breath, changes in bowel habits, melena, blood in stool, change in bladder habits, myalgias, arthralgias, and rash. And per HPI. All others reviewed and are negative.     Assessment & Plan:   Active Problems:   Cirrhosis (HCC)   Hematemesis   Chronic giant gastric ulcer   Hepatitis B infection without delta agent without hepatic coma   Chronic hepatitis C without hepatic coma (HCC)   SBP (spontaneous bacterial peritonitis) (HCC)   Spontaneous bacterial peritonitis (HCC)  Acute abdominal pain in chronic cirrhotic patient.  Nontoxic. Rule out SBP - Paracentesis performed in ED, no significant WBC, follow culture ascites  fluid.  - Follow up  LD, glucose, gram stain negative, culture - Continue with ceftriaxone.  -IR consulted for large volume paracentesis.   Hematemesis without anemia: With history of EGD Aug 2017 showing very large gastric ulcer by Dr. Adela LankArmbruster. Plan was for repeat EGD in 6 weeks. Nonadherent to PPI and carafate.  - Clear liquids for now - hb at 11, follow trend. Denies further hematemesis.  -Protonix BID.   hepatic cirrhosis with ascites: Due to chronic viral hepatitis and history of alcohol abuse. albumin 2.5, INR 1.28. Stable and asymptomatic thrombocytopenia, no hyponatremia.  Paracentesis ordered.  Will give albumin after paracentesis. 10 gr for each L of fluid removed.  Will start spironolactone low dose.  Will also start lactulose.   Chronic hepatitis B and C: Hep C genotype 1a, quant 53k. + Hep B titers in Aug 2017.  - [facility].     DVT prophylaxis: scd. Code Status: Full code.  Family Communication: care discussed with patient  Disposition Plan: remain inpatient.    Consultants:   GI, Dr Leone PayorGessner.    Procedures:   Paracentesis    Antimicrobials:   ceftriaxone 10-13   Subjective: Complaining of abdominal pain/    Objective: Vitals:   09/25/16 2000 09/25/16 2030 09/25/16 2115 09/26/16 0547  BP: 131/89 120/93 123/84 116/79  Pulse: (!) 40 76 73 81  Resp: 19 14 19 18   Temp:   98 F (36.7 C) 98 F (36.7 C)  TempSrc:   Oral Oral  SpO2: 95% 95% 98% 95%  Weight:   79.2 kg (174 lb 9.6 oz)   Height:   5\' 11"  (1.803 m)     Intake/Output Summary (Last 24 hours) at 09/26/16 1100 Last data filed at 09/26/16 1059  Gross per 24 hour  Intake              440 ml  Output              575 ml  Net             -135 ml   Filed Weights   09/25/16 0933 09/25/16 2115  Weight: 68 kg (150 lb) 79.2 kg (174 lb 9.6  oz)    Examination:  General exam: Appears calm and comfortable  Respiratory system: Clear to auscultation. Respiratory effort normal. Cardiovascular system: S1 & S2 heard, RRR. No JVD, murmurs, rubs, gallops or clicks. No pedal edema. Gastrointestinal system: Abdomen is nondistended, soft and nontender. No organomegaly or masses felt. Normal bowel sounds heard. Central nervous system: Alert and oriented. No focal neurological deficits. Extremities: Symmetric 5 x 5 power. Skin: No rashes, lesions or ulcers Psychiatry: Judgement and insight appear normal. Mood & affect appropriate.     Data Reviewed: I have personally reviewed following labs and imaging studies  CBC:  Recent Labs Lab 09/25/16 0944 09/25/16 1006 09/26/16 0733  WBC 6.7 DUPLICATE SEE F63540 5.9  NEUTROABS  --  5.1  --   HGB 13.7 DUPLICATE SEE F63540 11.5*  HCT 41.4 DUPLICATE SEE F63540 36.1*  MCV 92.0 DUPLICATE SEE F63540 93.5  PLT 79* DUPLICATE SEE F63540 76*   Basic Metabolic Panel:  Recent Labs Lab 09/25/16 0944 09/26/16 0733  NA 139 137  K 4.3 3.8  CL 106 104  CO2 28 28  GLUCOSE 120* 142*  BUN 6 6  CREATININE 0.89 0.84  CALCIUM 8.6* 7.7*   GFR: Estimated Creatinine Clearance: 115.8 mL/min (by C-G formula based on SCr of 0.84 mg/dL). Liver Function Tests:  Recent Labs Lab 09/25/16 0944 09/26/16 0733  AST 38 29  ALT 26 22  ALKPHOS 135* 109  BILITOT 0.4 0.7  PROT 7.2 6.4*  ALBUMIN 2.5* 2.0*    Recent Labs Lab 09/25/16 0944  LIPASE 27   No results for input(s): AMMONIA in the last 168 hours. Coagulation Profile:  Recent Labs Lab 09/25/16 1006  INR 1.28   Cardiac Enzymes: No results for input(s): CKTOTAL, CKMB, CKMBINDEX, TROPONINI in the last 168 hours. BNP (last 3 results) No results for input(s): PROBNP in the last 8760 hours. HbA1C: No results for input(s): HGBA1C in the last 72 hours. CBG: No results for input(s): GLUCAP in the last 168 hours. Lipid Profile: No  results for input(s): CHOL, HDL, LDLCALC, TRIG, CHOLHDL, LDLDIRECT in the last 72 hours. Thyroid Function Tests: No results for input(s): TSH, T4TOTAL, FREET4, T3FREE, THYROIDAB in the last 72 hours. Anemia Panel: No results for input(s): VITAMINB12, FOLATE, FERRITIN, TIBC, IRON, RETICCTPCT in the last 72 hours. Sepsis Labs:  Recent Labs Lab 09/25/16 1017  LATICACIDVEN 1.65    Recent Results (from the past 240 hour(s))  Gram stain     Status: None   Collection Time: 09/25/16  3:05 PM  Result Value Ref Range Status   Specimen Description PERITONEAL  Final   Special Requests NONE  Final   Gram Stain   Final    FEW WBC PRESENT,BOTH PMN AND MONONUCLEAR NO ORGANISMS SEEN    Report  Status 09/26/2016 FINAL  Final         Radiology Studies: Dg Chest Port 1 View  Result Date: 09/25/2016 CLINICAL DATA:  Dyspnea, cough, abdominal pain EXAM: PORTABLE CHEST 1 VIEW COMPARISON:  09/11/2016 FINDINGS: There are low lung volumes. There is bilateral diffuse interstitial thickening likely chronic. There is no focal parenchymal opacity. There is no pleural effusion or pneumothorax. The heart and mediastinal contours are unremarkable. The osseous structures are unremarkable. IMPRESSION: No active cardiopulmonary disease. Electronically Signed   By: Elige Ko   On: 09/25/2016 10:27        Scheduled Meds: . cefTRIAXone (ROCEPHIN)  IV  2 g Intravenous Q24H  . [START ON 09/27/2016] Influenza vac split quadrivalent PF  0.5 mL Intramuscular Tomorrow-1000  . pantoprazole  40 mg Oral BID  . sodium chloride flush  3 mL Intravenous Q12H  . sucralfate  1 g Oral TID AC & HS   Continuous Infusions:    LOS: 1 day    Time spent: 35 minutes.     Alba Cory, MD [facility] Pager [phone]  If 7PM-7AM, please contact night-coverage [email or web address] Password [email or web address] 09/26/2016, 11:00 AM

## 2016-09-26 NOTE — Progress Notes (Signed)
   Patient Name: Dan [name] Date of Encounter: 09/26/2016, 2:53 PM    Assessment and Plan  Decompensated HBV/HCV (atleast) cirrhosis  Giant gastric ulcer 07/2016 EGD Decreased Hgb, scanty hematemesis??? Non-compliance/poor social situation  Awaiting large vol paracentesis would take up to 8L and give 10g albumin per liter removed after as discussed w/ hospitalizt by phone earlier today Will follow-up After paracentesis consider an EGD pending clinical course not sure it will change Tx plan  Iva Booparl E. Aadam Zhen, MD, [facility] [phone] (pager)   Subjective  Has not had large vol paracentesis yet hgb has decreased slightly   Objective  BP 122/79 (BP Location: Right Arm)   Pulse 70   Temp 97.9 F (36.6 C) (Oral)   Resp 16   Ht 5\' 11"  (1.803 m)   Wt 174 lb 9.6 oz (79.2 kg)   SpO2 97%   BMI 24.35 kg/m   Protuberant abdomen w/ ascites CBC Latest Ref Rng & Units 09/26/2016 09/25/2016 09/25/2016  WBC 4.0 - 10.5 K/uL 5.9 DUPLICATE SEE F63540 6.7  Hemoglobin 13.0 - 17.0 g/dL 11.5(L) DUPLICATE SEE [id number] 13.7  Hematocrit 39.0 - 52.0 % 36.1(L) DUPLICATE SEE F63540 41.4  Platelets 150 - 400 K/uL 76(L) DUPLICATE SEE [id number] 79(L)      09/26/2016 2:53 PM

## 2016-09-27 ENCOUNTER — Inpatient Hospital Stay (HOSPITAL_COMMUNITY): Payer: Medicaid Other

## 2016-09-27 LAB — BASIC METABOLIC PANEL
ANION GAP: 3 — AB (ref 5–15)
BUN: 12 mg/dL (ref 6–20)
CO2: 28 mmol/L (ref 22–32)
Calcium: 7.7 mg/dL — ABNORMAL LOW (ref 8.9–10.3)
Chloride: 105 mmol/L (ref 101–111)
Creatinine, Ser: 0.84 mg/dL (ref 0.61–1.24)
Glucose, Bld: 125 mg/dL — ABNORMAL HIGH (ref 65–99)
POTASSIUM: 4.1 mmol/L (ref 3.5–5.1)
SODIUM: 136 mmol/L (ref 135–145)

## 2016-09-27 LAB — CBC
HCT: 35.5 % — ABNORMAL LOW (ref 39.0–52.0)
Hemoglobin: 11.4 g/dL — ABNORMAL LOW (ref 13.0–17.0)
MCH: 30 pg (ref 26.0–34.0)
MCHC: 32.1 g/dL (ref 30.0–36.0)
MCV: 93.4 fL (ref 78.0–100.0)
PLATELETS: 74 10*3/uL — AB (ref 150–400)
RBC: 3.8 MIL/uL — AB (ref 4.22–5.81)
RDW: 14.9 % (ref 11.5–15.5)
WBC: 6.4 10*3/uL (ref 4.0–10.5)

## 2016-09-27 LAB — HEPATITIS A ANTIBODY, TOTAL: Hep A Total Ab: NEGATIVE

## 2016-09-27 LAB — HEPATITIS B SURFACE ANTIBODY, QUANTITATIVE: Hepatitis B-Post: <3.1 m[IU]/mL — ABNORMAL LOW (ref >9.9)

## 2016-09-27 LAB — HEPATITIS B CORE ANTIBODY, TOTAL: Hep B Core Total Ab: POSITIVE — AB

## 2016-09-27 LAB — HEPATITIS B E ANTIGEN: HEP B E AG: POSITIVE — AB

## 2016-09-27 MED ORDER — ALBUMIN HUMAN 25 % IV SOLN
50.0000 g | Freq: Once | INTRAVENOUS | Status: AC
  Administered 2016-09-27: 50 g via INTRAVENOUS
  Filled 2016-09-27: qty 200

## 2016-09-27 MED ORDER — LIDOCAINE HCL (PF) 1 % IJ SOLN
INTRAMUSCULAR | Status: AC
  Filled 2016-09-27: qty 10

## 2016-09-27 NOTE — Procedures (Signed)
Successful US guided paracentesis from right lateral abdomen.  Yielded 5 liters of clear yellow fluid.  No immediate complications.  Pt tolerated well.   Bee Marchiano S Lanetra Hartley PA-C 09/27/2016 10:05 AM

## 2016-09-27 NOTE — Progress Notes (Signed)
PROGRESS NOTE    Dan [name]  YNW:[id number] DOB: 1971-04-20 DOA: 09/25/2016 PCP: No PCP Per Patient  Brief Narrative: Dan [name] is a 45 y.o. 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 He reports chronic protuberant abdomen that has enlarged over the past 24 hours. This has been accompanied by aching pain throughout the abdomen, nonradiating, severe, constant, associated with nausea and vomiting x2 last night with red blood in it. He has taken no medications, including NSAIDs. Denies alcohol use. He was admitted in August for hematemesis found to have cirrhosis and giant gastric ulcer on EGD. He was to take PPI and carafate and follow up at [facility] for repeat EGD, and to establish care at the [facility] but never did. On arrival to the ED he was in no distress, afebrile with normal vital signs. A distended, protuberant abdomen with fluid wave was noted and diagnostic paracentesis was performed in the ED. Antibiotics were started and [facility] called to admit. Complete metabolic panel largely unremarkable, albumin 2.5. CBC abnormal with platelets 79. Lactate normal, UA uninfected, CXR neg, UDS + benzos. Ascitic fluid analysis pending.   Review of Systems: Denies fever, chills, weight loss, changes in vision or hearing, headache, cough, sore throat, chest pain, palpitations, shortness of breath, changes in bowel habits, melena, blood in stool, change in bladder habits, myalgias, arthralgias, and rash. And per HPI. All others reviewed and are negative.     Assessment & Plan:   Active Problems:   Cirrhosis (HCC)   Hematemesis   Chronic giant gastric ulcer   Hepatitis B infection without delta agent without hepatic coma   Chronic hepatitis C without hepatic coma (HCC)   SBP (spontaneous bacterial peritonitis) (HCC)   Spontaneous bacterial peritonitis (HCC)  Acute abdominal pain in chronic cirrhotic patient.  Nontoxic. Rule out SBP - Paracentesis performed in ED, no significant WBC, follow culture ascites  fluid.  - Follow up  LD, glucose, gram stain negative, culture no growth to date.  - Continue with ceftriaxone.  -IR consulted for large volume paracentesis. Albumin ordered after paracentesis.   Hematemesis without anemia: With history of EGD Aug 2017 showing very large gastric ulcer by Dr. Adela Lank. Plan was for repeat EGD in 6 weeks. Nonadherent to PPI and carafate.  - Clear liquids for now - hb at 11, follow trend. Denies further hematemesis.  -Protonix BID.  -hb stable.   Hepatic Cirrhosis with Ascites: Due to chronic viral hepatitis and history of alcohol abuse. albumin 2.5, INR 1.28. Stable and asymptomatic thrombocytopenia.  S/p Paracentesis 10-15 Will give albumin after paracentesis. 10 gr for each L of fluid removed.  Started  spironolactone low dose.  Started  Lactulose daily.   Chronic hepatitis B and C: Hep C genotype 1a, quant 53k. + Hep B titers in Aug 2017.  - Recommend outpatient follow up at [facility] to consider treatment.     DVT prophylaxis: scd. Code Status: Full code.  Family Communication: care discussed with patient  Disposition Plan: remain inpatient.    Consultants:   GI, Dr Leone Payor.    Procedures:   Paracentesis    Antimicrobials:   ceftriaxone 10-13   Subjective: Complaining of abdominal pain/    Objective: Vitals:   09/27/16 0937 09/27/16 0940 09/27/16 0949 09/27/16 1241  BP: 119/77 124/78 123/81 120/78  Pulse:    87  Resp:      Temp:  98.6 F (37 C)  TempSrc:    Oral  SpO2:    95%  Weight:      Height:        Intake/Output Summary (Last 24 hours) at 09/27/16 1426 Last data filed at 09/26/16 1900  Gross per 24 hour  Intake              290 ml  Output                0 ml  Net              290 ml   Filed Weights   09/25/16 0933 09/25/16 2115  Weight: 68 kg (150 lb) 79.2 kg (174 lb 9.6 oz)    Examination:  General  exam: Appears calm and comfortable  Respiratory system: Clear to auscultation. Respiratory effort normal. Cardiovascular system: S1 & S2 heard, RRR. No JVD, murmurs, rubs, gallops or clicks. No pedal edema. Gastrointestinal system: Abdomen is nondistended, soft and nontender. No organomegaly or masses felt. Normal bowel sounds heard. Central nervous system: Alert and oriented. No focal neurological deficits. Extremities: Symmetric 5 x 5 power. Skin: No rashes, lesions or ulcers Psychiatry: Judgement and insight appear normal. Mood & affect appropriate.     Data Reviewed: I have personally reviewed following labs and imaging studies  CBC:  Recent Labs Lab 09/25/16 0944 09/25/16 1006 09/26/16 0733 09/27/16 0501  WBC 6.7 DUPLICATE SEE [id number] 5.9 6.4  NEUTROABS  --  5.1  --   --   HGB 13.7 DUPLICATE SEE F63540 11.5* 11.4*  HCT 41.4 DUPLICATE SEE F63540 36.1* 35.5*  MCV 92.0 DUPLICATE SEE F63540 93.5 93.4  PLT 79* DUPLICATE SEE [id number] 76* 74*   Basic Metabolic Panel:  Recent Labs Lab 09/25/16 0944 09/26/16 0733 09/27/16 0501  NA 139 137 136  K 4.3 3.8 4.1  CL 106 104 105  CO2 28 28 28   GLUCOSE 120* 142* 125*  BUN 6 6 12   CREATININE 0.89 0.84 0.84  CALCIUM 8.6* 7.7* 7.7*   GFR: Estimated Creatinine Clearance: 115.8 mL/min (by C-G formula based on SCr of 0.84 mg/dL). Liver Function Tests:  Recent Labs Lab 09/25/16 0944 09/26/16 0733  AST 38 29  ALT 26 22  ALKPHOS 135* 109  BILITOT 0.4 0.7  PROT 7.2 6.4*  ALBUMIN 2.5* 2.0*    Recent Labs Lab 09/25/16 0944  LIPASE 27   No results for input(s): AMMONIA in the last 168 hours. Coagulation Profile:  Recent Labs Lab 09/25/16 1006  INR 1.28   Cardiac Enzymes: No results for input(s): CKTOTAL, CKMB, CKMBINDEX, TROPONINI in the last 168 hours. BNP (last 3 results) No results for input(s): PROBNP in the last 8760 hours. HbA1C: No results for input(s): HGBA1C in the last 72 hours. CBG: No results for  input(s): GLUCAP in the last 168 hours. Lipid Profile: No results for input(s): CHOL, HDL, LDLCALC, TRIG, CHOLHDL, LDLDIRECT in the last 72 hours. Thyroid Function Tests: No results for input(s): TSH, T4TOTAL, FREET4, T3FREE, THYROIDAB in the last 72 hours. Anemia Panel: No results for input(s): VITAMINB12, FOLATE, FERRITIN, TIBC, IRON, RETICCTPCT in the last 72 hours. Sepsis Labs:  Recent Labs Lab 09/25/16 1017  LATICACIDVEN 1.65    Recent Results (from the past 240 hour(s))  Blood culture (routine x 2)     Status: None (Preliminary result)   Collection Time: 09/25/16 12:20 PM  Result Value Ref Range Status   Specimen Description BLOOD LEFT ANTECUBITAL  Final   Special Requests  BOTTLES DRAWN AEROBIC ONLY 6CC  Final   Culture NO GROWTH 1 DAY  Final   Report Status PENDING  Incomplete  Blood culture (routine x 2)     Status: None (Preliminary result)   Collection Time: 09/25/16 12:25 PM  Result Value Ref Range Status   Specimen Description BLOOD LEFT HAND  Final   Special Requests IN PEDIATRIC BOTTLE 4CC  Final   Culture NO GROWTH 1 DAY  Final   Report Status PENDING  Incomplete  Culture, body fluid-bottle     Status: None (Preliminary result)   Collection Time: 09/25/16  3:05 PM  Result Value Ref Range Status   Specimen Description PERITONEAL  Final   Special Requests NONE  Final   Culture NO GROWTH 1 DAY  Final   Report Status PENDING  Incomplete  Gram stain     Status: None   Collection Time: 09/25/16  3:05 PM  Result Value Ref Range Status   Specimen Description PERITONEAL  Final   Special Requests NONE  Final   Gram Stain   Final    FEW WBC PRESENT,BOTH PMN AND MONONUCLEAR NO ORGANISMS SEEN    Report Status 09/26/2016 FINAL  Final         Radiology Studies: US Paracentesis  Result Date: 09/27/2016 INDICATION: Ascites secondary to alcoholic cirrhosis. Chronic hepatitis B and C. Request for therapeutic paracentesis. EXAM: ULTRASOUND GUIDED RIGHT LATERAL  ABDOMEN PARACENTESIS MEDICATIONS: 1% Lidocaine. COMPLICATIONS: None immediate. PROCEDURE: Informed written consent was obtained from the patient after a discussion of the risks, benefits and alternatives to treatment. A timeout was performed prior to the initiation of the procedure. Initial ultrasound scanning demonstrates a large amount of ascites within the right lateral abdomen. The right lateral abdomen was prepped and draped in the usual sterile fashion. 1% lidocaine with epinephrine was used for local anesthesia. Following this, a 19 gauge, 7-cm, Yueh catheter was introduced. An ultrasound image was saved for documentation purposes. The paracentesis was performed. The catheter was removed and a dressing was applied. The patient tolerated the procedure well without immediate post procedural complication. FINDINGS: A total of approximately 5 liters of clear yellow fluid was removed. IMPRESSION: Successful ultrasound-guided paracentesis yielding 5 liters of peritoneal fluid. Read by:  Corrin Parker, PA-C Electronically Signed   By: Gilmer Mor D.O.   On: 09/27/2016 10:04        Scheduled Meds: . lidocaine (PF)      . albumin human  12.5 g Intravenous Once  . cefTRIAXone (ROCEPHIN)  IV  2 g Intravenous Q24H  . Influenza vac split quadrivalent PF  0.5 mL Intramuscular Tomorrow-1000  . lactulose  20 g Oral Daily  . pantoprazole  40 mg Oral BID  . sodium chloride flush  3 mL Intravenous Q12H  . spironolactone  25 mg Oral BID  . sucralfate  1 g Oral TID AC & HS   Continuous Infusions:    LOS: 2 days    Time spent: 35 minutes.     Alba Cory, MD [facility] Pager [phone]  If 7PM-7AM, please contact night-coverage [email or web address] Password [email or web address] 09/27/2016, 2:26 PM

## 2016-09-27 NOTE — Progress Notes (Signed)
Pt States that he feels like he is bloated and fluid is back in stomach. Abdomen feels thight according to him. Call to Dr. Sunnie Nielsenegalado. Called back with new Orders and will monitor.

## 2016-09-28 LAB — CBC
HEMATOCRIT: 33.8 % — AB (ref 39.0–52.0)
HEMOGLOBIN: 11 g/dL — AB (ref 13.0–17.0)
MCH: 30.1 pg (ref 26.0–34.0)
MCHC: 32.5 g/dL (ref 30.0–36.0)
MCV: 92.3 fL (ref 78.0–100.0)
Platelets: 68 10*3/uL — ABNORMAL LOW (ref 150–400)
RBC: 3.66 MIL/uL — ABNORMAL LOW (ref 4.22–5.81)
RDW: 14.5 % (ref 11.5–15.5)
WBC: 5.1 10*3/uL (ref 4.0–10.5)

## 2016-09-28 LAB — BASIC METABOLIC PANEL
ANION GAP: 5 (ref 5–15)
BUN: 10 mg/dL (ref 6–20)
CALCIUM: 7.8 mg/dL — AB (ref 8.9–10.3)
CHLORIDE: 104 mmol/L (ref 101–111)
CO2: 28 mmol/L (ref 22–32)
CREATININE: 0.81 mg/dL (ref 0.61–1.24)
GFR calc non Af Amer: >60 mL/min (ref >60)
GLUCOSE: 103 mg/dL — AB (ref 65–99)
Potassium: 4.2 mmol/L (ref 3.5–5.1)
Sodium: 137 mmol/L (ref 135–145)

## 2016-09-28 LAB — HEPATITIS B E ANTIBODY: HEP B E AB: NEGATIVE

## 2016-09-28 MED ORDER — FUROSEMIDE 20 MG PO TABS
20.0000 mg | ORAL_TABLET | Freq: Every day | ORAL | Status: DC
  Administered 2016-09-28: 20 mg via ORAL
  Filled 2016-09-28: qty 1

## 2016-09-28 MED ORDER — SPIRONOLACTONE 50 MG PO TABS
50.0000 mg | ORAL_TABLET | Freq: Two times a day (BID) | ORAL | Status: DC
  Administered 2016-09-28 – 2016-10-01 (×6): 50 mg via ORAL
  Filled 2016-09-28 (×6): qty 1

## 2016-09-28 MED ORDER — FUROSEMIDE 20 MG PO TABS
20.0000 mg | ORAL_TABLET | Freq: Two times a day (BID) | ORAL | Status: DC
  Administered 2016-09-29 – 2016-10-01 (×5): 20 mg via ORAL
  Filled 2016-09-28 (×5): qty 1

## 2016-09-28 NOTE — Progress Notes (Signed)
[facility] Progress Note  Subjective: feels a little better after paracentesis but feels much of the ascites has reaccumulated.   Objective:  Vital signs in last 24 hours: Temp:  [97.8 F (36.6 C)-98.7 F (37.1 C)] 98.4 F (36.9 C) (10/16 1345) Pulse Rate:  [70-86] 70 (10/16 1345) Resp:  [17-18] 18 (10/16 0647) BP: (120-135)/(74-94) 120/94 (10/16 1345) SpO2:  [95 %-97 %] 97 % (10/16 1345) Last BM Date: 09/27/16 General:   Alert, white male in NAD Heart:  Regular rate and rhythm; no murmurs Pulm; A few crackles a both bases.  Abdomen:  Soft, moderately distended, mild diffuse tenderness.  Normal bowel sounds, midline hernia Extremities: Minimal edema Neurologic:  Alert and  oriented x4;  grossly normal neurologically. Psych:  Alert and cooperative. Normal mood and affect.  Intake/Output from previous day: 10/15 0701 - 10/16 0700 In: 1590 [P.O.:640; I.V.:900; IV Piggyback:50] Out: -  Intake/Output this shift: Total I/O In: 50 [IV Piggyback:50] Out: -   Lab Results:  Recent Labs  09/26/16 0733 09/27/16 0501 09/28/16 0415  WBC 5.9 6.4 5.1  HGB 11.5* 11.4* 11.0*  HCT 36.1* 35.5* 33.8*  PLT 76* 74* 68*   BMET  Recent Labs  09/26/16 0733 09/27/16 0501 09/28/16 0415  NA 137 136 137  K 3.8 4.1 4.2  CL 104 105 104  CO2 28 28 28   GLUCOSE 142* 125* 103*  BUN 6 12 10   CREATININE 0.84 0.84 0.81  CALCIUM 7.7* 7.7* 7.8*   LFT  Recent Labs  09/26/16 0733  PROT 6.4*  ALBUMIN 2.0*  AST 29  ALT 22  ALKPHOS 109  BILITOT 0.7  US Paracentesis  Result Date: 09/27/2016 INDICATION: Ascites secondary to alcoholic cirrhosis. Chronic hepatitis B and C. Request for therapeutic paracentesis. EXAM: ULTRASOUND GUIDED RIGHT LATERAL ABDOMEN PARACENTESIS MEDICATIONS: 1% Lidocaine. COMPLICATIONS: None immediate. PROCEDURE: Informed written consent was obtained from the patient after a discussion of the risks, benefits and alternatives to treatment. A  timeout was performed prior to the initiation of the procedure. Initial ultrasound scanning demonstrates a large amount of ascites within the right lateral abdomen. The right lateral abdomen was prepped and draped in the usual sterile fashion. 1% lidocaine with epinephrine was used for local anesthesia. Following this, a 19 gauge, 7-cm, Yueh catheter was introduced. An ultrasound image was saved for documentation purposes. The paracentesis was performed. The catheter was removed and a dressing was applied. The patient tolerated the procedure well without immediate post procedural complication. FINDINGS: A total of approximately 5 liters of clear yellow fluid was removed. IMPRESSION: Successful ultrasound-guided paracentesis yielding 5 liters of peritoneal fluid. Read by:  Corrin Parker, PA-C Electronically Signed   By: Gilmer Mor D.O.   On: 09/27/2016 10:04   Dg Abd Portable 1v  Result Date: 09/27/2016 CLINICAL DATA:  Abdominal pain.  Paracentesis today EXAM: PORTABLE ABDOMEN - 1 VIEW COMPARISON:  None. FINDINGS: The bowel gas pattern is normal. No radio-opaque calculi or other significant radiographic abnormality are seen. IMPRESSION: Negative. These results will be called to the ordering clinician or representative by the Radiologist Assistant, and communication documented in the PACS or zVision Dashboard. Electronically Signed   By: Marlan Palau M.D.   On: 09/27/2016 18:55    Assessment / Plan:  1. Decompensated cirrhosis (MELD 9) with large amount of massive ascites, s/p removal of 5 liters yesterday. Normal AFP and no focal liver lesions on CTscan in August. Denies hx of heavy ETOH, patient just found out  a few weeks ago that he had viral hepatitis and cirrhosis. Patient feels ascites reaccumulating, his belly is moderately distended but soft. He may need another LVP prior to discharge but may also do okay with diuretics and low sodium diet. No urine output documented but states he is urinating a  lot and lower extremity edema has all but resolved over weekend.      -On heart healthy diet, will make sure this includes only 2 grams of sodium -monitor I+O on diuretics  -Increase diuetics slowly base on daily weights, renal function, and exam. On very low dose of aldactone and lasix,  both started two days ago but clinically having good response. - Only a few WBCs on gram stain and no cell count done on ascitic fluid.  LD only minimally elevated, patient is afebrile . His pain is probably secondary to distention rather than SBP (listed on problem list). No need to go home on antibiotics. Currently geting Rocephin -continue Chronulac. Patient stopped it at home after reading it caused bloating - thought that was why he was distended.  -should get HAV vaccination at some point.   2. PUD, Giant ulcer August 2017 after presenting with low volume hematemesis. He didn't follow up with us for repeat EGD to document healing. No further vomiting / hematemesis. No black stools. Hgb stable. Continue BID ppi. Follow up outpatient.   3. Hx of polysubstance abuse.  4. HBV / HCV , untreated. Duration of coinfection unknown. Will need eventual ID evaluation    LOS: 3 days   Dan [name]  09/28/2016, 4:45 PM  Pager number [phone]

## 2016-09-28 NOTE — Progress Notes (Signed)
PROGRESS NOTE    Dan VERA  [name]:[id number] DOB: [birth year] DOA: 09/25/2016 PCP: No PCP Per Patient  Brief Narrative: Dan [name] is a 45 y.o. 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 He reports chronic protuberant abdomen that has enlarged over the past 24 hours. This has been accompanied by aching pain throughout the abdomen, nonradiating, severe, constant, associated with nausea and vomiting x2 last night with red blood in it. He has taken no medications, including NSAIDs. Denies alcohol use. He was admitted in August for hematemesis found to have cirrhosis and giant gastric ulcer on EGD. He was to take PPI and carafate and follow up at [facility] and  GI for repeat EGD, and to establish care at the [facility] but never did. On arrival to the ED he was in no distress, afebrile with normal vital signs. A distended, protuberant abdomen with fluid wave was noted and diagnostic paracentesis was performed in the ED. Antibiotics were started and [facility] called to admit. Complete metabolic panel largely unremarkable, albumin 2.5. CBC abnormal with platelets 79. Lactate normal, UA uninfected, CXR neg, UDS + benzos. Ascitic fluid analysis pending.   Review of Systems: Denies fever, chills, weight loss, changes in vision or hearing, headache, cough, sore throat, chest pain, palpitations, shortness of breath, changes in bowel habits, melena, blood in stool, change in bladder habits, myalgias, arthralgias, and rash. And per HPI. All others reviewed and are negative.     Assessment & Plan:   Active Problems:   Cirrhosis (HCC)   Hematemesis   Chronic giant gastric ulcer   Hepatitis B infection without delta agent without hepatic coma   Chronic hepatitis C without hepatic coma (HCC)   SBP (spontaneous bacterial peritonitis) (HCC)   Spontaneous bacterial peritonitis (HCC)  Acute abdominal pain in chronic cirrhotic patient.   Ascites: - Paracentesis performed in ED, no significant WBC, follow culture ascites  fluid.  - Follow up  LD, glucose, gram stain negative, culture no growth to date.  - Continue with ceftriaxone.  -underwent paracentesis, only 5 L removed 10-15.  Albumin ordered after paracentesis.  -continue with spironolactone. Add low dose lasix.  -Might need repeat paracentesis.  -continue with lactulose.   Hematemesis without anemia: With history of EGD Aug 2017 showing very large gastric ulcer by Dr. Adela Lank. Plan was for repeat EGD in 6 weeks. Nonadherent to PPI and carafate.  - Clear liquids for now - hb at 11, follow trend. Denies further hematemesis.  -Protonix BID.  -hb stable.    Chronic hepatitis B and C: Hep C genotype 1a, quant 53k. + Hep B titers in Aug 2017.  - Recommend outpatient follow up at [facility] to consider treatment.     DVT prophylaxis: scd. Code Status: Full code.  Family Communication: care discussed with patient  Disposition Plan: remain inpatient.    Consultants:   GI, Dr Leone Payor.    Procedures:   Paracentesis    Antimicrobials:   ceftriaxone 10-13   Subjective: Still with abdominal pain, feels abdomen is getting distended again.  Had BM    Objective: Vitals:   09/27/16 1833 09/27/16 2013 09/28/16 0647 09/28/16 1345  BP: 124/75 135/74 127/79 (!) 120/94  Pulse: 77 86 74 70  Resp:  17 18   Temp: 98.7 F (37.1 C) 97.8 F (36.6 C) 98 F (36.7 C) 98.4 F (36.9 C)  TempSrc: Oral Oral Oral Oral  SpO2: 97%  96% 95% 97%  Weight:      Height:        Intake/Output Summary (Last 24 hours) at 09/28/16 1607 Last data filed at 09/28/16 1500  Gross per 24 hour  Intake              460 ml  Output                0 ml  Net              460 ml   Filed Weights   09/25/16 0933 09/25/16 2115  Weight: 68 kg (150 lb) 79.2 kg (174 lb 9.6 oz)    Examination:  General exam: Appears calm and comfortable  Respiratory system: Clear to auscultation.  Respiratory effort normal. Cardiovascular system: S1 & S2 heard, RRR. No JVD, murmurs, rubs, gallops or clicks. No pedal edema. Gastrointestinal system: Abdomen is nondistended, soft and nontender. No organomegaly or masses felt. Normal bowel sounds heard. Central nervous system: Alert and oriented. No focal neurological deficits. Extremities: Symmetric 5 x 5 power. Skin: No rashes, lesions or ulcers Psychiatry: Judgement and insight appear normal. Mood & affect appropriate.     Data Reviewed: I have personally reviewed following labs and imaging studies  CBC:  Recent Labs Lab 09/25/16 0944 09/25/16 1006 09/26/16 0733 09/27/16 0501 09/28/16 0415  WBC 6.7 DUPLICATE SEE F63540 5.9 6.4 5.1  NEUTROABS  --  5.1  --   --   --   HGB 13.7 DUPLICATE SEE F63540 11.5* 11.4* 11.0*  HCT 41.4 DUPLICATE SEE F63540 36.1* 35.5* 33.8*  MCV 92.0 DUPLICATE SEE F63540 93.5 93.4 92.3  PLT 79* DUPLICATE SEE F63540 76* 74* 68*   Basic Metabolic Panel:  Recent Labs Lab 09/25/16 0944 09/26/16 0733 09/27/16 0501 09/28/16 0415  NA 139 137 136 137  K 4.3 3.8 4.1 4.2  CL 106 104 105 104  CO2 28 28 28 28   GLUCOSE 120* 142* 125* 103*  BUN 6 6 12 10   CREATININE 0.89 0.84 0.84 0.81  CALCIUM 8.6* 7.7* 7.7* 7.8*   GFR: Estimated Creatinine Clearance: 120.1 mL/min (by C-G formula based on SCr of 0.81 mg/dL). Liver Function Tests:  Recent Labs Lab 09/25/16 0944 09/26/16 0733  AST 38 29  ALT 26 22  ALKPHOS 135* 109  BILITOT 0.4 0.7  PROT 7.2 6.4*  ALBUMIN 2.5* 2.0*    Recent Labs Lab 09/25/16 0944  LIPASE 27   No results for input(s): AMMONIA in the last 168 hours. Coagulation Profile:  Recent Labs Lab 09/25/16 1006  INR 1.28   Cardiac Enzymes: No results for input(s): CKTOTAL, CKMB, CKMBINDEX, TROPONINI in the last 168 hours. BNP (last 3 results) No results for input(s): PROBNP in the last 8760 hours. HbA1C: No results for input(s): HGBA1C in the last 72 hours. CBG: No  results for input(s): GLUCAP in the last 168 hours. Lipid Profile: No results for input(s): CHOL, HDL, LDLCALC, TRIG, CHOLHDL, LDLDIRECT in the last 72 hours. Thyroid Function Tests: No results for input(s): TSH, T4TOTAL, FREET4, T3FREE, THYROIDAB in the last 72 hours. Anemia Panel: No results for input(s): VITAMINB12, FOLATE, FERRITIN, TIBC, IRON, RETICCTPCT in the last 72 hours. Sepsis Labs:  Recent Labs Lab 09/25/16 1017  LATICACIDVEN 1.65    Recent Results (from the past 240 hour(s))  Blood culture (routine x 2)     Status: None (Preliminary result)   Collection Time: 09/25/16 12:20 PM  Result Value Ref Range Status   Specimen Description BLOOD LEFT  ANTECUBITAL  Final   Special Requests BOTTLES DRAWN AEROBIC ONLY 6CC  Final   Culture NO GROWTH 3 DAYS  Final   Report Status PENDING  Incomplete  Blood culture (routine x 2)     Status: None (Preliminary result)   Collection Time: 09/25/16 12:25 PM  Result Value Ref Range Status   Specimen Description BLOOD LEFT HAND  Final   Special Requests IN PEDIATRIC BOTTLE 4CC  Final   Culture NO GROWTH 3 DAYS  Final   Report Status PENDING  Incomplete  Culture, body fluid-bottle     Status: None (Preliminary result)   Collection Time: 09/25/16  3:05 PM  Result Value Ref Range Status   Specimen Description PERITONEAL  Final   Special Requests NONE  Final   Culture NO GROWTH 3 DAYS  Final   Report Status PENDING  Incomplete  Gram stain     Status: None   Collection Time: 09/25/16  3:05 PM  Result Value Ref Range Status   Specimen Description PERITONEAL  Final   Special Requests NONE  Final   Gram Stain   Final    FEW WBC PRESENT,BOTH PMN AND MONONUCLEAR NO ORGANISMS SEEN    Report Status 09/26/2016 FINAL  Final         Radiology Studies: US Paracentesis  Result Date: 09/27/2016 INDICATION: Ascites secondary to alcoholic cirrhosis. Chronic hepatitis B and C. Request for therapeutic paracentesis. EXAM: ULTRASOUND GUIDED  RIGHT LATERAL ABDOMEN PARACENTESIS MEDICATIONS: 1% Lidocaine. COMPLICATIONS: None immediate. PROCEDURE: Informed written consent was obtained from the patient after a discussion of the risks, benefits and alternatives to treatment. A timeout was performed prior to the initiation of the procedure. Initial ultrasound scanning demonstrates a large amount of ascites within the right lateral abdomen. The right lateral abdomen was prepped and draped in the usual sterile fashion. 1% lidocaine with epinephrine was used for local anesthesia. Following this, a 19 gauge, 7-cm, Yueh catheter was introduced. An ultrasound image was saved for documentation purposes. The paracentesis was performed. The catheter was removed and a dressing was applied. The patient tolerated the procedure well without immediate post procedural complication. FINDINGS: A total of approximately 5 liters of clear yellow fluid was removed. IMPRESSION: Successful ultrasound-guided paracentesis yielding 5 liters of peritoneal fluid. Read by:  Corrin Parker, PA-C Electronically Signed   By: Gilmer Mor D.O.   On: 09/27/2016 10:04   Dg Abd Portable 1v  Result Date: 09/27/2016 CLINICAL DATA:  Abdominal pain.  Paracentesis today EXAM: PORTABLE ABDOMEN - 1 VIEW COMPARISON:  None. FINDINGS: The bowel gas pattern is normal. No radio-opaque calculi or other significant radiographic abnormality are seen. IMPRESSION: Negative. These results will be called to the ordering clinician or representative by the Radiologist Assistant, and communication documented in the PACS or zVision Dashboard. Electronically Signed   By: Marlan Palau M.D.   On: 09/27/2016 18:55        Scheduled Meds: . cefTRIAXone (ROCEPHIN)  IV  2 g Intravenous Q24H  . furosemide  20 mg Oral Daily  . Influenza vac split quadrivalent PF  0.5 mL Intramuscular Tomorrow-1000  . lactulose  20 g Oral Daily  . pantoprazole  40 mg Oral BID  . sodium chloride flush  3 mL Intravenous Q12H  .  spironolactone  25 mg Oral BID  . sucralfate  1 g Oral TID AC & HS   Continuous Infusions:    LOS: 3 days    Time spent: 35 minutes.  Alba Coryegalado, Nykeria Mealing A, MD [facility] Pager [phone]  If 7PM-7AM, please contact night-coverage [email or web address] Password [email or web address] 09/28/2016, 4:07 PM

## 2016-09-29 LAB — HEPATITIS B SURFACE AG, CONFIRM: HBSAG CONFIRMATION: POSITIVE — AB

## 2016-09-29 LAB — BASIC METABOLIC PANEL
ANION GAP: 5 (ref 5–15)
BUN: 13 mg/dL (ref 6–20)
CALCIUM: 7.9 mg/dL — AB (ref 8.9–10.3)
CO2: 30 mmol/L (ref 22–32)
Chloride: 101 mmol/L (ref 101–111)
Creatinine, Ser: 0.74 mg/dL (ref 0.61–1.24)
GFR calc Af Amer: >60 mL/min (ref >60)
Glucose, Bld: 121 mg/dL — ABNORMAL HIGH (ref 65–99)
POTASSIUM: 4.4 mmol/L (ref 3.5–5.1)
SODIUM: 136 mmol/L (ref 135–145)

## 2016-09-29 LAB — HEPATITIS B SURFACE ANTIGEN

## 2016-09-29 MED ORDER — HYDROMORPHONE HCL 2 MG/ML IJ SOLN
1.0000 mg | INTRAMUSCULAR | Status: DC | PRN
  Administered 2016-09-29 – 2016-10-01 (×13): 1 mg via INTRAVENOUS
  Filled 2016-09-29 (×13): qty 1

## 2016-09-29 NOTE — Progress Notes (Signed)
     [facility] Progress Note  Subjective: Had some sharp left sided abdominal pain last night.   Objective:  Vital signs in last 24 hours: Temp:  [98.4 F (36.9 C)] 98.4 F (36.9 C) (10/17 0527) Pulse Rate:  [69-80] 69 (10/17 0527) Resp:  [18-20] 20 (10/17 0527) BP: (114-121)/(60-94) 114/70 (10/17 0527) SpO2:  [96 %-98 %] 96 % (10/17 0527) Weight:  [171 lb 1.6 oz (77.6 kg)] 171 lb 1.6 oz (77.6 kg) (10/16 2106) Last BM Date: 09/28/16 General:   Thin pleasant white male  in NAD Heart:  Regular rate and rhythm; no murmurs Pulm: Decreased breath sounds at bases, a few LLL crackles Abdomen:  Soft, moderately distended. Mild abdominal tenderness (right and left lateral) . Normal bowel sounds Extremities:  Without edema. Neurologic:  Alert and  oriented x4;  grossly normal neurologically. Psych:  Alert and cooperative. Normal mood and affect.  Lab Results:  Recent Labs  09/27/16 0501 09/28/16 0415  WBC 6.4 5.1  HGB 11.4* 11.0*  HCT 35.5* 33.8*  PLT 74* 68*   BMET  Recent Labs  09/27/16 0501 09/28/16 0415 09/29/16 0523  NA 136 137 136  K 4.1 4.2 4.4  CL 105 104 101  CO2 28 28 30   GLUCOSE 125* 103* 121*  BUN 12 10 13   CREATININE 0.84 0.81 0.74  CALCIUM 7.7* 7.8* 7.9*    Assessment / Plan:  1. Decompensated cirrhosis (MELD 9) with large amount of ascites, s/p removal of 5 liters 2 days ago. Normal AFP.No focal liver lesions on CTscan in August.  His belly is moderately distended, about same as yesterday but edema in legs has resolved.  -Inadequate fluid studies done to evaluate for SBP but doubtful based on exam, WBC, fluid culture and absence of temp. He did develop sharp left sided abdominal pain during the night however. Will continue empirical Rocephin through today.  -Repeat therapeutic paracentesis. He is still uncomfortable with amount of distention. Will request removal of 3-4 liters for comfort. Hopefully patient can go home tomorrow. Will  repeat cell count.   -Diuretics increased today . Currently has + 2550ml  fluid balance but inaccurate as urine output wasn't being documented until yesterday evening.  -Continue 2 grams of sodium diet -Monitor I+O on diuretics,  weights  -Continue Chronulac. Patient stopped it at home after reading it caused bloating - thought that was why he was distended.  -Should get HAV vaccination at some point.   2. PUD, Giant ulcer August 2017 after presenting with low volume hematemesis. He didn't follow up with us for repeat EGD to document healing. No further vomiting / hematemesis. No black stools. Hgb stable. Continue BID ppi. Follow up outpatient.   3. Hx of polysubstance abuse.  4. HBV / HCV , untreated. Duration of coinfection unknown. Will need eventual ID evaluation  End point for discharge home? We could send for therapeutic paracentesis in am , continue diuresis at home and follow up outpatient.     LOS: 4 days   Willette Clusteraula Jakim Drapeau  09/29/2016, 1:18 PM  Pager number [phone]

## 2016-09-29 NOTE — Progress Notes (Signed)
PROGRESS NOTE    Dan [name]  WJX:[id number] DOB: [birth year] DOA: 09/25/2016 PCP: No PCP Per Patient  Brief Narrative: Dan [name] is a 45 y.o. 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 He reports chronic protuberant abdomen that has enlarged over the past 24 hours. This has been accompanied by aching pain throughout the abdomen, nonradiating, severe, constant, associated with nausea and vomiting x2 last night with red blood in it. He has taken no medications, including NSAIDs. Denies alcohol use. He was admitted in August for hematemesis found to have cirrhosis and giant gastric ulcer on EGD. He was to take PPI and carafate and [facility] for repeat [facility], and [facility] but never did. On arrival to the ED he was in no distress, afebrile with normal vital signs. A distended, protuberant abdomen with fluid wave was noted and diagnostic paracentesis was performed in the ED. Antibiotics were started and TR[facility] called to admit. Complete metabolic panel largely unremarkable, albumin 2.5. CBC abnormal with platelets 79. Lactate normal, UA uninfected, CXR neg, UDS + benzos. Ascitic fluid analysis pending.   Review of Systems: Denies fever, chills, weight loss, changes in vision or hearing, headache, cough, sore throat, chest pain, palpitations, shortness of breath, changes in bowel habits, melena, blood in stool, change in bladder habits, myalgias, arthralgias, and rash. And per HPI. All others reviewed and are negative.     Assessment & Plan:   Active Problems:   Other cirrhosis of liver (HCC)   Hematemesis   Chronic giant gastric ulcer   Hepatitis B infection without delta agent without hepatic coma   Chronic hepatitis C without hepatic coma (HCC)   SBP (spontaneous bacterial peritonitis) (HCC)   Spontaneous bacterial peritonitis (HCC)  Acute abdominal pain in chronic  cirrhotic patient.  Ascites: - Paracentesis performed in ED, no significant WBC, follow culture ascites  fluid.  - Follow up  LD, glucose, gram stain negative, culture no growth to date.  - Continue with ceftriaxone.  -underwent paracentesis, only 5 L removed 10-15.  Albumin ordered after paracentesis.  -continue with spironolactone, lasix.  -continue with lactulose.   Hematemesis without anemia: With history of EGD Aug 2017 showing very large gastric ulcer by Dr. Adela LankArmbruster. Plan was for repeat EGD in 6 weeks. Nonadherent to PPI and carafate.  - Clear liquids for now - hb at 11, follow trend. Denies further hematemesis.  -Protonix BID.  -hb stable.    Chronic hepatitis B and C: Hep C genotype 1a, quant 53k. + Hep B titers in Aug 2017.  - [facility]     DVT prophylaxis: scd. Code Status: Full code.  Family Communication: care discussed with patient  Disposition Plan: remain inpatient. Pt evaluation    Consultants:   GI, Dr Leone PayorGessner.    Procedures:   Paracentesis    Antimicrobials:   ceftriaxone 10-13   Subjective: Report mild abdominal pain     Objective: Vitals:   09/28/16 0647 09/28/16 1345 09/28/16 2106 09/29/16 0527  BP: 127/79 (!) 120/94 121/60 114/70  Pulse: 74 70 80 69  Resp: 18  18 20   Temp: 98 F (36.7 C) 98.4 F (36.9 C) 98.4 F (36.9 C) 98.4 F (36.9 C)  TempSrc: Oral Oral Oral Oral  SpO2: 95% 97% 98% 96%  Weight:   77.6 kg (171 lb  1.6 oz)   Height:        Intake/Output Summary (Last 24 hours) at 09/29/16 1338 Last data filed at 09/29/16 1316  Gross per 24 hour  Intake              700 ml  Output                0 ml  Net              700 ml   Filed Weights   09/25/16 0933 09/25/16 2115 09/28/16 2106  Weight: 68 kg (150 lb) 79.2 kg (174 lb 9.6 oz) 77.6 kg (171 lb 1.6 oz)    Examination:  General exam: Appears calm and comfortable  Respiratory system: Clear to auscultation.  Respiratory effort normal. Cardiovascular system: S1 & S2 heard, RRR. No JVD, murmurs, rubs, gallops or clicks. No pedal edema. Gastrointestinal system: Abdomen is nondistended, soft and nontender. No organomegaly or masses felt. Normal bowel sounds heard. Central nervous system: Alert and oriented. No focal neurological deficits. Extremities: Symmetric 5 x 5 power. Skin: No rashes, lesions or ulcers Psychiatry: Judgement and insight appear normal. Mood & affect appropriate.     Data Reviewed: I have personally reviewed following labs and imaging studies  CBC:  Recent Labs Lab 09/25/16 0944 09/25/16 1006 09/26/16 0733 09/27/16 0501 09/28/16 0415  WBC 6.7 DUPLICATE SEE F63540 5.9 6.4 5.1  NEUTROABS  --  5.1  --   --   --   HGB 13.7 DUPLICATE SEE F63540 11.5* 11.4* 11.0*  HCT 41.4 DUPLICATE SEE F63540 36.1* 35.5* 33.8*  MCV 92.0 DUPLICATE SEE F63540 93.5 93.4 92.3  PLT 79* DUPLICATE SEE F63540 76* 74* 68*   Basic Metabolic Panel:  Recent Labs Lab 09/25/16 0944 09/26/16 0733 09/27/16 0501 09/28/16 0415 09/29/16 0523  NA 139 137 136 137 136  K 4.3 3.8 4.1 4.2 4.4  CL 106 104 105 104 101  CO2 28 28 28 28 30   GLUCOSE 120* 142* 125* 103* 121*  BUN 6 6 12 10 13   CREATININE 0.89 0.84 0.84 0.81 0.74  CALCIUM 8.6* 7.7* 7.7* 7.8* 7.9*   GFR: Estimated Creatinine Clearance: 121.6 mL/min (by C-G formula based on SCr of 0.74 mg/dL). Liver Function Tests:  Recent Labs Lab 09/25/16 0944 09/26/16 0733  AST 38 29  ALT 26 22  ALKPHOS 135* 109  BILITOT 0.4 0.7  PROT 7.2 6.4*  ALBUMIN 2.5* 2.0*    Recent Labs Lab 09/25/16 0944  LIPASE 27   No results for input(s): AMMONIA in the last 168 hours. Coagulation Profile:  Recent Labs Lab 09/25/16 1006  INR 1.28   Cardiac Enzymes: No results for input(s): CKTOTAL, CKMB, CKMBINDEX, TROPONINI in the last 168 hours. BNP (last 3 results) No results for input(s): PROBNP in the last 8760 hours. HbA1C: No results for  input(s): HGBA1C in the last 72 hours. CBG: No results for input(s): GLUCAP in the last 168 hours. Lipid Profile: No results for input(s): CHOL, HDL, LDLCALC, TRIG, CHOLHDL, LDLDIRECT in the last 72 hours. Thyroid Function Tests: No results for input(s): TSH, T4TOTAL, FREET4, T3FREE, THYROIDAB in the last 72 hours. Anemia Panel: No results for input(s): VITAMINB12, FOLATE, FERRITIN, TIBC, IRON, RETICCTPCT in the last 72 hours. Sepsis Labs:  Recent Labs Lab 09/25/16 1017  LATICACIDVEN 1.65    Recent Results (from the past 240 hour(s))  Blood culture (routine x 2)     Status: None (Preliminary result)   Collection Time: 09/25/16 12:20 PM  Result Value Ref Range Status   Specimen Description BLOOD LEFT ANTECUBITAL  Final   Special Requests BOTTLES DRAWN AEROBIC ONLY 6CC  Final   Culture NO GROWTH 3 DAYS  Final   Report Status PENDING  Incomplete  Blood culture (routine x 2)     Status: None (Preliminary result)   Collection Time: 09/25/16 12:25 PM  Result Value Ref Range Status   Specimen Description BLOOD LEFT HAND  Final   Special Requests IN PEDIATRIC BOTTLE 4CC  Final   Culture NO GROWTH 3 DAYS  Final   Report Status PENDING  Incomplete  Culture, body fluid-bottle     Status: None (Preliminary result)   Collection Time: 09/25/16  3:05 PM  Result Value Ref Range Status   Specimen Description PERITONEAL  Final   Special Requests NONE  Final   Culture NO GROWTH 3 DAYS  Final   Report Status PENDING  Incomplete  Gram stain     Status: None   Collection Time: 09/25/16  3:05 PM  Result Value Ref Range Status   Specimen Description PERITONEAL  Final   Special Requests NONE  Final   Gram Stain   Final    FEW WBC PRESENT,BOTH PMN AND MONONUCLEAR NO ORGANISMS SEEN    Report Status 09/26/2016 FINAL  Final         Radiology Studies: Dg Abd Portable 1v  Result Date: 09/27/2016 CLINICAL DATA:  Abdominal pain.  Paracentesis today EXAM: PORTABLE ABDOMEN - 1 VIEW  COMPARISON:  None. FINDINGS: The bowel gas pattern is normal. No radio-opaque calculi or other significant radiographic abnormality are seen. IMPRESSION: Negative. These results will be called to the ordering clinician or representative by the Radiologist Assistant, and communication documented in the PACS or zVision Dashboard. Electronically Signed   By: Marlan Palau M.D.   On: 09/27/2016 18:55        Scheduled Meds: . cefTRIAXone (ROCEPHIN)  IV  2 g Intravenous Q24H  . furosemide  20 mg Oral BID  . Influenza vac split quadrivalent PF  0.5 mL Intramuscular Tomorrow-1000  . lactulose  20 g Oral Daily  . pantoprazole  40 mg Oral BID  . sodium chloride flush  3 mL Intravenous Q12H  . spironolactone  50 mg Oral BID  . sucralfate  1 g Oral TID AC & HS   Continuous Infusions:    LOS: 4 days    Time spent: 35 minutes.     Alba Cory, MD [facility] Pager [phone]  If 7PM-7AM, please contact night-coverage [email or web address] Password [email or web address] 09/29/2016, 1:38 PM

## 2016-09-30 ENCOUNTER — Inpatient Hospital Stay (HOSPITAL_COMMUNITY): Payer: Medicaid Other

## 2016-09-30 LAB — BODY FLUID CELL COUNT WITH DIFFERENTIAL
EOS FL: 0 %
LYMPHS FL: 44 %
MONOCYTE-MACROPHAGE-SEROUS FLUID: 41 % — AB (ref 50–90)
Neutrophil Count, Fluid: 15 % (ref 0–25)
Total Nucleated Cell Count, Fluid: 353 cu mm (ref 0–1000)

## 2016-09-30 LAB — CULTURE, BLOOD (ROUTINE X 2)
CULTURE: NO GROWTH
Culture: NO GROWTH

## 2016-09-30 LAB — GRAM STAIN

## 2016-09-30 LAB — BASIC METABOLIC PANEL
Anion gap: 10 (ref 5–15)
BUN: 11 mg/dL (ref 6–20)
CALCIUM: 8.9 mg/dL (ref 8.9–10.3)
CO2: 22 mmol/L (ref 22–32)
CREATININE: 0.93 mg/dL (ref 0.61–1.24)
Chloride: 103 mmol/L (ref 101–111)
GFR calc non Af Amer: >60 mL/min (ref >60)
GLUCOSE: 103 mg/dL — AB (ref 65–99)
Potassium: 4.6 mmol/L (ref 3.5–5.1)
Sodium: 135 mmol/L (ref 135–145)

## 2016-09-30 LAB — CULTURE, BODY FLUID W GRAM STAIN -BOTTLE: Culture: NO GROWTH

## 2016-09-30 MED ORDER — LIDOCAINE HCL 1 % IJ SOLN
INTRAMUSCULAR | Status: AC
  Filled 2016-09-30: qty 20

## 2016-09-30 NOTE — Progress Notes (Signed)
Progress Note   Subjective  Patient had another paracentesis today and feels much more comfortable. It was negative for SBP. His edema is improved and tolerating diuretics.   Objective   Vital signs in last 24 hours: Temp:  [97.6 F (36.4 C)-98.2 F (36.8 C)] 98.2 F (36.8 C) (10/18 1321) Pulse Rate:  [41-76] 41 (10/18 1321) Resp:  [13-19] 13 (10/18 1321) BP: (100-119)/(64-70) 102/64 (10/18 1321) SpO2:  [96 %-99 %] 98 % (10/18 1321) Weight:  [165 lb 4.8 oz (75 kg)] 165 lb 4.8 oz (75 kg) (10/17 2149) Last BM Date: 09/29/16 General:    white male in NAD Heart:  Regular rate and rhythm; no murmurs Lungs: Respirations even and unlabored, lungs CTA bilaterally Abdomen:  Soft, nontender, mildly distended. N. Extremities:  Without edema. Neurologic:  Alert and oriented,  grossly normal neurologically. Psych:  Cooperative. Normal mood and affect.  Intake/Output from previous day: 10/17 0701 - 10/18 0700 In: 1250 [P.O.:1200; IV Piggyback:50] Out: -  Intake/Output this shift: Total I/O In: 240 [P.O.:240] Out: -   Lab Results:  Recent Labs  09/28/16 0415  WBC 5.1  HGB 11.0*  HCT 33.8*  PLT 68*   BMET  Recent Labs  09/28/16 0415 09/29/16 0523 09/30/16 0522  NA 137 136 135  K 4.2 4.4 4.6  CL 104 101 103  CO2 28 30 22   GLUCOSE 103* 121* 103*  BUN 10 13 11   CREATININE 0.81 0.74 0.93  CALCIUM 7.8* 7.9* 8.9   LFT No results for input(s): PROT, ALBUMIN, AST, ALT, ALKPHOS, BILITOT, BILIDIR, IBILI in the last 72 hours. PT/INR No results for input(s): LABPROT, INR in the last 72 hours.  Studies/Results: US Paracentesis  Result Date: 09/30/2016 INDICATION: Cirrhosis, hepatitis B/C, recurrent ascites. Request made for diagnostic and therapeutic paracentesis. EXAM: ULTRASOUND GUIDED DIAGNOSTIC AND THERAPEUTIC PARACENTESIS MEDICATIONS: None. COMPLICATIONS: None immediate. PROCEDURE: Informed written consent was obtained from the patient after a discussion of the  risks, benefits and alternatives to treatment. A timeout was performed prior to the initiation of the procedure. Initial ultrasound scanning demonstrates a small amount of ascites within the left lower abdominal quadrant. The left lower abdomen was prepped and draped in the usual sterile fashion. 1% lidocaine was used for local anesthesia. Following this, a Yueh catheter was introduced. An ultrasound image was saved for documentation purposes. The paracentesis was performed. The catheter was removed and a dressing was applied. The patient tolerated the procedure well without immediate post procedural complication. FINDINGS: A total of approximately 1.3 liters of slightly hazy, light yellow fluid was removed. Samples were sent to the laboratory as requested by the clinical team. IMPRESSION: Successful ultrasound-guided diagnostic and therapeutic paracentesis yielding 1.3 liters liters of peritoneal fluid. Read by: Jeananne Rama, PA-C Electronically Signed   By: Malachy Moan M.D.   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 He never had cell count drawn during initial paracentesis, culture was negative and don't think he truly had SBP but cannot say at this point. Repeat paracentesis today shows no evidence of SBP. He has been treated for it regardless and completed course for it. He otherwise has a history of giant gastric ulcer which warrants follow up EGD, but he is asymptomatic from this, eating well.   At this time recommend the following: - stable for discharge at this time  -  discharge on aldactone 100mg  daily, lasix 40mg  daily. He will need BMET next week to ensure stable electrolytes, our nurse will contact him for this - low sodium diet (<2gm/day) - continue lactulose - hep B DNA should be sent - follow up with ID as outpatient for hep B/C therapy - our office will contact him to  coordinate follow up EGD to ensure healing of giant gastric ulcer, he should continue PPI in the interim  Ileene PatrickSteven Armbruster, MD [facility] Pager [phone]

## 2016-09-30 NOTE — Progress Notes (Signed)
PROGRESS NOTE    Dan [name] Lenzen  RJJ:[id number] DOB: Feb 21, 1971 DOA: 09/25/2016 PCP: No PCP Per Patient  Brief Narrative: Dan [name] Dan [name] is a 45 y.o. 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 He reports chronic protuberant abdomen that has enlarged over the past 24 hours. This has been accompanied by aching pain throughout the abdomen, nonradiating, severe, constant, associated with nausea and vomiting x2 last night with red blood in it. He has taken no medications, including NSAIDs. Denies alcohol use. He was admitted in August for hematemesis found to have cirrhosis and giant gastric ulcer on EGD. He was to take PPI and carafate and [facility] and [facility] for repeat EGD, and [facility] but never did. On arrival to the ED he was in no distress, afebrile with normal vital signs. A distended, protuberant abdomen with fluid wave was noted and diagnostic paracentesis was performed in the ED. Antibiotics were started and [facility] called to admit. Complete metabolic panel largely unremarkable, albumin 2.5. CBC abnormal with platelets 79. Lactate normal, UA uninfected, CXR neg, UDS + benzos. Ascitic fluid analysis pending.   Review of Systems: Denies fever, chills, weight loss, changes in vision or hearing, headache, cough, sore throat, chest pain, palpitations, shortness of breath, changes in bowel habits, melena, blood in stool, change in bladder habits, myalgias, arthralgias, and rash. And per HPI. All others reviewed and are negative.     Assessment & Plan:   Active Problems:   Other cirrhosis of liver (HCC)   Hematemesis   Chronic giant gastric ulcer   Hepatitis B infection without delta agent without hepatic coma   Chronic hepatitis C without hepatic coma (HCC)   SBP (spontaneous bacterial peritonitis) (HCC)   Spontaneous bacterial peritonitis (HCC)  Acute abdominal pain in chronic  cirrhotic patient.  Ascites: - Paracentesis performed in ED, no significant WBC, follow culture ascites  fluid.  - Follow up  LD, glucose, gram stain negative, culture no growth to date.  - Continue with ceftriaxone.  -underwent paracentesis, only 5 L removed 10-15.  Albumin ordered after paracentesis.  -continue with spironolactone, lasix.  -continue with lactulose.  -Paracentesis repeated today 10-17 , follow WBC   Hematemesis without anemia: With history of EGD Aug 2017 showing very large gastric ulcer by Dr. Adela LankArmbruster. Plan was for repeat EGD in 6 weeks. Nonadherent to PPI and carafate.  - Clear liquids for now - hb at 11, follow trend. Denies further hematemesis.  -Protonix BID.  -hb stable.    Chronic hepatitis B and C: Hep C genotype 1a, quant 53k. + Hep B titers in Aug 2017.  - [facility].     DVT prophylaxis: scd. Code Status: Full code.  Family Communication: care discussed with patient  Disposition Plan: home in 24 hours.    Consultants:   GI, Dr Leone PayorGessner.    Procedures:   Paracentesis    Antimicrobials:   ceftriaxone 10-13   Subjective: Report mild abdominal pain  Just came from paracentesis. Doesn't have family available for tonight.     Objective: Vitals:   09/30/16 1039 09/30/16 1057 09/30/16 1059 09/30/16 1321  BP: 106/69 119/70 110/67 102/64  Pulse:    (!) 41  Resp:    13  Temp:    98.2 F (36.8 C)  TempSrc:    Oral  SpO2:  98%  Weight:      Height:        Intake/Output Summary (Last 24 hours) at 09/30/16 1441 Last data filed at 09/30/16 0948  Gross per 24 hour  Intake              720 ml  Output                0 ml  Net              720 ml   Filed Weights   09/25/16 2115 09/28/16 2106 09/29/16 2149  Weight: 79.2 kg (174 lb 9.6 oz) 77.6 kg (171 lb 1.6 oz) 75 kg (165 lb 4.8 oz)    Examination:  General exam: Appears calm and comfortable  Respiratory system: Clear to  auscultation. Respiratory effort normal. Cardiovascular system: S1 & S2 heard, RRR. No JVD, murmurs, rubs, gallops or clicks. No pedal edema. Gastrointestinal system: Abdomen is less distended, umbilical hernia, soft and nontender. Normal bowel sounds heard. Central nervous system: Alert and oriented. No focal neurological deficits. Extremities: Symmetric 5 x 5 power. Skin: No rashes, lesions or ulcers Psychiatry: Judgement and insight appear normal. Mood & affect appropriate.     Data Reviewed: I have personally reviewed following labs and imaging studies  CBC:  Recent Labs Lab 09/25/16 0944 09/25/16 1006 09/26/16 0733 09/27/16 0501 09/28/16 0415  WBC 6.7 DUPLICATE SEE [id number] 5.9 6.4 5.1  NEUTROABS  --  5.1  --   --   --   HGB 13.7 DUPLICATE SEE [id number] 11.5* 11.4* 11.0*  HCT 41.4 DUPLICATE SEE F63540 36.1* 35.5* 33.8*  MCV 92.0 DUPLICATE SEE F63540 93.5 93.4 92.3  PLT 79* DUPLICATE SEE [id number] 76* 74* 68*   Basic Metabolic Panel:  Recent Labs Lab 09/26/16 0733 09/27/16 0501 09/28/16 0415 09/29/16 0523 09/30/16 0522  NA 137 136 137 136 135  K 3.8 4.1 4.2 4.4 4.6  CL 104 105 104 101 103  CO2 28 28 28 30 22   GLUCOSE 142* 125* 103* 121* 103*  BUN 6 12 10 13 11   CREATININE 0.84 0.84 0.81 0.74 0.93  CALCIUM 7.7* 7.7* 7.8* 7.9* 8.9   GFR: Estimated Creatinine Clearance: 104.2 mL/min (by C-G formula based on SCr of 0.93 mg/dL). Liver Function Tests:  Recent Labs Lab 09/25/16 0944 09/26/16 0733  AST 38 29  ALT 26 22  ALKPHOS 135* 109  BILITOT 0.4 0.7  PROT 7.2 6.4*  ALBUMIN 2.5* 2.0*    Recent Labs Lab 09/25/16 0944  LIPASE 27   No results for input(s): AMMONIA in the last 168 hours. Coagulation Profile:  Recent Labs Lab 09/25/16 1006  INR 1.28   Cardiac Enzymes: No results for input(s): CKTOTAL, CKMB, CKMBINDEX, TROPONINI in the last 168 hours. BNP (last 3 results) No results for input(s): PROBNP in the last 8760 hours. HbA1C: No results for  input(s): HGBA1C in the last 72 hours. CBG: No results for input(s): GLUCAP in the last 168 hours. Lipid Profile: No results for input(s): CHOL, HDL, LDLCALC, TRIG, CHOLHDL, LDLDIRECT in the last 72 hours. Thyroid Function Tests: No results for input(s): TSH, T4TOTAL, FREET4, T3FREE, THYROIDAB in the last 72 hours. Anemia Panel: No results for input(s): VITAMINB12, FOLATE, FERRITIN, TIBC, IRON, RETICCTPCT in the last 72 hours. Sepsis Labs:  Recent Labs Lab 09/25/16 1017  LATICACIDVEN 1.65    Recent Results (from the past 240 hour(s))  Blood culture (routine x 2)     Status: None (Preliminary result)   Collection  Time: 09/25/16 12:20 PM  Result Value Ref Range Status   Specimen Description BLOOD LEFT ANTECUBITAL  Final   Special Requests BOTTLES DRAWN AEROBIC ONLY 6CC  Final   Culture NO GROWTH 4 DAYS  Final   Report Status PENDING  Incomplete  Blood culture (routine x 2)     Status: None (Preliminary result)   Collection Time: 09/25/16 12:25 PM  Result Value Ref Range Status   Specimen Description BLOOD LEFT HAND  Final   Special Requests IN PEDIATRIC BOTTLE 4CC  Final   Culture NO GROWTH 4 DAYS  Final   Report Status PENDING  Incomplete  Culture, body fluid-bottle     Status: None (Preliminary result)   Collection Time: 09/25/16  3:05 PM  Result Value Ref Range Status   Specimen Description PERITONEAL  Final   Special Requests NONE  Final   Culture NO GROWTH 4 DAYS  Final   Report Status PENDING  Incomplete  Gram stain     Status: None   Collection Time: 09/25/16  3:05 PM  Result Value Ref Range Status   Specimen Description PERITONEAL  Final   Special Requests NONE  Final   Gram Stain   Final    FEW WBC PRESENT,BOTH PMN AND MONONUCLEAR NO ORGANISMS SEEN    Report Status 09/26/2016 FINAL  Final         Radiology Studies: US Paracentesis  Result Date: 09/30/2016 INDICATION: Cirrhosis, hepatitis B/C, recurrent ascites. Request made for diagnostic and  therapeutic paracentesis. EXAM: ULTRASOUND GUIDED DIAGNOSTIC AND THERAPEUTIC PARACENTESIS MEDICATIONS: None. COMPLICATIONS: None immediate. PROCEDURE: Informed written consent was obtained from the patient after a discussion of the risks, benefits and alternatives to treatment. A timeout was performed prior to the initiation of the procedure. Initial ultrasound scanning demonstrates a small amount of ascites within the left lower abdominal quadrant. The left lower abdomen was prepped and draped in the usual sterile fashion. 1% lidocaine was used for local anesthesia. Following this, a Yueh catheter was introduced. An ultrasound image was saved for documentation purposes. The paracentesis was performed. The catheter was removed and a dressing was applied. The patient tolerated the procedure well without immediate post procedural complication. FINDINGS: A total of approximately 1.3 liters of slightly hazy, light yellow fluid was removed. Samples were sent to the laboratory as requested by the clinical team. IMPRESSION: Successful ultrasound-guided diagnostic and therapeutic paracentesis yielding 1.3 liters liters of peritoneal fluid. Read by: Jeananne Rama, PA-C Electronically Signed   By: Malachy Moan M.D.   On: 09/30/2016 11:25        Scheduled Meds: . lidocaine      . cefTRIAXone (ROCEPHIN)  IV  2 g Intravenous Q24H  . furosemide  20 mg Oral BID  . Influenza vac split quadrivalent PF  0.5 mL Intramuscular Tomorrow-1000  . lactulose  20 g Oral Daily  . pantoprazole  40 mg Oral BID  . sodium chloride flush  3 mL Intravenous Q12H  . spironolactone  50 mg Oral BID  . sucralfate  1 g Oral TID AC & HS   Continuous Infusions:    LOS: 5 days    Time spent: 35 minutes.     Alba Cory, MD [facility] Pager [phone]  If 7PM-7AM, please contact night-coverage [email or web address] Password [email or web address] 09/30/2016, 2:41 PM

## 2016-09-30 NOTE — Progress Notes (Signed)
PT Cancellation Note  Patient Details Name: Dan [name] MRN: [id number] DOB: 26-Mar-1971   Cancelled Treatment:    Reason Eval/Treat Not Completed: PT screened, no needs identified, will sign off. Pt denied need for PT services. Will sign off at pt's request.    Rebeca AlertJannie Khia Dieterich, MPT Pager: [phone]

## 2016-09-30 NOTE — Procedures (Signed)
Ultrasound-guided diagnostic and therapeutic paracentesis performed yielding 1.3 liters of slightly hazy, light yellow  fluid. No immediate complications. A portion of the fluid was sent to the lab for preordered studies.

## 2016-10-01 ENCOUNTER — Other Ambulatory Visit: Payer: Self-pay

## 2016-10-01 ENCOUNTER — Telehealth: Payer: Self-pay

## 2016-10-01 DIAGNOSIS — R109 Unspecified abdominal pain: Secondary | ICD-10-CM

## 2016-10-01 DIAGNOSIS — B1920 Unspecified viral hepatitis C without hepatic coma: Secondary | ICD-10-CM

## 2016-10-01 DIAGNOSIS — B1910 Unspecified viral hepatitis B without hepatic coma: Secondary | ICD-10-CM

## 2016-10-01 DIAGNOSIS — K7469 Other cirrhosis of liver: Secondary | ICD-10-CM

## 2016-10-01 LAB — PATHOLOGIST SMEAR REVIEW: Path Review: REACTIVE

## 2016-10-01 MED ORDER — SPIRONOLACTONE 100 MG PO TABS: 100.0000 mg | ORAL_TABLET | Freq: Every day | ORAL | 0 refills | Status: DC

## 2016-10-01 MED ORDER — PANTOPRAZOLE SODIUM 40 MG PO TBEC: 40.0000 mg | DELAYED_RELEASE_TABLET | Freq: Two times a day (BID) | ORAL | 0 refills | Status: DC

## 2016-10-01 MED ORDER — SUCRALFATE 1 GM/10ML PO SUSP: 1.0000 g | Freq: Four times a day (QID) | ORAL | 0 refills | Status: DC

## 2016-10-01 MED ORDER — ONDANSETRON HCL 4 MG PO TABS: 4.0000 mg | ORAL_TABLET | Freq: Three times a day (TID) | ORAL | 0 refills | Status: DC | PRN

## 2016-10-01 MED ORDER — FUROSEMIDE 40 MG PO TABS: 40.0000 mg | ORAL_TABLET | Freq: Every day | ORAL | 0 refills | Status: DC

## 2016-10-01 MED ORDER — LACTULOSE 10 GM/15ML PO SOLN: 20.0000 g | Freq: Every day | ORAL | 0 refills | Status: DC

## 2016-10-01 NOTE — Progress Notes (Signed)
Patient left unit without letting staff nurse know that he was ready to discharge, patient took his 1000 meds. and claimed that his ride will be here at 4 pm. So I told him that I will prepare his discharge paper  Instructions and prescriptions. After a few minutes he just stepped out of the unit, fully dressed. We tried to contact him or family with  phone number listed but it is out of service.

## 2016-10-01 NOTE — Discharge Summary (Addendum)
PATIENT DETAILS Name: Dan [name] Age: 45 y.o. Sex: male Date of Birth: September 29, 1971 MRN: [id number]. Admitting Physician: Tyrone Nine, MD WUJ:WJXB,JYNWGN F, NP  Admit Date: 09/25/2016 Discharge date: 10/01/2016  Recommendations for Outpatient Follow-up:  1. Follow up with PCP in 1-2 weeks 2. Please obtain BMP/CBC in one week 3. Ensure follow-up with gastroenterology 4. Consider referral to infectious disease clinic for treatment of hepatitis B and hepatitis C.  Admitted From:  Home   Disposition: Home   Home Health: No  Equipment/Devices: None  Discharge Condition: Stable  CODE STATUS: FULL CODE  Diet recommendation:  Heart Healthy   Brief Summary: See H&P, Labs, Consult and Test reports for all details in brief, patient is a 45 year old male with history of cirrhosis, chronic hepatitis B and C who presented with abdominal pain and distended abdomen. He was subsequently admitted for further evaluation and treatment   Brief Hospital Course: Abdominal pain with distention: Suspect abdominal pain was due to tense ascites, no evidence of SBP on ascites fluid analysis. He was empirically started on ceftriaxone. Underwent paracentesis on 10/15 and 10/18. He was also restarted on Lasix and Aldactone, on the day of discharge he told this M.D. that he has been noncompliant to these medications in the past. On exam today, patient's belly was not tense, he had only some mild dullness in the flanks and it was completely nontender. There was very minimal lower extremity edema. Plans are to discharge him on Aldactone 100 mg daily, Lasix 40 mg daily and have him follow up with GI M.D. I have explained to him, that he will not be provided narcotics on discharge-his belly does not appear to be tender and is very benign on my exam. Recommendations are to continue PPI. He is aware that gastroenterology's office, calling for a follow-up appointment.   Chronic hepatitis B and C:  Recommendations are to pursue outpatient treatment at infectious disease clinic. Patient asked to call the ID clinic and get an appointment.  Recent history of upper GI bleeding due to a large gastric ulcer: Recommendations are to continue PPI. He is aware that gastroenterology's office, calling for a follow-up appointment.  Thrombocytopenia: Chronic issue, and likely secondary to underlying cirrhosis.  Procedures/Studies: Paracentesis 10/15 and 10/18  Discharge Diagnoses:  Active Problems:   Other cirrhosis of liver (HCC)   Hematemesis   Chronic giant gastric ulcer   Hepatitis B infection without delta agent without hepatic coma   Chronic hepatitis C without hepatic coma [facility])    Discharge Instructions:  Activity:  As tolerated with Full fall precautions use walker/cane & assistance as needed   Discharge Instructions    Call MD for:  persistant nausea and vomiting    Complete by:  As directed    Diet - low sodium heart healthy    Complete by:  As directed    Discharge instructions    Complete by:  As directed    Follow with Primary MD and with GI MD (office will call you with appointment)  Stay on a low sodium diet (<2gm/day)  Please get a complete blood count and chemistry panel checked by your Primary MD at your next visit, and again as instructed by your Primary MD.  Get Medicines reviewed and adjusted: Please take all your medications with you for your next visit with your Primary MD  Laboratory/radiological data: Please request your Primary MD to go over all hospital tests and procedure/radiological results at the follow up, please ask your  Primary MD to get all Hospital records sent to his/her office.  In some cases, they will be blood work, cultures and biopsy results pending at the time of your discharge. Please request that your primary care M.D. follows up on these results.  Also Note the following: If you experience worsening of your admission symptoms,  develop shortness of breath, life threatening emergency, suicidal or homicidal thoughts you must seek medical attention immediately by calling 911 or calling your MD immediately  if symptoms less severe.  You must read complete instructions/literature along with all the possible adverse reactions/side effects for all the Medicines you take and that have been prescribed to you. Take any new Medicines after you have completely understood and accpet all the possible adverse reactions/side effects.   Do not drive when taking Pain medications or sleeping medications (Benzodaizepines)  Do not take more than prescribed Pain, Sleep and Anxiety Medications. It is not advisable to combine anxiety,sleep and pain medications without talking with your primary care practitioner  Special Instructions: If you have smoked or chewed Tobacco  in the last 2 yrs please stop smoking, stop any regular Alcohol  and or any Recreational drug use.  Wear Seat belts while driving.  Please note: You were cared for by a hospitalist during your hospital stay. Once you are discharged, your primary care physician will handle any further medical issues. Please note that NO REFILLS for any discharge medications will be authorized once you are discharged, as it is imperative that you return to your primary care physician (or establish a relationship with a primary care physician if you do not have one) for your post hospital discharge needs so that they can reassess your need for medications and monitor your lab values.   Increase activity slowly    Complete by:  As directed        Medication List    TAKE these medications   furosemide 40 MG tablet Commonly known as:  LASIX Take 1 tablet (40 mg total) by mouth daily.   lactulose 10 GM/15ML solution Commonly known as:  CHRONULAC Take 30 mLs (20 g total) by mouth daily.   ondansetron 4 MG tablet Commonly known as:  ZOFRAN Take 1 tablet (4 mg total) by mouth every 8 (eight)  hours as needed for nausea or vomiting.   oxyCODONE 5 MG immediate release tablet Commonly known as:  ROXICODONE Take 1 tablet (5 mg total) by mouth every 6 (six) hours as needed for severe pain.   pantoprazole 40 MG tablet Commonly known as:  PROTONIX Take 1 tablet (40 mg total) by mouth 2 (two) times daily.   spironolactone 100 MG tablet Commonly known as:  ALDACTONE Take 1 tablet (100 mg total) by mouth daily.   sucralfate 1 GM/10ML suspension Commonly known as:  CARAFATE Take 10 mLs (1 g total) by mouth every 6 (six) hours.      Follow-up Information    Ruffin FrederickSteven Paul Armbruster, MD .   Specialty:  Gastroenterology Why:  office will call for a follow up appointment Contact information: [address] [place] [address] [phone]        Dema SeverinYORK,[name], NP. Schedule an appointment as soon as possible for a visit in 1 week(s).   Contact information: [address] [place] [address] [phone]        Staci RighterOMER, ROBERT, MD. Schedule an appointment as soon as possible for a visit in 1 month(s).   Specialty:  Infectious Diseases  Why:  for treatment of Hep B and Hep C Contact information: [address] [place] [phone]          No Known Allergies   Consultations:   GI   Other Procedures/Studies: US Paracentesis  Result Date: 09/30/2016 INDICATION: Cirrhosis, hepatitis B/C, recurrent ascites. Request made for diagnostic and therapeutic paracentesis. EXAM: ULTRASOUND GUIDED DIAGNOSTIC AND THERAPEUTIC PARACENTESIS MEDICATIONS: None. COMPLICATIONS: None immediate. PROCEDURE: Informed written consent was obtained from the patient after a discussion of the risks, benefits and alternatives to treatment. A timeout was performed prior to the initiation of the procedure. Initial ultrasound scanning demonstrates a small amount of ascites within the left lower abdominal quadrant. The left lower abdomen was prepped and draped in the  usual sterile fashion. 1% lidocaine was used for local anesthesia. Following this, a Yueh catheter was introduced. An ultrasound image was saved for documentation purposes. The paracentesis was performed. The catheter was removed and a dressing was applied. The patient tolerated the procedure well without immediate post procedural complication. FINDINGS: A total of approximately 1.3 liters of slightly hazy, light yellow fluid was removed. Samples were sent to the laboratory as requested by the clinical team. IMPRESSION: Successful ultrasound-guided diagnostic and therapeutic paracentesis yielding 1.3 liters liters of peritoneal fluid. Read by: Jeananne Rama, PA-C Electronically Signed   By: Malachy Moan M.D.   On: 09/30/2016 11:25   US Paracentesis  Result Date: 09/27/2016 INDICATION: Ascites secondary to alcoholic cirrhosis. Chronic hepatitis B and C. Request for therapeutic paracentesis. EXAM: ULTRASOUND GUIDED RIGHT LATERAL ABDOMEN PARACENTESIS MEDICATIONS: 1% Lidocaine. COMPLICATIONS: None immediate. PROCEDURE: Informed written consent was obtained from the patient after a discussion of the risks, benefits and alternatives to treatment. A timeout was performed prior to the initiation of the procedure. Initial ultrasound scanning demonstrates a large amount of ascites within the right lateral abdomen. The right lateral abdomen was prepped and draped in the usual sterile fashion. 1% lidocaine with epinephrine was used for local anesthesia. Following this, a 19 gauge, 7-cm, Yueh catheter was introduced. An ultrasound image was saved for documentation purposes. The paracentesis was performed. The catheter was removed and a dressing was applied. The patient tolerated the procedure well without immediate post procedural complication. FINDINGS: A total of approximately 5 liters of clear yellow fluid was removed. IMPRESSION: Successful ultrasound-guided paracentesis yielding 5 liters of peritoneal fluid. Read  by:  Corrin Parker, PA-C Electronically Signed   By: Gilmer Mor D.O.   On: 09/27/2016 10:04   Dg Chest Port 1 View  Result Date: 09/25/2016 CLINICAL DATA:  Dyspnea, cough, abdominal pain EXAM: PORTABLE CHEST 1 VIEW COMPARISON:  09/11/2016 FINDINGS: There are low lung volumes. There is bilateral diffuse interstitial thickening likely chronic. There is no focal parenchymal opacity. There is no pleural effusion or pneumothorax. The heart and mediastinal contours are unremarkable. The osseous structures are unremarkable. IMPRESSION: No active cardiopulmonary disease. Electronically Signed   By: Elige Ko   On: 09/25/2016 10:27   Dg Abd Portable 1v  Result Date: 09/27/2016 CLINICAL DATA:  Abdominal pain.  Paracentesis today EXAM: PORTABLE ABDOMEN - 1 VIEW COMPARISON:  None. FINDINGS: The bowel gas pattern is normal. No radio-opaque calculi or other significant radiographic abnormality are seen. IMPRESSION: Negative. These results will be called to the ordering clinician or representative by the Radiologist Assistant, and communication documented in the PACS or zVision Dashboard. Electronically Signed   By: Marlan Palau M.D.   On: 09/27/2016 18:55  TODAY-DAY OF DISCHARGE:  Subjective:   Dan [name] today has no headache,no chest abdominal pain,no new weakness tingling or numbness, feels much better wants to go home today.   Objective:   Blood pressure 107/63, pulse 77, temperature 98.2 F (36.8 C), temperature source Oral, resp. rate 18, height 5\' 11"  (1.803 m), weight 72.4 kg (159 lb 9.6 oz), SpO2 97 %.  Intake/Output Summary (Last 24 hours) at 10/01/16 0910 Last data filed at 10/01/16 0152  Gross per 24 hour  Intake              600 ml  Output                0 ml  Net              600 ml   Filed Weights   09/28/16 2106 09/29/16 2149 09/30/16 2146  Weight: 77.6 kg (171 lb 1.6 oz) 75 kg (165 lb 4.8 oz) 72.4 kg (159 lb 9.6 oz)    Exam: Awake Alert, Oriented *3, No new F.N  deficits, Normal affect [place].AT,PERRAL Supple Neck,No JVD, No cervical lymphadenopathy appriciated.  Symmetrical Chest Bowron movement, Good air movement bilaterally, CTAB RRR,No Gallops,Rubs or new Murmurs, No Parasternal Heave +ve B.Sounds, Abd Soft, Non tender, No organomegaly appriciated, No rebound -guarding or rigidity. No Cyanosis, Clubbing or edema, No new Rash or bruise   PERTINENT RADIOLOGIC STUDIES: US Paracentesis  Result Date: 09/30/2016 INDICATION: Cirrhosis, hepatitis B/C, recurrent ascites. Request made for diagnostic and therapeutic paracentesis. EXAM: ULTRASOUND GUIDED DIAGNOSTIC AND THERAPEUTIC PARACENTESIS MEDICATIONS: None. COMPLICATIONS: None immediate. PROCEDURE: Informed written consent was obtained from the patient after a discussion of the risks, benefits and alternatives to treatment. A timeout was performed prior to the initiation of the procedure. Initial ultrasound scanning demonstrates a small amount of ascites within the left lower abdominal quadrant. The left lower abdomen was prepped and draped in the usual sterile fashion. 1% lidocaine was used for local anesthesia. Following this, a Yueh catheter was introduced. An ultrasound image was saved for documentation purposes. The paracentesis was performed. The catheter was removed and a dressing was applied. The patient tolerated the procedure well without immediate post procedural complication. FINDINGS: A total of approximately 1.3 liters of slightly hazy, light yellow fluid was removed. Samples were sent to the laboratory as requested by the clinical team. IMPRESSION: Successful ultrasound-guided diagnostic and therapeutic paracentesis yielding 1.3 liters liters of peritoneal fluid. Read by: Jeananne Rama, PA-C Electronically Signed   By: Malachy Moan M.D.   On: 09/30/2016 11:25   US Paracentesis  Result Date: 09/27/2016 INDICATION: Ascites secondary to alcoholic cirrhosis. Chronic hepatitis B and C. Request for  therapeutic paracentesis. EXAM: ULTRASOUND GUIDED RIGHT LATERAL ABDOMEN PARACENTESIS MEDICATIONS: 1% Lidocaine. COMPLICATIONS: None immediate. PROCEDURE: Informed written consent was obtained from the patient after a discussion of the risks, benefits and alternatives to treatment. A timeout was performed prior to the initiation of the procedure. Initial ultrasound scanning demonstrates a large amount of ascites within the right lateral abdomen. The right lateral abdomen was prepped and draped in the usual sterile fashion. 1% lidocaine with epinephrine was used for local anesthesia. Following this, a 19 gauge, 7-cm, Yueh catheter was introduced. An ultrasound image was saved for documentation purposes. The paracentesis was performed. The catheter was removed and a dressing was applied. The patient tolerated the procedure well without immediate post procedural complication. FINDINGS: A total of approximately 5 liters of clear yellow fluid was removed. IMPRESSION: Successful ultrasound-guided paracentesis yielding  5 liters of peritoneal fluid. Read by:  Corrin Parker, PA-C Electronically Signed   By: Gilmer Mor D.O.   On: 09/27/2016 10:04   Dg Chest Port 1 View  Result Date: 09/25/2016 CLINICAL DATA:  Dyspnea, cough, abdominal pain EXAM: PORTABLE CHEST 1 VIEW COMPARISON:  09/11/2016 FINDINGS: There are low lung volumes. There is bilateral diffuse interstitial thickening likely chronic. There is no focal parenchymal opacity. There is no pleural effusion or pneumothorax. The heart and mediastinal contours are unremarkable. The osseous structures are unremarkable. IMPRESSION: No active cardiopulmonary disease. Electronically Signed   By: Elige Ko   On: 09/25/2016 10:27   Dg Abd Portable 1v  Result Date: 09/27/2016 CLINICAL DATA:  Abdominal pain.  Paracentesis today EXAM: PORTABLE ABDOMEN - 1 VIEW COMPARISON:  None. FINDINGS: The bowel gas pattern is normal. No radio-opaque calculi or other significant  radiographic abnormality are seen. IMPRESSION: Negative. These results will be called to the ordering clinician or representative by the Radiologist Assistant, and communication documented in the PACS or zVision Dashboard. Electronically Signed   By: Marlan Palau M.D.   On: 09/27/2016 18:55     PERTINENT LAB RESULTS: CBC: No results for input(s): WBC, HGB, HCT, PLT in the last 72 hours. CMET CMP     Component Value Date/Time   NA 135 09/30/2016 0522   K 4.6 09/30/2016 0522   CL 103 09/30/2016 0522   CO2 22 09/30/2016 0522   GLUCOSE 103 (H) 09/30/2016 0522   BUN 11 09/30/2016 0522   CREATININE 0.93 09/30/2016 0522   CALCIUM 8.9 09/30/2016 0522   PROT 6.4 (L) 09/26/2016 0733   ALBUMIN 2.0 (L) 09/26/2016 0733   AST 29 09/26/2016 0733   ALT 22 09/26/2016 0733   ALKPHOS 109 09/26/2016 0733   BILITOT 0.7 09/26/2016 0733   GFRNONAA >60 09/30/2016 0522   GFRAA >60 09/30/2016 0522    GFR Estimated Creatinine Clearance: 100.6 mL/min (by C-G formula based on SCr of 0.93 mg/dL). No results for input(s): LIPASE, AMYLASE in the last 72 hours. No results for input(s): CKTOTAL, CKMB, CKMBINDEX, TROPONINI in the last 72 hours. Invalid input(s): POCBNP No results for input(s): DDIMER in the last 72 hours. No results for input(s): HGBA1C in the last 72 hours. No results for input(s): CHOL, HDL, LDLCALC, TRIG, CHOLHDL, LDLDIRECT in the last 72 hours. No results for input(s): TSH, T4TOTAL, T3FREE, THYROIDAB in the last 72 hours.  Invalid input(s): FREET3 No results for input(s): VITAMINB12, FOLATE, FERRITIN, TIBC, IRON, RETICCTPCT in the last 72 hours. Coags: No results for input(s): INR in the last 72 hours.  Invalid input(s): PT Microbiology: Recent Results (from the past 240 hour(s))  Blood culture (routine x 2)     Status: None   Collection Time: 09/25/16 12:20 PM  Result Value Ref Range Status   Specimen Description BLOOD LEFT ANTECUBITAL  Final   Special Requests BOTTLES DRAWN  AEROBIC ONLY 6CC  Final   Culture NO GROWTH 5 DAYS  Final   Report Status 09/30/2016 FINAL  Final  Blood culture (routine x 2)     Status: None   Collection Time: 09/25/16 12:25 PM  Result Value Ref Range Status   Specimen Description BLOOD LEFT HAND  Final   Special Requests IN PEDIATRIC BOTTLE 4CC  Final   Culture NO GROWTH 5 DAYS  Final   Report Status 09/30/2016 FINAL  Final  Culture, body fluid-bottle     Status: None   Collection Time: 09/25/16  3:05 PM  Result Value Ref Range Status   Specimen Description PERITONEAL  Final   Special Requests NONE  Final   Culture NO GROWTH 5 DAYS  Final   Report Status 09/30/2016 FINAL  Final  Gram stain     Status: None   Collection Time: 09/25/16  3:05 PM  Result Value Ref Range Status   Specimen Description PERITONEAL  Final   Special Requests NONE  Final   Gram Stain   Final    FEW WBC PRESENT,BOTH PMN AND MONONUCLEAR NO ORGANISMS SEEN    Report Status 09/26/2016 FINAL  Final  Gram stain     Status: None   Collection Time: 09/30/16 11:10 AM  Result Value Ref Range Status   Specimen Description PARACENTESIS  Final   Special Requests NONE  Final   Gram Stain   Final    FEW WBC PRESENT,BOTH PMN AND MONONUCLEAR NO ORGANISMS SEEN    Report Status 09/30/2016 FINAL  Final    FURTHER DISCHARGE INSTRUCTIONS:  Get Medicines reviewed and adjusted: Please take all your medications with you for your next visit with your Primary MD  Laboratory/radiological data: Please request your Primary MD to go over all hospital tests and procedure/radiological results at the follow up, please ask your Primary MD to get all Hospital records sent to his/her office.  In some cases, they will be blood work, cultures and biopsy results pending at the time of your discharge. Please request that your primary care M.D. goes through all the records of your hospital data and follows up on these results.  Also Note the following: If you experience worsening  of your admission symptoms, develop shortness of breath, life threatening emergency, suicidal or homicidal thoughts you must seek medical attention immediately by calling 911 or calling your MD immediately  if symptoms less severe.  You must read complete instructions/literature along with all the possible adverse reactions/side effects for all the Medicines you take and that have been prescribed to you. Take any new Medicines after you have completely understood and accpet all the possible adverse reactions/side effects.   Do not drive when taking Pain medications or sleeping medications (Benzodaizepines)  Do not take more than prescribed Pain, Sleep and Anxiety Medications. It is not advisable to combine anxiety,sleep and pain medications without talking with your primary care practitioner  Special Instructions: If you have smoked or chewed Tobacco  in the last 2 yrs please stop smoking, stop any regular Alcohol  and or any Recreational drug use.  Wear Seat belts while driving.  Please note: You were cared for by a hospitalist during your hospital stay. Once you are discharged, your primary care physician will handle any further medical issues. Please note that NO REFILLS for any discharge medications will be authorized once you are discharged, as it is imperative that you return to your primary care physician (or establish a relationship with a primary care physician if you do not have one) for your post hospital discharge needs so that they can reassess your need for medications and monitor your lab values.  Total Time spent coordinating discharge including counseling, education and face to face time equals  45 minutes.  SignedJeoffrey Massed 10/01/2016 9:10 AM

## 2016-10-01 NOTE — Telephone Encounter (Signed)
Ruffin FrederickSteven Paul Armbruster, MD  Leverne HumblesJulia M Fournier, RN        Raynelle FanningJulie,  This patient will be discharged tomorrow. Can you call him maybe Friday for the following issues:   - needs BMET obtained sometime next week  - needs to be scheduled for EGD with me in the next month at his convenience  - need infectious disease consultation if he has not yet had this - for both hepatitis B and hepatitis C - coinfection   Thanks!

## 2016-10-02 NOTE — Telephone Encounter (Signed)
Called patient at [phone], this was a friends phone number. They will get the message to him to call our office. Need to have patient do some follow up lab work next week, get his referral to ID and schedule him for EGD.

## 2016-10-04 ENCOUNTER — Emergency Department (HOSPITAL_COMMUNITY): Payer: Medicaid Other

## 2016-10-04 ENCOUNTER — Encounter (HOSPITAL_COMMUNITY): Payer: Self-pay | Admitting: Emergency Medicine

## 2016-10-04 ENCOUNTER — Inpatient Hospital Stay (HOSPITAL_COMMUNITY)
Admission: EM | Admit: 2016-10-04 | Discharge: 2016-10-08 | DRG: 871 | Discharge status: Against Medical Advice | Payer: Medicaid Other | Attending: Internal Medicine | Admitting: Internal Medicine

## 2016-10-04 DIAGNOSIS — K921 Melena: Secondary | ICD-10-CM | POA: Diagnosis present

## 2016-10-04 DIAGNOSIS — K429 Umbilical hernia without obstruction or gangrene: Secondary | ICD-10-CM | POA: Diagnosis present

## 2016-10-04 DIAGNOSIS — I8510 Secondary esophageal varices without bleeding: Secondary | ICD-10-CM | POA: Diagnosis present

## 2016-10-04 DIAGNOSIS — R1084 Generalized abdominal pain: Secondary | ICD-10-CM

## 2016-10-04 DIAGNOSIS — A419 Sepsis, unspecified organism: Secondary | ICD-10-CM

## 2016-10-04 DIAGNOSIS — D61818 Other pancytopenia: Secondary | ICD-10-CM | POA: Diagnosis present

## 2016-10-04 DIAGNOSIS — Z9114 Patient's other noncompliance with medication regimen: Secondary | ICD-10-CM

## 2016-10-04 DIAGNOSIS — B181 Chronic viral hepatitis B without delta-agent: Secondary | ICD-10-CM | POA: Diagnosis present

## 2016-10-04 DIAGNOSIS — K219 Gastro-esophageal reflux disease without esophagitis: Secondary | ICD-10-CM | POA: Diagnosis present

## 2016-10-04 DIAGNOSIS — B1920 Unspecified viral hepatitis C without hepatic coma: Secondary | ICD-10-CM | POA: Diagnosis present

## 2016-10-04 DIAGNOSIS — Z8711 Personal history of peptic ulcer disease: Secondary | ICD-10-CM

## 2016-10-04 DIAGNOSIS — Z9119 Patient's noncompliance with other medical treatment and regimen: Secondary | ICD-10-CM

## 2016-10-04 DIAGNOSIS — R109 Unspecified abdominal pain: Secondary | ICD-10-CM

## 2016-10-04 DIAGNOSIS — K254 Chronic or unspecified gastric ulcer with hemorrhage: Secondary | ICD-10-CM

## 2016-10-04 DIAGNOSIS — K7460 Unspecified cirrhosis of liver: Secondary | ICD-10-CM | POA: Diagnosis present

## 2016-10-04 DIAGNOSIS — B1910 Unspecified viral hepatitis B without hepatic coma: Secondary | ICD-10-CM | POA: Diagnosis present

## 2016-10-04 DIAGNOSIS — E861 Hypovolemia: Secondary | ICD-10-CM | POA: Diagnosis present

## 2016-10-04 DIAGNOSIS — F17210 Nicotine dependence, cigarettes, uncomplicated: Secondary | ICD-10-CM | POA: Diagnosis present

## 2016-10-04 DIAGNOSIS — K922 Gastrointestinal hemorrhage, unspecified: Secondary | ICD-10-CM | POA: Diagnosis present

## 2016-10-04 DIAGNOSIS — K920 Hematemesis: Secondary | ICD-10-CM | POA: Diagnosis present

## 2016-10-04 DIAGNOSIS — K766 Portal hypertension: Secondary | ICD-10-CM | POA: Diagnosis present

## 2016-10-04 DIAGNOSIS — K652 Spontaneous bacterial peritonitis: Secondary | ICD-10-CM

## 2016-10-04 DIAGNOSIS — R112 Nausea with vomiting, unspecified: Secondary | ICD-10-CM | POA: Diagnosis present

## 2016-10-04 DIAGNOSIS — D649 Anemia, unspecified: Secondary | ICD-10-CM | POA: Diagnosis present

## 2016-10-04 DIAGNOSIS — D696 Thrombocytopenia, unspecified: Secondary | ICD-10-CM | POA: Diagnosis present

## 2016-10-04 DIAGNOSIS — E871 Hypo-osmolality and hyponatremia: Secondary | ICD-10-CM | POA: Diagnosis present

## 2016-10-04 DIAGNOSIS — K7469 Other cirrhosis of liver: Secondary | ICD-10-CM | POA: Diagnosis present

## 2016-10-04 DIAGNOSIS — R6521 Severe sepsis with septic shock: Secondary | ICD-10-CM | POA: Diagnosis present

## 2016-10-04 DIAGNOSIS — R188 Other ascites: Secondary | ICD-10-CM | POA: Diagnosis present

## 2016-10-04 DIAGNOSIS — B377 Candidal sepsis: Secondary | ICD-10-CM

## 2016-10-04 DIAGNOSIS — J9601 Acute respiratory failure with hypoxia: Secondary | ICD-10-CM | POA: Diagnosis present

## 2016-10-04 DIAGNOSIS — B182 Chronic viral hepatitis C: Secondary | ICD-10-CM | POA: Diagnosis present

## 2016-10-04 LAB — COMPREHENSIVE METABOLIC PANEL
ALBUMIN: 2.5 g/dL — AB (ref 3.5–5.0)
ALT: 50 U/L (ref 17–63)
ANION GAP: 9 (ref 5–15)
AST: 81 U/L — ABNORMAL HIGH (ref 15–41)
Alkaline Phosphatase: 189 U/L — ABNORMAL HIGH (ref 38–126)
BUN: 18 mg/dL (ref 6–20)
CHLORIDE: 99 mmol/L — AB (ref 101–111)
CO2: 24 mmol/L (ref 22–32)
CREATININE: 0.83 mg/dL (ref 0.61–1.24)
Calcium: 8.2 mg/dL — ABNORMAL LOW (ref 8.9–10.3)
GFR calc non Af Amer: >60 mL/min (ref >60)
Glucose, Bld: 115 mg/dL — ABNORMAL HIGH (ref 65–99)
Potassium: 5 mmol/L (ref 3.5–5.1)
SODIUM: 132 mmol/L — AB (ref 135–145)
Total Bilirubin: 1 mg/dL (ref 0.3–1.2)
Total Protein: 6.7 g/dL (ref 6.5–8.1)

## 2016-10-04 LAB — URINALYSIS, ROUTINE W REFLEX MICROSCOPIC
BILIRUBIN URINE: NEGATIVE
Glucose, UA: NEGATIVE mg/dL
HGB URINE DIPSTICK: NEGATIVE
KETONES UR: NEGATIVE mg/dL
Leukocytes, UA: NEGATIVE
Nitrite: NEGATIVE
PH: 6 (ref 5.0–8.0)
Protein, ur: NEGATIVE mg/dL
Specific Gravity, Urine: 1.02 (ref 1.005–1.030)

## 2016-10-04 LAB — LACTIC ACID, PLASMA: Lactic Acid, Venous: 2.5 mmol/L (ref 0.5–1.9)

## 2016-10-04 LAB — CBC WITH DIFFERENTIAL/PLATELET
Basophils Absolute: 0 10*3/uL (ref 0.0–0.1)
Basophils Relative: 0 %
Eosinophils Absolute: 0 10*3/uL (ref 0.0–0.7)
Eosinophils Relative: 1 %
HEMATOCRIT: 35.2 % — AB (ref 39.0–52.0)
HEMOGLOBIN: 11.5 g/dL — AB (ref 13.0–17.0)
LYMPHS ABS: 0.3 10*3/uL — AB (ref 0.7–4.0)
Lymphocytes Relative: 8 %
MCH: 29.7 pg (ref 26.0–34.0)
MCHC: 32.7 g/dL (ref 30.0–36.0)
MCV: 91 fL (ref 78.0–100.0)
MONOS PCT: 3 %
Monocytes Absolute: 0.1 10*3/uL (ref 0.1–1.0)
NEUTROS ABS: 3.2 10*3/uL (ref 1.7–7.7)
NEUTROS PCT: 88 %
Platelets: 62 10*3/uL — ABNORMAL LOW (ref 150–400)
RBC: 3.87 MIL/uL — ABNORMAL LOW (ref 4.22–5.81)
RDW: 14.3 % (ref 11.5–15.5)
WBC: 3.7 10*3/uL — ABNORMAL LOW (ref 4.0–10.5)

## 2016-10-04 LAB — TYPE AND SCREEN
ABO/RH(D): O NEG
ANTIBODY SCREEN: NEGATIVE

## 2016-10-04 LAB — CK: Total CK: 34 U/L — ABNORMAL LOW (ref 49–397)

## 2016-10-04 LAB — PROTIME-INR
INR: 1.35
PROTHROMBIN TIME: 16.7 s — AB (ref 11.4–15.2)

## 2016-10-04 LAB — I-STAT CG4 LACTIC ACID, ED: Lactic Acid, Venous: 3.13 mmol/L (ref 0.5–1.9)

## 2016-10-04 LAB — PROCALCITONIN: Procalcitonin: 0.45 ng/mL

## 2016-10-04 LAB — TROPONIN I

## 2016-10-04 LAB — LIPASE, BLOOD: LIPASE: 35 U/L (ref 11–51)

## 2016-10-04 LAB — APTT: APTT: 57 s — AB (ref 24–36)

## 2016-10-04 MED ORDER — ONDANSETRON HCL 4 MG/2ML IJ SOLN
4.0000 mg | Freq: Once | INTRAMUSCULAR | Status: AC
  Administered 2016-10-04: 4 mg via INTRAVENOUS
  Filled 2016-10-04: qty 2

## 2016-10-04 MED ORDER — HYDROMORPHONE HCL 2 MG/ML IJ SOLN
1.0000 mg | Freq: Once | INTRAMUSCULAR | Status: AC
  Administered 2016-10-04: 1 mg via INTRAVENOUS
  Filled 2016-10-04: qty 1

## 2016-10-04 MED ORDER — PIPERACILLIN-TAZOBACTAM 3.375 G IVPB: 3.3750 g | Freq: Three times a day (TID) | INTRAVENOUS | Status: DC

## 2016-10-04 MED ORDER — SODIUM CHLORIDE 0.9 % IV BOLUS (SEPSIS)
1000.0000 mL | Freq: Once | INTRAVENOUS | Status: AC
  Administered 2016-10-04: 1000 mL via INTRAVENOUS

## 2016-10-04 MED ORDER — SODIUM CHLORIDE 0.9 % IV BOLUS (SEPSIS)
250.0000 mL | Freq: Once | INTRAVENOUS | Status: AC
  Administered 2016-10-04: 250 mL via INTRAVENOUS

## 2016-10-04 MED ORDER — PIPERACILLIN-TAZOBACTAM 3.375 G IVPB 30 MIN
3.3750 g | Freq: Once | INTRAVENOUS | Status: AC
  Administered 2016-10-04: 3.375 g via INTRAVENOUS
  Filled 2016-10-04: qty 50

## 2016-10-04 MED ORDER — IOPAMIDOL (ISOVUE-300) INJECTION 61%
INTRAVENOUS | Status: AC
  Administered 2016-10-04: 100 mL
  Filled 2016-10-04: qty 100

## 2016-10-04 NOTE — ED Provider Notes (Signed)
[facility] DEPT Provider Note   CSN: [id number] Arrival date & time: 10/04/16  2036     History   Chief Complaint Chief Complaint  Patient presents with  . Fever    HPI Dan [name] is a 45 y.o. male with a hx of cirrhosis, hepatitis B and C, and recent admission from 10/13-10/19 for workup of generalized abdominal pain, at the time concern was raised for SBP, but the paracentesis fluid was negative for infection. It was thought at that time that his diffuse abdominal pain was due to tense ascites. He presents today noting a 2 day history of progressively worsening andominal pain, predominantly in his b/l flanks, significantly worse on the R, in the RLQ region. He states that his pain started yesterday, accompanied by nausea and vomiting, with some blood tinged dark colored emesis. No frank hematemesis. No diarrhea, blood in stool or melena. He states that today he began having a fever, and myalgias. He had a syncopal episode about 2 hours prior to arrival where in the patient passed out after standing and walking into another room. He states that he woke up on the floor about 2 hours later and called EMS.  Other than the recent paracentesis he has no other abdominal surgical history.   On arrival the patient is febrile and tachycardic but with stable blood pressure. Appearing ill and complaining of 10/10 abdominal pain and nausea. He states that he has been noncompliant with hx rx'd medications.   HPI  Past Medical History:  Diagnosis Date  . Cirrhosis of liver ([place])   . Gastric ulceration   . Hepatitis B   . Hepatitis C   . Polysubstance abuse   . Upper GI bleed     Patient Active Problem List   Diagnosis Date Noted  . Nausea & vomiting 10/05/2016  . Sepsis ([place]) 10/05/2016  . SBP (spontaneous bacterial peritonitis) ([place]) 09/25/2016  . Spontaneous bacterial peritonitis ([place]) 09/25/2016  . Ascites   . Decompensated HCV cirrhosis ([place])   . Chronic giant gastric ulcer     . Hepatitis B infection without delta agent without hepatic coma   . Chronic hepatitis C without hepatic coma ([place])   . Other cirrhosis of liver ([place]) 07/14/2016  . Abdominal pain 07/14/2016  . Gastritis   . Hematemesis     Past Surgical History:  Procedure Laterality Date  . ESOPHAGOGASTRODUODENOSCOPY N/A 07/16/2016   Procedure: ESOPHAGOGASTRODUODENOSCOPY (EGD);  Surgeon: Manus Gunning, MD;  Location: [facility];  Service: Gastroenterology;  Laterality: N/A;  . LUNG SURGERY         Home Medications    Prior to Admission medications   Medication Sig Start Date End Date Taking? Authorizing Provider  furosemide (LASIX) 40 MG tablet Take 1 tablet (40 mg total) by mouth daily. Patient not taking: Reported on 10/04/2016 10/01/16   Jonetta Osgood, MD  lactulose (CHRONULAC) 10 GM/15ML solution Take 30 mLs (20 g total) by mouth daily. Patient not taking: Reported on 10/04/2016 10/01/16   Jonetta Osgood, MD  ondansetron (ZOFRAN) 4 MG tablet Take 1 tablet (4 mg total) by mouth every 8 (eight) hours as needed for nausea or vomiting. Patient not taking: Reported on 10/04/2016 10/01/16   Jonetta Osgood, MD  oxyCODONE (ROXICODONE) 5 MG immediate release tablet Take 1 tablet (5 mg total) by mouth every 6 (six) hours as needed for severe pain. Patient not taking: Reported on 10/04/2016 07/17/16   Ophelia Shoulder, MD  pantoprazole (PROTONIX) 40 MG  tablet Take 1 tablet (40 mg total) by mouth 2 (two) times daily. Patient not taking: Reported on 10/04/2016 10/01/16   Jonetta Osgood, MD  spironolactone (ALDACTONE) 100 MG tablet Take 1 tablet (100 mg total) by mouth daily. Patient not taking: Reported on 10/04/2016 10/01/16   Jonetta Osgood, MD  sucralfate (CARAFATE) 1 GM/10ML suspension Take 10 mLs (1 g total) by mouth every 6 (six) hours. Patient not taking: Reported on 10/04/2016 10/01/16   Jonetta Osgood, MD    Family History No family history on file.  Social  History Social History  Substance Use Topics  . Smoking status: Current Every Day Smoker    Packs/day: 0.50    Types: Cigarettes  . Smokeless tobacco: Never Used  . Alcohol use No     Allergies   Review of patient's allergies indicates no known allergies.   Review of Systems Review of Systems  Constitutional: Positive for activity change, appetite change, chills, diaphoresis, fatigue and fever.  HENT: Negative for congestion, rhinorrhea, sinus pressure, sneezing and sore throat.   Respiratory: Negative for cough, chest tightness and shortness of breath.   Cardiovascular: Negative for chest pain.  Gastrointestinal: Positive for abdominal pain, nausea and vomiting. Negative for abdominal distention, blood in stool and diarrhea.  Genitourinary: Negative for difficulty urinating, dysuria, frequency, hematuria and urgency.  Musculoskeletal: Negative for back pain and neck pain.  Skin: Negative for rash and wound.  Neurological: Positive for syncope and light-headedness. Negative for dizziness, seizures, speech difficulty, weakness, numbness and headaches.  All other systems reviewed and are negative.    Physical Exam Updated Vital Signs BP (!) 103/49   Pulse 79   Temp 98.7 F (37.1 C) (Oral)   Resp 15   Ht '5\' 11"'  (1.803 m)   Wt 72.1 kg   SpO2 95%   BMI 22.18 kg/m   Physical Exam  Constitutional: He is oriented to person, place, and time. He appears distressed.  Cachectic and disheveled appearance  HENT:  Head: Normocephalic and atraumatic.  Nose: Nose normal.  MM dry, oropharynx clear. Poor dentition  Eyes: Conjunctivae and EOM are normal. Pupils are equal, round, and reactive to light.  Neck: Normal range of motion. Neck supple.  Cardiovascular: Regular rhythm, normal heart sounds and intact distal pulses.  Tachycardia present.   Pulmonary/Chest: Effort normal and breath sounds normal.  Abdominal: There is hepatomegaly. There is generalized tenderness (worse in the  RLQ). There is rigidity, guarding and tenderness at McBurney's point. There is no rebound and negative Murphy's sign. A hernia is present. Hernia confirmed positive in the ventral area.  Musculoskeletal: He exhibits no edema or tenderness.  Neurological: He is alert and oriented to person, place, and time. He has normal strength. No cranial nerve deficit or sensory deficit. Coordination normal. GCS eye subscore is 4. GCS verbal subscore is 5. GCS motor subscore is 6.  Skin: Skin is warm. No rash noted. He is diaphoretic.  Nursing note and vitals reviewed.    ED Treatments / Results  Labs (all labs ordered are listed, but only abnormal results are displayed) Labs Reviewed  COMPREHENSIVE METABOLIC PANEL - Abnormal; Notable for the following:       Result Value   Sodium 132 (*)    Chloride 99 (*)    Glucose, Bld 115 (*)    Calcium 8.2 (*)    Albumin 2.5 (*)    AST 81 (*)    Alkaline Phosphatase 189 (*)    All other  components within normal limits  CBC WITH DIFFERENTIAL/PLATELET - Abnormal; Notable for the following:    WBC 3.7 (*)    RBC 3.87 (*)    Hemoglobin 11.5 (*)    HCT 35.2 (*)    Platelets 62 (*)    Lymphs Abs 0.3 (*)    All other components within normal limits  LACTIC ACID, PLASMA - Abnormal; Notable for the following:    Lactic Acid, Venous 2.5 (*)    All other components within normal limits  APTT - Abnormal; Notable for the following:    aPTT 57 (*)    All other components within normal limits  PROTIME-INR - Abnormal; Notable for the following:    Prothrombin Time 16.7 (*)    All other components within normal limits  CK - Abnormal; Notable for the following:    Total CK 34 (*)    All other components within normal limits  I-STAT CG4 LACTIC ACID, ED - Abnormal; Notable for the following:    Lactic Acid, Venous 3.13 (*)    All other components within normal limits  CULTURE, BLOOD (ROUTINE X 2)  CULTURE, BLOOD (ROUTINE X 2)  URINE CULTURE  URINALYSIS, ROUTINE W  REFLEX MICROSCOPIC (NOT AT [facility])  LIPASE, BLOOD  PROCALCITONIN  TROPONIN I  I-STAT CG4 LACTIC ACID, ED  TYPE AND SCREEN    EKG  EKG Interpretation None       Radiology Ct Abdomen Pelvis W Contrast  Result Date: 10/05/2016 CLINICAL DATA:  Abdominal pain and swelling, vomiting blood recent paracentesis EXAM: CT ABDOMEN AND PELVIS WITH CONTRAST TECHNIQUE: Multidetector CT imaging of the abdomen and pelvis was performed using the standard protocol following bolus administration of intravenous contrast. CONTRAST:  147m ISOVUE-300 IOPAMIDOL (ISOVUE-300) INJECTION 61% COMPARISON:  09/30/2016, 07/14/2016 FINDINGS: Lower chest: Patchy dependent atelectasis. Nodular atelectasis or scarring in the posterior right lung base. No effusion. Heart size normal. No large pericardial effusion. Hepatobiliary: Nodular contour of the liver consistent with cirrhosis. Heterogenous liver enhancement but without discrete mass. Portal veins and splenic vein appear patent. The gallbladder is dilated but decreased compared to prior. No intra or extrahepatic biliary dilatation. Pancreas: Unremarkable. No pancreatic ductal dilatation or surrounding inflammatory changes. Spleen: Markedly enlarged at 20 cm. Adrenals/Urinary Tract: Adrenal glands are within normal limits. Small calcified stone in the mid to upper right kidney. No hydronephrosis. Bladder is normal. Stomach/Bowel: The stomach is collapsed. There is distal esophageal thickening. No dilated large or small bowel. Probable esophageal varices. Vascular/Lymphatic: Aortic caliber is normal. There is atherosclerosis present. No grossly enlarged lymph nodes. Reproductive: Prostate gland slightly enlarged with few calcifications. Other: No free air. Small to moderate ascites, decreased compared to comparison exam. Musculoskeletal: No acute osseous abnormality. Degenerative changes of the spine with gas within the disc space and posterior to the L4 vertebral body. IMPRESSION:  1. Nodular liver contour or consistent with cirrhosis. Findings consistent with portal hypertension including marked splenomegaly and probable esophageal varices. 2. Small to moderate volume of ascites, decreased compared to prior. 3. Distal esophageal thickening could relate to esophagitis. 4. Dilated gallbladder but decreased compared to prior. No intra or extrahepatic biliary dilatation. 5. Nonobstructing stone in the right kidney. Electronically Signed   By: KDonavan FoilM.D.   On: 10/05/2016 01:51   Dg Chest Port 1 View  Result Date: 10/04/2016 CLINICAL DATA:  Sepsis. EXAM: PORTABLE CHEST 1 VIEW COMPARISON:  09/25/2016 FINDINGS: Cardiomediastinal silhouette is within normal limits. Diffuse interstitial coarsening is unchanged and compatible with chronic lung  disease. There is no evidence of acute airspace consolidation, overt edema, pleural effusion, or pneumothorax. No acute osseous abnormality is seen. IMPRESSION: No active disease. Electronically Signed   By: Logan Bores M.D.   On: 10/04/2016 22:25    Procedures Procedures (including critical care time)  Medications Ordered in ED Medications  piperacillin-tazobactam (ZOSYN) IVPB 3.375 g (not administered)  vancomycin (VANCOCIN) 1,500 mg in sodium chloride 0.9 % 500 mL IVPB (1,500 mg Intravenous New Bag/Given 10/05/16 0055)  HYDROmorphone (DILAUDID) injection 1 mg (not administered)  sodium chloride 0.9 % bolus 1,000 mL (0 mLs Intravenous Stopped 10/04/16 2153)    And  sodium chloride 0.9 % bolus 1,000 mL (0 mLs Intravenous Stopped 10/04/16 2217)    And  sodium chloride 0.9 % bolus 250 mL (0 mLs Intravenous Stopped 10/04/16 2217)  piperacillin-tazobactam (ZOSYN) IVPB 3.375 g (0 g Intravenous Stopped 10/04/16 2204)  HYDROmorphone (DILAUDID) injection 1 mg (1 mg Intravenous Given 10/04/16 2125)  ondansetron (ZOFRAN) injection 4 mg (4 mg Intravenous Given 10/04/16 2125)  HYDROmorphone (DILAUDID) injection 1 mg (1 mg Intravenous Given  10/04/16 2249)  iopamidol (ISOVUE-300) 61 % injection (100 mLs  Contrast Given 10/04/16 2310)  sodium chloride 0.9 % bolus 1,000 mL (0 mLs Intravenous Stopped 10/05/16 0217)     Initial Impression / Assessment and Plan / ED Course  I have reviewed the triage vital signs and the nursing notes.  Pertinent labs & imaging results that were available during my care of the patient were reviewed by me and considered in my medical decision making (see chart for details).  Clinical Course  45 y.o. male presents with concern for sepsis with tachycardia to the 120's, febrile to 103.0. Code sepsis ordered. Fluid resuscitated with 30cc/kg bolus, initially. Given Zosyn empirically for abdominal etiology for sx.  Labs returned showing lactic of 3.13, WBC, 3.7, Hg 11.5, plt 62, troponin negative, alk phos 189, AST 81, ALT 50, procal 0.45. UA neg, CXR Neg.   Lactate cleared, repeat at 0.9.   While awaiting CT scan results his blood pressure dropped again down to MAP less than 65. He was given an addition fluid bolus and improved.   CT abd pelvis was done and showed no acute surgical process. Concern raised for SBP given recent paracentesis, but only small volume ascites fluid remaining in abdomen. Feel that the patient will require IR tap to further assess fluid.  He was then admitted to the hospitalist stepdown service for further care and assessment.     Sepsis - Repeat Assessment  Performed at:    12:00AM  Vitals     Blood pressure 99/59, pulse 97, temperature (!) 103.9 F (39.9 C), temperature source Rectal, resp. rate 16, height '5\' 11"'  (1.803 m), weight 72.1 kg, SpO2 94 %.  Heart:     Regular rate and rhythm  Lungs:    CTA  Capillary Refill:   <2 sec  Peripheral Pulse:   Radial pulse palpable  Skin:     Normal Color   This patient meets SIRS Criteria and may be septic. SIRS = Systemic Inflammatory Response Syndrome  Best Practice Recommends:   Notify the nurse immediately to  increase monitoring of patient.   The recent clinical data is shown below. Vitals:   10/05/16 0100 10/05/16 0115 10/05/16 0130 10/05/16 0200  BP: '90/63 92/69 93/62 ' (!) 103/49  Pulse: 84  84 79  Resp: '14 14 15 15  ' Temp:      TempSrc:      SpO2:  93% 94% 95% 95%  Weight:      Height:         Final Clinical Impressions(s) / ED Diagnoses   Final diagnoses:  Sepsis, due to unspecified organism [facility])  Generalized abdominal pain  SBP (spontaneous bacterial peritonitis) [facility])    New Prescriptions New Prescriptions   No medications on file     Zenovia Jarred, DO 10/05/16 0224    Elnora Morrison, MD 10/10/16 [phone]

## 2016-10-04 NOTE — ED Notes (Signed)
ED-P made aware of critical Lactic

## 2016-10-04 NOTE — ED Notes (Signed)
MD at bedside. 

## 2016-10-04 NOTE — ED Triage Notes (Signed)
Per [facility]: Pt to ED from home c/o abd pain, N/V with black and bright red emesis x 2 days. Pt also reports fevers at home (highest 102F). Pt has liver failure, hernia. Was here 2 days ago for same symptoms and d/c home. EMS VS: Temp 102F, HR 119 ST, 99% RA, 118/60, CBG 128. Pt A&O x 4.

## 2016-10-04 NOTE — ED Notes (Signed)
Called [facility] to activate code sepsis  

## 2016-10-04 NOTE — Progress Notes (Signed)
Pharmacy Antibiotic Note  Dan [name] is a 45 y.o. male admitted on 10/04/2016 with  c/o abd pain, N/V with black and bright red emesis x 2 days and fever Tm 102F.  Pharmacy has been consulted for Zosyn dosing for intra-abdominal infection .  Plan: Zosyn 3.375 g IV q8h infuse each dose over 4 hours Monitor clinical   Height: 5\' 11"  (180.3 cm) Weight: 159 lb (72.1 kg) IBW/kg (Calculated) : 75.3  Temp (24hrs), Avg:103.9 F (39.9 C), Min:103.9 F (39.9 C), Max:103.9 F (39.9 C)   Recent Labs Lab 09/28/16 0415 09/29/16 0523 09/30/16 0522  WBC 5.1  --   --   CREATININE 0.81 0.74 0.93    Estimated Creatinine Clearance: 100.1 mL/min (by C-G formula based on SCr of 0.93 mg/dL).    No Known Allergies  Antimicrobials this admission: 10/22 Zosyn >>   Dose adjustments this admission:   Microbiology results: 10/22 ZOX:WRUEBCx:sent  10/22 AVW:UJWJCx:sent  Thank you for allowing pharmacy to be a part of this patient's care. Noah Delaineuth Syrina Wake, RPh Clinical Pharmacist Pager: [phone] 10/04/2016 9:03 PM

## 2016-10-04 NOTE — ED Notes (Signed)
Pt transported to CT ?

## 2016-10-04 NOTE — ED Notes (Signed)
Placed pt on 2L O2 nasal cannula d/t SpO2 decreasing to 87% RA following pain medication administration.

## 2016-10-05 ENCOUNTER — Encounter (HOSPITAL_COMMUNITY): Payer: Self-pay | Admitting: Internal Medicine

## 2016-10-05 DIAGNOSIS — K7011 Alcoholic hepatitis with ascites: Secondary | ICD-10-CM

## 2016-10-05 DIAGNOSIS — A419 Sepsis, unspecified organism: Secondary | ICD-10-CM | POA: Diagnosis present

## 2016-10-05 DIAGNOSIS — K766 Portal hypertension: Secondary | ICD-10-CM | POA: Diagnosis present

## 2016-10-05 DIAGNOSIS — Z9119 Patient's noncompliance with other medical treatment and regimen: Secondary | ICD-10-CM | POA: Diagnosis not present

## 2016-10-05 DIAGNOSIS — B1910 Unspecified viral hepatitis B without hepatic coma: Secondary | ICD-10-CM | POA: Diagnosis present

## 2016-10-05 DIAGNOSIS — B182 Chronic viral hepatitis C: Secondary | ICD-10-CM | POA: Diagnosis present

## 2016-10-05 DIAGNOSIS — R1033 Periumbilical pain: Secondary | ICD-10-CM

## 2016-10-05 DIAGNOSIS — K652 Spontaneous bacterial peritonitis: Secondary | ICD-10-CM | POA: Diagnosis present

## 2016-10-05 DIAGNOSIS — J9601 Acute respiratory failure with hypoxia: Secondary | ICD-10-CM | POA: Diagnosis present

## 2016-10-05 DIAGNOSIS — K921 Melena: Secondary | ICD-10-CM | POA: Diagnosis present

## 2016-10-05 DIAGNOSIS — E861 Hypovolemia: Secondary | ICD-10-CM | POA: Diagnosis present

## 2016-10-05 DIAGNOSIS — K219 Gastro-esophageal reflux disease without esophagitis: Secondary | ICD-10-CM | POA: Diagnosis present

## 2016-10-05 DIAGNOSIS — K3189 Other diseases of stomach and duodenum: Secondary | ICD-10-CM | POA: Diagnosis not present

## 2016-10-05 DIAGNOSIS — D61818 Other pancytopenia: Secondary | ICD-10-CM | POA: Diagnosis present

## 2016-10-05 DIAGNOSIS — R7881 Bacteremia: Secondary | ICD-10-CM | POA: Diagnosis not present

## 2016-10-05 DIAGNOSIS — B377 Candidal sepsis: Secondary | ICD-10-CM | POA: Diagnosis present

## 2016-10-05 DIAGNOSIS — K254 Chronic or unspecified gastric ulcer with hemorrhage: Secondary | ICD-10-CM

## 2016-10-05 DIAGNOSIS — K7469 Other cirrhosis of liver: Secondary | ICD-10-CM | POA: Diagnosis not present

## 2016-10-05 DIAGNOSIS — K429 Umbilical hernia without obstruction or gangrene: Secondary | ICD-10-CM | POA: Diagnosis present

## 2016-10-05 DIAGNOSIS — K922 Gastrointestinal hemorrhage, unspecified: Secondary | ICD-10-CM | POA: Diagnosis present

## 2016-10-05 DIAGNOSIS — I8510 Secondary esophageal varices without bleeding: Secondary | ICD-10-CM | POA: Diagnosis present

## 2016-10-05 DIAGNOSIS — R1013 Epigastric pain: Secondary | ICD-10-CM | POA: Diagnosis not present

## 2016-10-05 DIAGNOSIS — R6521 Severe sepsis with septic shock: Secondary | ICD-10-CM | POA: Diagnosis present

## 2016-10-05 DIAGNOSIS — B3789 Other sites of candidiasis: Secondary | ICD-10-CM | POA: Diagnosis not present

## 2016-10-05 DIAGNOSIS — D696 Thrombocytopenia, unspecified: Secondary | ICD-10-CM | POA: Diagnosis present

## 2016-10-05 DIAGNOSIS — E871 Hypo-osmolality and hyponatremia: Secondary | ICD-10-CM | POA: Diagnosis present

## 2016-10-05 DIAGNOSIS — D649 Anemia, unspecified: Secondary | ICD-10-CM | POA: Diagnosis present

## 2016-10-05 DIAGNOSIS — K920 Hematemesis: Secondary | ICD-10-CM | POA: Diagnosis present

## 2016-10-05 DIAGNOSIS — R112 Nausea with vomiting, unspecified: Secondary | ICD-10-CM | POA: Diagnosis present

## 2016-10-05 DIAGNOSIS — B181 Chronic viral hepatitis B without delta-agent: Secondary | ICD-10-CM | POA: Diagnosis present

## 2016-10-05 DIAGNOSIS — R1084 Generalized abdominal pain: Secondary | ICD-10-CM

## 2016-10-05 DIAGNOSIS — Z9114 Patient's other noncompliance with medication regimen: Secondary | ICD-10-CM | POA: Diagnosis not present

## 2016-10-05 DIAGNOSIS — F17210 Nicotine dependence, cigarettes, uncomplicated: Secondary | ICD-10-CM | POA: Diagnosis present

## 2016-10-05 DIAGNOSIS — R188 Other ascites: Secondary | ICD-10-CM | POA: Diagnosis present

## 2016-10-05 DIAGNOSIS — B9689 Other specified bacterial agents as the cause of diseases classified elsewhere: Secondary | ICD-10-CM | POA: Diagnosis not present

## 2016-10-05 DIAGNOSIS — K7460 Unspecified cirrhosis of liver: Secondary | ICD-10-CM | POA: Diagnosis present

## 2016-10-05 DIAGNOSIS — B1920 Unspecified viral hepatitis C without hepatic coma: Secondary | ICD-10-CM | POA: Diagnosis present

## 2016-10-05 LAB — COMPREHENSIVE METABOLIC PANEL
ALT: 41 U/L (ref 17–63)
ANION GAP: 5 (ref 5–15)
AST: 59 U/L — ABNORMAL HIGH (ref 15–41)
Albumin: 2.1 g/dL — ABNORMAL LOW (ref 3.5–5.0)
Alkaline Phosphatase: 153 U/L — ABNORMAL HIGH (ref 38–126)
BILIRUBIN TOTAL: 0.7 mg/dL (ref 0.3–1.2)
BUN: 14 mg/dL (ref 6–20)
CO2: 24 mmol/L (ref 22–32)
Calcium: 7.3 mg/dL — ABNORMAL LOW (ref 8.9–10.3)
Chloride: 105 mmol/L (ref 101–111)
Creatinine, Ser: 0.82 mg/dL (ref 0.61–1.24)
GFR calc Af Amer: >60 mL/min (ref >60)
Glucose, Bld: 102 mg/dL — ABNORMAL HIGH (ref 65–99)
POTASSIUM: 4.5 mmol/L (ref 3.5–5.1)
Sodium: 134 mmol/L — ABNORMAL LOW (ref 135–145)
TOTAL PROTEIN: 5.6 g/dL — AB (ref 6.5–8.1)

## 2016-10-05 LAB — CBC
HCT: 33.9 % — ABNORMAL LOW (ref 39.0–52.0)
HEMATOCRIT: 31.9 % — AB (ref 39.0–52.0)
HEMATOCRIT: 31 % — AB (ref 39.0–52.0)
HEMOGLOBIN: 10.3 g/dL — AB (ref 13.0–17.0)
HEMOGLOBIN: 9.9 g/dL — AB (ref 13.0–17.0)
Hemoglobin: 11.1 g/dL — ABNORMAL LOW (ref 13.0–17.0)
MCH: 30.3 pg (ref 26.0–34.0)
MCH: 30 pg (ref 26.0–34.0)
MCH: 29.8 pg (ref 26.0–34.0)
MCHC: 32.7 g/dL (ref 30.0–36.0)
MCHC: 32.3 g/dL (ref 30.0–36.0)
MCHC: 31.9 g/dL (ref 30.0–36.0)
MCV: 92.6 fL (ref 78.0–100.0)
MCV: 93 fL (ref 78.0–100.0)
MCV: 93.4 fL (ref 78.0–100.0)
PLATELETS: 48 10*3/uL — AB (ref 150–400)
Platelets: 51 10*3/uL — ABNORMAL LOW (ref 150–400)
Platelets: 47 10*3/uL — ABNORMAL LOW (ref 150–400)
RBC: 3.66 MIL/uL — AB (ref 4.22–5.81)
RBC: 3.43 MIL/uL — AB (ref 4.22–5.81)
RBC: 3.32 MIL/uL — ABNORMAL LOW (ref 4.22–5.81)
RDW: 14.6 % (ref 11.5–15.5)
RDW: 14.8 % (ref 11.5–15.5)
RDW: 14.8 % (ref 11.5–15.5)
WBC: 5.6 10*3/uL (ref 4.0–10.5)
WBC: 4.9 10*3/uL (ref 4.0–10.5)
WBC: 3.7 10*3/uL — ABNORMAL LOW (ref 4.0–10.5)

## 2016-10-05 LAB — LIPASE, BLOOD: Lipase: 31 U/L (ref 11–51)

## 2016-10-05 LAB — GLUCOSE, CAPILLARY: GLUCOSE-CAPILLARY: 77 mg/dL (ref 65–99)

## 2016-10-05 LAB — PROCALCITONIN: Procalcitonin: 3.27 ng/mL

## 2016-10-05 LAB — CORTISOL: Cortisol, Plasma: 11.7 ug/dL

## 2016-10-05 LAB — LACTIC ACID, PLASMA
Lactic Acid, Venous: 1.3 mmol/L (ref 0.5–1.9)
Lactic Acid, Venous: 1.4 mmol/L (ref 0.5–1.9)

## 2016-10-05 LAB — I-STAT CG4 LACTIC ACID, ED: Lactic Acid, Venous: 0.9 mmol/L (ref 0.5–1.9)

## 2016-10-05 LAB — AMYLASE: Amylase: 52 U/L (ref 28–100)

## 2016-10-05 LAB — MRSA PCR SCREENING: MRSA by PCR: NEGATIVE

## 2016-10-05 MED ORDER — PIPERACILLIN-TAZOBACTAM 3.375 G IVPB
3.3750 g | Freq: Three times a day (TID) | INTRAVENOUS | Status: DC
  Administered 2016-10-05 – 2016-10-06 (×3): 3.375 g via INTRAVENOUS
  Filled 2016-10-05 (×5): qty 50

## 2016-10-05 MED ORDER — SODIUM CHLORIDE 0.9 % IV SOLN
INTRAVENOUS | Status: DC
  Administered 2016-10-05 – 2016-10-08 (×4): via INTRAVENOUS

## 2016-10-05 MED ORDER — ASPIRIN 300 MG RE SUPP: 300.0000 mg | RECTAL | Status: DC

## 2016-10-05 MED ORDER — SODIUM CHLORIDE 0.9% FLUSH
3.0000 mL | Freq: Two times a day (BID) | INTRAVENOUS | Status: DC
  Administered 2016-10-05 – 2016-10-07 (×5): 3 mL via INTRAVENOUS

## 2016-10-05 MED ORDER — SODIUM CHLORIDE 0.9 % IV SOLN: 250.0000 mL | INTRAVENOUS | Status: DC | PRN

## 2016-10-05 MED ORDER — LACTULOSE 10 GM/15ML PO SOLN
20.0000 g | Freq: Every day | ORAL | Status: DC
  Administered 2016-10-05 – 2016-10-08 (×4): 20 g via ORAL
  Filled 2016-10-05 (×4): qty 30

## 2016-10-05 MED ORDER — ASPIRIN 81 MG PO CHEW: 324.0000 mg | CHEWABLE_TABLET | ORAL | Status: DC

## 2016-10-05 MED ORDER — SODIUM CHLORIDE 0.9 % IV BOLUS (SEPSIS)
1000.0000 mL | Freq: Once | INTRAVENOUS | Status: AC
  Administered 2016-10-05: 1000 mL via INTRAVENOUS

## 2016-10-05 MED ORDER — ALBUTEROL SULFATE (2.5 MG/3ML) 0.083% IN NEBU: 2.5000 mg | INHALATION_SOLUTION | Freq: Four times a day (QID) | RESPIRATORY_TRACT | Status: DC | PRN

## 2016-10-05 MED ORDER — PIPERACILLIN-TAZOBACTAM 3.375 G IVPB
3.3750 g | Freq: Three times a day (TID) | INTRAVENOUS | Status: DC
  Filled 2016-10-05 (×2): qty 50

## 2016-10-05 MED ORDER — FENTANYL CITRATE (PF) 100 MCG/2ML IJ SOLN
25.0000 ug | INTRAMUSCULAR | Status: DC | PRN
  Administered 2016-10-05 (×3): 50 ug via INTRAVENOUS
  Administered 2016-10-05: 25 ug via INTRAVENOUS
  Administered 2016-10-06 (×5): 50 ug via INTRAVENOUS
  Filled 2016-10-05 (×9): qty 2

## 2016-10-05 MED ORDER — ALBUMIN HUMAN 5 % IV SOLN
25.0000 g | Freq: Once | INTRAVENOUS | Status: DC
  Filled 2016-10-05: qty 500

## 2016-10-05 MED ORDER — VANCOMYCIN HCL 10 G IV SOLR
1500.0000 mg | Freq: Once | INTRAVENOUS | Status: AC
  Administered 2016-10-05: 1500 mg via INTRAVENOUS
  Filled 2016-10-05: qty 1500

## 2016-10-05 MED ORDER — SODIUM CHLORIDE 0.9 % IV SOLN
8.0000 mg/h | INTRAVENOUS | Status: DC
  Filled 2016-10-05 (×4): qty 80

## 2016-10-05 MED ORDER — ALBUMIN HUMAN 5 % IV SOLN
12.5000 g | Freq: Four times a day (QID) | INTRAVENOUS | Status: DC
  Filled 2016-10-05: qty 250

## 2016-10-05 MED ORDER — SODIUM CHLORIDE 0.9 % IV SOLN
80.0000 mg | Freq: Once | INTRAVENOUS | Status: AC
  Administered 2016-10-05: 80 mg via INTRAVENOUS
  Filled 2016-10-05: qty 80

## 2016-10-05 MED ORDER — MORPHINE SULFATE (PF) 2 MG/ML IV SOLN: 2.0000 mg | INTRAVENOUS | Status: DC | PRN

## 2016-10-05 MED ORDER — SODIUM CHLORIDE 0.9 % IV BOLUS (SEPSIS)
500.0000 mL | Freq: Once | INTRAVENOUS | Status: AC
  Administered 2016-10-05: 500 mL via INTRAVENOUS

## 2016-10-05 MED ORDER — VANCOMYCIN HCL IN DEXTROSE 1-5 GM/200ML-% IV SOLN
1000.0000 mg | Freq: Three times a day (TID) | INTRAVENOUS | Status: DC
  Administered 2016-10-05 – 2016-10-06 (×2): 1000 mg via INTRAVENOUS
  Filled 2016-10-05 (×5): qty 200

## 2016-10-05 MED ORDER — PANTOPRAZOLE SODIUM 40 MG PO TBEC
40.0000 mg | DELAYED_RELEASE_TABLET | Freq: Two times a day (BID) | ORAL | Status: DC
  Administered 2016-10-05 – 2016-10-07 (×5): 40 mg via ORAL
  Filled 2016-10-05 (×5): qty 1

## 2016-10-05 MED ORDER — ALBUMIN HUMAN 5 % IV SOLN
12.5000 g | Freq: Four times a day (QID) | INTRAVENOUS | Status: AC
  Administered 2016-10-05 – 2016-10-06 (×4): 12.5 g via INTRAVENOUS
  Filled 2016-10-05 (×3): qty 250

## 2016-10-05 MED ORDER — PANTOPRAZOLE SODIUM 40 MG PO TBEC: 40.0000 mg | DELAYED_RELEASE_TABLET | Freq: Two times a day (BID) | ORAL | Status: DC

## 2016-10-05 MED ORDER — SODIUM CHLORIDE 0.9 % IV SOLN
INTRAVENOUS | Status: DC
  Administered 2016-10-05: 03:00:00 via INTRAVENOUS

## 2016-10-05 MED ORDER — VANCOMYCIN HCL IN DEXTROSE 1-5 GM/200ML-% IV SOLN
1000.0000 mg | Freq: Three times a day (TID) | INTRAVENOUS | Status: DC
  Administered 2016-10-05: 1000 mg via INTRAVENOUS
  Filled 2016-10-05 (×2): qty 200

## 2016-10-05 MED ORDER — HYDROMORPHONE HCL 2 MG/ML IJ SOLN
1.0000 mg | Freq: Once | INTRAMUSCULAR | Status: AC
  Administered 2016-10-05: 1 mg via INTRAVENOUS
  Filled 2016-10-05: qty 1

## 2016-10-05 MED ORDER — ONDANSETRON HCL 4 MG/2ML IJ SOLN: 4.0000 mg | Freq: Three times a day (TID) | INTRAMUSCULAR | Status: DC | PRN

## 2016-10-05 MED ORDER — CEFTRIAXONE SODIUM 2 G IJ SOLR
2.0000 g | INTRAMUSCULAR | Status: DC
  Administered 2016-10-05: 2 g via INTRAVENOUS
  Filled 2016-10-05: qty 2

## 2016-10-05 MED ORDER — FENTANYL CITRATE (PF) 100 MCG/2ML IJ SOLN
12.5000 ug | INTRAMUSCULAR | Status: DC | PRN
  Administered 2016-10-05 (×2): 12.5 ug via INTRAVENOUS
  Filled 2016-10-05 (×2): qty 2

## 2016-10-05 MED ORDER — LACTULOSE ENEMA: 300.0000 mL | Freq: Two times a day (BID) | ORAL | Status: DC

## 2016-10-05 MED ORDER — MORPHINE SULFATE (PF) 4 MG/ML IV SOLN
2.0000 mg | INTRAVENOUS | Status: DC | PRN
  Administered 2016-10-05 (×2): 2 mg via INTRAVENOUS
  Filled 2016-10-05 (×2): qty 1

## 2016-10-05 NOTE — ED Notes (Signed)
Patient to receive zosyn and vanc

## 2016-10-05 NOTE — ED Notes (Signed)
Spoke with Dr Clyde LundborgNiu regarding floor nurses concerns with low blood pressure.  NS bolus started per order.  Told not to take patient to the floor.

## 2016-10-05 NOTE — ED Notes (Signed)
MD at bedside. 

## 2016-10-05 NOTE — ED Provider Notes (Signed)
Angiocath insertion Performed by: Shon BatonHORTON, Kemarion Abbey F  Consent: Verbal consent obtained. Risks and benefits: risks, benefits and alternatives were discussed Time out: Immediately prior to procedure a "time out" was called to verify the correct patient, procedure, equipment, support staff and site/side marked as required.  Preparation: Patient was prepped and draped in the usual sterile fashion.  Vein Location: left basilic  Ultrasound Guided  Gauge: 20  Normal blood return and flush without difficulty Patient tolerance: Patient tolerated the procedure well with no immediate complications.      Shon Batonourtney F Dallon Dacosta, MD 10/05/16 [phone]

## 2016-10-05 NOTE — Consult Note (Signed)
[facility] Consult: 8:23 AM 10/05/2016  LOS: 0 days    Referring Provider: Dr Quincy Simmonds  Primary Care Physician:  Imagene Riches, NP Primary Gastroenterologist:  Althia Forts.     Reason for Consultation:  Hematemesis   HPI: Dan [name] is a 45 y.o. male.  PMH chronic pain, polysubstance/IV drug abuse. 2012 lung abscess, empyema status post thoracotomy.  Cirrhosis diagnosis made based on CT scan of 81/17 which showed cirrhosis, splenomegaly, portal hypertension, probable esophageal varices, large volume ascites. Also showed gastric edema and edema in the distal esophagus.  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 Thrombocytopenia (low of 55 K in 07/2016).  EGD 07/16/16 for hematemesis, Dr Havery Moros: Large proximal stomach ulceration, portal hypertensive gastropathy. No esophageal varices. Gastric biopsy pathology showed unremarkable gastric type mucosa. No H. pylori, dysplasia or malignancy. He did not require blood transfusion during that admission as hemoglobin ranged between 12.3 and 13.4. AFP 3.2.    Ran out of Protonix after early 08/2016.   Never followed up with GI (missed appt) or with ID.   Returned to ED 10/13.  C/o bloating, extremity swelling.  Blackish red emesis (he attributed to eating watermellon).  Hgb 13.7. Platelets 79 K.  Paracentesis, 5 liters 09/27/16, 1.3 liters 09/30/16.  No evidence SBP (353 WBCs, 15 neutrophils).   Discharged 10/19.  RXd meds: Lasix 40, Aldactone 100, Protonix 40 BID, Carafate qid. However he had arrived available and left the hospital before he was able to get paperwork, thus he never got prescriptions. Hgb 11 at discharge.    Given GI office and ID office #s and advised to make appt.  Again he didn't get any of the paperwork at his discharge  Back in ED 10/22 with 10/10 diffuse abd pain.  Taking up to 1200 mg Advil for the past couple of days, this provides minimal relief. On 10/21 he had a couple of episodes of n/v and dark/bloody emesis.  The nausea resolved and he hasn't vomited since. A bowel movement on 10/22 was performed and brown. He is currently quite hungry.  Sepsis picture with fever to 103.9. elevated lactate, hypoxia (suspect abd distention limiting lung excursion). Hypotension responding to fluids. Got Albumin, Rocephin Hgb 11.1.  Platelets 48.  coags 16.7/1.3.  WBCs are not elevated. Alk phos 189, was to 135 at recent admit.  AST/ALT 81/50, was 69/79.  T bili not elevated.   CT abd/pelvis: cirrhosis, splenomegaly, small to moderate ascites (overall decreased). Distal esophageal thickening.  Decrease in GB distention.   CXR unremarkable.     Past Medical History:  Diagnosis Date  . Cirrhosis of liver ([facility])   . Gastric ulceration   . Hepatitis B   . Hepatitis C   . Polysubstance abuse   . Upper GI bleed     Past Surgical History:  Procedure Laterality Date  . ESOPHAGOGASTRODUODENOSCOPY N/A 07/16/2016   Procedure: ESOPHAGOGASTRODUODENOSCOPY (EGD);  Surgeon: Manus Gunning, MD;  Location:  [facility];  Service: Gastroenterology;  Laterality: N/A;  . LUNG SURGERY      Prior to Admission medications   Medication Sig Start Date End Date Taking? Authorizing Provider  furosemide (LASIX) 40 MG tablet Take 1 tablet (40 mg total) by mouth daily. Patient not taking: Reported on 10/04/2016 10/01/16   Jonetta Osgood, MD  lactulose (CHRONULAC) 10 GM/15ML solution Take 30 mLs (20 g total) by mouth daily. Patient not taking: Reported on 10/04/2016 10/01/16   Jonetta Osgood, MD  ondansetron (ZOFRAN) 4 MG tablet Take 1 tablet (4 mg total) by mouth every 8 (eight) hours as needed for nausea or vomiting. Patient not taking: Reported on 10/04/2016 10/01/16    Jonetta Osgood, MD  oxyCODONE (ROXICODONE) 5 MG immediate release tablet Take 1 tablet (5 mg total) by mouth every 6 (six) hours as needed for severe pain. Patient not taking: Reported on 10/04/2016 07/17/16   Ophelia Shoulder, MD  pantoprazole (PROTONIX) 40 MG tablet Take 1 tablet (40 mg total) by mouth 2 (two) times daily. Patient not taking: Reported on 10/04/2016 10/01/16   Jonetta Osgood, MD  spironolactone (ALDACTONE) 100 MG tablet Take 1 tablet (100 mg total) by mouth daily. Patient not taking: Reported on 10/04/2016 10/01/16   Jonetta Osgood, MD  sucralfate (CARAFATE) 1 GM/10ML suspension Take 10 mLs (1 g total) by mouth every 6 (six) hours. Patient not taking: Reported on 10/04/2016 10/01/16   Jonetta Osgood, MD    Scheduled Meds: . lactulose  20 g Oral Daily  . sodium chloride flush  3 mL Intravenous Q12H   Infusions: . sodium chloride 125 mL/hr at 10/05/16 0653  . albumin human    . pantoprozole (PROTONIX) infusion 8 mg/hr (10/05/16 0417)   PRN Meds: albuterol, morphine injection, ondansetron (ZOFRAN) IV   Allergies as of 10/04/2016  . (No Known Allergies)    Family History  Problem Relation Age of Onset  . Dementia Mother     Social History   Social History  . Marital status: Single    Spouse name: N/A  . Number of children: N/A  . Years of education: N/A   Occupational History  . Not on file.   Social History Main Topics  . Smoking status: Current Every Day Smoker    Packs/day: 0.50    Types: Cigarettes  . Smokeless tobacco: Never Used  . Alcohol use No  . Drug use: No  . Sexual activity: Not on file   Other Topics Concern  . Not on file   Social History Narrative  . No narrative on file    REVIEW OF SYSTEMS: Constitutional:  No profound weakness or dizziness. Some malaise. ENT:  No nose bleeds Pulm:  No shortness of breath. No cough. CV:  No palpitations, no LE edema.  GU:  No hematuria, no frequency GI:  Per HPI Heme:  Per HPI     Transfusions:  none Neuro:  No headaches, no peripheral tingling or numbness Derm:  No itching, no rash or sores.  Endocrine:  No sweats or chills.  No polyuria or dysuria Immunization:  Received influenza vaccination 10/01/2016 Travel:  None beyond local counties in last few months.    PHYSICAL EXAM: Vital signs in last 24 hours: Vitals:   10/05/16 0800 10/05/16 0815  BP: (!) 85/54 (!) 88/66  Pulse: 81 (!) 56  Resp: 14 10  Temp:     Wt Readings from Last 3 Encounters:  10/04/16 72.1 kg (159 lb)  09/30/16 72.4 kg (159 lb 9.6 oz)  07/14/16 74.3 kg (163 lb 12.8 oz)    General: Looks unwell but not toxic. Comfortable. Head:  no facial swelling or asymmetry. No signs of head trauma.  Eyes:  No scleral icterus. No conjunctival pallor. Ears:  Not HOH  Nose:  No discharge Mouth:  Moist, clear oral mucosa. Poor dentition. Neck:  No mass, no JVD. Lungs:  Clear to auscultation bil.  No dyspnea. No cough. Heart: RRR. No MRG. S1, S2 present. Abdomen:  Soft. Not tender. Bowel sounds hypoactive. No tinkling or tympanitic bowel sounds. No masses. Not able to appreciate HSM.Marland Kitchen   Rectal: Deferred   Musc/Skeltl: No joint erythema, deformities or swelling. Extremities:  No CCE.  Neurologic:  Alert. Oriented 3. No tremor or asterixis. Skin:  No rash. No telangiectasias. Tattoos:  On back. Nodes:  No cervical adenopathy   Psych:  Cooperative, pleasant, calm.  Intake/Output from previous day: 10/22 0701 - 10/23 0700 In: 4300 [IV Piggyback:4300] Out: 100 [Urine:100] Intake/Output this shift: Total I/O In: 500 [IV Piggyback:500] Out: -   LAB RESULTS:  Recent Labs  10/04/16 2100 10/05/16 0318  WBC 3.7* 5.6  HGB 11.5* 11.1*  HCT 35.2* 33.9*  PLT 62* 48*   BMET Lab Results  Component Value Date   NA 134 (L) 10/05/2016   NA 132 (L) 10/04/2016   NA 135 09/30/2016   K 4.5 10/05/2016   K 5.0 10/04/2016   K 4.6 09/30/2016   CL 105 10/05/2016   CL 99 (L) 10/04/2016   CL 103  09/30/2016   CO2 24 10/05/2016   CO2 24 10/04/2016   CO2 22 09/30/2016   GLUCOSE 102 (H) 10/05/2016   GLUCOSE 115 (H) 10/04/2016   GLUCOSE 103 (H) 09/30/2016   BUN 14 10/05/2016   BUN 18 10/04/2016   BUN 11 09/30/2016   CREATININE 0.82 10/05/2016   CREATININE 0.83 10/04/2016   CREATININE 0.93 09/30/2016   CALCIUM 7.3 (L) 10/05/2016   CALCIUM 8.2 (L) 10/04/2016   CALCIUM 8.9 09/30/2016   LFT  Recent Labs  10/04/16 2100 10/05/16 0318  PROT 6.7 5.6*  ALBUMIN 2.5* 2.1*  AST 81* 59*  ALT 50 41  ALKPHOS 189* 153*  BILITOT 1.0 0.7   PT/INR Lab Results  Component Value Date   INR 1.35 10/04/2016   INR 1.28 09/25/2016   INR 1.58 07/14/2016   Hepatitis Panel No results for input(s): HEPBSAG, HCVAB, HEPAIGM, HEPBIGM in the last 72 hours. C-Diff No components found for: CDIFF Lipase     Component Value Date/Time   LIPASE 35 10/04/2016 2100    Drugs of Abuse     Component Value Date/Time   LABOPIA NONE DETECTED 09/25/2016 1152   COCAINSCRNUR NONE DETECTED 09/25/2016 1152   LABBENZ POSITIVE (A) 09/25/2016 1152   AMPHETMU NONE DETECTED 09/25/2016 1152   THCU NONE DETECTED 09/25/2016 1152   LABBARB NONE DETECTED 09/25/2016 1152     RADIOLOGY STUDIES: Ct Abdomen Pelvis W Contrast  Result Date: 10/05/2016 CLINICAL DATA:  Abdominal pain and swelling, vomiting blood recent paracentesis EXAM: CT ABDOMEN AND PELVIS WITH CONTRAST TECHNIQUE: Multidetector CT imaging of the abdomen and pelvis was performed using the standard protocol following bolus administration of intravenous contrast. CONTRAST:  15m ISOVUE-300 IOPAMIDOL (ISOVUE-300) INJECTION 61% COMPARISON:  09/30/2016, 07/14/2016 FINDINGS: Lower chest: Patchy dependent atelectasis. Nodular atelectasis or scarring in the posterior right lung base. No effusion. Heart size normal. No large pericardial effusion. Hepatobiliary: Nodular contour of the liver consistent  with cirrhosis. Heterogenous liver enhancement but without  discrete mass. Portal veins and splenic vein appear patent. The gallbladder is dilated but decreased compared to prior. No intra or extrahepatic biliary dilatation. Pancreas: Unremarkable. No pancreatic ductal dilatation or surrounding inflammatory changes. Spleen: Markedly enlarged at 20 cm. Adrenals/Urinary Tract: Adrenal glands are within normal limits. Small calcified stone in the mid to upper right kidney. No hydronephrosis. Bladder is normal. Stomach/Bowel: The stomach is collapsed. There is distal esophageal thickening. No dilated large or small bowel. Probable esophageal varices. Vascular/Lymphatic: Aortic caliber is normal. There is atherosclerosis present. No grossly enlarged lymph nodes. Reproductive: Prostate gland slightly enlarged with few calcifications. Other: No free air. Small to moderate ascites, decreased compared to comparison exam. Musculoskeletal: No acute osseous abnormality. Degenerative changes of the spine with gas within the disc space and posterior to the L4 vertebral body. IMPRESSION: 1. Nodular liver contour or consistent with cirrhosis. Findings consistent with portal hypertension including marked splenomegaly and probable esophageal varices. 2. Small to moderate volume of ascites, decreased compared to prior. 3. Distal esophageal thickening could relate to esophagitis. 4. Dilated gallbladder but decreased compared to prior. No intra or extrahepatic biliary dilatation. 5. Nonobstructing stone in the right kidney. Electronically Signed   By: Donavan Foil M.D.   On: 10/05/2016 01:51   Dg Chest Port 1 View  Result Date: 10/04/2016 CLINICAL DATA:  Sepsis. EXAM: PORTABLE CHEST 1 VIEW COMPARISON:  09/25/2016 FINDINGS: Cardiomediastinal silhouette is within normal limits. Diffuse interstitial coarsening is unchanged and compatible with chronic lung disease. There is no evidence of acute airspace consolidation, overt edema, pleural effusion, or pneumothorax. No acute osseous abnormality  is seen. IMPRESSION: No active disease. Electronically Signed   By: Logan Bores M.D.   On: 10/04/2016 22:25     IMPRESSION:   *  GI bleed.  Hematemesis 2 days ago, resolved. Hemoglobin is stable. 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 Suspect bleeding and problems are due to ongoing ulcer since he has not been compliant with PPI therapy other than during the recent hospital stay 1+ week ago.  *  Ascites.  *  Generalized abdominal pain.  *  Cirrhosis, hep C and hep B positive. Has never followed up with infectious disease.    PLAN:     *  Allow clear liquid diet.   *  Needs to obtain his meds and take them as RXd.   *  Given that he is poorly compliant, question should he undergo upper endoscopy to assess the ulcer while he is an inpatient, rather than deferring this to the outpatient setting where he is most likely will not follow up.   *  I am going ahead and stopping the IV Protonix drip. Going to replace this with oral Protonix twice daily.   Azucena Freed  10/05/2016, 8:23 AM Pager: [phone]  I have reviewed the entire case in detail with the above APP and discussed the plan in detail.  Therefore, I agree with the diagnoses recorded above. In addition,  I have personally interviewed and examined the patient and have personally reviewed any abdominal/pelvic CT scan images.  My additional thoughts are as follows:   SBP with sepsis, hypotension.  There is a significant amount of ascites on CT scan - I reviewed the images. Cirrhosis as above Viral hepatitis Very large Gastric ulcer seen over 2 months ago.  Noncompliant with meds and follow up. Recent small volume hematemesis.   Treat with Abx coverage for SBP.  Eventual EGD prior to discharge to re-assess GU.  Agree with oral PPI now.  I have ordered a diet.  Nelida Meuse III Pager [phone]  Mon-Fri 8a-5p [phone] after 5p, weekends, holidays

## 2016-10-05 NOTE — H&P (Signed)
History and Physical    Dan [name] RUE:[id number] DOB: August 21, 1971 DOA: 10/04/2016  Referring MD/NP/PA:   PCP: [name], NP   Patient coming from:  The patient is coming from home.  At baseline, pt is independent for most of ADL.  Chief Complaint: Nausea, vomiting, abdominal pain, hematemesis, rectal bleeding  HPI: Dan [name] is a 45 y.o. male with medical history significant of liver cirrhosis, hepatitis C, hepatitis B, polysubstance abuse, hernia, GI bleeding, tobacco abuse, GERD, chronic thrombocytopenia, who presents with nausea, vomiting, abdominal pain, hematemesis, rectal bleeding.  Patient states that he has been having nausea, vomiting, abdominal pain for 2 days. He vomited twice with dark blood in the vomitus. He also has rectal bleeding with melena. His abdominal pain is diffuse, 10 out of 10 in severity, nonradiating. It is not aggravated or alleviated by any known factors. No diarrhea. He also has chills and a fever of 102 at home. Of note, patient was hospitalized from 10/13-10/19-2 abdominal pain, was found to have no SBP. Pt has oxygen desaturation to 87% on RA in ED, but denies chest pain, shortness of breath, cough. No symptoms of UTI or unilateral weakness.  ED Course: pt was found to have WBC 3.7, hemoglobin 11.5 which was 11 on 09/28/16, platelet 62 which was 68 on 09/28/69, lactate 2.5-->3.13-->0.9, INR 1.35, PTT 57, negative troponin, lipase 35, negative urinalysis, potassium normal, creatinine normal, temperature 103.9, hypotension with blood pressure down to 83/51, which responded to IV fluid and improved to 96/57. Chest x-ray negative. Patient is admitted to stepdown bed as inpatient.  # CT-abdomen/pelvis showed: cirrhosis, portal hypertension including marked splenomegaly, probable esophageal varices, small to moderate volume of ascites, possible esophagitis. dilated gallbladder but decreased compared to prior. No intra or extrahepatic biliary dilatation,  nonobstructing stone in the right kidney.  Review of Systems:   General: has fevers, chills, no changes in body weight, has poor appetite, has fatigue HEENT: no blurry vision, hearing changes or sore throat Respiratory: no dyspnea, coughing, wheezing CV: no chest pain, no palpitations GI: has nausea, vomiting, abdominal pain, no diarrhea, constipation. Has hematemesis and hematochezia  GU: no dysuria, burning on urination, increased urinary frequency, hematuria  Ext: no leg edema Neuro: no unilateral weakness, numbness, or tingling, no vision change or hearing loss Skin: no rash, no skin tear. MSK: No muscle spasm, no deformity, no limitation of range of movement in spin Heme: No easy bruising.  Travel history: No recent long distant travel.  Allergy: No Known Allergies  Past Medical History:  Diagnosis Date  . Cirrhosis of liver (HCC)   . Gastric ulceration   . Hepatitis B   . Hepatitis C   . Polysubstance abuse   . Upper GI bleed     Past Surgical History:  Procedure Laterality Date  . ESOPHAGOGASTRODUODENOSCOPY N/A 07/16/2016   Procedure: ESOPHAGOGASTRODUODENOSCOPY (EGD);  Surgeon: Ruffin Frederick, MD;  Location: [facility] ENDOSCOPY;  Service: Gastroenterology;  Laterality: N/A;  . LUNG SURGERY      Social History:  reports that he has been smoking Cigarettes.  He has been smoking about 0.50 packs per day. He has never used smokeless tobacco. He reports that he does not drink alcohol or use drugs.  Family History: Father had intracranial hemorrhage Family History  Problem Relation Age of Onset  . Dementia Mother      Prior to Admission medications   Medication Sig Start Date End Date Taking? Authorizing Provider  furosemide (LASIX) 40 MG tablet Take 1  tablet (40 mg total) by mouth daily. Patient not taking: Reported on 10/04/2016 10/01/16   Maretta Bees, MD  lactulose (CHRONULAC) 10 GM/15ML solution Take 30 mLs (20 g total) by mouth daily. Patient not taking:  Reported on 10/04/2016 10/01/16   Maretta Bees, MD  ondansetron (ZOFRAN) 4 MG tablet Take 1 tablet (4 mg total) by mouth every 8 (eight) hours as needed for nausea or vomiting. Patient not taking: Reported on 10/04/2016 10/01/16   Maretta Bees, MD  oxyCODONE (ROXICODONE) 5 MG immediate release tablet Take 1 tablet (5 mg total) by mouth every 6 (six) hours as needed for severe pain. Patient not taking: Reported on 10/04/2016 07/17/16   Thomasene Lot, MD  pantoprazole (PROTONIX) 40 MG tablet Take 1 tablet (40 mg total) by mouth 2 (two) times daily. Patient not taking: Reported on 10/04/2016 10/01/16   Maretta Bees, MD  spironolactone (ALDACTONE) 100 MG tablet Take 1 tablet (100 mg total) by mouth daily. Patient not taking: Reported on 10/04/2016 10/01/16   Maretta Bees, MD  sucralfate (CARAFATE) 1 GM/10ML suspension Take 10 mLs (1 g total) by mouth every 6 (six) hours. Patient not taking: Reported on 10/04/2016 10/01/16   Maretta Bees, MD    Physical Exam: Vitals:   10/05/16 0415 10/05/16 0422 10/05/16 0430 10/05/16 0500  BP: (!) 77/50 (!) 93/52 92/58 111/63  Pulse: (!) 42 (!) 34 (!) 54 68  Resp: 16 17 12 25   Temp:      TempSrc:      SpO2: 95% 100% 95% 98%  Weight:      Height:       General: Not in acute distress HEENT:       Eyes: PERRL, EOMI, no scleral icterus.       ENT: No discharge from the ears and nose, no pharynx injection, no tonsillar enlargement.        Neck: No JVD, no bruit, no mass felt. Heme: No neck lymph node enlargement. Cardiac: S1/S2, RRR, No murmurs, No gallops or rubs. Respiratory: No rales, wheezing, rhonchi or rubs. GI: Distended, diffusely tender, no rebound pain, BS present. GU: No hematuria Ext: No pitting leg edema bilaterally. 2+DP/PT pulse bilaterally. Musculoskeletal: No joint deformities, No joint redness or warmth, no limitation of ROM in spin. Skin: No rashes.  Neuro: Alert, oriented X3, cranial nerves II-XII grossly  intact, moves all extremities normally.   Psych: Patient is not psychotic, no suicidal or hemocidal ideation.  Labs on Admission: I have personally reviewed following labs and imaging studies  CBC:  Recent Labs Lab 10/04/16 2100 10/05/16 0318  WBC 3.7* 5.6  NEUTROABS 3.2  --   HGB 11.5* 11.1*  HCT 35.2* 33.9*  MCV 91.0 92.6  PLT 62* 48*   Basic Metabolic Panel:  Recent Labs Lab 09/29/16 0523 09/30/16 0522 10/04/16 2100 10/05/16 0318  NA 136 135 132* 134*  K 4.4 4.6 5.0 4.5  CL 101 103 99* 105  CO2 30 22 24 24   GLUCOSE 121* 103* 115* 102*  BUN 13 11 18 14   CREATININE 0.74 0.93 0.83 0.82  CALCIUM 7.9* 8.9 8.2* 7.3*   GFR: Estimated Creatinine Clearance: 113.6 mL/min (by C-G formula based on SCr of 0.82 mg/dL). Liver Function Tests:  Recent Labs Lab 10/04/16 2100 10/05/16 0318  AST 81* 59*  ALT 50 41  ALKPHOS 189* 153*  BILITOT 1.0 0.7  PROT 6.7 5.6*  ALBUMIN 2.5* 2.1*    Recent Labs Lab 10/04/16 2100  LIPASE 35   No results for input(s): AMMONIA in the last 168 hours. Coagulation Profile:  Recent Labs Lab 10/04/16 2100  INR 1.35   Cardiac Enzymes:  Recent Labs Lab 10/04/16 2100  CKTOTAL 34*  TROPONINI <0.03   BNP (last 3 results) No results for input(s): PROBNP in the last 8760 hours. HbA1C: No results for input(s): HGBA1C in the last 72 hours. CBG: No results for input(s): GLUCAP in the last 168 hours. Lipid Profile: No results for input(s): CHOL, HDL, LDLCALC, TRIG, CHOLHDL, LDLDIRECT in the last 72 hours. Thyroid Function Tests: No results for input(s): TSH, T4TOTAL, FREET4, T3FREE, THYROIDAB in the last 72 hours. Anemia Panel: No results for input(s): VITAMINB12, FOLATE, FERRITIN, TIBC, IRON, RETICCTPCT in the last 72 hours. Urine analysis:    Component Value Date/Time   COLORURINE YELLOW 10/04/2016 2159   APPEARANCEUR CLEAR 10/04/2016 2159   LABSPEC 1.020 10/04/2016 2159   PHURINE 6.0 10/04/2016 2159   GLUCOSEU NEGATIVE  10/04/2016 2159   HGBUR NEGATIVE 10/04/2016 2159   BILIRUBINUR NEGATIVE 10/04/2016 2159   KETONESUR NEGATIVE 10/04/2016 2159   PROTEINUR NEGATIVE 10/04/2016 2159   NITRITE NEGATIVE 10/04/2016 2159   LEUKOCYTESUR NEGATIVE 10/04/2016 2159   Sepsis Labs: @LABRCNTIP (procalcitonin:4,lacticidven:4) ) Recent Results (from the past 240 hour(s))  Blood culture (routine x 2)     Status: None   Collection Time: 09/25/16 12:20 PM  Result Value Ref Range Status   Specimen Description BLOOD LEFT ANTECUBITAL  Final   Special Requests BOTTLES DRAWN AEROBIC ONLY 6CC  Final   Culture NO GROWTH 5 DAYS  Final   Report Status 09/30/2016 FINAL  Final  Blood culture (routine x 2)     Status: None   Collection Time: 09/25/16 12:25 PM  Result Value Ref Range Status   Specimen Description BLOOD LEFT HAND  Final   Special Requests IN PEDIATRIC BOTTLE 4CC  Final   Culture NO GROWTH 5 DAYS  Final   Report Status 09/30/2016 FINAL  Final  Culture, body fluid-bottle     Status: None   Collection Time: 09/25/16  3:05 PM  Result Value Ref Range Status   Specimen Description PERITONEAL  Final   Special Requests NONE  Final   Culture NO GROWTH 5 DAYS  Final   Report Status 09/30/2016 FINAL  Final  Gram stain     Status: None   Collection Time: 09/25/16  3:05 PM  Result Value Ref Range Status   Specimen Description PERITONEAL  Final   Special Requests NONE  Final   Gram Stain   Final    FEW WBC PRESENT,BOTH PMN AND MONONUCLEAR NO ORGANISMS SEEN    Report Status 09/26/2016 FINAL  Final  Gram stain     Status: None   Collection Time: 09/30/16 11:10 AM  Result Value Ref Range Status   Specimen Description PARACENTESIS  Final   Special Requests NONE  Final   Gram Stain   Final    FEW WBC PRESENT,BOTH PMN AND MONONUCLEAR NO ORGANISMS SEEN    Report Status 09/30/2016 FINAL  Final     Radiological Exams on Admission: Ct Abdomen Pelvis W Contrast  Result Date: 10/05/2016 CLINICAL DATA:  Abdominal pain  and swelling, vomiting blood recent paracentesis EXAM: CT ABDOMEN AND PELVIS WITH CONTRAST TECHNIQUE: Multidetector CT imaging of the abdomen and pelvis was performed using the standard protocol following bolus administration of intravenous contrast. CONTRAST:  100mL ISOVUE-300 IOPAMIDOL (ISOVUE-300) INJECTION 61% COMPARISON:  09/30/2016, 07/14/2016 FINDINGS: Lower chest: Patchy dependent atelectasis.  Nodular atelectasis or scarring in the posterior right lung base. No effusion. Heart size normal. No large pericardial effusion. Hepatobiliary: Nodular contour of the liver consistent with cirrhosis. Heterogenous liver enhancement but without discrete mass. Portal veins and splenic vein appear patent. The gallbladder is dilated but decreased compared to prior. No intra or extrahepatic biliary dilatation. Pancreas: Unremarkable. No pancreatic ductal dilatation or surrounding inflammatory changes. Spleen: Markedly enlarged at 20 cm. Adrenals/Urinary Tract: Adrenal glands are within normal limits. Small calcified stone in the mid to upper right kidney. No hydronephrosis. Bladder is normal. Stomach/Bowel: The stomach is collapsed. There is distal esophageal thickening. No dilated large or small bowel. Probable esophageal varices. Vascular/Lymphatic: Aortic caliber is normal. There is atherosclerosis present. No grossly enlarged lymph nodes. Reproductive: Prostate gland slightly enlarged with few calcifications. Other: No free air. Small to moderate ascites, decreased compared to comparison exam. Musculoskeletal: No acute osseous abnormality. Degenerative changes of the spine with gas within the disc space and posterior to the L4 vertebral body. IMPRESSION: 1. Nodular liver contour or consistent with cirrhosis. Findings consistent with portal hypertension including marked splenomegaly and probable esophageal varices. 2. Small to moderate volume of ascites, decreased compared to prior. 3. Distal esophageal thickening could  relate to esophagitis. 4. Dilated gallbladder but decreased compared to prior. No intra or extrahepatic biliary dilatation. 5. Nonobstructing stone in the right kidney. Electronically Signed   By: Jasmine Pang M.D.   On: 10/05/2016 01:51   Dg Chest Port 1 View  Result Date: 10/04/2016 CLINICAL DATA:  Sepsis. EXAM: PORTABLE CHEST 1 VIEW COMPARISON:  09/25/2016 FINDINGS: Cardiomediastinal silhouette is within normal limits. Diffuse interstitial coarsening is unchanged and compatible with chronic lung disease. There is no evidence of acute airspace consolidation, overt edema, pleural effusion, or pneumothorax. No acute osseous abnormality is seen. IMPRESSION: No active disease. Electronically Signed   By: Sebastian Ache M.D.   On: 10/04/2016 22:25     EKG: Independently reviewed.  Not done in ED, will get one.   Assessment/Plan Principal Problem:   Abdominal pain Active Problems:   Other cirrhosis of liver (HCC)   Hematemesis   SBP (spontaneous bacterial peritonitis) (HCC)   Ascites   Nausea & vomiting   Sepsis (HCC)   GIB (gastrointestinal bleeding)   Acute respiratory failure with hypoxia (HCC)   Abdominal pain and SBP and sepsis: Patient's nausea, vomiting and AP are most likely due to SBP. Patient is septic on admission, with elevated lactic acid, hypotension, fever and WBC 6.7. Blood pressure temporarily responded to IV fluids. So far 4L of normal saline was given, blood pressure is still soft. Patient's mental status is normal. Lactic acid is normalized.  -Will admit to SDU as inpt -PCCM, dr. Isaiah Serge was consulted. -IV rocephin (pt received 1 dose of vancomycin and Zosyn in ED) -will get Procalcitonin and trend lactic acid levels per sepsis protocol. -IVF: 4L of NS bolus in ED, followed by 125 cc/h  -give 25 g of albumin per PCCM -f/u Bx and Ux. -Appearing Zofran for nausea and morphine for pain -please call IR for paracentesis.  GIB: pt report hematemesis and hematochezia.  Hemoglobin stable. Patient is high risk of bleeder due to history of cirrhosis - NPO  - NS as above - Start IV pantoprazole gtt - Avoid NSAIDs and SQ heparin - Maintain IV access (2 large bore IVs if possible). - Monitor closely and follow q6h cbc, transfuse as necessary - FOBT -please call GI in AM  Cirrhosis:  Mental status  normal. No signs of hepatic encephalopathy currently. -Hold Lasix and spironolactone due to sepsis -Continue lactulose   Acute respiratory failure with hypoxia [facility]): Patient oxygen saturation down to 87% on room air. Patient does not have cough, shortness of breath, chest pain. Chest x-ray negative. Likely due to abdominal distention, pushing up and the limited lung expansion. -Nasal cannula oxygen -prn albuterol nebulizer    ED Sepsis - Assessment   Performed at:    5:32 AM   Last Vitals:    Blood pressure (!) 93/52, pulse (!) 34, temperature 98.7 F (37.1 C), temperature source Oral, resp. rate 17, height 5\' 11"  (1.803 m), weight 72.1 kg (159 lb), SpO2 100 %.  Heart:                  Regular rhythm   Lungs:     Clear to auscultation  Capillary Refill:   <2 seconds    Peripheral Pulse (include location): Brachial pulse   Skin (include color):              Pale      DVT ppx: SCD Code Status: Full code Family Communication: None at bed side.  Disposition Plan:  Anticipate discharge back to previous home environment Consults called:  PCCM, Dr. Isaiah Serge Admission status: SDU/inpation       Date of Service 10/05/2016    Lorretta Harp [facility] Pager [phone]  If 7PM-7AM, please contact night-coverage [email or web address] Password [facility] 10/05/2016, 5:32 AM

## 2016-10-05 NOTE — Progress Notes (Signed)
Pharmacy Antibiotic Note  Dan [name] is a 45 y.o. male admitted on 10/04/2016 with  c/o abd pain, N/V with black and bright red emesis x 2 days and fever Tm 102F. Initially started on zosyn but this was changed to ceftriaxone. Pt was loaded with vancomycin earlier this morning. Now restarting zosyn and continuing vancomycin.   Plan: - Vancomycin 1gm IV Q8H - Zosyn 3.375gm IV Q8H (4 hr inf) - F/u renal fxn, C&S, clinical status and trough at SS  Height: 5\' 11"  (180.3 cm) Weight: 159 lb (72.1 kg) IBW/kg (Calculated) : 75.3  Temp (24hrs), Avg:101.3 F (38.5 C), Min:98.7 F (37.1 C), Max:103.9 F (39.9 C)   Recent Labs Lab 09/29/16 0523 09/30/16 0522 10/04/16 2055 10/04/16 2100 10/04/16 2119 10/05/16 0010 10/05/16 0318 10/05/16 0630  WBC  --   --   --  3.7*  --   --  5.6  --   CREATININE 0.74 0.93  --  0.83  --   --  0.82  --   LATICACIDVEN  --   --  2.5*  --  3.13* 0.90 1.3 1.4    Estimated Creatinine Clearance: 113.6 mL/min (by C-G formula based on SCr of 0.82 mg/dL).    No Known Allergies  Antimicrobials this admission: Vanc 10/23>> Zosyn 10/23>> CTX x 1 10/23  Dose adjustments this admission: N/A  Microbiology results: Pending  Lysle Pearlachel Kristeena Meineke, PharmD, BCPS 10/05/2016 8:30 AM

## 2016-10-05 NOTE — ED Notes (Signed)
Admitting MD at bedside.

## 2016-10-05 NOTE — Consult Note (Signed)
PULMONARY / CRITICAL CARE MEDICINE   Name: Dan [name] MRN: [id number] DOB: 03/25/1971    ADMISSION DATE:  10/04/2016 CONSULTATION DATE: 10/04/2016  REFERRING MD:  Clyde Lundborg  CHIEF COMPLAINT:  Abdominal pain  HISTORY OF PRESENT ILLNESS:   Pt is a 45 yo male with history of liver cirrhosis who presented to the ED with complaints of abdominal pain for the past 2 days. He rates the pain 10/10. He denies fever or chills at present but states he did feel febrile at home. He has had n/v with hematemesis. He also states he has noticed blood in his stool. Pt was recently admitted 10/13-10/19 where he received paracenteses on 10/15 and 10/18. At that time, there was no evidence of SBP. In the ED, Code Sepsis was called. Temp 103.9 and lactate initially 2.5 with abdominal tenderness concerning for SBP. Pt was normotensive on arrival but pressure began to drop in the ED with lowest being 78/51, and has since received 5 boluses NS with last BP checked to be 91/61. CCM was consulted at that time. EKG showed sinus tachycardia with no acute ischemic changes.  PAST MEDICAL HISTORY :  He  has a past medical history of Cirrhosis of liver (HCC); Gastric ulceration; Hepatitis B; Hepatitis C; Polysubstance abuse; and Upper GI bleed.  PAST SURGICAL HISTORY: He  has a past surgical history that includes Esophagogastroduodenoscopy (N/A, 07/16/2016) and Lung surgery.  No Known Allergies  No current facility-administered medications on file prior to encounter.    Current Outpatient Prescriptions on File Prior to Encounter  Medication Sig  . furosemide (LASIX) 40 MG tablet Take 1 tablet (40 mg total) by mouth daily. (Patient not taking: Reported on 10/04/2016)  . lactulose (CHRONULAC) 10 GM/15ML solution Take 30 mLs (20 g total) by mouth daily. (Patient not taking: Reported on 10/04/2016)  . ondansetron (ZOFRAN) 4 MG tablet Take 1 tablet (4 mg total) by mouth every 8 (eight) hours as needed for nausea or vomiting.  (Patient not taking: Reported on 10/04/2016)  . oxyCODONE (ROXICODONE) 5 MG immediate release tablet Take 1 tablet (5 mg total) by mouth every 6 (six) hours as needed for severe pain. (Patient not taking: Reported on 10/04/2016)  . pantoprazole (PROTONIX) 40 MG tablet Take 1 tablet (40 mg total) by mouth 2 (two) times daily. (Patient not taking: Reported on 10/04/2016)  . spironolactone (ALDACTONE) 100 MG tablet Take 1 tablet (100 mg total) by mouth daily. (Patient not taking: Reported on 10/04/2016)  . sucralfate (CARAFATE) 1 GM/10ML suspension Take 10 mLs (1 g total) by mouth every 6 (six) hours. (Patient not taking: Reported on 10/04/2016)    FAMILY HISTORY:  His indicated that his mother is alive.    SOCIAL HISTORY: He  reports that he has been smoking Cigarettes.  He has been smoking about 0.50 packs per day. He has never used smokeless tobacco. He reports that he does not drink alcohol or use drugs.  REVIEW OF SYSTEMS:   Negative otherwise stated in HPI  SUBJECTIVE:   VITAL SIGNS: BP 91/61   Pulse (!) 52   Temp 98.7 F (37.1 C) (Oral)   Resp 12   Ht 5\' 11"  (1.803 m)   Wt 72.1 kg (159 lb)   SpO2 100%   BMI 22.18 kg/m   HEMODYNAMICS:    VENTILATOR SETTINGS:    INTAKE / OUTPUT: I/O last 3 completed shifts: In: 4300 [IV Piggyback:4300] Out: 100 [Urine:100]  PHYSICAL EXAMINATION: General:  Not in acute distress Neuro: AAOx3, CN  II-XII grossly intact HEENT:  PEERL, EOMI, no scleral icterus No JVD, no lymphadenopathy or thyromegaly  Cardiovascular:  RRR no mrg, normal s1/s2 Lungs:  CTAB, no rales, wheeze, rhonci Abdomen:  Mildly distended, diffusely tender, guarding, small liver, enlarged spleen  Musculoskeletal:  No apparent deformities, cyanosis, or clubbing, moves all extremities Skin:  No rashes, bruising  LABS:   BMET  Recent Labs Lab 09/30/16 0522 10/04/16 2100 10/05/16 0318  NA 135 132* 134*  K 4.6 5.0 4.5  CL 103 99* 105  CO2 22 24 24   BUN 11  18 14   CREATININE 0.93 0.83 0.82  GLUCOSE 103* 115* 102*    Electrolytes  Recent Labs Lab 09/30/16 0522 10/04/16 2100 10/05/16 0318  CALCIUM 8.9 8.2* 7.3*    CBC  Recent Labs Lab 10/04/16 2100 10/05/16 0318  WBC 3.7* 5.6  HGB 11.5* 11.1*  HCT 35.2* 33.9*  PLT 62* 48*    Coag's  Recent Labs Lab 10/04/16 2100  APTT 57*  INR 1.35    Sepsis Markers  Recent Labs Lab 10/04/16 2100  10/05/16 0010 10/05/16 0318 10/05/16 0630  LATICACIDVEN  --   < > 0.90 1.3 1.4  PROCALCITON 0.45  --   --  3.27  --   < > = values in this interval not displayed.  ABG No results for input(s): PHART, PCO2ART, PO2ART in the last 168 hours.  Liver Enzymes  Recent Labs Lab 10/04/16 2100 10/05/16 0318  AST 81* 59*  ALT 50 41  ALKPHOS 189* 153*  BILITOT 1.0 0.7  ALBUMIN 2.5* 2.1*    Cardiac Enzymes  Recent Labs Lab 10/04/16 2100  TROPONINI <0.03    Glucose No results for input(s): GLUCAP in the last 168 hours.  Imaging Ct Abdomen Pelvis W Contrast  Result Date: 10/05/2016 CLINICAL DATA:  Abdominal pain and swelling, vomiting blood recent paracentesis EXAM: CT ABDOMEN AND PELVIS WITH CONTRAST TECHNIQUE: Multidetector CT imaging of the abdomen and pelvis was performed using the standard protocol following bolus administration of intravenous contrast. CONTRAST:  ISOVUE-300 IOPAMIDOL (ISOVUE-300) INJECTION 61% COMPARISON:  09/30/2016, 07/14/2016 FINDINGS: Lower chest: Patchy dependent atelectasis. Nodular atelectasis or scarring in the posterior right lung base. No effusion. Heart size normal. No large pericardial effusion. Hepatobiliary: Nodular contour of the liver consistent with cirrhosis. Heterogenous liver enhancement but without discrete mass. Portal veins and splenic vein appear patent. The gallbladder is dilated but decreased compared to prior. No intra or extrahepatic biliary dilatation. Pancreas: Unremarkable. No pancreatic ductal dilatation or surrounding  inflammatory changes. Spleen: Markedly enlarged at 20 cm. Adrenals/Urinary Tract: Adrenal glands are within normal limits. Small calcified stone in the mid to upper right kidney. No hydronephrosis. Bladder is normal. Stomach/Bowel: The stomach is collapsed. There is distal esophageal thickening. No dilated large or small bowel. Probable esophageal varices. Vascular/Lymphatic: Aortic caliber is normal. There is atherosclerosis present. No grossly enlarged lymph nodes. Reproductive: Prostate gland slightly enlarged with few calcifications. Other: No free air. Small to moderate ascites, decreased compared to comparison exam. Musculoskeletal: No acute osseous abnormality. Degenerative changes of the spine with gas within the disc space and posterior to the L4 vertebral body. IMPRESSION: 1. Nodular liver contour or consistent with cirrhosis. Findings consistent with portal hypertension including marked splenomegaly and probable esophageal varices. 2. Small to moderate volume of ascites, decreased compared to prior. 3. Distal esophageal thickening could relate to esophagitis. 4. Dilated gallbladder but decreased compared to prior. No intra or extrahepatic biliary dilatation. 5. Nonobstructing stone in the right  kidney. Electronically Signed   By: Jasmine PangKim  Fujinaga M.D.   On: 10/05/2016 01:51   Dg Chest Port 1 View  Result Date: 10/04/2016 CLINICAL DATA:  Sepsis. EXAM: PORTABLE CHEST 1 VIEW COMPARISON:  09/25/2016 FINDINGS: Cardiomediastinal silhouette is within normal limits. Diffuse interstitial coarsening is unchanged and compatible with chronic lung disease. There is no evidence of acute airspace consolidation, overt edema, pleural effusion, or pneumothorax. No acute osseous abnormality is seen. IMPRESSION: No active disease. Electronically Signed   By: Sebastian AcheAllen  Grady M.D.   On: 10/04/2016 22:25     STUDIES:  CT abdomen 10/23- IMPRESSION: 1. Nodular liver contour or consistent with cirrhosis. Findings consistent  with portal hypertension including marked splenomegaly and probable esophageal varices. 2. Small to moderate volume of ascites, decreased compared to prior. 3. Distal esophageal thickening could relate to esophagitis. 4. Dilated gallbladder but decreased compared to prior. No intra or extrahepatic biliary dilatation. 5. Nonobstructing stone in the right kidney.  CULTURES: None to date  ANTIBIOTICS: Vancomycin 1gm IV Q8H Zosyn 3.375gm IV Q8H (4 hr inf)  SIGNIFICANT EVENTS: none  LINES/TUBES: none  DISCUSSION: Pt with possibly SBP and hypotension. Will be admitting to CCM for monitoring.   ASSESSMENT / PLAN:  PULMONARY A: No acute issues P:   Pt with clear breath sounds and no SOB. Will monitor fluid status as pt receiving large volumes for sepsis.  CARDIOVASCULAR A:  Hypotension P:  Pt received 5 bolus NS + albumin in ED. Pressure currently 91/61 and pt asymptomatic. Not currently requiring pressure support however if needed, will use dopamine due to low HR.  RENAL A:   No acute issues P:   Will follow labs  GASTROINTESTINAL A:   Subjective hematemesis/melena P:   Pt with prior gastric ulcer and known liver disease. Currently on protonix drip. H&H stable. Will check q8 CBC   HEMATOLOGIC A:   Thrombocytopenia Anemia P:  Plt 51. Hbg 10.3. Chronic with pt liver disease. Will hold DVT ppx and monitor q8 CBC. No evidence of bleeding at this time.   INFECTIOUS A:   SBP? P:   Febrile with septic shock. Lactic acid 2.5 and procal elevated at 3.27. Recent paracentesis and worsening abdominal tenderness. Pt on Vanc and Zosyn. Pharmacy following abx.   ENDOCRINE A:   No acute issues   P:   Cortisol pending  NEUROLOGIC A:   No acute issues P:     FAMILY  - Updates: no family at bedside at this time  - Inter-disciplinary family meet or Palliative Care meeting due by:   Pulmonary and Critical Care Medicine  HealthCare Pager: [phone]  [phone]  10/05/2016, 8:59 AM   Ephriam JenkinsPaige Ganem, MS4  STAFF NOTE: I, Rory Percyaniel Feinstein, MD FACP have personally reviewed patient's available data, including medical history, events of note, physical examination and test results as part of my evaluation. I have discussed with resident/NP and other care providers such as pharmacist, RN and RRT. In addition, I personally evaluated patient and elicited key findings of: awake, mentation wnl, ab od tender and soft, distended, CT with minimal ascites over liver not a sig amount to tap, will treat presumed SB P, lactic re assuring, he is having normal MS with current MAP 67, with SB P and sepsis would benefit possibly from albumin will schedule this, get cortisol, will consider US assessment ab do and correlate with CT, he is nosocomial, change to zosyn, add vanc for enteroccus, if pressors needed, add dopamine at b eta afect ,  tring to avoid line, has gotten 5 liters, repeat sepsis assessment done by me, does have some int prominence, could use noninvasive monitor if we have this here now, may need redcution in volume, assess O2 needs over time The patient is critically ill with multiple organ systems failure and requires high complexity decision making for assessment and support, frequent evaluation and titration of therapies, application of advanced monitoring technologies and extensive interpretation of multiple databases.   Critical Care Time devoted to patient care services described in this note is 35 Minutes. This time reflects time of care of this signee: Rory Percy, MD FACP. This critical care time does not reflect procedure time, or teaching time or supervisory time of PA/NP/Med student/Med Resident etc but could involve care discussion time. Rest per NP/medical resident whose note is outlined above and that I agree with   Mcarthur Rossetti. Tyson Alias, MD, FACP Pgr: [phone] [facility] 10/05/2016 10:24 AM

## 2016-10-05 NOTE — Progress Notes (Signed)
Patient was unstable with low blood pressures 80/50's and map 60's.  Talked to ER and was calling MD since 3 L boluses of NS were given with lasting results.  No call back but previous note says he is not to be taken to the floor.

## 2016-10-05 NOTE — ED Notes (Signed)
Attempted report x1. 

## 2016-10-06 DIAGNOSIS — K7460 Unspecified cirrhosis of liver: Secondary | ICD-10-CM

## 2016-10-06 DIAGNOSIS — K652 Spontaneous bacterial peritonitis: Secondary | ICD-10-CM

## 2016-10-06 DIAGNOSIS — B9689 Other specified bacterial agents as the cause of diseases classified elsewhere: Secondary | ICD-10-CM

## 2016-10-06 DIAGNOSIS — K920 Hematemesis: Secondary | ICD-10-CM

## 2016-10-06 DIAGNOSIS — B182 Chronic viral hepatitis C: Secondary | ICD-10-CM

## 2016-10-06 DIAGNOSIS — Z818 Family history of other mental and behavioral disorders: Secondary | ICD-10-CM

## 2016-10-06 DIAGNOSIS — F1911 Other psychoactive substance abuse, in remission: Secondary | ICD-10-CM

## 2016-10-06 DIAGNOSIS — R188 Other ascites: Secondary | ICD-10-CM

## 2016-10-06 DIAGNOSIS — B3789 Other sites of candidiasis: Secondary | ICD-10-CM

## 2016-10-06 DIAGNOSIS — B181 Chronic viral hepatitis B without delta-agent: Secondary | ICD-10-CM

## 2016-10-06 DIAGNOSIS — F17210 Nicotine dependence, cigarettes, uncomplicated: Secondary | ICD-10-CM

## 2016-10-06 DIAGNOSIS — K254 Chronic or unspecified gastric ulcer with hemorrhage: Secondary | ICD-10-CM

## 2016-10-06 LAB — BLOOD CULTURE ID PANEL (REFLEXED)
ACINETOBACTER BAUMANNII: NOT DETECTED
CANDIDA ALBICANS: NOT DETECTED
CANDIDA GLABRATA: NOT DETECTED
Candida krusei: NOT DETECTED
Candida parapsilosis: NOT DETECTED
Candida tropicalis: DETECTED — AB
ENTEROBACTER CLOACAE COMPLEX: NOT DETECTED
ENTEROBACTERIACEAE SPECIES: NOT DETECTED
Enterococcus species: NOT DETECTED
Escherichia coli: NOT DETECTED
Haemophilus influenzae: NOT DETECTED
Klebsiella oxytoca: NOT DETECTED
Klebsiella pneumoniae: NOT DETECTED
LISTERIA MONOCYTOGENES: NOT DETECTED
NEISSERIA MENINGITIDIS: NOT DETECTED
PSEUDOMONAS AERUGINOSA: NOT DETECTED
Proteus species: NOT DETECTED
STREPTOCOCCUS AGALACTIAE: NOT DETECTED
STREPTOCOCCUS PNEUMONIAE: NOT DETECTED
Serratia marcescens: NOT DETECTED
Staphylococcus aureus (BCID): NOT DETECTED
Staphylococcus species: NOT DETECTED
Streptococcus pyogenes: NOT DETECTED
Streptococcus species: NOT DETECTED

## 2016-10-06 LAB — URINE CULTURE: CULTURE: NO GROWTH

## 2016-10-06 LAB — CBC
HCT: 31.4 % — ABNORMAL LOW (ref 39.0–52.0)
Hemoglobin: 10.1 g/dL — ABNORMAL LOW (ref 13.0–17.0)
MCH: 29.8 pg (ref 26.0–34.0)
MCHC: 32.2 g/dL (ref 30.0–36.0)
MCV: 92.6 fL (ref 78.0–100.0)
PLATELETS: 67 10*3/uL — AB (ref 150–400)
RBC: 3.39 MIL/uL — AB (ref 4.22–5.81)
RDW: 14.7 % (ref 11.5–15.5)
WBC: 3.3 10*3/uL — AB (ref 4.0–10.5)

## 2016-10-06 LAB — GLUCOSE, CAPILLARY: GLUCOSE-CAPILLARY: 127 mg/dL — AB (ref 65–99)

## 2016-10-06 MED ORDER — FENTANYL CITRATE (PF) 100 MCG/2ML IJ SOLN
25.0000 ug | INTRAMUSCULAR | Status: DC | PRN
  Administered 2016-10-06 – 2016-10-07 (×9): 25 ug via INTRAVENOUS
  Filled 2016-10-06 (×9): qty 2

## 2016-10-06 MED ORDER — DEXTROSE 5 % IV SOLN
2.0000 g | INTRAVENOUS | Status: DC
  Administered 2016-10-06 – 2016-10-07 (×2): 2 g via INTRAVENOUS
  Filled 2016-10-06 (×3): qty 2

## 2016-10-06 MED ORDER — FLUCONAZOLE IN SODIUM CHLORIDE 400-0.9 MG/200ML-% IV SOLN
800.0000 mg | Freq: Once | INTRAVENOUS | Status: AC
  Administered 2016-10-06: 800 mg via INTRAVENOUS
  Filled 2016-10-06: qty 400

## 2016-10-06 MED ORDER — DEXTROSE 5 % IV SOLN
2.0000 g | INTRAVENOUS | Status: DC
  Filled 2016-10-06: qty 2

## 2016-10-06 MED ORDER — FLUCONAZOLE IN SODIUM CHLORIDE 400-0.9 MG/200ML-% IV SOLN
400.0000 mg | INTRAVENOUS | Status: DC
  Administered 2016-10-07 – 2016-10-08 (×2): 400 mg via INTRAVENOUS
  Filled 2016-10-06 (×2): qty 200

## 2016-10-06 NOTE — Progress Notes (Signed)
Pharmacy Antibiotic Note  Dan [name] is a 45 y.o. male admitted on 10/04/2016 with abdominal pain concerning for SBP.  Pharmacy has been consulted for change from Vancomycin and Zosyn to Ceftriaxone dosing for SBP. MD dosing fluconazole for Candida tropicalis bacteremia. EGD planned for 10/26. Patient is now afebrile and wbc has trended down. LA has normalized.    Plan: Continue with ordered Ceftriaxone 2g IV every 24 hours.  Pharmacy will sign off as adjustment is not needed.   Height: 5\' 11"  (180.3 cm) Weight: 159 lb 3.2 oz (72.2 kg) IBW/kg (Calculated) : 75.3  Temp (24hrs), Avg:97.9 F (36.6 C), Min:97.5 F (36.4 C), Max:98.4 F (36.9 C)   Recent Labs Lab 09/30/16 0522 10/04/16 2055 10/04/16 2100 10/04/16 2119 10/05/16 0010 10/05/16 0318 10/05/16 0630 10/05/16 0853 10/05/16 1629 10/06/16 0009  WBC  --   --  3.7*  --   --  5.6  --  4.9 3.7* 3.3*  CREATININE 0.93  --  0.83  --   --  0.82  --   --   --   --   LATICACIDVEN  --  2.5*  --  3.13* 0.90 1.3 1.4  --   --   --     Estimated Creatinine Clearance: 113.7 mL/min (by C-G formula based on SCr of 0.82 mg/dL).    No Known Allergies  Antimicrobials this admission: CTX x 1 10/23; 10/24 >> Vanc 10/23>>10/24 Zosyn 10/23>>10/24  Fluconazole 10/24 >>  Dose adjustments this admission:   Microbiology:  10/22BCx: Candida tropicalis in 2/2 10/22UCx: IP 10/23 MRSA PCR: neg Thank you for allowing pharmacy to be a part of this patient's care.  Link SnufferJessica Thoms Barthelemy, PharmD, BCPS Clinical Pharmacist [phone] 10/06/2016 12:25 PM

## 2016-10-06 NOTE — Care Management Note (Signed)
Case Management Note  Patient Details  Name: Dan [name] MRN: [id number] Date of Birth: March 19, 1971  Subjective/Objective:  Pt admitted with fever                  Action/Plan:  CM assessed pt; pt was alert and oriented during assessment.  Pt stated he has had recent falls and more limitations than normal with mobility.  Pt  lives at home with elderly mom.  Pt requested evaluation for post acute facility - PT/OT eval ordered.  CM will continue to follow for discharge needs   Expected Discharge Date:                  Expected Discharge Plan:     In-House Referral:     Discharge planning Services  CM Consult  Post Acute Care Choice:    Choice offered to:     DME Arranged:    DME Agency:     HH Arranged:    HH Agency:     Status of Service:  In process, will continue to follow  If discussed at Long Length of Stay Meetings, dates discussed:    Additional Comments:  Cherylann ParrClaxton, Arthor Gorter S, RN 10/06/2016, 2:04 PM

## 2016-10-06 NOTE — Care Management (Signed)
Pt has active medicaid verified within Epic system - pt can not receive medication assistance due to current prescription coverage

## 2016-10-06 NOTE — Progress Notes (Signed)
PHARMACY - PHYSICIAN COMMUNICATION CRITICAL VALUE ALERT - BLOOD CULTURE IDENTIFICATION (BCID)  Results for orders placed or performed during the hospital encounter of 10/04/16  Blood Culture ID Panel (Reflexed) (Collected: 10/04/2016  8:10 PM)  Result Value Ref Range   Enterococcus species NOT DETECTED NOT DETECTED   Listeria monocytogenes NOT DETECTED NOT DETECTED   Staphylococcus species NOT DETECTED NOT DETECTED   Staphylococcus aureus NOT DETECTED NOT DETECTED   Streptococcus species NOT DETECTED NOT DETECTED   Streptococcus agalactiae NOT DETECTED NOT DETECTED   Streptococcus pneumoniae NOT DETECTED NOT DETECTED   Streptococcus pyogenes NOT DETECTED NOT DETECTED   Acinetobacter baumannii NOT DETECTED NOT DETECTED   Enterobacteriaceae species NOT DETECTED NOT DETECTED   Enterobacter cloacae complex NOT DETECTED NOT DETECTED   Escherichia coli NOT DETECTED NOT DETECTED   Klebsiella oxytoca NOT DETECTED NOT DETECTED   Klebsiella pneumoniae NOT DETECTED NOT DETECTED   Proteus species NOT DETECTED NOT DETECTED   Serratia marcescens NOT DETECTED NOT DETECTED   Haemophilus influenzae NOT DETECTED NOT DETECTED   Neisseria meningitidis NOT DETECTED NOT DETECTED   Pseudomonas aeruginosa NOT DETECTED NOT DETECTED   Candida albicans NOT DETECTED NOT DETECTED   Candida glabrata NOT DETECTED NOT DETECTED   Candida krusei NOT DETECTED NOT DETECTED   Candida parapsilosis NOT DETECTED NOT DETECTED   Candida tropicalis DETECTED (A) NOT DETECTED    Name of physician (or Provider) Contacted: Dr. Isaiah SergeMannam  Changes to prescribed antibiotics required: Add diflucan 800mg  now then 400mg  IV daily. ID will be automatically consulted  Christoper Fabianaron Yotam Rhine, PharmD, BCPS Clinical pharmacist, pager [phone] 10/06/2016  6:42 AM

## 2016-10-06 NOTE — Progress Notes (Signed)
Daily Rounding Note  10/06/2016, 9:17 AM  LOS: 1 day   SUBJECTIVE:   Chief complaint: abdominial pain.  Abdomen is tight.   Ongoing abd pain and frequent requests for his narcotics.  No BM.  No nausea.  Eating   OBJECTIVE:         Vital signs in last 24 hours:    Temp:  [97.5 F (36.4 C)-98.7 F (37.1 C)] 97.8 F (36.6 C) (10/24 0747) Pulse Rate:  [30-88] 34 (10/24 0500) Resp:  [10-23] 19 (10/24 0500) BP: (94-108)/(50-89) 103/64 (10/24 0500) SpO2:  [47 %-98 %] 94 % (10/24 0500) Weight:  [72.2 kg (159 lb 3.2 oz)] 72.2 kg (159 lb 3.2 oz) (10/24 0500)   Filed Weights   10/04/16 2051 10/06/16 0500  Weight: 72.1 kg (159 lb) 72.2 kg (159 lb 3.2 oz)   General: alert, empty meal tray in front of him   Heart: RRR Chest: clear bil.   Abdomen: distended, umbilical hernia.    Extremities: no CCE Neuro/Psych:  Alert, oriented x 3.  No asterixis.  No gross deficits.   Intake/Output from previous day: 10/23 0701 - 10/24 0700 In: 3218.8 [I.V.:693.8; IV Piggyback:2525] Out: 2200 [Urine:2200]  Intake/Output this shift: No intake/output data recorded.  Lab Results:  Recent Labs  10/05/16 0853 10/05/16 1629 10/06/16 0009  WBC 4.9 3.7* 3.3*  HGB 10.3* 9.9* 10.1*  HCT 31.9* 31.0* 31.4*  PLT 51* 47* 67*   BMET  Recent Labs  10/04/16 2100 10/05/16 0318  NA 132* 134*  K 5.0 4.5  CL 99* 105  CO2 24 24  GLUCOSE 115* 102*  BUN 18 14  CREATININE 0.83 0.82  CALCIUM 8.2* 7.3*   LFT  Recent Labs  10/04/16 2100 10/05/16 0318  PROT 6.7 5.6*  ALBUMIN 2.5* 2.1*  AST 81* 59*  ALT 50 41  ALKPHOS 189* 153*  BILITOT 1.0 0.7   PT/INR  Recent Labs  10/04/16 2100  LABPROT 16.7*  INR 1.35   Hepatitis Panel No results for input(s): HEPBSAG, HCVAB, HEPAIGM, HEPBIGM in the last 72 hours.  Studies/Results: Ct Abdomen Pelvis W Contrast  Result Date: 10/05/2016 CLINICAL DATA:  Abdominal pain and swelling,  vomiting blood recent paracentesis EXAM: CT ABDOMEN AND PELVIS WITH CONTRAST TECHNIQUE: Multidetector CT imaging of the abdomen and pelvis was performed using the standard protocol following bolus administration of intravenous contrast. CONTRAST:  ISOVUE-300 IOPAMIDOL (ISOVUE-300) INJECTION 61% COMPARISON:  09/30/2016, 07/14/2016 FINDINGS: Lower chest: Patchy dependent atelectasis. Nodular atelectasis or scarring in the posterior right lung base. No effusion. Heart size normal. No large pericardial effusion. Hepatobiliary: Nodular contour of the liver consistent with cirrhosis. Heterogenous liver enhancement but without discrete mass. Portal veins and splenic vein appear patent. The gallbladder is dilated but decreased compared to prior. No intra or extrahepatic biliary dilatation. Pancreas: Unremarkable. No pancreatic ductal dilatation or surrounding inflammatory changes. Spleen: Markedly enlarged at 20 cm. Adrenals/Urinary Tract: Adrenal glands are within normal limits. Small calcified stone in the mid to upper right kidney. No hydronephrosis. Bladder is normal. Stomach/Bowel: The stomach is collapsed. There is distal esophageal thickening. No dilated large or small bowel. Probable esophageal varices. Vascular/Lymphatic: Aortic caliber is normal. There is atherosclerosis present. No grossly enlarged lymph nodes. Reproductive: Prostate gland slightly enlarged with few calcifications. Other: No free air. Small to moderate ascites, decreased compared to comparison exam. Musculoskeletal: No acute osseous abnormality. Degenerative changes of the spine with gas within the disc space and  posterior to the L4 vertebral body. IMPRESSION: 1. Nodular liver contour or consistent with cirrhosis. Findings consistent with portal hypertension including marked splenomegaly and probable esophageal varices. 2. Small to moderate volume of ascites, decreased compared to prior. 3. Distal esophageal thickening could relate to  esophagitis. 4. Dilated gallbladder but decreased compared to prior. No intra or extrahepatic biliary dilatation. 5. Nonobstructing stone in the right kidney. Electronically Signed   By: Jasmine PangKim  Fujinaga M.D.   On: 10/05/2016 01:51   Dg Chest Port 1 View  Result Date: 10/04/2016 CLINICAL DATA:  Sepsis. EXAM: PORTABLE CHEST 1 VIEW COMPARISON:  09/25/2016 FINDINGS: Cardiomediastinal silhouette is within normal limits. Diffuse interstitial coarsening is unchanged and compatible with chronic lung disease. There is no evidence of acute airspace consolidation, overt edema, pleural effusion, or pneumothorax. No acute osseous abnormality is seen. IMPRESSION: No active disease. Electronically Signed   By: Sebastian AcheAllen  Grady M.D.   On: 10/04/2016 22:25   Scheduled Meds: . albumin human  12.5 g Intravenous Q6H  . [START ON 10/07/2016] fluconazole (DIFLUCAN) IV  400 mg Intravenous Q24H  . fluconazole (DIFLUCAN) IV  800 mg Intravenous Once  . lactulose  20 g Oral Daily  . pantoprazole  40 mg Oral BID  . piperacillin-tazobactam (ZOSYN)  IV  3.375 g Intravenous Q8H  . sodium chloride flush  3 mL Intravenous Q12H  . vancomycin  1,000 mg Intravenous Q8H   Continuous Infusions: . sodium chloride Stopped (10/05/16 2220)   PRN Meds:.sodium chloride, albuterol, fentaNYL (SUBLIMAZE) injection, ondansetron (ZOFRAN) IV   ASSESMENT:   *  GI bleed.  Hematemesis 10/21, resolved.  BID oral Protonix in place.  Hemoglobin is stable. 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 Suspect bleeding and problems are due to ongoing ulcer since he has not been compliant with PPI therapy other than during the recent hospital stay 1+ week ago.  *  Sepsis, funguria.  Yeast on 2/2 blood cultures. PCR + for candida tropicalis.  On IV diflucan.      *  Ascites.  SBP not present at tap last week but suspect this is SBP.  On vanc, zosyn  *  Generalized abdominal pain.  ? If due to SBP  *  Cirrhosis, hep C and  hep B positive. Has never followed up with infectious disease.   *  Normocytic anemia.    *  Chronic thrombocytopenia.     PLAN   *  EGD on 10/26.  Will arrange.      Jennye MoccasinSarah Gribbin  10/06/2016, 9:17 AM Pager: [phone]  I have discussed the case with the PA, and that is the plan I formulated. I personally interviewed and examined the patient.  CC: generalized abdominal pain  Add'l Dx: SBP  hematemesis  gastric ulcer  Continue Abx, then EGD day after tomorrow   [name] Pager [phone]  Mon-Fri 8a-5p [phone] after 5p, weekends, holidays

## 2016-10-06 NOTE — Progress Notes (Addendum)
CSW consult acknowledged re: "Primary Care Needs". Per record, Patient ran out of Protonix early 08/2016. He never followed up with GI (missed appointment) or with ID. Patient was discharged 10/19 but left the hospital before he was able to get paperwork, thus he never got his prescriptions. Patient is noncompliant with meds and follow up. CSW staffed with RN case manager for medication assistance and PCP needs. CSW signing off. Please contact if new need(s) arise.         Lance MussAshley Gardner,MSW, LCSW [facility] Clinical Social Worker [phone]

## 2016-10-06 NOTE — Consult Note (Addendum)
[facility]  Total days of antibiotics 3        Day 2 ceftriaxone        Day 1 fluconazole               Reason for Consult: fungemia    Referring Physician: feinstein  Principal Problem:   Abdominal pain Active Problems:   Other cirrhosis of liver (HCC)   Hematemesis   SBP (spontaneous bacterial peritonitis) (HCC)   Ascites   Nausea & vomiting   Sepsis ([place])   GIB (gastrointestinal bleeding)   Acute respiratory failure with hypoxia (HCC)   Septic shock (HCC)    HPI: Dan [name] is a 45 y.o. male with history of IVDU, quit in 2016, chronic hepatitis C GT1a without hepatic coma, untreated, and chronic hepatitis B  Dx in 2017 c/b  decompensated cirrhosis with ascites requiring LVP and UGIB due to large gastric ulcer and portal hypertensive gastropathy.at that hospitalization, he was ruled out for SBP and did not require prophylaxis. He was discharged on the 19th and readmitted on 22nd for worsening abdominal pain, fevers up to 103F, and reported 2 episodes of hematemesis. He was started on IV abtx and fluids. He reported that he did not pick up his medications for diuretics or PPI from his discharge on the 19th. He reports no further hematemesis, still has abdominal pain. GI following to repeat EGD. 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 He repeats stopping drug use, previously injecting opana (per record review) last year  Past Medical History:  Diagnosis Date  . Cirrhosis of liver ([place])   . Gastric ulceration   . Hepatitis B   . Hepatitis C   . Polysubstance abuse   . Upper GI bleed   - hx of empyema s/p decortication in 2012  Allergies: No Known Allergies   MEDICATIONS: . cefTRIAXone (ROCEPHIN)  IV  2 g Intravenous Q24H  . [START ON 10/07/2016] fluconazole (DIFLUCAN) IV  400 mg Intravenous Q24H  . lactulose  20 g Oral Daily  . pantoprazole  40 mg Oral BID  . sodium chloride flush   3 mL Intravenous Q12H    Social History  Substance Use Topics  . Smoking status: Current Every Day Smoker    Packs/day: 0.50    Types: Cigarettes  . Smokeless tobacco: Never Used  . Alcohol use No    Family History  Problem Relation Age of Onset  . Dementia Mother      Review of Systems  Constitutional: positive for fever, chills, diaphoresis, activity change, appetite change, fatigue and unexpected weight change.  HENT: Negative for congestion, sore throat, rhinorrhea, sneezing, trouble swallowing and sinus pressure.  Eyes: Negative for photophobia and visual disturbance.  Respiratory: Negative for cough, chest tightness, shortness of breath, wheezing and stridor.  Cardiovascular: Negative for chest pain, palpitations and leg swelling.  Gastrointestinal: + for nausea, vomiting, abdominal pain, hematemesis but negative diarrhea, constipation, blood in stool, abdominal distention and anal bleeding.  Genitourinary: Negative for dysuria, hematuria, flank pain and difficulty urinating.  Musculoskeletal: Negative for myalgias, back pain, joint swelling, arthralgias and gait problem.  Skin: Negative for color change, pallor, rash and wound.  Neurological: Negative for dizziness, tremors, weakness and light-headedness.  Hematological: Negative for adenopathy. Does not bruise/bleed easily.  Psychiatric/Behavioral: Negative for behavioral problems, confusion, sleep disturbance, dysphoric mood, decreased concentration and agitation.     OBJECTIVE: Temp:  [97.5  F (36.4 C)-98.4 F (36.9 C)] 98.4 F (36.9 C) (10/24 1128) Pulse Rate:  [30-88] 52 (10/24 1200) Resp:  [12-23] 15 (10/24 1200) BP: (95-129)/(60-89) 121/67 (10/24 1200) SpO2:  [47 %-98 %] 97 % (10/24 1200) Weight:  [159 lb 3.2 oz (72.2 kg)] 159 lb 3.2 oz (72.2 kg) (10/24 0500) Physical Exam  Constitutional: He is oriented to person, place, and time. He appears well-developed and well-nourished. No distress.  HENT:    Mouth/Throat: Oropharynx is clear and moist. No oropharyngeal exudate.  Cardiovascular: Normal rate, regular rhythm and normal heart sounds. Exam reveals no gallop and no friction rub.  No murmur heard.  Pulmonary/Chest: Effort normal and breath sounds normal. No respiratory distress. He has no wheezes.  Abdominal: Soft. Bowel sounds are normal. He has mild distension but mostly due to ascites. + umbilical hernia. He is mildly tender to palpation.  Lymphadenopathy:  He has no cervical adenopathy.  Neurological: He is alert and oriented to person, place, and time. No asterisix Skin: Skin is warm and dry. No rash noted. No erythema.  Psychiatric: He has a normal mood and affect. His behavior is normal.     LABS: Results for orders placed or performed during the hospital encounter of 10/04/16 (from the past 48 hour(s))  Blood Culture (routine x 2)     Status: Abnormal (Preliminary result)   Collection Time: 10/04/16  8:10 PM  Result Value Ref Range   Specimen Description BLOOD RIGHT FOREARM    Special Requests BOTTLES DRAWN AEROBIC AND ANAEROBIC 5CC EA    Culture  Setup Time      YEAST AEROBIC BOTTLE ONLY CRITICAL RESULT CALLED TO, READ BACK BY AND VERIFIED WITH: CARON AMEND,PHARMD '@0630'  10/06/16 MKELLY,MLT    Culture YEAST (A)    Report Status PENDING   Blood Culture ID Panel (Reflexed)     Status: Abnormal   Collection Time: 10/04/16  8:10 PM  Result Value Ref Range   Enterococcus species NOT DETECTED NOT DETECTED   Listeria monocytogenes NOT DETECTED NOT DETECTED   Staphylococcus species NOT DETECTED NOT DETECTED   Staphylococcus aureus NOT DETECTED NOT DETECTED   Streptococcus species NOT DETECTED NOT DETECTED   Streptococcus agalactiae NOT DETECTED NOT DETECTED   Streptococcus pneumoniae NOT DETECTED NOT DETECTED   Streptococcus pyogenes NOT DETECTED NOT DETECTED   Acinetobacter baumannii NOT DETECTED NOT DETECTED   Enterobacteriaceae species NOT DETECTED NOT DETECTED    Enterobacter cloacae complex NOT DETECTED NOT DETECTED   Escherichia coli NOT DETECTED NOT DETECTED   Klebsiella oxytoca NOT DETECTED NOT DETECTED   Klebsiella pneumoniae NOT DETECTED NOT DETECTED   Proteus species NOT DETECTED NOT DETECTED   Serratia marcescens NOT DETECTED NOT DETECTED   Haemophilus influenzae NOT DETECTED NOT DETECTED   Neisseria meningitidis NOT DETECTED NOT DETECTED   Pseudomonas aeruginosa NOT DETECTED NOT DETECTED   Candida albicans NOT DETECTED NOT DETECTED   Candida glabrata NOT DETECTED NOT DETECTED   Candida krusei NOT DETECTED NOT DETECTED   Candida parapsilosis NOT DETECTED NOT DETECTED   Candida tropicalis DETECTED (A) NOT DETECTED    Comment: CRITICAL RESULT CALLED TO, READ BACK BY AND VERIFIED WITH: CARON AMEND,PHARMD '@0630'  10/06/16 MKELLY,MLT   Lactic acid, plasma     Status: Abnormal   Collection Time: 10/04/16  8:55 PM  Result Value Ref Range   Lactic Acid, Venous 2.5 (HH) 0.5 - 1.9 mmol/L    Comment: CRITICAL VALUE NOTED.  VALUE IS CONSISTENT WITH PREVIOUSLY REPORTED AND CALLED VALUE. CRITICAL RESULT  CALLED TO, READ BACK BY AND VERIFIED WITH:  TO CHARMON(RN) BY TCLCEVELAND 10/04/2016 AT 10:34PM   Comprehensive metabolic panel     Status: Abnormal   Collection Time: 10/04/16  9:00 PM  Result Value Ref Range   Sodium 132 (L) 135 - 145 mmol/L   Potassium 5.0 3.5 - 5.1 mmol/L   Chloride 99 (L) 101 - 111 mmol/L   CO2 24 22 - 32 mmol/L   Glucose, Bld 115 (H) 65 - 99 mg/dL   BUN 18 6 - 20 mg/dL   Creatinine, Ser 0.83 0.61 - 1.24 mg/dL   Calcium 8.2 (L) 8.9 - 10.3 mg/dL   Total Protein 6.7 6.5 - 8.1 g/dL   Albumin 2.5 (L) 3.5 - 5.0 g/dL   AST 81 (H) 15 - 41 U/L   ALT 50 17 - 63 U/L   Alkaline Phosphatase 189 (H) 38 - 126 U/L   Total Bilirubin 1.0 0.3 - 1.2 mg/dL   GFR calc non Af Amer >60 >60 mL/min   GFR calc Af Amer >60 >60 mL/min    Comment: (NOTE) The eGFR has been calculated using the CKD EPI equation. This calculation has not been  validated in all clinical situations. eGFR's persistently <60 mL/min signify possible Chronic Kidney Disease.    Anion gap 9 5 - 15  CBC WITH DIFFERENTIAL     Status: Abnormal   Collection Time: 10/04/16  9:00 PM  Result Value Ref Range   WBC 3.7 (L) 4.0 - 10.5 K/uL   RBC 3.87 (L) 4.22 - 5.81 MIL/uL   Hemoglobin 11.5 (L) 13.0 - 17.0 g/dL   HCT 35.2 (L) 39.0 - 52.0 %   MCV 91.0 78.0 - 100.0 fL   MCH 29.7 26.0 - 34.0 pg   MCHC 32.7 30.0 - 36.0 g/dL   RDW 14.3 11.5 - 15.5 %   Platelets 62 (L) 150 - 400 K/uL    Comment: PLATELET COUNT CONFIRMED BY SMEAR   Neutrophils Relative % 88 %   Neutro Abs 3.2 1.7 - 7.7 K/uL   Lymphocytes Relative 8 %   Lymphs Abs 0.3 (L) 0.7 - 4.0 K/uL   Monocytes Relative 3 %   Monocytes Absolute 0.1 0.1 - 1.0 K/uL   Eosinophils Relative 1 %   Eosinophils Absolute 0.0 0.0 - 0.7 K/uL   Basophils Relative 0 %   Basophils Absolute 0.0 0.0 - 0.1 K/uL  Blood Culture (routine x 2)     Status: Abnormal (Preliminary result)   Collection Time: 10/04/16  9:00 PM  Result Value Ref Range   Specimen Description BLOOD LEFT ANTECUBITAL    Special Requests BOTTLES DRAWN AEROBIC AND ANAEROBIC 5CC EA    Culture  Setup Time      YEAST ANAEROBIC BOTTLE ONLY CRITICAL RESULT CALLED TO, READ BACK BY AND VERIFIED WITH: CARON AMEND,PHARMD '@0630'  10/06/16 MKELLY,MLT    Culture YEAST (A)    Report Status PENDING   Lipase, blood     Status: None   Collection Time: 10/04/16  9:00 PM  Result Value Ref Range   Lipase 35 11 - 51 U/L  Procalcitonin     Status: None   Collection Time: 10/04/16  9:00 PM  Result Value Ref Range   Procalcitonin 0.45 ng/mL    Comment:        Interpretation: PCT (Procalcitonin) <= 0.5 ng/mL: Systemic infection (sepsis) is not likely. Local bacterial infection is possible. (NOTE)         ICU PCT Algorithm  Non ICU PCT Algorithm    ----------------------------     ------------------------------         PCT < 0.25 ng/mL                  PCT < 0.1 ng/mL     Stopping of antibiotics            Stopping of antibiotics       strongly encouraged.               strongly encouraged.    ----------------------------     ------------------------------       PCT level decrease by               PCT < 0.25 ng/mL       >= 80% from peak PCT       OR PCT 0.25 - 0.5 ng/mL          Stopping of antibiotics                                             encouraged.     Stopping of antibiotics           encouraged.    ----------------------------     ------------------------------       PCT level decrease by              PCT >= 0.25 ng/mL       < 80% from peak PCT        AND PCT >= 0.5 ng/mL            Continuin g antibiotics                                              encouraged.       Continuing antibiotics            encouraged.    ----------------------------     ------------------------------     PCT level increase compared          PCT > 0.5 ng/mL         with peak PCT AND          PCT >= 0.5 ng/mL             Escalation of antibiotics                                          strongly encouraged.      Escalation of antibiotics        strongly encouraged.   Type and screen [place]     Status: None   Collection Time: 10/04/16  9:00 PM  Result Value Ref Range   ABO/RH(D) O NEG    Antibody Screen NEG    Sample Expiration 10/07/2016   APTT     Status: Abnormal   Collection Time: 10/04/16  9:00 PM  Result Value Ref Range   aPTT 57 (H) 24 - 36 seconds    Comment:        IF BASELINE aPTT IS ELEVATED, SUGGEST PATIENT RISK ASSESSMENT BE USED TO DETERMINE APPROPRIATE ANTICOAGULANT THERAPY.  Protime-INR     Status: Abnormal   Collection Time: 10/04/16  9:00 PM  Result Value Ref Range   Prothrombin Time 16.7 (H) 11.4 - 15.2 seconds   INR 1.35   Troponin I     Status: None   Collection Time: 10/04/16  9:00 PM  Result Value Ref Range   Troponin I <0.03 <0.03 ng/mL  CK     Status: Abnormal   Collection Time:  10/04/16  9:00 PM  Result Value Ref Range   Total CK 34 (L) 49 - 397 U/L  I-Stat CG4 Lactic Acid, ED  (not at  [facility])     Status: Abnormal   Collection Time: 10/04/16  9:19 PM  Result Value Ref Range   Lactic Acid, Venous 3.13 (HH) 0.5 - 1.9 mmol/L   Comment NOTIFIED PHYSICIAN   Urinalysis, Routine w reflex microscopic (not at [facility])     Status: None   Collection Time: 10/04/16  9:59 PM  Result Value Ref Range   Color, Urine YELLOW YELLOW   APPearance CLEAR CLEAR   Specific Gravity, Urine 1.020 1.005 - 1.030   pH 6.0 5.0 - 8.0   Glucose, UA NEGATIVE NEGATIVE mg/dL   Hgb urine dipstick NEGATIVE NEGATIVE   Bilirubin Urine NEGATIVE NEGATIVE   Ketones, ur NEGATIVE NEGATIVE mg/dL   Protein, ur NEGATIVE NEGATIVE mg/dL   Nitrite NEGATIVE NEGATIVE   Leukocytes, UA NEGATIVE NEGATIVE    Comment: MICROSCOPIC NOT DONE ON URINES WITH NEGATIVE PROTEIN, BLOOD, LEUKOCYTES, NITRITE, OR GLUCOSE <1000 mg/dL.  Urine culture     Status: None   Collection Time: 10/04/16  9:59 PM  Result Value Ref Range   Specimen Description URINE, RANDOM    Special Requests NONE    Culture NO GROWTH    Report Status 10/06/2016 FINAL   I-Stat CG4 Lactic Acid, ED  (not at  [facility])     Status: None   Collection Time: 10/05/16 12:10 AM  Result Value Ref Range   Lactic Acid, Venous 0.90 0.5 - 1.9 mmol/L  CBC     Status: Abnormal   Collection Time: 10/05/16  3:18 AM  Result Value Ref Range   WBC 5.6 4.0 - 10.5 K/uL   RBC 3.66 (L) 4.22 - 5.81 MIL/uL   Hemoglobin 11.1 (L) 13.0 - 17.0 g/dL   HCT 33.9 (L) 39.0 - 52.0 %   MCV 92.6 78.0 - 100.0 fL   MCH 30.3 26.0 - 34.0 pg   MCHC 32.7 30.0 - 36.0 g/dL   RDW 14.6 11.5 - 15.5 %   Platelets 48 (L) 150 - 400 K/uL    Comment: REPEATED TO VERIFY CONSISTENT WITH PREVIOUS RESULT   Lactic acid, plasma     Status: None   Collection Time: 10/05/16  3:18 AM  Result Value Ref Range   Lactic Acid, Venous 1.3 0.5 - 1.9 mmol/L  Procalcitonin     Status: None   Collection Time:  10/05/16  3:18 AM  Result Value Ref Range   Procalcitonin 3.27 ng/mL    Comment:        Interpretation: PCT > 2 ng/mL: Systemic infection (sepsis) is likely, unless other causes are known. (NOTE)         ICU PCT Algorithm               Non ICU PCT Algorithm    ----------------------------     ------------------------------         PCT < 0.25 ng/mL  PCT < 0.1 ng/mL     Stopping of antibiotics            Stopping of antibiotics       strongly encouraged.               strongly encouraged.    ----------------------------     ------------------------------       PCT level decrease by               PCT < 0.25 ng/mL       >= 80% from peak PCT       OR PCT 0.25 - 0.5 ng/mL          Stopping of antibiotics                                             encouraged.     Stopping of antibiotics           encouraged.    ----------------------------     ------------------------------       PCT level decrease by              PCT >= 0.25 ng/mL       < 80% from peak PCT        AND PCT >= 0.5 ng/mL            Continuing antibiotics                                               encouraged.       Continuing antibiotics            encouraged.    ----------------------------     ------------------------------     PCT level increase compared          PCT > 0.5 ng/mL         with peak PCT AND          PCT >= 0.5 ng/mL             Escalation of antibiotics                                          strongly encouraged.      Escalation of antibiotics        strongly encouraged.   Comprehensive metabolic panel     Status: Abnormal   Collection Time: 10/05/16  3:18 AM  Result Value Ref Range   Sodium 134 (L) 135 - 145 mmol/L   Potassium 4.5 3.5 - 5.1 mmol/L   Chloride 105 101 - 111 mmol/L   CO2 24 22 - 32 mmol/L   Glucose, Bld 102 (H) 65 - 99 mg/dL   BUN 14 6 - 20 mg/dL   Creatinine, Ser 0.82 0.61 - 1.24 mg/dL   Calcium 7.3 (L) 8.9 - 10.3 mg/dL   Total Protein 5.6 (L) 6.5 - 8.1 g/dL    Albumin 2.1 (L) 3.5 - 5.0 g/dL   AST 59 (H) 15 - 41 U/L   ALT 41 17 - 63 U/L   Alkaline Phosphatase 153 (H) 38 - 126 U/L   Total Bilirubin 0.7 0.3 -  1.2 mg/dL   GFR calc non Af Amer >60 >60 mL/min   GFR calc Af Amer >60 >60 mL/min    Comment: (NOTE) The eGFR has been calculated using the CKD EPI equation. This calculation has not been validated in all clinical situations. eGFR's persistently <60 mL/min signify possible Chronic Kidney Disease.    Anion gap 5 5 - 15  Lactic acid, plasma     Status: None   Collection Time: 10/05/16  6:30 AM  Result Value Ref Range   Lactic Acid, Venous 1.4 0.5 - 1.9 mmol/L  Amylase     Status: None   Collection Time: 10/05/16  8:53 AM  Result Value Ref Range   Amylase 52 28 - 100 U/L  Cortisol     Status: None   Collection Time: 10/05/16  8:53 AM  Result Value Ref Range   Cortisol, Plasma 11.7 ug/dL    Comment: (NOTE) AM    6.7 - 22.6 ug/dL PM   <10.0       ug/dL   Lipase, blood     Status: None   Collection Time: 10/05/16  8:53 AM  Result Value Ref Range   Lipase 31 11 - 51 U/L  CBC     Status: Abnormal   Collection Time: 10/05/16  8:53 AM  Result Value Ref Range   WBC 4.9 4.0 - 10.5 K/uL   RBC 3.43 (L) 4.22 - 5.81 MIL/uL   Hemoglobin 10.3 (L) 13.0 - 17.0 g/dL   HCT 31.9 (L) 39.0 - 52.0 %   MCV 93.0 78.0 - 100.0 fL   MCH 30.0 26.0 - 34.0 pg   MCHC 32.3 30.0 - 36.0 g/dL   RDW 14.8 11.5 - 15.5 %   Platelets 51 (L) 150 - 400 K/uL    Comment: CRITICAL VALUE NOTED.  VALUE IS CONSISTENT WITH PREVIOUSLY REPORTED AND CALLED VALUE.  MRSA PCR Screening     Status: None   Collection Time: 10/05/16 10:45 AM  Result Value Ref Range   MRSA by PCR NEGATIVE NEGATIVE    Comment:        The GeneXpert MRSA Assay (FDA approved for NASAL specimens only), is one component of a comprehensive MRSA colonization surveillance program. It is not intended to diagnose MRSA infection nor to guide or monitor treatment for MRSA infections.   Glucose,  capillary     Status: None   Collection Time: 10/05/16 10:56 AM  Result Value Ref Range   Glucose-Capillary 77 65 - 99 mg/dL   Comment 1 Document in Chart   CBC     Status: Abnormal   Collection Time: 10/05/16  4:29 PM  Result Value Ref Range   WBC 3.7 (L) 4.0 - 10.5 K/uL   RBC 3.32 (L) 4.22 - 5.81 MIL/uL   Hemoglobin 9.9 (L) 13.0 - 17.0 g/dL   HCT 31.0 (L) 39.0 - 52.0 %   MCV 93.4 78.0 - 100.0 fL   MCH 29.8 26.0 - 34.0 pg   MCHC 31.9 30.0 - 36.0 g/dL   RDW 14.8 11.5 - 15.5 %   Platelets 47 (L) 150 - 400 K/uL    Comment: REPEATED TO VERIFY SPECIMEN CHECKED FOR CLOTS CONSISTENT WITH PREVIOUS RESULT   CBC     Status: Abnormal   Collection Time: 10/06/16 12:09 AM  Result Value Ref Range   WBC 3.3 (L) 4.0 - 10.5 K/uL   RBC 3.39 (L) 4.22 - 5.81 MIL/uL   Hemoglobin 10.1 (L) 13.0 - 17.0 g/dL  HCT 31.4 (L) 39.0 - 52.0 %   MCV 92.6 78.0 - 100.0 fL   MCH 29.8 26.0 - 34.0 pg   MCHC 32.2 30.0 - 36.0 g/dL   RDW 14.7 11.5 - 15.5 %   Platelets 67 (L) 150 - 400 K/uL    Comment: CONSISTENT WITH PREVIOUS RESULT  Glucose, capillary     Status: Abnormal   Collection Time: 10/06/16  7:46 AM  Result Value Ref Range   Glucose-Capillary 127 (H) 65 - 99 mg/dL   Comment 1 Notify RN    Comment 2 Document in Chart     MICRO: 10/22 blood cx yeast  IMAGING: Ct Abdomen Pelvis W Contrast  Result Date: 10/05/2016 CLINICAL DATA:  Abdominal pain and swelling, vomiting blood recent paracentesis EXAM: CT ABDOMEN AND PELVIS WITH CONTRAST TECHNIQUE: Multidetector CT imaging of the abdomen and pelvis was performed using the standard protocol following bolus administration of intravenous contrast. CONTRAST:  166m ISOVUE-300 IOPAMIDOL (ISOVUE-300) INJECTION 61% COMPARISON:  09/30/2016, 07/14/2016 FINDINGS: Lower chest: Patchy dependent atelectasis. Nodular atelectasis or scarring in the posterior right lung base. No effusion. Heart size normal. No large pericardial effusion. Hepatobiliary: Nodular contour  of the liver consistent with cirrhosis. Heterogenous liver enhancement but without discrete mass. Portal veins and splenic vein appear patent. The gallbladder is dilated but decreased compared to prior. No intra or extrahepatic biliary dilatation. Pancreas: Unremarkable. No pancreatic ductal dilatation or surrounding inflammatory changes. Spleen: Markedly enlarged at 20 cm. Adrenals/Urinary Tract: Adrenal glands are within normal limits. Small calcified stone in the mid to upper right kidney. No hydronephrosis. Bladder is normal. Stomach/Bowel: The stomach is collapsed. There is distal esophageal thickening. No dilated large or small bowel. Probable esophageal varices. Vascular/Lymphatic: Aortic caliber is normal. There is atherosclerosis present. No grossly enlarged lymph nodes. Reproductive: Prostate gland slightly enlarged with few calcifications. Other: No free air. Small to moderate ascites, decreased compared to comparison exam. Musculoskeletal: No acute osseous abnormality. Degenerative changes of the spine with gas within the disc space and posterior to the L4 vertebral body. IMPRESSION: 1. Nodular liver contour or consistent with cirrhosis. Findings consistent with portal hypertension including marked splenomegaly and probable esophageal varices. 2. Small to moderate volume of ascites, decreased compared to prior. 3. Distal esophageal thickening could relate to esophagitis. 4. Dilated gallbladder but decreased compared to prior. No intra or extrahepatic biliary dilatation. 5. Nonobstructing stone in the right kidney. Electronically Signed   By: KDonavan FoilM.D.   On: 10/05/2016 01:51   Dg Chest Port 1 View  Result Date: 10/04/2016 CLINICAL DATA:  Sepsis. EXAM: PORTABLE CHEST 1 VIEW COMPARISON:  09/25/2016 FINDINGS: Cardiomediastinal silhouette is within normal limits. Diffuse interstitial coarsening is unchanged and compatible with chronic lung disease. There is no evidence of acute airspace  consolidation, overt edema, pleural effusion, or pneumothorax. No acute osseous abnormality is seen. IMPRESSION: No active disease. Electronically Signed   By: ALogan BoresM.D.   On: 10/04/2016 22:25    HISTORICAL MICRO/IMAGING  Assessment/Plan:  89yoM with hx of IVDU, hx of empyema s/p decortication in 2012, recently diagnosed with chronic hepatitis c without hepatic coma GT 1a, as well as chronic hepatitis B./ child-pugh class B. He was admitted in mid October for upper GI bleed and decompensated cirrhosis requiring LVP, SBP ruled out. He was discharged on Oct 19th did not fill any of his medications and returned on Oct 22nd with 2 episodes of hematemesis, worsening abdominal pain and high fever. Admitted for concern for SBP.  His infectious work up has revealed candidemia.( c.tropicalis)  - presumed sbp with associated candidemia, likely need a minimum of 14 day of treatment - recommend to continue with  ceftriaxone 2gm iv daily ( or can do cefotaxime) for SBP for gram negative coverage x 7 days then will need weekly SBP proph  - due to his abdominal pain I suspect that this is SBP, likely involving yeast but unknown if we also have gram negative pathogne., please do a diagnostic paracentesis, even 10-15 mL would do to send for cell count and culture  For fungemia = will need to repeat blood cx tomorrow, and get TTE to rule out endocarditis.   - hx of gastric ulcer at last admission, now with hematemesis = continue with PPI. Agree with EGD to see if any EV  - chronic hep B = will check his hep B viral load  - chronic hep C genotype 1a = will recommend to treat his stabilize his liver disease and had adherence on his maintenance before addressing hep c treatment. Since he has had decomp cirrhosis his outcomes for cure are less than if he did not have cirrhosis. He may need to be seen on [facility] hep c with hepatology clinic  - prior to discharge we should ensure he gets hep A vaccine as well as  pneumococcal vaccine  - ascites = once stable, would restart lasix 64m plus spironolactone 106mdaily to see if it keeps his fluid balance

## 2016-10-06 NOTE — Progress Notes (Signed)
Initial Nutrition Assessment  DOCUMENTATION CODES:   Severe malnutrition in context of chronic illness  INTERVENTION:    Snacks TID between meals.  NUTRITION DIAGNOSIS:   Malnutrition related to chronic illness as evidenced by severe depletion of muscle mass, percent weight loss (39% in 1 year).  GOAL:   Patient will meet greater than or equal to 90% of their needs  MONITOR:   PO intake, Labs, I & O's, Weight trends  REASON FOR ASSESSMENT:   Malnutrition Screening Tool    ASSESSMENT:   45 y/o male with PMH of liver cirrhosis admitted 10/22 with 2 day hx of abdominal pain, n/v with hematemesis, fever and melena. Possible SBP, transferred to the ICU on 10/23 due to concern for sepsis.   Patient has been treated with volume resuscitation. Transferred to the floor today. Patient with good appetite, hungry. Consumed 100% of breakfast this morning. He requests more food.  Reported 100 lb weight loss within the past year. Usual weight 265 lbs. Nutrition-Focused physical exam completed. Findings are mild-moderate fat depletion, severe muscle depletion, and no edema.  Patient with severe PCM. Labs reviewed sodium low at 134. Medications reviewed and include Lactulose.  Diet Order:  Diet 2 gram sodium Room service appropriate? Yes; Fluid consistency: Thin  Skin:  Reviewed, no issues  Last BM:  10/24  Height:   Ht Readings from Last 1 Encounters:  10/06/16 5\' 11"  (1.803 m)    Weight:   Wt Readings from Last 1 Encounters:  10/06/16 161 lb 4.8 oz (73.2 kg)    Ideal Body Weight:  78.2 kg  BMI:  Body mass index is 22.5 kg/m.  Estimated Nutritional Needs:   Kcal:  2100-2300  Protein:  105-125 gm  Fluid:  2.1 L  EDUCATION NEEDS:   No education needs identified at this time  Joaquin CourtsKimberly Harris, RD, LDN, CNSC Pager [phone] After Hours Pager [phone]

## 2016-10-06 NOTE — Progress Notes (Signed)
PULMONARY / CRITICAL CARE MEDICINE   Name: Dan [name] MRN: [id number] DOB: [birth year]    ADMISSION DATE:  10/04/2016 CONSULTATION DATE: 10/04/2016  REFERRING MD:  Clyde Lundborg  CHIEF COMPLAINT:  Abdominal pain  BRIEF SUMMARY:  45 y/o male with PMH of liver cirrhosis admitted 10/22 with 2 day hx of abdominal pain, n/v with hematemesis, fever and melena. Pt was recently admitted 10/13-10/19 where he received paracenteses on 10/15 and 10/18. At that time, there was no evidence of SBP.  Found to have concern for sepsis, ? SBP.  Admitted per [facility].  Treated with volume resuscitation.  BCID worrisome for candida, cultures pending.  PCCM consulted for sepsis.    SUBJECTIVE:  Afebrile, WBC 3.3, started on diflucan overnight with BCID positive for candida tropicalis.   VITAL SIGNS: BP 103/64   Pulse (!) 34   Temp 97.8 F (36.6 C) (Oral)   Resp 19   Ht 5\' 11"  (1.803 m)   Wt 159 lb 3.2 oz (72.2 kg)   SpO2 94%   BMI 22.20 kg/m   HEMODYNAMICS:    VENTILATOR SETTINGS:    INTAKE / OUTPUT: I/O last 3 completed shifts: In: 7518.8 [I.V.:693.8; IV Piggyback:6825] Out: 2300 [Urine:2300]  PHYSICAL EXAMINATION: General:  Not in acute distress Neuro: AAOx3, CN II-XII grossly intact HEENT:  PEERL, EOMI, no scleral icterus No JVD, no lymphadenopathy or thyromegaly  Cardiovascular:  RRR no mrg, normal s1/s2 Lungs:  CTAB, no rales, wheeze, rhonci Abdomen:  Mildly distended, diffusely tender, guarding, small liver, enlarged spleen  Musculoskeletal:  No apparent deformities, cyanosis, or clubbing, moves all extremities Skin:  No rashes, bruising  LABS:   BMET  Recent Labs Lab 09/30/16 0522 10/04/16 2100 10/05/16 0318  NA 135 132* 134*  K 4.6 5.0 4.5  CL 103 99* 105  CO2 22 24 24   BUN 11 18 14   CREATININE 0.93 0.83 0.82  GLUCOSE 103* 115* 102*    Electrolytes  Recent Labs Lab 09/30/16 0522 10/04/16 2100 10/05/16 0318  CALCIUM 8.9 8.2* 7.3*    CBC  Recent Labs Lab  10/05/16 0853 10/05/16 1629 10/06/16 0009  WBC 4.9 3.7* 3.3*  HGB 10.3* 9.9* 10.1*  HCT 31.9* 31.0* 31.4*  PLT 51* 47* 67*    Coag's  Recent Labs Lab 10/04/16 2100  APTT 57*  INR 1.35    Sepsis Markers  Recent Labs Lab 10/04/16 2100  10/05/16 0010 10/05/16 0318 10/05/16 0630  LATICACIDVEN  --   < > 0.90 1.3 1.4  PROCALCITON 0.45  --   --  3.27  --   < > = values in this interval not displayed.  ABG No results for input(s): PHART, PCO2ART, PO2ART in the last 168 hours.  Liver Enzymes  Recent Labs Lab 10/04/16 2100 10/05/16 0318  AST 81* 59*  ALT 50 41  ALKPHOS 189* 153*  BILITOT 1.0 0.7  ALBUMIN 2.5* 2.1*    Cardiac Enzymes  Recent Labs Lab 10/04/16 2100  TROPONINI <0.03    Glucose  Recent Labs Lab 10/05/16 1056 10/06/16 0746  GLUCAP 77 127*    Imaging No results found.   STUDIES:  CT abdomen 10/23- IMPRESSION: 1. Nodular liver contour or consistent with cirrhosis. Findings consistent with portal hypertension including marked splenomegaly and probable esophageal varices. 2. Small to moderate volume of ascites, decreased compared to prior. 3. Distal esophageal thickening could relate to esophagitis. 4. Dilated gallbladder but decreased compared to prior. No intra or extrahepatic biliary dilatation. 5. Nonobstructing stone in the right  kidney.  CULTURES: BCx2 10/22 >> yeast in anaerobic bottle only >>   ANTIBIOTICS: Vancomycin 10/23 >> 10/24 Zosyn 10/23 >> 10/24 Rocephin 10/24 >>  Diflucan 10/23 >>   SIGNIFICANT EVENTS: 10/22  Admit with fever, n/v, hematemesis, melena  LINES/TUBES:   DISCUSSION: 45 y/o M admitted 10/22 with possible SBP and hypotension.    ASSESSMENT / PLAN:  PULMONARY A: No acute issues Hx Empyema s/p Thoracotomy  P:   Pulmonary hygiene - IS, mobilize Intermittent CXR to monitor for volume overload / effusions  CARDIOVASCULAR A:  Hypotension Severe Sepsis - s/p 5L volume resuscitation P:  Monitor  hemodynamics  NS @ 50 ml/hr  Albumin x4 doses, to complete 10/23  RENAL A:   Hyponatremia Elevated Lactic Acid - suspect poor clearance with liver dysfunction  P:   Trend BMP / UOP  Replace electrolytes as indicated   GASTROINTESTINAL A:   Subjective hematemesis/melena Cirrhotic Liver Disease Portal Hypertension  Large Volume Ascites Hx Gastric Ulcer Hx Polysubstance abuse / IVDA P:   PPI BID  GI following, appreciate input  GI planning scope later in week (thursday) Lactulose QD  HEMATOLOGIC A:   Thrombocytopenia Anemia P:  Trend CBC  SCD's for DVT prophylaxis   INFECTIOUS A:   SBP? Fungemia - 1/2 BC + for yeast, final results pending P:   ABX as above Diflucan as above Monitor fever curve / WBC  ENDOCRINE A:   No acute issues   P:   Monitor glucose on BMP At risk for hypoglycemia with liver dysnfuction  NEUROLOGIC A:   Pain  P:   PRN fentanyl for pain  FAMILY  - Updates: patient updated at bedside on plan of care.    - Inter-disciplinary family meet or Palliative Care meeting due by:   Transfer to medical floor with telemetry.  Back to [facility] 10/25.   Canary BrimBrandi Ollis, NP-C [facility] Pgr: [phone] or if no answer [phone] 10/06/2016, 9:06 AM  Attending Note:  45 year old male with IVDA and etoh history resulting in liver failure presenting to the ICU for hypotension.  Varices with upper GI bleeding.  GI following.  On exam, ascites noted but lungs are clear.  I reviewed CXR myself, low volumes but no infiltrate noted.  Discussed with [facility]-MD and PCCM-NP.  Hypotension: cultures as above.  - Abx  - F/u on cultures  - IVF resuscitation.  Fungemia:   - Fluconazole ordered.  Ascites and ?SBP:  - Add rocephin  - F/U on cultures  UGI Bleeding:  - GI to scope  - Diet per GI  [facility].  Patient seen and examined, agree with above note.  I dictated the care and orders  written for this patient under my direction.  Alyson ReedyWesam G Ismeal Heider, MD [phone]

## 2016-10-06 NOTE — Progress Notes (Signed)
eLink Physician-Brief Progress Note Patient Name: Dan [name] DOB: 12-Jun-1971 MRN: [id number]   Date of Service  10/06/2016  HPI/Events of Note  Candidemia  eICU Interventions  Discussed with pharmacy Start diflucan Will need ID consult (automatic consult per pharmacy)        Alvino Lechuga 10/06/2016, 6:40 AM

## 2016-10-07 ENCOUNTER — Inpatient Hospital Stay (HOSPITAL_COMMUNITY): Payer: Medicaid Other

## 2016-10-07 DIAGNOSIS — B377 Candidal sepsis: Principal | ICD-10-CM

## 2016-10-07 DIAGNOSIS — R7881 Bacteremia: Secondary | ICD-10-CM

## 2016-10-07 LAB — BODY FLUID CELL COUNT WITH DIFFERENTIAL
Lymphs, Fluid: 33 %
Monocyte-Macrophage-Serous Fluid: 50 % (ref 50–90)
NEUTROPHIL FLUID: 17 % (ref 0–25)
Total Nucleated Cell Count, Fluid: 457 cu mm (ref 0–1000)

## 2016-10-07 LAB — BASIC METABOLIC PANEL
Anion gap: 7 (ref 5–15)
BUN: 12 mg/dL (ref 6–20)
CHLORIDE: 109 mmol/L (ref 101–111)
CO2: 21 mmol/L — AB (ref 22–32)
CREATININE: 0.79 mg/dL (ref 0.61–1.24)
Calcium: 8.1 mg/dL — ABNORMAL LOW (ref 8.9–10.3)
GFR calc Af Amer: >60 mL/min (ref >60)
GFR calc non Af Amer: >60 mL/min (ref >60)
GLUCOSE: 128 mg/dL — AB (ref 65–99)
POTASSIUM: 4.3 mmol/L (ref 3.5–5.1)
Sodium: 137 mmol/L (ref 135–145)

## 2016-10-07 LAB — GRAM STAIN

## 2016-10-07 LAB — CBC
HEMATOCRIT: 34.1 % — AB (ref 39.0–52.0)
Hemoglobin: 11.2 g/dL — ABNORMAL LOW (ref 13.0–17.0)
MCH: 29.9 pg (ref 26.0–34.0)
MCHC: 32.8 g/dL (ref 30.0–36.0)
MCV: 90.9 fL (ref 78.0–100.0)
PLATELETS: 56 10*3/uL — AB (ref 150–400)
RBC: 3.75 MIL/uL — ABNORMAL LOW (ref 4.22–5.81)
RDW: 14.6 % (ref 11.5–15.5)
WBC: 3.6 10*3/uL — AB (ref 4.0–10.5)

## 2016-10-07 LAB — ECHOCARDIOGRAM COMPLETE
Height: 71 in
Weight: 2627.88 oz

## 2016-10-07 LAB — GLUCOSE, CAPILLARY: Glucose-Capillary: 114 mg/dL — ABNORMAL HIGH (ref 65–99)

## 2016-10-07 MED ORDER — ALBUMIN HUMAN 25 % IV SOLN
50.0000 g | Freq: Once | INTRAVENOUS | Status: AC
  Administered 2016-10-07: 50 g via INTRAVENOUS
  Filled 2016-10-07: qty 50

## 2016-10-07 MED ORDER — FENTANYL CITRATE (PF) 100 MCG/2ML IJ SOLN
50.0000 ug | INTRAMUSCULAR | Status: DC | PRN
  Administered 2016-10-07 – 2016-10-08 (×7): 50 ug via INTRAVENOUS
  Filled 2016-10-07 (×7): qty 2

## 2016-10-07 MED ORDER — LIDOCAINE HCL 1 % IJ SOLN
INTRAMUSCULAR | Status: AC
  Filled 2016-10-07: qty 20

## 2016-10-07 NOTE — Progress Notes (Signed)
PROGRESS NOTE                                                                                                                                                                                                             Patient Demographics:    Dan [name], is a 45 y.o. male, DOB - 07/25/1971, ZOX:[id number]  Admit date - 10/04/2016   Admitting Physician Lorretta Harp, MD  Outpatient Primary MD for the patient is [name], NP  LOS - 2  Outpatient Specialists: none  Chief Complaint  Patient presents with  . Fever       Brief Narrative   45 y/o male with IVDU, hepC decompensated cirrhosis, never treated, chr hep B , upper GI bleed with large gastric ulcer, portal hypertensive gastropathy recent hospitalization for abdominal pain with ascities s/p therapeutic paracentesis x 2 ( negative for SBP), admitted 3 days after being discharged with worsened abdominal pain, fever of 103F and 2 episodes of hematemesis. Patient septic with possible SBP. Transferred to ICU . Blood cx growing candida tropicalis. Patient transferred to [facility] on 10/25.   Subjective:   C/o abdominal pain and increased distension. Afebrile.   Assessment  & Plan :    Principal Problem:   Severe Sepsis (HCC) Received 5 L fluid for volume resuscitation. holding lasix and aldactone. Suspect SBP and  candida bacteremia. Repeat blood cx sent. 2d echo ordered.  empiric IV rocephin. paracentesis with 1.5 L ascitic fluid removed today, sent for cell count, gm stain and cx. IV diflucan , minimal 14 day treatment  per ID.  Active Problems:   Decompensated cirrhosis of liver (HCC) S/p paracentesis today. Gave IV albumin. Continue PPI, lactulose. Lasix and aldactone on hold due to severe hypovolemia. resume in am if BP stable.      Hematemesis Recent PUD. No further symptoms while in hospital. H&H stable. EGD planned for 10/26.   Abdominal pain Secondary to  SBP. adjust fentanyl dose.  Pancytopenia  secondary to decompensated cirrhosis  Hyponatremia, hypovolemic  resolved with volume resuscitation  Chronic hep B and hep c Needs to be stabilized first and assure adherence to treatment before deciding on treatment. Needs referral to hepatology clinic as outpt.    Code Status : full code  Family Communication  : none at bedside  Disposition Plan  : home once stable  Barriers For Discharge : active symptoms  Consults  :   PCCM  ID  [facility]  Procedures  :  paracentesis 10/25 EGD on 10/26  DVT Prophylaxis  : SCDs  Lab Results  Component Value Date   PLT 56 (L) 10/07/2016    Antibiotics  :    Anti-infectives    Start     Dose/Rate Route Frequency Ordered Stop   10/07/16 0600  fluconazole (DIFLUCAN) IVPB 400 mg     400 mg 100 mL/hr over 120 Minutes Intravenous Every 24 hours 10/06/16 0641     10/06/16 1400  cefTRIAXone (ROCEPHIN) 2 g in dextrose 5 % 50 mL IVPB     2 g 100 mL/hr over 30 Minutes Intravenous Every 24 hours 10/06/16 1224     10/06/16 1230  cefTRIAXone (ROCEPHIN) 2 g in dextrose 5 % 50 mL IVPB  Status:  Discontinued     2 g 100 mL/hr over 30 Minutes Intravenous Every 24 hours 10/06/16 1149 10/06/16 1224   10/06/16 0700  fluconazole (DIFLUCAN) IVPB 800 mg     800 mg 100 mL/hr over 240 Minutes Intravenous  Once 10/06/16 0641 10/06/16 1200   10/05/16 2200  vancomycin (VANCOCIN) IVPB 1000 mg/200 mL premix  Status:  Discontinued     1,000 mg 200 mL/hr over 60 Minutes Intravenous Every 8 hours 10/05/16 1715 10/06/16 1149   10/05/16 1500  piperacillin-tazobactam (ZOSYN) IVPB 3.375 g  Status:  Discontinued     3.375 g 12.5 mL/hr over 240 Minutes Intravenous Every 8 hours 10/05/16 1433 10/06/16 1149   10/05/16 0900  vancomycin (VANCOCIN) IVPB 1000 mg/200 mL premix  Status:  Discontinued     1,000 mg 200 mL/hr over 60 Minutes Intravenous Every 8 hours 10/05/16 0828 10/05/16 1715   10/05/16 0900   piperacillin-tazobactam (ZOSYN) IVPB 3.375 g  Status:  Discontinued     3.375 g 12.5 mL/hr over 240 Minutes Intravenous Every 8 hours 10/05/16 0828 10/05/16 1433   10/05/16 0600  piperacillin-tazobactam (ZOSYN) IVPB 3.375 g  Status:  Discontinued     3.375 g 12.5 mL/hr over 240 Minutes Intravenous Every 8 hours 10/04/16 2114 10/05/16 0301   10/05/16 0600  cefTRIAXone (ROCEPHIN) 2 g in dextrose 5 % 50 mL IVPB  Status:  Discontinued     2 g 100 mL/hr over 30 Minutes Intravenous Every 24 hours 10/05/16 0301 10/05/16 0822   10/05/16 0100  vancomycin (VANCOCIN) 1,500 mg in sodium chloride 0.9 % 500 mL IVPB     1,500 mg 250 mL/hr over 120 Minutes Intravenous  Once 10/05/16 0042 10/05/16 0315   10/04/16 2100  piperacillin-tazobactam (ZOSYN) IVPB 3.375 g     3.375 g 100 mL/hr over 30 Minutes Intravenous  Once 10/04/16 2056 10/04/16 2204        Objective:   Vitals:   10/07/16 0612 10/07/16 1055 10/07/16 1127 10/07/16 1130  BP:  125/69 110/79 114/71  Pulse:      Resp:      Temp:      TempSrc:      SpO2:      Weight: 74.5 kg (164 lb 3.9 oz)     Height:        Wt Readings from Last 3 Encounters:  10/07/16 74.5 kg (164 lb 3.9 oz)  09/30/16 72.4 kg (159 lb 9.6 oz)  07/14/16 74.3 kg (163 lb 12.8 oz)     Intake/Output Summary (Last 24 hours) at 10/07/16 1413 Last data filed at 10/07/16 [id number]  Gross  per 24 hour  Intake           833.33 ml  Output             1825 ml  Net          -991.67 ml     Physical Exam  Gen: rerstless c/o pain HEENT: pallor+, moist mucosa, supple neck Chest: clear b/l, no added sounds CVS: N S1&S2, no murmurs, rubs or gallop GI: soft, distended with protuberant umbilicus, tender diffusely Musculoskeletal: warm, no edema CNS: Alert and oriented, non focal    Data Review:    CBC  Recent Labs Lab 10/04/16 2100 10/05/16 0318 10/05/16 0853 10/05/16 1629 10/06/16 0009 10/07/16 0608  WBC 3.7* 5.6 4.9 3.7* 3.3* 3.6*  HGB 11.5* 11.1* 10.3* 9.9*  10.1* 11.2*  HCT 35.2* 33.9* 31.9* 31.0* 31.4* 34.1*  PLT 62* 48* 51* 47* 67* 56*  MCV 91.0 92.6 93.0 93.4 92.6 90.9  MCH 29.7 30.3 30.0 29.8 29.8 29.9  MCHC 32.7 32.7 32.3 31.9 32.2 32.8  RDW 14.3 14.6 14.8 14.8 14.7 14.6  LYMPHSABS 0.3*  --   --   --   --   --   MONOABS 0.1  --   --   --   --   --   EOSABS 0.0  --   --   --   --   --   BASOSABS 0.0  --   --   --   --   --     Chemistries   Recent Labs Lab 10/04/16 2100 10/05/16 0318 10/07/16 0608  NA 132* 134* 137  K 5.0 4.5 4.3  CL 99* 105 109  CO2 24 24 21*  GLUCOSE 115* 102* 128*  BUN 18 14 12   CREATININE 0.83 0.82 0.79  CALCIUM 8.2* 7.3* 8.1*  AST 81* 59*  --   ALT 50 41  --   ALKPHOS 189* 153*  --   BILITOT 1.0 0.7  --    ------------------------------------------------------------------------------------------------------------------ No results for input(s): CHOL, HDL, LDLCALC, TRIG, CHOLHDL, LDLDIRECT in the last 72 hours.  No results found for: HGBA1C ------------------------------------------------------------------------------------------------------------------ No results for input(s): TSH, T4TOTAL, T3FREE, THYROIDAB in the last 72 hours.  Invalid input(s): FREET3 ------------------------------------------------------------------------------------------------------------------ No results for input(s): VITAMINB12, FOLATE, FERRITIN, TIBC, IRON, RETICCTPCT in the last 72 hours.  Coagulation profile  Recent Labs Lab 10/04/16 2100  INR 1.35    No results for input(s): DDIMER in the last 72 hours.  Cardiac Enzymes  Recent Labs Lab 10/04/16 2100  TROPONINI <0.03   ------------------------------------------------------------------------------------------------------------------    Component Value Date/Time   BNP 144.3 (H) 07/14/2016 0640    Inpatient Medications  Scheduled Meds: . cefTRIAXone (ROCEPHIN)  IV  2 g Intravenous Q24H  . fluconazole (DIFLUCAN) IV  400 mg Intravenous Q24H  .  lactulose  20 g Oral Daily  . lidocaine      . pantoprazole  40 mg Oral BID  . sodium chloride flush  3 mL Intravenous Q12H   Continuous Infusions: . sodium chloride 50 mL/hr at 10/07/16 0050   PRN Meds:.albuterol, fentaNYL (SUBLIMAZE) injection, ondansetron (ZOFRAN) IV  Micro Results Recent Results (from the past 240 hour(s))  Gram stain     Status: None   Collection Time: 09/30/16 11:10 AM  Result Value Ref Range Status   Specimen Description PARACENTESIS  Final   Special Requests NONE  Final   Gram Stain   Final    FEW WBC PRESENT,BOTH PMN AND MONONUCLEAR NO ORGANISMS SEEN  Report Status 09/30/2016 FINAL  Final  Blood Culture (routine x 2)     Status: Abnormal (Preliminary result)   Collection Time: 10/04/16  8:10 PM  Result Value Ref Range Status   Specimen Description BLOOD RIGHT FOREARM  Final   Special Requests BOTTLES DRAWN AEROBIC AND ANAEROBIC 5CC EA  Final   Culture  Setup Time   Final    YEAST AEROBIC BOTTLE ONLY CRITICAL RESULT CALLED TO, READ BACK BY AND VERIFIED WITH: CARON AMEND,PHARMD @0630  10/06/16 MKELLY,MLT    Culture YEAST CULTURE REINCUBATED FOR BETTER GROWTH  (A)  Final   Report Status PENDING  Incomplete  Blood Culture ID Panel (Reflexed)     Status: Abnormal   Collection Time: 10/04/16  8:10 PM  Result Value Ref Range Status   Enterococcus species NOT DETECTED NOT DETECTED Final   Listeria monocytogenes NOT DETECTED NOT DETECTED Final   Staphylococcus species NOT DETECTED NOT DETECTED Final   Staphylococcus aureus NOT DETECTED NOT DETECTED Final   Streptococcus species NOT DETECTED NOT DETECTED Final   Streptococcus agalactiae NOT DETECTED NOT DETECTED Final   Streptococcus pneumoniae NOT DETECTED NOT DETECTED Final   Streptococcus pyogenes NOT DETECTED NOT DETECTED Final   Acinetobacter baumannii NOT DETECTED NOT DETECTED Final   Enterobacteriaceae species NOT DETECTED NOT DETECTED Final   Enterobacter cloacae complex NOT DETECTED NOT  DETECTED Final   Escherichia coli NOT DETECTED NOT DETECTED Final   Klebsiella oxytoca NOT DETECTED NOT DETECTED Final   Klebsiella pneumoniae NOT DETECTED NOT DETECTED Final   Proteus species NOT DETECTED NOT DETECTED Final   Serratia marcescens NOT DETECTED NOT DETECTED Final   Haemophilus influenzae NOT DETECTED NOT DETECTED Final   Neisseria meningitidis NOT DETECTED NOT DETECTED Final   Pseudomonas aeruginosa NOT DETECTED NOT DETECTED Final   Candida albicans NOT DETECTED NOT DETECTED Final   Candida glabrata NOT DETECTED NOT DETECTED Final   Candida krusei NOT DETECTED NOT DETECTED Final   Candida parapsilosis NOT DETECTED NOT DETECTED Final   Candida tropicalis DETECTED (A) NOT DETECTED Final    Comment: CRITICAL RESULT CALLED TO, READ BACK BY AND VERIFIED WITH: CARON AMEND,PHARMD @0630  10/06/16 MKELLY,MLT   Blood Culture (routine x 2)     Status: Abnormal (Preliminary result)   Collection Time: 10/04/16  9:00 PM  Result Value Ref Range Status   Specimen Description BLOOD LEFT ANTECUBITAL  Final   Special Requests BOTTLES DRAWN AEROBIC AND ANAEROBIC 5CC EA  Final   Culture  Setup Time   Final    YEAST ANAEROBIC BOTTLE ONLY CRITICAL RESULT CALLED TO, READ BACK BY AND VERIFIED WITH: CARON AMEND,PHARMD @0630  10/06/16 MKELLY,MLT    Culture CANDIDA TROPICALIS (A)  Final   Report Status PENDING  Incomplete  Urine culture     Status: None   Collection Time: 10/04/16  9:59 PM  Result Value Ref Range Status   Specimen Description URINE, RANDOM  Final   Special Requests NONE  Final   Culture NO GROWTH  Final   Report Status 10/06/2016 FINAL  Final  MRSA PCR Screening     Status: None   Collection Time: 10/05/16 10:45 AM  Result Value Ref Range Status   MRSA by PCR NEGATIVE NEGATIVE Final    Comment:        The GeneXpert MRSA Assay (FDA approved for NASAL specimens only), is one component of a comprehensive MRSA colonization surveillance program. It is not intended to  diagnose MRSA infection nor to guide or monitor treatment for  MRSA infections.     Radiology Reports Ct Abdomen Pelvis W Contrast  Result Date: 10/05/2016 CLINICAL DATA:  Abdominal pain and swelling, vomiting blood recent paracentesis EXAM: CT ABDOMEN AND PELVIS WITH CONTRAST TECHNIQUE: Multidetector CT imaging of the abdomen and pelvis was performed using the standard protocol following bolus administration of intravenous contrast. CONTRAST:  ISOVUE-300 IOPAMIDOL (ISOVUE-300) INJECTION 61% COMPARISON:  09/30/2016, 07/14/2016 FINDINGS: Lower chest: Patchy dependent atelectasis. Nodular atelectasis or scarring in the posterior right lung base. No effusion. Heart size normal. No large pericardial effusion. Hepatobiliary: Nodular contour of the liver consistent with cirrhosis. Heterogenous liver enhancement but without discrete mass. Portal veins and splenic vein appear patent. The gallbladder is dilated but decreased compared to prior. No intra or extrahepatic biliary dilatation. Pancreas: Unremarkable. No pancreatic ductal dilatation or surrounding inflammatory changes. Spleen: Markedly enlarged at 20 cm. Adrenals/Urinary Tract: Adrenal glands are within normal limits. Small calcified stone in the mid to upper right kidney. No hydronephrosis. Bladder is normal. Stomach/Bowel: The stomach is collapsed. There is distal esophageal thickening. No dilated large or small bowel. Probable esophageal varices. Vascular/Lymphatic: Aortic caliber is normal. There is atherosclerosis present. No grossly enlarged lymph nodes. Reproductive: Prostate gland slightly enlarged with few calcifications. Other: No free air. Small to moderate ascites, decreased compared to comparison exam. Musculoskeletal: No acute osseous abnormality. Degenerative changes of the spine with gas within the disc space and posterior to the L4 vertebral body. IMPRESSION: 1. Nodular liver contour or consistent with cirrhosis. Findings consistent  with portal hypertension including marked splenomegaly and probable esophageal varices. 2. Small to moderate volume of ascites, decreased compared to prior. 3. Distal esophageal thickening could relate to esophagitis. 4. Dilated gallbladder but decreased compared to prior. No intra or extrahepatic biliary dilatation. 5. Nonobstructing stone in the right kidney. Electronically Signed   By: Jasmine Pang M.D.   On: 10/05/2016 01:51   US Paracentesis  Result Date: 10/07/2016 INDICATION: Ascites secondary to alcoholic cirrhosis. Chronic hepatitis B and C. Request for therapeutic paracentesis. EXAM: ULTRASOUND GUIDED RIGHT LATERAL ABDOMEN PARACENTESIS MEDICATIONS: 1% Lidocaine. COMPLICATIONS: None immediate. PROCEDURE: Informed written consent was obtained from the patient after a discussion of the risks, benefits and alternatives to treatment. A timeout was performed prior to the initiation of the procedure. Initial ultrasound scanning demonstrates a moderate amount of ascites within the right lateral abdomen. The right lateral abdomen was prepped and draped in the usual sterile fashion. 1% lidocaine with epinephrine was used for local anesthesia. Following this, a 6 Fr Safe-T-Centesis catheter was introduced. An ultrasound image was saved for documentation purposes. The paracentesis was performed. The catheter was removed and a dressing was applied. The patient tolerated the procedure well without immediate post procedural complication. FINDINGS: A total of approximately 1.45 liters of clear yellow fluid was removed. Samples were sent to the laboratory as requested by the clinical team. IMPRESSION: Successful ultrasound-guided paracentesis yielding 1.45 liters of peritoneal fluid. Read by:  Corrin Parker, PA-C Electronically Signed   By: Jolaine Click M.D.   On: 10/07/2016 13:26   US Paracentesis  Result Date: 09/30/2016 INDICATION: Cirrhosis, hepatitis B/C, recurrent ascites. Request made for diagnostic and  therapeutic paracentesis. EXAM: ULTRASOUND GUIDED DIAGNOSTIC AND THERAPEUTIC PARACENTESIS MEDICATIONS: None. COMPLICATIONS: None immediate. PROCEDURE: Informed written consent was obtained from the patient after a discussion of the risks, benefits and alternatives to treatment. A timeout was performed prior to the initiation of the procedure. Initial ultrasound scanning demonstrates a small amount of ascites within the left lower  abdominal quadrant. The left lower abdomen was prepped and draped in the usual sterile fashion. 1% lidocaine was used for local anesthesia. Following this, a Yueh catheter was introduced. An ultrasound image was saved for documentation purposes. The paracentesis was performed. The catheter was removed and a dressing was applied. The patient tolerated the procedure well without immediate post procedural complication. FINDINGS: A total of approximately 1.3 liters of slightly hazy, light yellow fluid was removed. Samples were sent to the laboratory as requested by the clinical team. IMPRESSION: Successful ultrasound-guided diagnostic and therapeutic paracentesis yielding 1.3 liters liters of peritoneal fluid. Read by: Jeananne Rama, PA-C Electronically Signed   By: Malachy Moan M.D.   On: 09/30/2016 11:25   US Paracentesis  Result Date: 09/27/2016 INDICATION: Ascites secondary to alcoholic cirrhosis. Chronic hepatitis B and C. Request for therapeutic paracentesis. EXAM: ULTRASOUND GUIDED RIGHT LATERAL ABDOMEN PARACENTESIS MEDICATIONS: 1% Lidocaine. COMPLICATIONS: None immediate. PROCEDURE: Informed written consent was obtained from the patient after a discussion of the risks, benefits and alternatives to treatment. A timeout was performed prior to the initiation of the procedure. Initial ultrasound scanning demonstrates a large amount of ascites within the right lateral abdomen. The right lateral abdomen was prepped and draped in the usual sterile fashion. 1% lidocaine with  epinephrine was used for local anesthesia. Following this, a 19 gauge, 7-cm, Yueh catheter was introduced. An ultrasound image was saved for documentation purposes. The paracentesis was performed. The catheter was removed and a dressing was applied. The patient tolerated the procedure well without immediate post procedural complication. FINDINGS: A total of approximately 5 liters of clear yellow fluid was removed. IMPRESSION: Successful ultrasound-guided paracentesis yielding 5 liters of peritoneal fluid. Read by:  Corrin Parker, PA-C Electronically Signed   By: Gilmer Mor D.O.   On: 09/27/2016 10:04   Dg Chest Port 1 View  Result Date: 10/04/2016 CLINICAL DATA:  Sepsis. EXAM: PORTABLE CHEST 1 VIEW COMPARISON:  09/25/2016 FINDINGS: Cardiomediastinal silhouette is within normal limits. Diffuse interstitial coarsening is unchanged and compatible with chronic lung disease. There is no evidence of acute airspace consolidation, overt edema, pleural effusion, or pneumothorax. No acute osseous abnormality is seen. IMPRESSION: No active disease. Electronically Signed   By: Sebastian Ache M.D.   On: 10/04/2016 22:25   Dg Chest Port 1 View  Result Date: 09/25/2016 CLINICAL DATA:  Dyspnea, cough, abdominal pain EXAM: PORTABLE CHEST 1 VIEW COMPARISON:  09/11/2016 FINDINGS: There are low lung volumes. There is bilateral diffuse interstitial thickening likely chronic. There is no focal parenchymal opacity. There is no pleural effusion or pneumothorax. The heart and mediastinal contours are unremarkable. The osseous structures are unremarkable. IMPRESSION: No active cardiopulmonary disease. Electronically Signed   By: Elige Ko   On: 09/25/2016 10:27   Dg Abd Portable 1v  Result Date: 09/27/2016 CLINICAL DATA:  Abdominal pain.  Paracentesis today EXAM: PORTABLE ABDOMEN - 1 VIEW COMPARISON:  None. FINDINGS: The bowel gas pattern is normal. No radio-opaque calculi or other significant radiographic abnormality are  seen. IMPRESSION: Negative. These results will be called to the ordering clinician or representative by the Radiologist Assistant, and communication documented in the PACS or zVision Dashboard. Electronically Signed   By: Marlan Palau M.D.   On: 09/27/2016 18:55    Time Spent in minutes 35   Eddie North M.D on 10/07/2016 at 2:13 PM  Between 7am to 7pm - Pager - [phone]  After 7pm go to [email or web address] - password [email or web address]  [facility] -  Office  [phone]     

## 2016-10-07 NOTE — Evaluation (Signed)
Physical Therapy Evaluation Patient Details Name: ABRAN GAVIGAN MRN: [id number] DOB: Dec 23, 1970 Today's Date: 10/07/2016   History of Present Illness  pt is a 45 y/o male with h/o cirrhosis, Hep B and C, polysubstance abuse, GI bleeding, tobacco abuse and chronic thrombocytopenia, admitted with nausea vomiting, abdominal pain, hematemesis and rectal bleed  Clinical Impression  Pt admitted with/for abdominal pain.  Pt currently limited functionally due to the problems listed below.  (see problems list.)  Pt will benefit from PT to maximize function and safety to be able to get home safely with available assist of mother.     Follow Up Recommendations Home health PT    Equipment Recommendations       Recommendations for Other Services       Precautions / Restrictions Precautions Precautions: None                 Mobility  Bed Mobility Overal bed mobility: Modified Independent                Transfers Overall transfer level: Modified independent                  Ambulation/Gait Ambulation/Gait assistance: Supervision Ambulation Distance (Feet): 180 Feet Assistive device: None Gait Pattern/deviations: Step-through pattern   Gait velocity interpretation: at or above normal speed for age/gender General Gait Details: generally steady and age appropriate speed, but limited by abdominal pain.  Stairs            Wheelchair Mobility    Modified Rankin (Stroke Patients Only)       Balance                                             Pertinent Vitals/Pain Pain Assessment: 0-10 Pain Score: 8  Pain Location: stomach Pain Descriptors / Indicators: Discomfort;Grimacing;Sharp;Tender Pain Intervention(s): Monitored during session;Repositioned    Home Living Family/patient expects to be discharged to:: Private residence Living Arrangements: Parent (patient states Mom lives next door.) Available Help at Discharge: Available  PRN/intermittently;Family Type of Home: Mobile home Home Access: Stairs to enter Entrance Stairs-Rails: Doctor, general practice of Steps: 8 Home Layout: One level Home Equipment: None      Prior Function Level of Independence: Independent               Hand Dominance        Extremity/Trunk Assessment   Upper Extremity Assessment: Overall WFL for tasks assessed           Lower Extremity Assessment: Overall WFL for tasks assessed (mild proximal weakness/hip flexors, lower trunk)         Communication   Communication: No difficulties  Cognition Arousal/Alertness: Awake/alert Behavior During Therapy: WFL for tasks assessed/performed Overall Cognitive Status: Within Functional Limits for tasks assessed                      General Comments      Exercises     Assessment/Plan    PT Assessment Patient needs continued PT services  PT Problem List Decreased strength;Decreased activity tolerance;Decreased balance;Decreased mobility;Pain          PT Treatment Interventions Gait training;Functional mobility training;Therapeutic activities;Balance training;Patient/family education    PT Goals (Current goals can be found in the Care Plan section)  Acute Rehab PT Goals Patient Stated Goal: back able to do for myself  and decrease this pain. PT Goal Formulation: With patient Time For Goal Achievement: 10/21/16 Potential to Achieve Goals: Good    Frequency Min 3X/week   Barriers to discharge        Co-evaluation               End of Session   Activity Tolerance: Patient tolerated treatment well;Patient limited by pain Patient left: in bed;with call bell/phone within reach Nurse Communication: Mobility status         Time: [id number] PT Time Calculation (min) (ACUTE ONLY): 17 min   Charges:   PT Evaluation $PT Eval Moderate Complexity: 1 Procedure     PT G Codes:        Blasa Raisch, Eliseo GumKenneth V 10/07/2016, 5:51  PM  10/07/2016  Webster BingKen Alexandrea Westergard, PT [phone]  (pager)

## 2016-10-07 NOTE — Progress Notes (Signed)
Echocardiogram 2D Echocardiogram has been performed.  Dan [name] 10/07/2016, 3:11 PM

## 2016-10-07 NOTE — Progress Notes (Signed)
Pt out of room.  In ultrasound for paracentesis and SBP studies. Set for EGD on 10/26 ~ 0845. Hgb improved.  Jennye MoccasinSarah Dijon Kohlman PA-C.

## 2016-10-07 NOTE — Procedures (Signed)
Successful US guided paracentesis from right lateral abdomen.  Yielded 1.45 liters of clear yellow fluid.  No immediate complications.  Pt tolerated well.   Specimen was sent for labs.  WENDY S BLAIR PA-C 10/07/2016 1:27 PM

## 2016-10-08 ENCOUNTER — Encounter (HOSPITAL_COMMUNITY): Payer: Self-pay

## 2016-10-08 ENCOUNTER — Inpatient Hospital Stay (HOSPITAL_COMMUNITY): Payer: Medicaid Other | Admitting: Certified Registered"

## 2016-10-08 ENCOUNTER — Encounter (HOSPITAL_COMMUNITY)
Admission: EM | Discharge status: Against Medical Advice | Payer: Self-pay | Source: Home / Self Care | Attending: Internal Medicine

## 2016-10-08 DIAGNOSIS — K3189 Other diseases of stomach and duodenum: Secondary | ICD-10-CM

## 2016-10-08 DIAGNOSIS — R1030 Lower abdominal pain, unspecified: Secondary | ICD-10-CM

## 2016-10-08 DIAGNOSIS — R1013 Epigastric pain: Secondary | ICD-10-CM

## 2016-10-08 DIAGNOSIS — B377 Candidal sepsis: Secondary | ICD-10-CM

## 2016-10-08 HISTORY — PX: ESOPHAGOGASTRODUODENOSCOPY: SHX5428

## 2016-10-08 LAB — CULTURE, BLOOD (ROUTINE X 2)

## 2016-10-08 LAB — GLUCOSE, CAPILLARY: Glucose-Capillary: 101 mg/dL — ABNORMAL HIGH (ref 65–99)

## 2016-10-08 SURGERY — EGD (ESOPHAGOGASTRODUODENOSCOPY)
Anesthesia: Monitor Anesthesia Care

## 2016-10-08 MED ORDER — PROPOFOL 500 MG/50ML IV EMUL
INTRAVENOUS | Status: DC | PRN
  Administered 2016-10-08: 150 ug/kg/min via INTRAVENOUS

## 2016-10-08 MED ORDER — SODIUM CHLORIDE 0.9 % IV SOLN: INTRAVENOUS | Status: DC

## 2016-10-08 MED ORDER — PROPOFOL 10 MG/ML IV BOLUS
INTRAVENOUS | Status: DC | PRN
  Administered 2016-10-08: 30 mg via INTRAVENOUS
  Administered 2016-10-08 (×2): 20 mg via INTRAVENOUS

## 2016-10-08 MED ORDER — PROMETHAZINE HCL 25 MG/ML IJ SOLN: 6.2500 mg | INTRAMUSCULAR | Status: DC | PRN

## 2016-10-08 MED ORDER — LIDOCAINE 2% (20 MG/ML) 5 ML SYRINGE
INTRAMUSCULAR | Status: DC | PRN
Start: 2016-10-08 — End: 2016-10-08
  Administered 2016-10-08: 40 mg via INTRAVENOUS

## 2016-10-08 MED ORDER — MEPERIDINE HCL 25 MG/ML IJ SOLN: 6.2500 mg | INTRAMUSCULAR | Status: DC | PRN

## 2016-10-08 MED ORDER — MIDAZOLAM HCL 2 MG/2ML IJ SOLN: 0.5000 mg | Freq: Once | INTRAMUSCULAR | Status: DC | PRN

## 2016-10-08 MED ORDER — FENTANYL CITRATE (PF) 100 MCG/2ML IJ SOLN: 25.0000 ug | INTRAMUSCULAR | Status: DC | PRN

## 2016-10-08 MED ORDER — BUTAMBEN-TETRACAINE-BENZOCAINE 2-2-14 % EX AERO
INHALATION_SPRAY | CUTANEOUS | Status: DC | PRN
  Administered 2016-10-08: 2 via TOPICAL

## 2016-10-08 MED ORDER — LACTATED RINGERS IV SOLN: INTRAVENOUS | Status: DC

## 2016-10-08 NOTE — Anesthesia Postprocedure Evaluation (Signed)
Anesthesia Post Note  Patient: Dorise Bullionony W Campos  Procedure(s) Performed: Procedure(s) (LRB): ESOPHAGOGASTRODUODENOSCOPY (EGD) (N/A)  Patient location during evaluation: Endoscopy Anesthesia Type: MAC Level of consciousness: awake and alert, oriented and patient cooperative Pain management: pain level controlled Vital Signs Assessment: post-procedure vital signs reviewed and stable Respiratory status: spontaneous breathing, nonlabored ventilation and respiratory function stable Cardiovascular status: blood pressure returned to baseline and stable Postop Assessment: no signs of nausea or vomiting Anesthetic complications: no    Last Vitals:  Vitals:   10/08/16 1153 10/08/16 1200  BP: 130/75 124/71  Pulse: (!) 37 (!) 33  Resp: (!) 21 (!) 21  Temp: 36.8 C     Last Pain:  Vitals:   10/08/16 1225  TempSrc:   PainSc: 8                  Cailean Heacock,E. Mahalia Dykes

## 2016-10-08 NOTE — H&P (View-Only) (Signed)
Pt out of room.  In ultrasound for paracentesis and SBP studies. Set for EGD on 10/26 ~ 0845. Hgb improved.  Alejo Beamer PA-C.   

## 2016-10-08 NOTE — Progress Notes (Signed)
Pt left AMA. Pt stated he needed to go now. MD called and requested Pt to stay. Pt aware that he has an active infection of Sepsis, presumed sbp with associated candidemia, likely need a minimum of 14 day of treatment per Infectious Disease. Pt was asked to stay to get Picc line placed so treatment can be continued. Pt declined all care offered and signed AMA form and left unit with belongings. MD aware.

## 2016-10-08 NOTE — Discharge Summary (Signed)
AMA NOTE    Patient ID: Dan [name] MRN: [id number] DOB/AGE: 01-11-1971 45 y.o.  Admit date: 10/04/2016 Discharge date: 10/08/2016   PLEASE NOTE THAT PATIENT LEFT AGAINST MEDICAL ADVICE. Risks of Bacteremia, sepsis, death were explained in detail to the patient and he verbally understood the risks of leaving AMA.   Primary Care Physician:  [name], NP  Discharge Diagnoses:      Sepsis with Candida bacteremia . SBP (spontaneous bacterial peritonitis) (HCC) . Abdominal pain . Ascites . Hematemesis .  cirrhosis of liver (HCC) . Nausea & vomiting . Sepsis (HCC) . GIB (gastrointestinal bleeding) . 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 Patient septic with possible SBP. Transferred to ICU . Blood cultures grew candida tropicalis. Patient transferred to [facility] on 10/25.  Hospital Course:    Severe Sepsis (HCC) with SBP and Candida bacteremia Patient had a recent hospitalization with decompensated cirrhosis with ascites, at that time was ruled out for SBP and was discharged on October 19. He was readmitted on October 22 for worsening abdominal pain, fevers 103 and 2 episodes of hematemesis. He also had reported that he did not fill his discharge  medication prescriptions.  Patient was transferred to ICU during this admission. He received 5 L fluid for volume resuscitation. Lasix and Aldactone were held. Blood cultures showed Candida tropicalis holding lasix and aldactone. Repeat blood cultures so far negative. Infectious disease was consulted and recommended IV Rocephin, 2 g IV daily for SBP for 7 days then weekly SBP prophylaxis, IV fluconazole for 14 days. 2-D echo showed EF of 60-65%, no Conley motion abnormalities or vegetations.  Patient underwent endoscopy on 10/26, which was clear. After the endoscopy, patient demanded to leave. He was recommended PICC line for IV antibiotics given his Candida bacteremia and severe sepsis as recommended by infectious disease. Patient declined to stay in the hospital for any further care and stated that he needed to take care of the stuff at home.     Decompensated cirrhosis of liver (HCC)  patient underwent paracentesis and received IV albumin. Continue PPI, lactulose. Lasix and aldactone were initially on hold due to severe hypovolemia.    Hematemesis Recent PUD. No further symptoms while in hospital. H&H stable. Patient underwent EGD which showed no varices, mild portal hypertensive gastropathy. He was recommended to continue PPI.  Abdominal pain Secondary to SBP  Pancytopenia-  secondary to decompensated cirrhosis  Hyponatremia, hypovolemic- resolved with volume resuscitation  Chronic hep B and hep c Needs to be stabilized first and assure adherence to treatment before deciding on treatment. Needs referral to hepatology clinic as outpt.   Day of Discharge BP 124/71   Pulse (!) 33   Temp 98.2 F (36.8 C) (Oral)   Resp (!) 21   Ht 5\' 11"  (1.803 m)  Wt 72.6 kg (160 lb 0.9 oz)   SpO2 99%   BMI 22.32 kg/m   Physical Exam: Patient was seen and examined earlier this morning prior to his leaving AMA. At that time he did not have any complaints except abdominal pain. No fevers or  chills, no nausea or vomiting or chest pain. General: Alert and awake oriented x3 not in any acute distress. HEENT: anicteric sclera, pupils reactive to light and accommodation CVS: S1-S2 clear no murmur rubs or gallops Chest: clear to auscultation bilaterally, no wheezing rales or rhonchi Abdomen: soft  mild tenderness diffusely, nondistended, normal bowel sounds Extremities: no cyanosis, clubbing or edema noted bilaterally Neuro: Cranial nerves II-XII intact, no focal neurological deficits   The results of significant diagnostics from this hospitalization (including imaging, microbiology, ancillary and laboratory) are listed below for reference.    LAB RESULTS: Basic Metabolic Panel:  Recent Labs Lab 10/05/16 0318 10/07/16 0608  NA 134* 137  K 4.5 4.3  CL 105 109  CO2 24 21*  GLUCOSE 102* 128*  BUN 14 12  CREATININE 0.82 0.79  CALCIUM 7.3* 8.1*   Liver Function Tests:  Recent Labs Lab 10/04/16 2100 10/05/16 0318  AST 81* 59*  ALT 50 41  ALKPHOS 189* 153*  BILITOT 1.0 0.7  PROT 6.7 5.6*  ALBUMIN 2.5* 2.1*    Recent Labs Lab 10/04/16 2100 10/05/16 0853  LIPASE 35 31  AMYLASE  --  52   No results for input(s): AMMONIA in the last 168 hours. CBC:  Recent Labs Lab 10/04/16 2100  10/06/16 0009 10/07/16 0608  WBC 3.7*  < > 3.3* 3.6*  NEUTROABS 3.2  --   --   --   HGB 11.5*  < > 10.1* 11.2*  HCT 35.2*  < > 31.4* 34.1*  MCV 91.0  < > 92.6 90.9  PLT 62*  < > 67* 56*  < > = values in this interval not displayed. Cardiac Enzymes:  Recent Labs Lab 10/04/16 2100  CKTOTAL 34*  TROPONINI <0.03   BNP: Invalid input(s): POCBNP CBG:  Recent Labs Lab 10/07/16 0812 10/08/16 0757  GLUCAP 114* 101*    Significant Diagnostic Studies:  Ct Abdomen Pelvis W Contrast  Result Date: 10/05/2016 CLINICAL DATA:  Abdominal pain and swelling, vomiting blood recent paracentesis EXAM: CT ABDOMEN AND PELVIS WITH CONTRAST TECHNIQUE: Multidetector CT imaging of the  abdomen and pelvis was performed using the standard protocol following bolus administration of intravenous contrast. CONTRAST:  ISOVUE-300 IOPAMIDOL (ISOVUE-300) INJECTION 61% COMPARISON:  09/30/2016, 07/14/2016 FINDINGS: Lower chest: Patchy dependent atelectasis. Nodular atelectasis or scarring in the posterior right lung base. No effusion. Heart size normal. No large pericardial effusion. Hepatobiliary: Nodular contour of the liver consistent with cirrhosis. Heterogenous liver enhancement but without discrete mass. Portal veins and splenic vein appear patent. The gallbladder is dilated but decreased compared to prior. No intra or extrahepatic biliary dilatation. Pancreas: Unremarkable. No pancreatic ductal dilatation or surrounding inflammatory changes. Spleen: Markedly enlarged at 20 cm. Adrenals/Urinary Tract: Adrenal glands are within normal limits. Small calcified stone in the mid to upper right kidney. No hydronephrosis. Bladder is normal. Stomach/Bowel: The stomach is collapsed. There is distal esophageal thickening. No dilated large or small bowel. Probable esophageal varices. Vascular/Lymphatic: Aortic caliber is normal. There is atherosclerosis present. No grossly enlarged lymph nodes. Reproductive: Prostate gland slightly enlarged with few calcifications. Other: No free air. Small to moderate ascites, decreased compared to comparison exam. Musculoskeletal: No acute osseous abnormality. Degenerative changes of the  spine with gas within the disc space and posterior to the L4 vertebral body. IMPRESSION: 1. Nodular liver contour or consistent with cirrhosis. Findings consistent with portal hypertension including marked splenomegaly and probable esophageal varices. 2. Small to moderate volume of ascites, decreased compared to prior. 3. Distal esophageal thickening could relate to esophagitis. 4. Dilated gallbladder but decreased compared to prior. No intra or extrahepatic biliary dilatation. 5.  Nonobstructing stone in the right kidney. Electronically Signed   By: Jasmine PangKim  Fujinaga M.D.   On: 10/05/2016 01:51   Dg Chest Port 1 View  Result Date: 10/04/2016 CLINICAL DATA:  Sepsis. EXAM: PORTABLE CHEST 1 VIEW COMPARISON:  09/25/2016 FINDINGS: Cardiomediastinal silhouette is within normal limits. Diffuse interstitial coarsening is unchanged and compatible with chronic lung disease. There is no evidence of acute airspace consolidation, overt edema, pleural effusion, or pneumothorax. No acute osseous abnormality is seen. IMPRESSION: No active disease. Electronically Signed   By: Sebastian AcheAllen  Grady M.D.   On: 10/04/2016 22:25    2D ECHO:   Disposition and Follow-up:    DISPOSITION: Patient left AMA. He/ she was advised to seek follow-up with primary care physician.     DISCHARGE FOLLOW-UP  None  as patient left AMA     Signed:   RAI,RIPUDEEP M.D. [facility] 10/08/2016, 1:45 PM Pager: [phone]

## 2016-10-08 NOTE — Interval H&P Note (Signed)
History and Physical Interval Note:  10/08/2016 11:14 AM  Dan [name]  has presented today for surgery, with the diagnosis of large gastric ulcer in EGD earlier this year.  The various methods of treatment have been discussed with the patient and family. After consideration of risks, benefits and other options for treatment, the patient has consented to  Procedure(s): ESOPHAGOGASTRODUODENOSCOPY (EGD) (N/A) as a surgical intervention .  The patient's history has been reviewed, patient examined, no change in status, stable for surgery.  I have reviewed the patient's chart and labs.  Questions were answered to the patient's satisfaction.     [name]

## 2016-10-08 NOTE — Progress Notes (Signed)
Pt out of room- will recheck on pt later as schedule allows. [place] CreekLori Ericah Scot[place], [place] [phone]

## 2016-10-08 NOTE — Op Note (Signed)
[facility] Patient Name: Dan [name] Procedure Date : 10/08/2016 MRN: [id number] Attending MD: Starr LakeHenry L. 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 Myrtie Neitheranis, MD, Priscella MannAutumn Goldsmith, RN, Arlee Muslimhris                            Chandler Tech., Technician, Merril Abbeavid Butler, CRNA Referring MD:              Medicines:                Monitored Anesthesia Care Complications:            No immediate complications. Estimated Blood Loss:     Estimated blood loss: none. Procedure:                Pre-Anesthesia Assessment:                           - Prior to the procedure, a History and Physical                            was performed, and patient medications and                            allergies were reviewed. The patient's tolerance of                            previous anesthesia was also reviewed. The risks                            and benefits of the procedure and the sedation                            options and risks were discussed with the patient.                            All questions were answered, and informed consent                            was obtained. Prior Anticoagulants: The patient has                            taken no previous anticoagulant or antiplatelet                            agents. ASA Grade Assessment: III - A patient with                            severe systemic disease. After reviewing the risks  and benefits, the patient was deemed in                            satisfactory condition to undergo the procedure.                           After obtaining informed consent, the endoscope was                            passed under direct vision. Throughout the                            procedure, the patient's blood pressure, pulse,  and                            oxygen saturations were monitored continuously. The                            EG-[id number] ([id number]) scope was introduced through the                            mouth, and advanced to the second part of duodenum.                            The upper GI endoscopy was accomplished without                            difficulty. The patient tolerated the procedure                            well. Scope In: Scope Out: Findings:      The esophagus was normal. No varices were seen.      Mild portal hypertensive gastropathy was found in the entire examined       stomach. The previously-seen large ulcer was completely healed.      The cardia and gastric fundus were normal on retroflexion.      The examined duodenum was normal. Impression:               - Normal esophagus.                           - Portal hypertensive gastropathy.                           - Normal examined duodenum.                           - No specimens collected. Moderate Sedation:      MAC sedation used Recommendation:           - Resume previous diet.                           - Discontinue PPIs today.                           -  Patient can be discharged home today from a GI                            perspective. Procedure Code(s):        --- Professional ---                           [phone], Esophagogastroduodenoscopy, flexible,                            transoral; diagnostic, including collection of                            specimen(s) by brushing or washing, when performed                            (separate procedure) Diagnosis Code(s):        --- Professional ---                           K76.6, Portal hypertension                           K31.89, Other diseases of stomach and duodenum                           K25.4, Chronic or unspecified gastric ulcer with                            hemorrhage CPT copyright 2016 [employer]. All rights reserved. The codes  documented in this report are preliminary and upon coder review may  be revised to meet current compliance requirements. Dishawn Bhargava L. Myrtie Neither, MD 10/08/2016 12:04:57 PM This report has been signed electronically. Number of Addenda: 0

## 2016-10-08 NOTE — Transfer of Care (Signed)
Immediate Anesthesia Transfer of Care Note  Patient: Dan [name]  Procedure(s) Performed: Procedure(s): ESOPHAGOGASTRODUODENOSCOPY (EGD) (N/A)  Patient Location: Endoscopy Unit  Anesthesia Type:MAC  Level of Consciousness: sedated  Airway & Oxygen Therapy: Patient Spontanous Breathing and Patient connected to nasal cannula oxygen  Post-op Assessment: Report given to RN, Post -op Vital signs reviewed and stable and Patient moving all extremities  Post vital signs: Reviewed and stable  Last Vitals:  Vitals:   10/08/16 0546 10/08/16 1045  BP: 133/82 (!) 156/83  Pulse: (!) 51 (!) 48  Resp: 16 14  Temp: 37.4 C     Last Pain:  Vitals:   10/08/16 1045  TempSrc: Oral  PainSc: 8       Patients Stated Pain Goal: 2 (10/08/16 [id number])  Complications: No apparent anesthesia complications

## 2016-10-08 NOTE — Anesthesia Preprocedure Evaluation (Addendum)
Anesthesia Evaluation  Patient identified by MRN, date of birth, ID band Patient awake    Reviewed: Allergy & Precautions, NPO status , Patient's Chart, lab work & pertinent test results  History of Anesthesia Complications Negative for: history of anesthetic complications  Airway Mallampati: II  TM Distance: >3 FB Neck ROM: Full    Dental  (+) Poor Dentition, Missing, Dental Advisory Given, Loose, Chipped   Pulmonary COPD, Current Smoker,    breath sounds clear to auscultation       Cardiovascular negative cardio ROS   Rhythm:Regular Rate:Normal     Neuro/Psych negative neurological ROS     GI/Hepatic PUD, (+) Cirrhosis       , Hepatitis -Elevated LFTs   Endo/Other  negative endocrine ROS  Renal/GU negative Renal ROS     Musculoskeletal   Abdominal   Peds  Hematology  (+) Blood dyscrasia (platelet count 56K), ,   Anesthesia Other Findings   Reproductive/Obstetrics                            Anesthesia Physical Anesthesia Plan  ASA: III  Anesthesia Plan: MAC   Post-op Pain Management:    Induction:   Airway Management Planned: Nasal Cannula  Additional Equipment: None  Intra-op Plan:   Post-operative Plan:   Informed Consent: I have reviewed the patients History and Physical, chart, labs and discussed the procedure including the risks, benefits and alternatives for the proposed anesthesia with the patient or authorized representative who has indicated his/her understanding and acceptance.   Dental advisory given  Plan Discussed with: CRNA, Anesthesiologist and Surgeon  Anesthesia Plan Comments: (Plan routine monitors, MAC)       Anesthesia Quick Evaluation

## 2016-10-09 LAB — CULTURE, BLOOD (ROUTINE X 2)

## 2016-10-09 LAB — MISC LABCORP TEST (SEND OUT): LABCORP TEST CODE: 551610

## 2016-10-09 LAB — PATHOLOGIST SMEAR REVIEW

## 2016-10-09 LAB — HEPATITIS B DNA, ULTRAQUANTITATIVE, PCR

## 2016-10-09 LAB — HBV REAL-TIME PCR, QUANT
HBV AS IU/ML: 246000000 [IU]/mL
LOG10 HBV AS IU/ML: 8.391 log10IU/mL

## 2016-10-09 NOTE — Plan of Care (Signed)
I was called by the micro lab today that patient's blood culture from 10/22 also grew Burkholderia besides Candida. Repeat blood culture from 10/25 are still negative. Please note that patient had left AMA and was explained the risks of bacteremia, sepsis and death, he had refused further care in the hospital. I still called the patient on the phone number listed [phone] regarding his blood cultures but it is disconnected.    Dan [name] M.D. [facility] 10/09/2016, 5:12 PM  Pager: [phone]

## 2016-10-12 LAB — CULTURE, BLOOD (ROUTINE X 2)
Culture: NO GROWTH
Culture: NO GROWTH

## 2016-10-12 LAB — CULTURE, BODY FLUID-BOTTLE

## 2016-10-12 LAB — CULTURE, BODY FLUID W GRAM STAIN -BOTTLE: Culture: NO GROWTH

## 2016-10-27 ENCOUNTER — Encounter (HOSPITAL_COMMUNITY): Payer: Self-pay | Admitting: *Deleted

## 2016-10-27 ENCOUNTER — Inpatient Hospital Stay (HOSPITAL_COMMUNITY)
Admission: EM | Admit: 2016-10-27 | Discharge: 2016-10-31 | DRG: 871 | Disposition: A | Discharge status: Home / Self Care | Payer: Medicaid Other | Attending: Internal Medicine | Admitting: Internal Medicine

## 2016-10-27 DIAGNOSIS — B182 Chronic viral hepatitis C: Secondary | ICD-10-CM | POA: Diagnosis not present

## 2016-10-27 DIAGNOSIS — F1910 Other psychoactive substance abuse, uncomplicated: Secondary | ICD-10-CM | POA: Diagnosis present

## 2016-10-27 DIAGNOSIS — Z8711 Personal history of peptic ulcer disease: Secondary | ICD-10-CM

## 2016-10-27 DIAGNOSIS — K7469 Other cirrhosis of liver: Secondary | ICD-10-CM | POA: Diagnosis present

## 2016-10-27 DIAGNOSIS — B181 Chronic viral hepatitis B without delta-agent: Secondary | ICD-10-CM | POA: Diagnosis not present

## 2016-10-27 DIAGNOSIS — R1084 Generalized abdominal pain: Secondary | ICD-10-CM

## 2016-10-27 DIAGNOSIS — Z79899 Other long term (current) drug therapy: Secondary | ICD-10-CM

## 2016-10-27 DIAGNOSIS — D696 Thrombocytopenia, unspecified: Secondary | ICD-10-CM | POA: Diagnosis present

## 2016-10-27 DIAGNOSIS — K7460 Unspecified cirrhosis of liver: Secondary | ICD-10-CM | POA: Diagnosis present

## 2016-10-27 DIAGNOSIS — R109 Unspecified abdominal pain: Secondary | ICD-10-CM | POA: Diagnosis present

## 2016-10-27 DIAGNOSIS — R188 Other ascites: Secondary | ICD-10-CM | POA: Diagnosis present

## 2016-10-27 DIAGNOSIS — G894 Chronic pain syndrome: Secondary | ICD-10-CM | POA: Diagnosis present

## 2016-10-27 DIAGNOSIS — F17210 Nicotine dependence, cigarettes, uncomplicated: Secondary | ICD-10-CM | POA: Diagnosis present

## 2016-10-27 DIAGNOSIS — K219 Gastro-esophageal reflux disease without esophagitis: Secondary | ICD-10-CM | POA: Diagnosis present

## 2016-10-27 DIAGNOSIS — B377 Candidal sepsis: Principal | ICD-10-CM | POA: Diagnosis present

## 2016-10-27 DIAGNOSIS — F1110 Opioid abuse, uncomplicated: Secondary | ICD-10-CM | POA: Diagnosis present

## 2016-10-27 DIAGNOSIS — F1290 Cannabis use, unspecified, uncomplicated: Secondary | ICD-10-CM | POA: Diagnosis present

## 2016-10-27 HISTORY — DX: Other chronic pain: G89.29

## 2016-10-27 LAB — COMPREHENSIVE METABOLIC PANEL
ALK PHOS: 108 U/L (ref 38–126)
ALT: 22 U/L (ref 17–63)
AST: 34 U/L (ref 15–41)
Albumin: 2.7 g/dL — ABNORMAL LOW (ref 3.5–5.0)
Anion gap: 6 (ref 5–15)
BUN: 8 mg/dL (ref 6–20)
CHLORIDE: 104 mmol/L (ref 101–111)
CO2: 29 mmol/L (ref 22–32)
CREATININE: 0.93 mg/dL (ref 0.61–1.24)
Calcium: 8.3 mg/dL — ABNORMAL LOW (ref 8.9–10.3)
GFR calc Af Amer: >60 mL/min (ref >60)
GFR calc non Af Amer: >60 mL/min (ref >60)
GLUCOSE: 98 mg/dL (ref 65–99)
Potassium: 3.7 mmol/L (ref 3.5–5.1)
SODIUM: 139 mmol/L (ref 135–145)
Total Bilirubin: 0.7 mg/dL (ref 0.3–1.2)
Total Protein: 6.8 g/dL (ref 6.5–8.1)

## 2016-10-27 LAB — LIPASE, BLOOD: LIPASE: 41 U/L (ref 11–51)

## 2016-10-27 LAB — AMMONIA: Ammonia: 24 umol/L (ref 9–35)

## 2016-10-27 LAB — I-STAT CG4 LACTIC ACID, ED: LACTIC ACID, VENOUS: 2.31 mmol/L — AB (ref 0.5–1.9)

## 2016-10-27 LAB — CBC
HCT: 37.9 % — ABNORMAL LOW (ref 39.0–52.0)
Hemoglobin: 12.4 g/dL — ABNORMAL LOW (ref 13.0–17.0)
MCH: 29.5 pg (ref 26.0–34.0)
MCHC: 32.7 g/dL (ref 30.0–36.0)
MCV: 90.2 fL (ref 78.0–100.0)
PLATELETS: 49 10*3/uL — AB (ref 150–400)
RBC: 4.2 MIL/uL — ABNORMAL LOW (ref 4.22–5.81)
RDW: 14.7 % (ref 11.5–15.5)
WBC: 3.8 10*3/uL — ABNORMAL LOW (ref 4.0–10.5)

## 2016-10-27 LAB — PROTIME-INR
INR: 1.29
Prothrombin Time: 16.1 seconds — ABNORMAL HIGH (ref 11.4–15.2)

## 2016-10-27 LAB — POC OCCULT BLOOD, ED: Fecal Occult Bld: NEGATIVE

## 2016-10-27 MED ORDER — MORPHINE SULFATE (PF) 4 MG/ML IV SOLN
4.0000 mg | Freq: Once | INTRAVENOUS | Status: AC
  Administered 2016-10-27: 4 mg via INTRAVENOUS
  Filled 2016-10-27: qty 1

## 2016-10-27 MED ORDER — DEXTROSE 5 % IV SOLN
2.0000 g | Freq: Once | INTRAVENOUS | Status: AC
  Administered 2016-10-28: 2 g via INTRAVENOUS
  Filled 2016-10-27: qty 2

## 2016-10-27 MED ORDER — LIDOCAINE HCL (PF) 1 % IJ SOLN
30.0000 mL | Freq: Once | INTRAMUSCULAR | Status: AC
  Administered 2016-10-27: 30 mL
  Filled 2016-10-27: qty 30

## 2016-10-27 NOTE — ED Provider Notes (Signed)
Emergency Department Provider Note   I have reviewed the triage vital signs and the nursing notes.   HISTORY  Chief Complaint Abdominal Pain   HPI Dan [name] is a 45 y.o. male with PMH of Hepatitis B and C, polysubstance abuse, and upper GI bleed presents to the emergency department for evaluation of subjective fevers, abdominal distention and pain, nausea, vomiting, and bleeding per rectum for the past 3 days. The patient states that he was hospitalized 2 weeks ago and left AMA. Patient states that he became concerned and had family issues and left. At that time he was told he had a blood infection and recommendation was for outpatient antibiotics and anti-fungals. Patient is a during that time multiple liters of fluid were taken off of his abdomen.   Since the abdominal pain and GI bleeding have started he denies any fainting, chest pain, palpitations, or SOB.    Past Medical History:  Diagnosis Date  . Chronic pain   . Cirrhosis of liver (HCC)   . Gastric ulceration   . Hepatitis B   . Hepatitis C   . Polysubstance abuse   . Upper GI bleed     Patient Active Problem List   Diagnosis Date Noted  . Candidemia (HCC)   . Chronic gastric ulcer with hemorrhage   . Nausea & vomiting 10/05/2016  . Sepsis (HCC) 10/05/2016  . GIB (gastrointestinal bleeding) 10/05/2016  . Acute respiratory failure with hypoxia (HCC) 10/05/2016  . Septic shock (HCC) 10/05/2016  . Generalized abdominal pain   . SBP (spontaneous bacterial peritonitis) (HCC) 09/25/2016  . Spontaneous bacterial peritonitis (HCC) 09/25/2016  . Ascites   . Decompensated HCV cirrhosis (HCC)   . Chronic giant gastric ulcer   . Chronic hepatitis B (HCC)   . Chronic hepatitis C without hepatic coma (HCC)   . Other cirrhosis of liver (HCC) 07/14/2016  . Abdominal pain 07/14/2016  . Gastritis   . Hematemesis     Past Surgical History:  Procedure Laterality Date  . ESOPHAGOGASTRODUODENOSCOPY N/A 07/16/2016   Procedure: ESOPHAGOGASTRODUODENOSCOPY (EGD);  Surgeon: Ruffin FrederickSteven Paul Armbruster, MD;  [facility];  Service: Gastroenterology;  Laterality: N/A;  . ESOPHAGOGASTRODUODENOSCOPY N/A 10/08/2016   Procedure: ESOPHAGOGASTRODUODENOSCOPY (EGD);  Surgeon: Sherrilyn RistHenry L Danis III, MD;  [facility];  Service: Endoscopy;  Laterality: N/A;  . LUNG SURGERY     right lobectomy    Current Outpatient Rx  . Order #: [id number] Class: Print  . Order #: [id number] Class: Print  . Order #: [id number] Class: Print  . Order #: [id number] Class: Print  . Order #: [id number] Class: Print  . Order #: [id number] Class: Print  . Order #: [id number] Class: Print    Allergies Patient has no known allergies.  Family History  Problem Relation Age of Onset  . Dementia Mother     Social History Social History  Substance Use Topics  . Smoking status: Current Every Day Smoker    Packs/day: 0.50    Types: Cigarettes  . Smokeless tobacco: Never Used  . Alcohol use No    Review of Systems  Constitutional: Subjective fever/chills Eyes: No visual changes. ENT: No sore throat. Cardiovascular: Denies chest pain. Respiratory: Denies shortness of breath. Gastrointestinal: Positive abdominal pain.  No nausea, no vomiting.  No diarrhea.  No constipation. Positive bloody stools.  Genitourinary: Negative for dysuria. Musculoskeletal: Negative for back pain. Skin: Negative for rash. Neurological: Negative for headaches, focal weakness or numbness.  10-point ROS otherwise negative.  ____________________________________________   PHYSICAL EXAM:  VITAL  SIGNS: ED Triage Vitals [10/27/16 1918]  Enc Vitals Group     BP 133/85     Pulse Rate 72     Resp 22     Temp 98.3 F (36.8 C)     Temp Source Oral     SpO2 98 %     Weight 152 lb (68.9 kg)     Height 5\' 11"  (1.803 m)   Constitutional: Alert and oriented. Appears uncomfortable.  Eyes: Conjunctivae are normal.  Head: Atraumatic. Nose: No  congestion/rhinnorhea. Mouth/Throat: Mucous membranes are dry. Oropharynx non-erythematous. Neck: No stridor.  Cardiovascular: Normal rate, regular rhythm. Good peripheral circulation. Grossly normal heart sounds.   Respiratory: Normal respiratory effort.  No retractions. Lungs CTAB. Gastrointestinal: Soft with mild diffuse tenderness. Positive distension with reducible periumbilical hernia.  Musculoskeletal: No lower extremity tenderness nor edema. No gross deformities of extremities. Neurologic:  Normal speech and language. No gross focal neurologic deficits are appreciated.  Skin:  Skin is warm, dry and intact. No rash noted.  ____________________________________________   LABS (all labs ordered are listed, but only abnormal results are displayed)  Labs Reviewed  COMPREHENSIVE METABOLIC PANEL - Abnormal; Notable for the following:       Result Value   Calcium 8.3 (*)    Albumin 2.7 (*)    All other components within normal limits  CBC - Abnormal; Notable for the following:    WBC 3.8 (*)    RBC 4.20 (*)    Hemoglobin 12.4 (*)    HCT 37.9 (*)    Platelets 49 (*)    All other components within normal limits  PROTIME-INR - Abnormal; Notable for the following:    Prothrombin Time 16.1 (*)    All other components within normal limits  I-STAT CG4 LACTIC ACID, ED - Abnormal; Notable for the following:    Lactic Acid, Venous 2.31 (*)    All other components within normal limits  CULTURE, BLOOD (ROUTINE X 2)  CULTURE, BLOOD (ROUTINE X 2)  GRAM STAIN  LIPASE, BLOOD  AMMONIA  URINALYSIS, ROUTINE W REFLEX MICROSCOPIC (NOT AT [facility])  BODY FLUID CELL COUNT WITH DIFFERENTIAL  RAPID URINE DRUG SCREEN, HOSP PERFORMED  POC OCCULT BLOOD, ED   ____________________________________________  RADIOLOGY  None ____________________________________________   PROCEDURES  Procedure(s) performed:   .Paracentesis Date/Time: 10/27/2016 11:04 PM Performed by: LONG, JOSHUA G Authorized by:  Maia Plan   Consent:    Consent obtained:  Verbal   Consent given by:  Patient   Risks discussed:  Bleeding, bowel perforation, infection and pain   Alternatives discussed:  Alternative treatment and observation Pre-procedure details:    Procedure purpose:  Diagnostic   Preparation: Patient was prepped and draped in usual sterile fashion   Anesthesia (see MAR for exact dosages):    Anesthesia method:  Local infiltration   Local anesthetic:  Lidocaine 1% w/o epi Procedure details:    Needle gauge:  18   Ultrasound guidance: yes     Puncture site:  L lower quadrant   Fluid removed amount:  0   Dressing:  4x4 sterile gauze Post-procedure details:    Patient tolerance of procedure:  Tolerated with difficulty   Emergency Ultrasound:  US Guidance for needle guidance  Performed by Dr. [name] Indication: diagnostic paracentesis.  Linear probe used in real-time to visualize location of needle entry through skin. Interpretation: small/moderate volume ascites best appreciated to in LLQ.  Image archived electronically.   ____________________________________________   INITIAL IMPRESSION / ASSESSMENT AND  PLAN / ED COURSE  Pertinent labs & imaging results that were available during my care of the patient were reviewed by me and considered in my medical decision making (see chart for details).  Patient resents to the emergency department for evaluation of subjective fevers, abdominal distention, pain, concern for rectal bleeding. Patient has a slightly distended abdomen with minimal tenderness to palpation. He is afebrile here with normal vital signs. Review of his discharge summary shows an AMA discharge during an admission where he was found to have fungemia. The notes at that time show plan to place PICC line and give Ceftriaxone 2g and Fluconazole IV. Plan for pain control, labs, and diagnostic paracentesis.   11:00 PM Small area of ascites fluid identified on ultrasound. There is  some bowel floating near the anterior abdominal [place]. This improves with having the patient lay on his side. I attempted diagnostic paracentesis but was not successful. The patient was frequently shifting and given the relatively small volume of the fluid pocket to begin with I decided to abandon the procedure. See attached procedure note. Will begin abx at this time.   Discussed patient's case with hospitalist, Dr. Ophelia CharterYates. She will be down to see and place orders. Patient and family (if present) updated with plan. Care transferred to hospitalist service.  I reviewed all nursing notes, vitals, pertinent old records, EKGs, labs, imaging (as available).  ____________________________________________  FINAL CLINICAL IMPRESSION(S) / ED DIAGNOSES  Final diagnoses:  Generalized abdominal pain     MEDICATIONS GIVEN DURING THIS VISIT:  Medications  cefTRIAXone (ROCEPHIN) 2 g in dextrose 5 % 50 mL IVPB (not administered)  lidocaine (PF) (XYLOCAINE) 1 % injection 30 mL (30 mLs Other Given 10/27/16 2229)  morphine 4 MG/ML injection 4 mg (4 mg Intravenous Given 10/27/16 2037)  morphine 4 MG/ML injection 4 mg (4 mg Intravenous Given 10/27/16 2226)     NEW OUTPATIENT MEDICATIONS STARTED DURING THIS VISIT:  None   Note:  This document was prepared using Dragon voice recognition software and may include unintentional dictation errors.  Alona BeneJoshua Long, MD Emergency Medicine   Maia PlanJoshua G Long, MD 10/28/16 [phone]

## 2016-10-27 NOTE — H&P (Signed)
History and Physical    [name]:[id number] DOB: March 28, 1971 DOA: 10/27/2016  PCP: [name], NP Consultants:  None Patient coming from: home - lives with mother; NOK: mother, does not know her telephone number but other contact number is [phone]  Chief Complaint: ascites  HPI: Dan [name] is a 45 y.o. male with medical history significant of liver cirrhosis, hepatitis C, hepatitis B, polysubstance abuse, hernia, GI bleeding, tobacco abuse, GERD, and chronic thrombocytopenia, who reports "my liver is messed up and my stomach is swollen like a basketball and I'm in misery, I'm hurting so bad I can't stand it."  He was hospitalized and signed out [facility] 10/26, has been hurting since but worse since Sunday.  Abdominal swelling Sunday and worse today.  Denies alcohol use, reports no prior use.  Rare marijuana use, denies other drug use currently, acknowledges remote history of use.  Normal BMs but had some blood in BMs since Sunday, now dark black looking.  Mild sharp left-sided chest pain occasionally.  No SOB. No urinary symptoms.  Patient was previously admitted from 10/13-19 for ascites without SBP.  He had paracentesis twice and was started on Aldactone and Lasix and told to f/u with GI for ascites as well as chronic Hep B and C.  He did not fill those prescriptions. He returned with recurrent symptoms on 10/22 and was admitted from 10/23-26 who left AMA on 10/26.  Blood cultures were positive for Candida tropicalis and ID was consulted and recommended Rocephin 2g IV daily for SBP for 7 days and then weekly SBP prophylaxis and IV fluconazole for 14 days via PICC line.  He left AMA without receiving treatment. He does report that he was having a personal emergency at that time and that he has taken care of this issue and will be willing to stay as long as necessary to get his issues taken care of.  ED Course: Per Dr. [name]: Patient resents to the emergency department for evaluation of  subjective fevers, abdominal distention, pain, concern for rectal bleeding. Patient has a slightly distended abdomen with minimal tenderness to palpation. He is afebrile here with normal vital signs. Review of his discharge summary shows an AMA discharge during an admission where he was found to have fungemia. The notes at that time show plan to place PICC line and give Ceftriaxone 2g and Fluconazole IV. Plan for pain control, labs, and diagnostic paracentesis.  11:00 PM  Small area of ascites fluid identified on ultrasound. There is some bowel floating near the anterior abdominal [place]. This improves with having the patient lay on his side. I attempted diagnostic paracentesis but was not successful. The patient was frequently shifting and given the relatively small volume of the fluid pocket to begin with I decided to abandon the procedure. See attached procedure note. Will begin abx at this time.  Discussed patient's case with hospitalist, Dr. Ophelia Charter. She will be down to see and place orders. Patient and family (if present) updated with plan. Care transferred to hospitalist service.   Review of Systems: As per HPI; otherwise 10 point review of systems reviewed and negative.   Ambulatory Status:  Recently needing ambulatory assistance but independent prior  Past Medical History:  Diagnosis Date  . Chronic pain   . Cirrhosis of liver (HCC)   . Gastric ulceration   . Hepatitis B   . Hepatitis C   . Polysubstance abuse   . Upper GI bleed     Past Surgical History:  Procedure Laterality Date  . ESOPHAGOGASTRODUODENOSCOPY N/A 07/16/2016   Procedure: ESOPHAGOGASTRODUODENOSCOPY (EGD);  Surgeon: Ruffin Frederick, MD;  Location: [facility] ENDOSCOPY;  Service: Gastroenterology;  Laterality: N/A;  . ESOPHAGOGASTRODUODENOSCOPY N/A 10/08/2016   Procedure: ESOPHAGOGASTRODUODENOSCOPY (EGD);  Surgeon: Sherrilyn Rist, MD;  Location[facility] ENDOSCOPY;  Service: Endoscopy;  Laterality: N/A;  . LUNG SURGERY      right lobectomy    Social History   Social History  . Marital status: Single    Spouse name: N/A  . Number of children: N/A  . Years of education: N/A   Occupational History  . disabled    Social History Main Topics  . Smoking status: Current Every Day Smoker    Packs/day: 0.50    Types: Cigarettes  . Smokeless tobacco: Never Used  . Alcohol use No  . Drug use: No     Comment: Pt admits to hx of IV drug use however denies any recent drug use.  Marland Kitchen Sexual activity: Not on file   Other Topics Concern  . Not on file   Social History Narrative  . No narrative on file    No Known Allergies  Family History  Problem Relation Age of Onset  . Dementia Mother     Prior to Admission medications   Medication Sig Start Date End Date Taking? Authorizing Provider  furosemide (LASIX) 40 MG tablet Take 1 tablet (40 mg total) by mouth daily. 10/01/16  Yes Shanker Levora Dredge, MD  lactulose (CHRONULAC) 10 GM/15ML solution Take 30 mLs (20 g total) by mouth daily. 10/01/16  Yes Shanker Levora Dredge, MD  ondansetron (ZOFRAN) 4 MG tablet Take 1 tablet (4 mg total) by mouth every 8 (eight) hours as needed for nausea or vomiting. 10/01/16  Yes Shanker Levora Dredge, MD  pantoprazole (PROTONIX) 40 MG tablet Take 1 tablet (40 mg total) by mouth 2 (two) times daily. 10/01/16  Yes Shanker Levora Dredge, MD  spironolactone (ALDACTONE) 100 MG tablet Take 1 tablet (100 mg total) by mouth daily. 10/01/16  Yes Shanker Levora Dredge, MD  sucralfate (CARAFATE) 1 GM/10ML suspension Take 10 mLs (1 g total) by mouth every 6 (six) hours. 10/01/16  Yes Shanker Levora Dredge, MD  oxyCODONE (ROXICODONE) 5 MG immediate release tablet Take 1 tablet (5 mg total) by mouth every 6 (six) hours as needed for severe pain. Patient not taking: Reported on 10/27/2016 07/17/16   Thomasene Lot, MD    Physical Exam: Vitals:   10/27/16 2045 10/27/16 2130 10/27/16 2200 10/27/16 2215  BP: 111/79 114/73 120/81 114/85  Pulse: 70 76 77 75  Resp: 20  18 13 17   Temp:      TempSrc:      SpO2: 97% 95% 96% 96%  Weight:      Height:         General:  Appears calm and comfortable and is NAD; appears much older than stated age Eyes:  PERRL, EOMI, normal lids, iris; no obvious scleral icterus ENT:  grossly normal hearing, lips & tongue, mmm Neck:  no LAD, masses or thyromegaly Cardiovascular:  RRR, no m/r/g. No LE edema.  Respiratory:  CTA bilaterally, no w/r/r. Normal respiratory effort. Abdomen:  soft, ntnd, NABS; there is a moderate umbilical hernia as well as significant distension which appears to be consistent with ascites although there is no clear fluid wave Skin:  no rash or induration seen on limited exam; there do appear to be needle marks on his arms primarily in various stages of  healing concerning for ongoing IVDA Musculoskeletal:  grossly normal tone BUE/BLE, good ROM, no bony abnormality Psychiatric:  grossly normal mood and affect, speech fluent and appropriate, AOx3 Neurologic:  CN 2-12 grossly intact, moves all extremities in coordinated fashion, sensation intact  Labs on Admission: I have personally reviewed following labs and imaging studies  CBC:  Recent Labs Lab 10/27/16 2010  WBC 3.8*  HGB 12.4*  HCT 37.9*  MCV 90.2  PLT 49*   Basic Metabolic Panel:  Recent Labs Lab 10/27/16 2010  NA 139  K 3.7  CL 104  CO2 29  GLUCOSE 98  BUN 8  CREATININE 0.93  CALCIUM 8.3*   GFR: Estimated Creatinine Clearance: 95.7 mL/min (by C-G formula based on SCr of 0.93 mg/dL). Liver Function Tests:  Recent Labs Lab 10/27/16 2010  AST 34  ALT 22  ALKPHOS 108  BILITOT 0.7  PROT 6.8  ALBUMIN 2.7*    Recent Labs Lab 10/27/16 2010  LIPASE 41    Recent Labs Lab 10/27/16 2010  AMMONIA 24   Coagulation Profile:  Recent Labs Lab 10/27/16 2010  INR 1.29   Cardiac Enzymes: No results for input(s): CKTOTAL, CKMB, CKMBINDEX, TROPONINI in the last 168 hours. BNP (last 3 results) No results for  input(s): PROBNP in the last 8760 hours. HbA1C: No results for input(s): HGBA1C in the last 72 hours. CBG: No results for input(s): GLUCAP in the last 168 hours. Lipid Profile: No results for input(s): CHOL, HDL, LDLCALC, TRIG, CHOLHDL, LDLDIRECT in the last 72 hours. Thyroid Function Tests: No results for input(s): TSH, T4TOTAL, FREET4, T3FREE, THYROIDAB in the last 72 hours. Anemia Panel: No results for input(s): VITAMINB12, FOLATE, FERRITIN, TIBC, IRON, RETICCTPCT in the last 72 hours. Urine analysis:    Component Value Date/Time   COLORURINE YELLOW 10/04/2016 2159   APPEARANCEUR CLEAR 10/04/2016 2159   LABSPEC 1.020 10/04/2016 2159   PHURINE 6.0 10/04/2016 2159   GLUCOSEU NEGATIVE 10/04/2016 2159   HGBUR NEGATIVE 10/04/2016 2159   BILIRUBINUR NEGATIVE 10/04/2016 2159   KETONESUR NEGATIVE 10/04/2016 2159   PROTEINUR NEGATIVE 10/04/2016 2159   NITRITE NEGATIVE 10/04/2016 2159   LEUKOCYTESUR NEGATIVE 10/04/2016 2159    Creatinine Clearance: Estimated Creatinine Clearance: 95.7 mL/min (by C-G formula based on SCr of 0.93 mg/dL).  Sepsis Labs: @LABRCNTIP (procalcitonin:4,lacticidven:4) )No results found for this or any previous visit (from the past 240 hour(s)).   Radiological Exams on Admission: No results found.  EKG: Not done  Assessment/Plan Principal Problem:   Ascites Active Problems:   Other cirrhosis of liver (HCC)   Chronic hepatitis B (HCC)   Chronic hepatitis C without hepatic coma (HCC)   Thrombocytopenia (HCC)   Patient with h/o ascites from cirrhosis related to chronic Hep B/C -Will admit based on prior recent admission where patient left AMA -Blood cultures repeated; previously grew Candida tropicalis and was not treated [facility] -ER physician unsuccessful with paracentesis attempt, will request IR consult -For now while awaiting cultures, resume Rocephin 2 grams IV daily and Diflucan 200 mg IV daily -U/A and UDS pending -  patient denies lifetime h/o ETOH dependence but does acknowledge prior h/o IVDA and current ongoing occasional marijuana use -Hemoccult stool, pending -Resume Lasix, Aldactone, Carafate, Lactulose, Protonix (home meds - which patient is not taking) -Resume Oxycodone for pain; suspect that pain medication is one of the primary reasons patient agreed to admission tonight and suspect that patient will not be satisfied with this method of pain control -With thrombocytopenia,  SCDs for DVT prophylaxis -Additionally planning based on evaluation which is likely to occur over the next 24 hours    DVT prophylaxis:  SCDs Code Status:  Full - confirmed with patient Family Communication: None present Disposition Plan:  Home once clinically improved Consults called: None  Admission status: Admit - It is my clinical opinion that admission to INPATIENT is reasonable and necessary because this patient will require at least 2 midnights in the hospital to treat this condition based on the medical complexity of the problems presented.  Given the aforementioned information, the predictability of an adverse outcome is felt to be significant.     Jonah BlueJennifer Harlis Champoux MD [facility]  If 7PM-7AM, please contact night-coverage [email or web address] Password [email or web address]  10/28/2016, 12:22 AM

## 2016-10-27 NOTE — ED Notes (Signed)
Admitting at bedside 

## 2016-10-27 NOTE — ED Triage Notes (Signed)
Per [facility] pt from home with c/o abd pain - intermittent for the past few days, pt admits he was recently hospitalized at this facility in ICU d/t bacteriemia however pt admits he left AMA after a couple of days d/t personal reasons. Pt also admits to recent dark "tarry" stools, abd distention w/ a hx of requiring paracentesis. Pt A&Ox4 on arrival in no acute distress.

## 2016-10-28 DIAGNOSIS — R188 Other ascites: Secondary | ICD-10-CM | POA: Diagnosis present

## 2016-10-28 DIAGNOSIS — B182 Chronic viral hepatitis C: Secondary | ICD-10-CM | POA: Diagnosis present

## 2016-10-28 DIAGNOSIS — K7460 Unspecified cirrhosis of liver: Secondary | ICD-10-CM | POA: Diagnosis present

## 2016-10-28 DIAGNOSIS — Z79899 Other long term (current) drug therapy: Secondary | ICD-10-CM | POA: Diagnosis not present

## 2016-10-28 DIAGNOSIS — F1290 Cannabis use, unspecified, uncomplicated: Secondary | ICD-10-CM | POA: Diagnosis present

## 2016-10-28 DIAGNOSIS — F1910 Other psychoactive substance abuse, uncomplicated: Secondary | ICD-10-CM | POA: Diagnosis present

## 2016-10-28 DIAGNOSIS — F17210 Nicotine dependence, cigarettes, uncomplicated: Secondary | ICD-10-CM | POA: Diagnosis present

## 2016-10-28 DIAGNOSIS — G894 Chronic pain syndrome: Secondary | ICD-10-CM | POA: Diagnosis present

## 2016-10-28 DIAGNOSIS — I38 Endocarditis, valve unspecified: Secondary | ICD-10-CM | POA: Diagnosis not present

## 2016-10-28 DIAGNOSIS — B49 Unspecified mycosis: Secondary | ICD-10-CM | POA: Diagnosis not present

## 2016-10-28 DIAGNOSIS — F1110 Opioid abuse, uncomplicated: Secondary | ICD-10-CM | POA: Diagnosis present

## 2016-10-28 DIAGNOSIS — B377 Candidal sepsis: Secondary | ICD-10-CM | POA: Diagnosis present

## 2016-10-28 DIAGNOSIS — D696 Thrombocytopenia, unspecified: Secondary | ICD-10-CM | POA: Diagnosis present

## 2016-10-28 DIAGNOSIS — K219 Gastro-esophageal reflux disease without esophagitis: Secondary | ICD-10-CM | POA: Diagnosis present

## 2016-10-28 DIAGNOSIS — B181 Chronic viral hepatitis B without delta-agent: Secondary | ICD-10-CM | POA: Diagnosis present

## 2016-10-28 DIAGNOSIS — R1084 Generalized abdominal pain: Secondary | ICD-10-CM | POA: Diagnosis present

## 2016-10-28 DIAGNOSIS — Z8711 Personal history of peptic ulcer disease: Secondary | ICD-10-CM | POA: Diagnosis not present

## 2016-10-28 DIAGNOSIS — K7469 Other cirrhosis of liver: Secondary | ICD-10-CM | POA: Diagnosis not present

## 2016-10-28 LAB — URINALYSIS, ROUTINE W REFLEX MICROSCOPIC
Bilirubin Urine: NEGATIVE
Glucose, UA: NEGATIVE mg/dL
HGB URINE DIPSTICK: NEGATIVE
Ketones, ur: NEGATIVE mg/dL
LEUKOCYTES UA: NEGATIVE
Nitrite: NEGATIVE
Protein, ur: NEGATIVE mg/dL
SPECIFIC GRAVITY, URINE: 1.022 (ref 1.005–1.030)
pH: 6 (ref 5.0–8.0)

## 2016-10-28 LAB — BASIC METABOLIC PANEL
ANION GAP: 7 (ref 5–15)
BUN: 11 mg/dL (ref 6–20)
CALCIUM: 7.8 mg/dL — AB (ref 8.9–10.3)
CO2: 25 mmol/L (ref 22–32)
Chloride: 106 mmol/L (ref 101–111)
Creatinine, Ser: 0.78 mg/dL (ref 0.61–1.24)
GLUCOSE: 100 mg/dL — AB (ref 65–99)
Potassium: 4 mmol/L (ref 3.5–5.1)
SODIUM: 138 mmol/L (ref 135–145)

## 2016-10-28 LAB — CBC
HCT: 32.9 % — ABNORMAL LOW (ref 39.0–52.0)
HEMOGLOBIN: 10.6 g/dL — AB (ref 13.0–17.0)
MCH: 29.3 pg (ref 26.0–34.0)
MCHC: 32.2 g/dL (ref 30.0–36.0)
MCV: 90.9 fL (ref 78.0–100.0)
Platelets: 50 10*3/uL — ABNORMAL LOW (ref 150–400)
RBC: 3.62 MIL/uL — ABNORMAL LOW (ref 4.22–5.81)
RDW: 14.9 % (ref 11.5–15.5)
WBC: 3.6 10*3/uL — AB (ref 4.0–10.5)

## 2016-10-28 LAB — RAPID URINE DRUG SCREEN, HOSP PERFORMED
AMPHETAMINES: NOT DETECTED
Barbiturates: NOT DETECTED
Benzodiazepines: NOT DETECTED
Cocaine: NOT DETECTED
OPIATES: POSITIVE — AB
Tetrahydrocannabinol: NOT DETECTED

## 2016-10-28 MED ORDER — OXYCODONE HCL 5 MG PO TABS
5.0000 mg | ORAL_TABLET | Freq: Four times a day (QID) | ORAL | Status: DC | PRN
  Administered 2016-10-28: 5 mg via ORAL
  Filled 2016-10-28: qty 1

## 2016-10-28 MED ORDER — MORPHINE SULFATE (PF) 4 MG/ML IV SOLN
4.0000 mg | Freq: Once | INTRAVENOUS | Status: AC
  Administered 2016-10-28: 4 mg via INTRAVENOUS
  Filled 2016-10-28: qty 1

## 2016-10-28 MED ORDER — LACTULOSE 10 GM/15ML PO SOLN: 20.0000 g | Freq: Every day | ORAL | Status: DC

## 2016-10-28 MED ORDER — ACETAMINOPHEN 650 MG RE SUPP: 650.0000 mg | Freq: Four times a day (QID) | RECTAL | Status: DC | PRN

## 2016-10-28 MED ORDER — ONDANSETRON HCL 4 MG PO TABS: 4.0000 mg | ORAL_TABLET | Freq: Four times a day (QID) | ORAL | Status: DC | PRN

## 2016-10-28 MED ORDER — FLUCONAZOLE IN SODIUM CHLORIDE 400-0.9 MG/200ML-% IV SOLN
400.0000 mg | Freq: Every day | INTRAVENOUS | Status: DC
  Administered 2016-10-28 – 2016-10-29 (×2): 400 mg via INTRAVENOUS
  Filled 2016-10-28 (×3): qty 200

## 2016-10-28 MED ORDER — FLUCONAZOLE IN SODIUM CHLORIDE 200-0.9 MG/100ML-% IV SOLN
200.0000 mg | Freq: Every day | INTRAVENOUS | Status: DC
  Administered 2016-10-28: 200 mg via INTRAVENOUS
  Filled 2016-10-28 (×2): qty 100

## 2016-10-28 MED ORDER — SPIRONOLACTONE 25 MG PO TABS: 100.0000 mg | ORAL_TABLET | Freq: Every day | ORAL | Status: DC

## 2016-10-28 MED ORDER — OXYCODONE HCL 5 MG PO TABS
15.0000 mg | ORAL_TABLET | ORAL | Status: DC | PRN
  Administered 2016-10-28 – 2016-10-30 (×7): 15 mg via ORAL
  Filled 2016-10-28 (×8): qty 3

## 2016-10-28 MED ORDER — FUROSEMIDE 40 MG PO TABS
40.0000 mg | ORAL_TABLET | Freq: Every day | ORAL | Status: DC
  Administered 2016-10-28 – 2016-10-30 (×3): 40 mg via ORAL
  Filled 2016-10-28 (×3): qty 1

## 2016-10-28 MED ORDER — DEXTROSE 5 % IV SOLN
2.0000 g | INTRAVENOUS | Status: DC
  Administered 2016-10-28 – 2016-10-29 (×2): 2 g via INTRAVENOUS
  Filled 2016-10-28 (×3): qty 2

## 2016-10-28 MED ORDER — PANTOPRAZOLE SODIUM 40 MG PO TBEC
40.0000 mg | DELAYED_RELEASE_TABLET | Freq: Two times a day (BID) | ORAL | Status: DC
  Administered 2016-10-28 – 2016-10-30 (×5): 40 mg via ORAL
  Filled 2016-10-28 (×6): qty 1

## 2016-10-28 MED ORDER — SUCRALFATE 1 GM/10ML PO SUSP
1.0000 g | Freq: Four times a day (QID) | ORAL | Status: DC
  Administered 2016-10-28 – 2016-10-30 (×8): 1 g via ORAL
  Filled 2016-10-28 (×9): qty 10

## 2016-10-28 MED ORDER — ACETAMINOPHEN 325 MG PO TABS: 650.0000 mg | ORAL_TABLET | Freq: Four times a day (QID) | ORAL | Status: DC | PRN

## 2016-10-28 MED ORDER — MORPHINE SULFATE (PF) 2 MG/ML IV SOLN
2.0000 mg | INTRAVENOUS | Status: AC | PRN
  Administered 2016-10-28 – 2016-10-29 (×10): 2 mg via INTRAVENOUS
  Filled 2016-10-28 (×11): qty 1

## 2016-10-28 MED ORDER — SPIRONOLACTONE 25 MG PO TABS
100.0000 mg | ORAL_TABLET | Freq: Every day | ORAL | Status: DC
  Administered 2016-10-28 – 2016-10-30 (×3): 100 mg via ORAL
  Filled 2016-10-28 (×3): qty 4

## 2016-10-28 MED ORDER — LACTULOSE 10 GM/15ML PO SOLN
20.0000 g | Freq: Two times a day (BID) | ORAL | Status: DC
  Administered 2016-10-28 – 2016-10-29 (×4): 20 g via ORAL
  Filled 2016-10-28 (×4): qty 30

## 2016-10-28 MED ORDER — ONDANSETRON HCL 4 MG/2ML IJ SOLN: 4.0000 mg | Freq: Four times a day (QID) | INTRAMUSCULAR | Status: DC | PRN

## 2016-10-28 MED ORDER — PANTOPRAZOLE SODIUM 40 MG PO TBEC: 40.0000 mg | DELAYED_RELEASE_TABLET | Freq: Two times a day (BID) | ORAL | Status: DC

## 2016-10-28 NOTE — Progress Notes (Addendum)
PROGRESS NOTE        PATIENT DETAILS Name: Dan [name] Age: 45 y.o. Sex: male Date of Birth: 1971-07-23 Admit Date: 10/27/2016 Admitting Physician Jonah BlueJennifer Yates, MD UUV:OZDG,UYQIHKPCP:YORK,[name], NP  Brief Narrative: Patient is a 45 y.o. male history of IVDA (crushes opana), history of hep B/C, cirrhosis requiring frequent paracentesis, recent history of upper GI bleeding-recent numerous admissions, last admission he was found to have fungemia-he unfortunately left the hospital AGAINST MEDICAL ADVICE on 10/26 before completing antifungal therapy. He was admitted on 11/14, for evaluation of abdominal pain.  Subjective: Continues to have abdominal pain-which is mostly chronic in nature.  IDenies nausea, vomiting or diarrhea. He acknowledges active IVDA with Opana   Assessment/Plan: Recent Candida fungemia (positive blood culture on 10/04/16): Continue fluconazole, have consulted infectious disease-patient has history of IVDA. Recent transthoracic echocardiogram on 10/25 negative for vegetations. Reviewed prior records, initially thought to have fungemia from SBP-however with patient acknowledging IVDA-feel that this probably is the likely source of infection. Spoke with Dr. Luciana Axeomer, recommended TEE (have consulted cards). Will likely need ophthalmology evaluation at some point-perhaps can be done in the outpatient setting-await further recommendations from infectious disease.  Abdominal pain: Abdomen is soft-he does appear to have ascites but it is not tense. Pain is mostly chronic, but given history of cirrhosis, it is probably reasonable to perform a paracentesis to rule out SBP. Continue Rocephin for now.  Cirrhosis: Relatively well compensated, continue Lasix and Aldactone. Follow.  Chronic pain syndrome: Has mostly chronic abdominal pain-unfortunately IVDA with crushed opana-await paracentesis to rule out SBP-he is at risk of having withdrawal symptoms-continue with as  needed narcotics for now. May benefit from initiation into a methadone program as an outpatient.   Thrombocytopenia: Stable-likely chronic and secondary to liver cirrhosis  Chronic hep B and hep C: Needs to demonstrate compliance to medications/follow-up and abstinence from drug use-before even considering outpatient referral to the hepatology clinic.  Recent upper GI bleed due to a large gastric ulcer (August 2017): Repeat endoscopy on 10/26 negative for ulcer. Continue PPI.  Active history of IVDA: Acknowledges that he injects crushed opana-counseled extensively.May benefit from initiation into a methadone program as an outpatient.    DVT Prophylaxis: SCD's-given thrombocytopenia  Code Status: Full code   Family Communication: None at bedside  Disposition Plan: Remain inpatient-requires several more days of hospitalization before consideration of discharge  Antimicrobial agents: See below  Procedures: None  CONSULTS:  ID  Time spent: 25 minutes-Greater than 50% of this time was spent in counseling, explanation of diagnosis, planning of further management, and coordination of care.  MEDICATIONS: Anti-infectives    Start     Dose/Rate Route Frequency Ordered Stop   10/28/16 2200  cefTRIAXone (ROCEPHIN) 2 g in dextrose 5 % 50 mL IVPB     2 g 100 mL/hr over 30 Minutes Intravenous Every 24 hours 10/28/16 0027     10/28/16 0100  fluconazole (DIFLUCAN) IVPB 200 mg     200 mg 100 mL/hr over 60 Minutes Intravenous Daily at bedtime 10/28/16 0027     10/27/16 2300  cefTRIAXone (ROCEPHIN) 2 g in dextrose 5 % 50 mL IVPB     2 g 100 mL/hr over 30 Minutes Intravenous  Once 10/27/16 2257 10/28/16 0041      Scheduled Meds: . cefTRIAXone (ROCEPHIN)  IV  2 g Intravenous Q24H  .  fluconazole (DIFLUCAN) IV  200 mg Intravenous QHS  . furosemide  40 mg Oral Daily  . lactulose  20 g Oral BID  . pantoprazole  40 mg Oral BID  . spironolactone  100 mg Oral Daily  . sucralfate  1 g Oral  Q6H   Continuous Infusions: PRN Meds:.acetaminophen **OR** acetaminophen, morphine injection, ondansetron **OR** ondansetron (ZOFRAN) IV, oxyCODONE   PHYSICAL EXAM: Vital signs: Vitals:   10/28/16 0100 10/28/16 0115 10/28/16 0149 10/28/16 0543  BP: 115/82 112/76 129/88 122/83  Pulse: (!) 40 72 71 74  Resp:   18 18  Temp:   98.4 F (36.9 C) 98.9 F (37.2 C)  TempSrc:   Oral Oral  SpO2: 96% 96% 99% 95%  Weight:   69.5 kg (153 lb 4.8 oz)   Height:   5\' 11"  (1.803 m)    Filed Weights   10/27/16 1918 10/28/16 0149  Weight: 68.9 kg (152 lb) 69.5 kg (153 lb 4.8 oz)   Body mass index is 21.38 kg/m.   General appearance :Awake, alert, not in any distress. Speech Clear. Not toxic Looking Eyes:, pupils equally reactive to light and accomodation,no scleral icterus.Pink conjunctiva HEENT: Atraumatic and Normocephalic Neck: supple, no JVD. No cervical lymphadenopathy. No thyromegaly Resp:Good air entry bilaterally, no added sounds  CVS: S1 S2 regular, no murmurs.  GI: Bowel sounds present, Non tender and not distended with no gaurding, rigidity or rebound.No organomegaly Extremities: B/L Lower Ext shows no edema, both legs are warm to touch.Small erythematous area (site of needle mark) in the plantar aspect of the left mid forearm-without any obvious induration or infection. Neurology:  speech clear,Non focal, sensation is grossly intact. Psychiatric:. Alert and oriented x 3. Normal mood. Musculoskeletal:No digital cyanosis Skin:No Rash, warm and dry Wounds:N/A  I have personally reviewed following labs and imaging studies  LABORATORY DATA: CBC:  Recent Labs Lab 10/27/16 2010 10/28/16 0530  WBC 3.8* 3.6*  HGB 12.4* 10.6*  HCT 37.9* 32.9*  MCV 90.2 90.9  PLT 49* 50*    Basic Metabolic Panel:  Recent Labs Lab 10/27/16 2010 10/28/16 0530  NA 139 138  K 3.7 4.0  CL 104 106  CO2 29 25  GLUCOSE 98 100*  BUN 8 11  CREATININE 0.93 0.78  CALCIUM 8.3* 7.8*     GFR: Estimated Creatinine Clearance: 112.2 mL/min (by C-G formula based on SCr of 0.78 mg/dL).  Liver Function Tests:  Recent Labs Lab 10/27/16 2010  AST 34  ALT 22  ALKPHOS 108  BILITOT 0.7  PROT 6.8  ALBUMIN 2.7*    Recent Labs Lab 10/27/16 2010  LIPASE 41    Recent Labs Lab 10/27/16 2010  AMMONIA 24    Coagulation Profile:  Recent Labs Lab 10/27/16 2010  INR 1.29    Cardiac Enzymes: No results for input(s): CKTOTAL, CKMB, CKMBINDEX, TROPONINI in the last 168 hours.  BNP (last 3 results) No results for input(s): PROBNP in the last 8760 hours.  HbA1C: No results for input(s): HGBA1C in the last 72 hours.  CBG: No results for input(s): GLUCAP in the last 168 hours.  Lipid Profile: No results for input(s): CHOL, HDL, LDLCALC, TRIG, CHOLHDL, LDLDIRECT in the last 72 hours.  Thyroid Function Tests: No results for input(s): TSH, T4TOTAL, FREET4, T3FREE, THYROIDAB in the last 72 hours.  Anemia Panel: No results for input(s): VITAMINB12, FOLATE, FERRITIN, TIBC, IRON, RETICCTPCT in the last 72 hours.  Urine analysis:    Component Value Date/Time   COLORURINE AMBER (A)  10/28/2016 1200   APPEARANCEUR CLEAR 10/28/2016 1200   LABSPEC 1.022 10/28/2016 1200   PHURINE 6.0 10/28/2016 1200   GLUCOSEU NEGATIVE 10/28/2016 1200   HGBUR NEGATIVE 10/28/2016 1200   BILIRUBINUR NEGATIVE 10/28/2016 1200   KETONESUR NEGATIVE 10/28/2016 1200   PROTEINUR NEGATIVE 10/28/2016 1200   NITRITE NEGATIVE 10/28/2016 1200   LEUKOCYTESUR NEGATIVE 10/28/2016 1200    Sepsis Labs: Lactic Acid, Venous    Component Value Date/Time   LATICACIDVEN 2.31 (HH) 10/27/2016 2025    MICROBIOLOGY: No results found for this or any previous visit (from the past 240 hour(s)).  RADIOLOGY STUDIES/RESULTS: Ct Abdomen Pelvis W Contrast  Result Date: 10/05/2016 CLINICAL DATA:  Abdominal pain and swelling, vomiting blood recent paracentesis EXAM: CT ABDOMEN AND PELVIS WITH CONTRAST  TECHNIQUE: Multidetector CT imaging of the abdomen and pelvis was performed using the standard protocol following bolus administration of intravenous contrast. CONTRAST:  100mL ISOVUE-300 IOPAMIDOL (ISOVUE-300) INJECTION 61% COMPARISON:  09/30/2016, 07/14/2016 FINDINGS: Lower chest: Patchy dependent atelectasis. Nodular atelectasis or scarring in the posterior right lung base. No effusion. Heart size normal. No large pericardial effusion. Hepatobiliary: Nodular contour of the liver consistent with cirrhosis. Heterogenous liver enhancement but without discrete mass. Portal veins and splenic vein appear patent. The gallbladder is dilated but decreased compared to prior. No intra or extrahepatic biliary dilatation. Pancreas: Unremarkable. No pancreatic ductal dilatation or surrounding inflammatory changes. Spleen: Markedly enlarged at 20 cm. Adrenals/Urinary Tract: Adrenal glands are within normal limits. Small calcified stone in the mid to upper right kidney. No hydronephrosis. Bladder is normal. Stomach/Bowel: The stomach is collapsed. There is distal esophageal thickening. No dilated large or small bowel. Probable esophageal varices. Vascular/Lymphatic: Aortic caliber is normal. There is atherosclerosis present. No grossly enlarged lymph nodes. Reproductive: Prostate gland slightly enlarged with few calcifications. Other: No free air. Small to moderate ascites, decreased compared to comparison exam. Musculoskeletal: No acute osseous abnormality. Degenerative changes of the spine with gas within the disc space and posterior to the L4 vertebral body. IMPRESSION: 1. Nodular liver contour or consistent with cirrhosis. Findings consistent with portal hypertension including marked splenomegaly and probable esophageal varices. 2. Small to moderate volume of ascites, decreased compared to prior. 3. Distal esophageal thickening could relate to esophagitis. 4. Dilated gallbladder but decreased compared to prior. No intra or  extrahepatic biliary dilatation. 5. Nonobstructing stone in the right kidney. Electronically Signed   By: Jasmine PangKim  Fujinaga M.D.   On: 10/05/2016 01:51   [place] Paracentesis  Result Date: 10/07/2016 INDICATION: Ascites secondary to alcoholic cirrhosis. Chronic hepatitis B and C. Request for therapeutic paracentesis. EXAM: ULTRASOUND GUIDED RIGHT LATERAL ABDOMEN PARACENTESIS MEDICATIONS: 1% Lidocaine. COMPLICATIONS: None immediate. PROCEDURE: Informed written consent was obtained from the patient after a discussion of the risks, benefits and alternatives to treatment. A timeout was performed prior to the initiation of the procedure. Initial ultrasound scanning demonstrates a moderate amount of ascites within the right lateral abdomen. The right lateral abdomen was prepped and draped in the usual sterile fashion. 1% lidocaine with epinephrine was used for local anesthesia. Following this, a 6 Fr Safe-T-Centesis catheter was introduced. An ultrasound image was saved for documentation purposes. The paracentesis was performed. The catheter was removed and a dressing was applied. The patient tolerated the procedure well without immediate post procedural complication. FINDINGS: A total of approximately 1.45 liters of clear yellow fluid was removed. Samples were sent to the laboratory as requested by the clinical team. IMPRESSION: Successful ultrasound-guided paracentesis yielding 1.45 liters of peritoneal fluid. Read  by:  Corrin Parker, PA-C Electronically Signed   By: Jolaine Click M.D.   On: 10/07/2016 13:26   US Paracentesis  Result Date: 09/30/2016 INDICATION: Cirrhosis, hepatitis B/C, recurrent ascites. Request made for diagnostic and therapeutic paracentesis. EXAM: ULTRASOUND GUIDED DIAGNOSTIC AND THERAPEUTIC PARACENTESIS MEDICATIONS: None. COMPLICATIONS: None immediate. PROCEDURE: Informed written consent was obtained from the patient after a discussion of the risks, benefits and alternatives to treatment. A timeout  was performed prior to the initiation of the procedure. Initial ultrasound scanning demonstrates a small amount of ascites within the left lower abdominal quadrant. The left lower abdomen was prepped and draped in the usual sterile fashion. 1% lidocaine was used for local anesthesia. Following this, a Yueh catheter was introduced. An ultrasound image was saved for documentation purposes. The paracentesis was performed. The catheter was removed and a dressing was applied. The patient tolerated the procedure well without immediate post procedural complication. FINDINGS: A total of approximately 1.3 liters of slightly hazy, light yellow fluid was removed. Samples were sent to the laboratory as requested by the clinical team. IMPRESSION: Successful ultrasound-guided diagnostic and therapeutic paracentesis yielding 1.3 liters liters of peritoneal fluid. Read by: Jeananne Rama, PA-C Electronically Signed   By: Malachy Moan M.D.   On: 09/30/2016 11:25   Dg Chest Port 1 View  Result Date: 10/04/2016 CLINICAL DATA:  Sepsis. EXAM: PORTABLE CHEST 1 VIEW COMPARISON:  09/25/2016 FINDINGS: Cardiomediastinal silhouette is within normal limits. Diffuse interstitial coarsening is unchanged and compatible with chronic lung disease. There is no evidence of acute airspace consolidation, overt edema, pleural effusion, or pneumothorax. No acute osseous abnormality is seen. IMPRESSION: No active disease. Electronically Signed   By: Sebastian Ache M.D.   On: 10/04/2016 22:25     LOS: 0 days   Jeoffrey Massed, MD  [facility] Pager:[phone]  If 7PM-7AM, please contact night-coverage [email or web address] Password [email or web address] 10/28/2016, 1:34 PM

## 2016-10-28 NOTE — ED Notes (Signed)
Pt provided with urinal and made aware of need for urine sample 

## 2016-10-28 NOTE — Consult Note (Signed)
[facility]       Reason for Consult: Candida tropicalis infection    Referring Physician: Dr. Jerral RalphGhimire  Principal Problem:   Ascites Active Problems:   Other cirrhosis of liver (HCC)   Chronic hepatitis B (HCC)   Chronic hepatitis C without hepatic coma (HCC)   Thrombocytopenia (HCC)   . cefTRIAXone (ROCEPHIN)  IV  2 g Intravenous Q24H  . fluconazole (DIFLUCAN) IV  400 mg Intravenous QHS  . furosemide  40 mg Oral Daily  . lactulose  20 g Oral BID  . pantoprazole  40 mg Oral BID  . spironolactone  100 mg Oral Daily  . sucralfate  1 g Oral Q6H    Recommendations: Fluconazole 400 mg daily TEE   repeat blood cultures (done) Agree with opthalmology as outpatient before he completes his treatment course Continue with ceftriaxone pending paracentesis  Dr. Drue SecondSnider on tomorrow  Assessment: He has recent positive blood cultures with C tropicalis and was feeling worse so came back.  Positive blood cultures and not adequately treated.    Also with abdominal pain and concern for SBP.   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  He did not take medications and now back with ascites.  Still with active drug use.  No fever, no chills.  No diarrhea, no SOB.  No chest pain.  Also with cirrhosis.    Review of Systems:  Constitutional: negative for fevers and chills Respiratory: negative for cough Gastrointestinal: positive for abdominal pain, negative for nausea and diarrhea Musculoskeletal: negative for myalgias and arthralgias All other systems reviewed and are negative    Past Medical History:  Diagnosis Date  . Chronic pain   . Cirrhosis of liver (HCC)   . Gastric ulceration   . Hepatitis B   . Hepatitis C   . Polysubstance abuse   . Upper GI bleed     Social History  Substance Use Topics    . Smoking status: Current Every Day Smoker    Packs/day: 0.50    Types: Cigarettes  . Smokeless tobacco: Never Used  . Alcohol use No    Family History  Problem Relation Age of Onset  . Dementia Mother     No Known Allergies  Physical Exam: Constitutional: in no apparent distress  Vitals:   10/28/16 0543 10/28/16 1438  BP: 122/83 129/72  Pulse: 74 85  Resp: 18 17  Temp: 98.9 F (37.2 C) 98.4 F (36.9 C)   EYES: anicteric ENMT: no thrush Cardiovascular: Cor RRR Respiratory: CTA B; normal respiratory effort GI: Bowel sounds are normal Musculoskeletal: no pedal edema noted Skin: negatives: no rash Hematologic: no cervical lad  Lab Results  Component Value Date   WBC 3.6 (L) 10/28/2016   HGB 10.6 (L) 10/28/2016   HCT 32.9 (L) 10/28/2016   MCV 90.9 10/28/2016   PLT 50 (L) 10/28/2016    Lab Results  Component Value Date   CREATININE 0.78 10/28/2016   BUN 11 10/28/2016   NA 138 10/28/2016   K 4.0 10/28/2016   CL 106 10/28/2016   CO2 25 10/28/2016    Lab Results  Component Value Date   ALT 22 10/27/2016   AST 34 10/27/2016   ALKPHOS 108 10/27/2016     Microbiology: No results found for this or any previous  visit (from the past 240 hour(s)).  Staci RighterOMER, Cheikh Bramble, MD [facility] [email or web address] [phone] pager  [phone] cell 10/28/2016, 2:43 PM

## 2016-10-29 ENCOUNTER — Inpatient Hospital Stay (HOSPITAL_COMMUNITY): Payer: Medicaid Other

## 2016-10-29 NOTE — Progress Notes (Signed)
Patient ID: Dan [name], male   DOB: 04/17/1971, [age] y.o.   MRN: [id number]   Request for US paracentesis  Limited US Abd performed No fluid in 4 quads Small pocket near urinary bladder No safe window   No paracentesis performed.

## 2016-10-29 NOTE — Care Management Note (Addendum)
Case Management Note  Patient Details  Name: Dan [name] MRN: [id number] Date of Birth: 04-Feb-1971  Subjective/Objective:      Pt with history of IVDA (crushes opana),  hep B/C, cirrhosis, frequent paracentesis, upper GI bleeding,recent numerous admissions, last admission he was found to have fungemia-he unfortunately left the hospital AGAINST MEDICAL ADVICE on 10/26 before completing antifungal therapy. He was admitted on 11/14, for evaluation of abdominal pain. Resides with mom. Independent with ADL's , no DME PTA.   PCP: Mauricio Poegina [place]  Action/Plan: Plan is to d/c to home when medically stable.CM to f/u with disposition needs. Expected Discharge Date:                  Expected Discharge Plan:  Home/Self Care  In-House Referral:     Discharge planning Services  CM Consult  Post Acute Care Choice:    Choice offered to:     DME Arranged:    DME Agency:     HH Arranged:    HH Agency:     Status of Service:  In process, will continue to follow  If discussed at Long Length of Stay Meetings, dates discussed:    Additional Comments:  10/30/2016 @  0900 Kimberly/[employer] shared with CM [facility] and [employer] for [employer] ([employer])  could  possible help pt with transportation services from [place], [place] to [place], [place] to participate in a methadone program. However, CM learned [facility]  only travels to [place] on Tuesdays and pt will need daily transportation. [employer] will provide pt with transportation services with a small fee( $ 2.50 roundtrip). Pt will be dropped off @ the bus station in [place] and at that point pt will need transportation from bus stop to clinic. CM made pt aware. Pt stated he can't get to clinic from the bus stop. States he still will need help with transportation.    Pt desires to become involved in a methadone program once d/c. MD asked CM to locate METHADONE clinic near pt's home. [place] lives in [place], [place]  [place] [facility] the  closest facility which has a methadone program, however, pt states he will not have transportation [place]. CM reached out to [employer] [facility] [employer]/ [place]ly @ [phone] for possible resources to help facilitate transportation ? to [facility] [place]. CM awaiting call back from [place]. CM to f/u in am.   Epifanio Leschesole, Misty Foutz [place], RN 10/29/2016, 3:14 PM

## 2016-10-29 NOTE — Progress Notes (Signed)
PROGRESS NOTE        PATIENT DETAILS Name: Dan [name] Age: 45 y.o. Sex: male Date of Birth: [birth year] Admit Date: 10/27/2016 Admitting Physician Jonah Blue, MD ZOX:[id number] F, NP  Brief Narrative: Patient is a 45 y.o. male history of IVDA (crushes opana), history of hep B/C, cirrhosis requiring frequent paracentesis, recent history of upper GI bleeding-recent numerous admissions, last admission he was found to have fungemia-he unfortunately left the hospital AGAINST MEDICAL ADVICE on 10/26 before completing antifungal therapy. He was admitted on 11/14, for evaluation of abdominal pain.  Subjective: Patient is sitting in bed comfortably and in no acute distress.  Denies nausea, vomiting, diarrhea. He has acknowledged active IVDA with Opana but a desire for methadone clinic program.  Assessment/Plan:  Recent Candida fungemia (positive blood culture on 10/04/16): Continue fluconazole, have consulted infectious disease-patient has history of active IVDA. Recent transthoracic echocardiogram on 10/25 negative for vegetations. Reviewed prior records, initially thought to have fungemia from SBP-however with patient acknowledging IVDA-feel that this probably is the likely source of infection. Infectious disease recommends TEE (have consulted cards-likely scheduled for 11/17), paracentesis, continue fluconazole and ceftriaxone for now. Will likely need ophthalmology evaluation at some point-perhaps can be done in the outpatient setting. Blood cultures pending.  Abdominal pain: Abdomen is soft-he does appear to have ascites but it is not tense. Pain is mostly chronic, but given history of cirrhosis, it is probably reasonable to perform a paracentesis to rule out SBP. Continue Rocephin for now.  Cirrhosis: Relatively well compensated, continue Lasix and Aldactone. Follow.  Chronic pain syndrome: Has mostly chronic abdominal pain-unfortunately IVDA with crushed  opana-await paracentesis to rule out SBP-he is at risk of having withdrawal symptoms-continue with as needed narcotics for now. May benefit from initiation into a methadone program as an outpatient. Have asked case management to see if we can get this patient connected to a local methadone clinic near his home  Thrombocytopenia: Stable-likely chronic and secondary to liver cirrhosis  Chronic hep B and hep C: Needs to demonstrate compliance to medications/follow-up and abstinence from drug use-before even considering outpatient referral to the hepatology clinic.  Recent upper GI bleed due to a large gastric ulcer (August 2017): Repeat endoscopy on 10/26 negative for ulcer. Continue PPI.  Active history of IVDA: Acknowledges that he injects crushed opana-counseled extensively. May benefit from initiation into a methadone program as an outpatient. See above.   DVT Prophylaxis: SCD's-given thrombocytopenia  Code Status: Full code   Family Communication: None at bedside  Disposition Plan: Remain inpatient-requires several more days of hospitalization before consideration of discharge  Antimicrobial agents: See below  Procedures: TEE planned Paracentesis planned  CONSULTS:  ID  Time spent: 25 minutes-Greater than 50% of this time was spent in counseling, explanation of diagnosis, planning of further management, and coordination of care.  MEDICATIONS: Anti-infectives    Start     Dose/Rate Route Frequency Ordered Stop   10/28/16 2200  cefTRIAXone (ROCEPHIN) 2 g in dextrose 5 % 50 mL IVPB     2 g 100 mL/hr over 30 Minutes Intravenous Every 24 hours 10/28/16 0027     10/28/16 2200  fluconazole (DIFLUCAN) IVPB 400 mg     400 mg 100 mL/hr over 120 Minutes Intravenous Daily at bedtime 10/28/16 1443     10/28/16 0100  fluconazole (DIFLUCAN) IVPB 200 mg  Status:  Discontinued     200 mg 100 mL/hr over 60 Minutes Intravenous Daily at bedtime 10/28/16 0027 10/28/16 1443    10/27/16 2300  cefTRIAXone (ROCEPHIN) 2 g in dextrose 5 % 50 mL IVPB     2 g 100 mL/hr over 30 Minutes Intravenous  Once 10/27/16 2257 10/28/16 0041      Scheduled Meds: . cefTRIAXone (ROCEPHIN)  IV  2 g Intravenous Q24H  . fluconazole (DIFLUCAN) IV  400 mg Intravenous QHS  . furosemide  40 mg Oral Daily  . lactulose  20 g Oral BID  . pantoprazole  40 mg Oral BID  . spironolactone  100 mg Oral Daily  . sucralfate  1 g Oral Q6H   Continuous Infusions: PRN Meds:.acetaminophen **OR** acetaminophen, ondansetron **OR** ondansetron (ZOFRAN) IV, oxyCODONE   PHYSICAL EXAM: Vital signs: Vitals:   10/28/16 0543 10/28/16 1438 10/28/16 2114 10/29/16 0559  BP: 122/83 129/72 108/73 114/70  Pulse: 74 85 80 (!) 44  Resp: 18 17 17 18   Temp: 98.9 F (37.2 C) 98.4 F (36.9 C) 98.3 F (36.8 C) 97.9 F (36.6 C)  TempSrc: Oral Oral Oral Oral  SpO2: 95% 96% 96% 96%  Weight:      Height:       Filed Weights   10/27/16 1918 10/28/16 0149  Weight: 68.9 kg (152 lb) 69.5 kg (153 lb 4.8 oz)   Body mass index is 21.38 kg/m.   General appearance :Awake, alert, not in any distress. Speech Clear. Not toxic Looking Eyes:, pupils equally reactive to light and accomodation,no scleral icterus.Pink conjunctiva HEENT: Atraumatic and Normocephalic Neck: supple, no JVD. No cervical lymphadenopathy. No thyromegaly Resp:Good air entry bilaterally, no added sounds  CVS: S1 S2 regular, no murmurs.  GI: Bowel sounds present, Non tender and not distended with no gaurding, rigidity or rebound.No organomegaly Extremities: B/L Lower Ext shows no edema, both legs are warm to touch.Small erythematous area (site of needle mark) in the plantar aspect of the left mid forearm-without any obvious induration or infection. Neurology:  speech clear,Non focal, sensation is grossly intact. Psychiatric: Normal judgment and insight. Alert and oriented x 3. Normal mood. Musculoskeletal:gait appears to be normal.No digital  cyanosis Skin:No Rash, warm and dry Wounds:N/A  I have personally reviewed following labs and imaging studies  LABORATORY DATA: CBC:  Recent Labs Lab 10/27/16 2010 10/28/16 0530  WBC 3.8* 3.6*  HGB 12.4* 10.6*  HCT 37.9* 32.9*  MCV 90.2 90.9  PLT 49* 50*    Basic Metabolic Panel:  Recent Labs Lab 10/27/16 2010 10/28/16 0530  NA 139 138  K 3.7 4.0  CL 104 106  CO2 29 25  GLUCOSE 98 100*  BUN 8 11  CREATININE 0.93 0.78  CALCIUM 8.3* 7.8*    GFR: Estimated Creatinine Clearance: 112.2 mL/min (by C-G formula based on SCr of 0.78 mg/dL).  Liver Function Tests:  Recent Labs Lab 10/27/16 2010  AST 34  ALT 22  ALKPHOS 108  BILITOT 0.7  PROT 6.8  ALBUMIN 2.7*    Recent Labs Lab 10/27/16 2010  LIPASE 41    Recent Labs Lab 10/27/16 2010  AMMONIA 24    Coagulation Profile:  Recent Labs Lab 10/27/16 2010  INR 1.29    Cardiac Enzymes: No results for input(s): CKTOTAL, CKMB, CKMBINDEX, TROPONINI in the last 168 hours.  BNP (last 3 results) No results for input(s): PROBNP in the last 8760 hours.  HbA1C: No results for input(s): HGBA1C in the last 72 hours.  CBG: No  results for input(s): GLUCAP in the last 168 hours.  Lipid Profile: No results for input(s): CHOL, HDL, LDLCALC, TRIG, CHOLHDL, LDLDIRECT in the last 72 hours.  Thyroid Function Tests: No results for input(s): TSH, T4TOTAL, FREET4, T3FREE, THYROIDAB in the last 72 hours.  Anemia Panel: No results for input(s): VITAMINB12, FOLATE, FERRITIN, TIBC, IRON, RETICCTPCT in the last 72 hours.  Urine analysis:    Component Value Date/Time   COLORURINE AMBER (A) 10/28/2016 1200   APPEARANCEUR CLEAR 10/28/2016 1200   LABSPEC 1.022 10/28/2016 1200   PHURINE 6.0 10/28/2016 1200   GLUCOSEU NEGATIVE 10/28/2016 1200   HGBUR NEGATIVE 10/28/2016 1200   BILIRUBINUR NEGATIVE 10/28/2016 1200   KETONESUR NEGATIVE 10/28/2016 1200   PROTEINUR NEGATIVE 10/28/2016 1200   NITRITE NEGATIVE  10/28/2016 1200   LEUKOCYTESUR NEGATIVE 10/28/2016 1200    Sepsis Labs: Lactic Acid, Venous    Component Value Date/Time   LATICACIDVEN 2.31 (HH) 10/27/2016 2025    MICROBIOLOGY: Recent Results (from the past 240 hour(s))  Culture, blood (routine x 2)     Status: None (Preliminary result)   Collection Time: 10/27/16  9:04 PM  Result Value Ref Range Status   Specimen Description BLOOD RIGHT ARM  Final   Special Requests BLOOD AEROBIC BOTTLE 5CC  Final   Culture NO GROWTH < 24 HOURS  Final   Report Status PENDING  Incomplete    RADIOLOGY STUDIES/RESULTS: Ct Abdomen Pelvis W Contrast  Result Date: 10/05/2016 CLINICAL DATA:  Abdominal pain and swelling, vomiting blood recent paracentesis EXAM: CT ABDOMEN AND PELVIS WITH CONTRAST TECHNIQUE: Multidetector CT imaging of the abdomen and pelvis was performed using the standard protocol following bolus administration of intravenous contrast. CONTRAST:  ISOVUE-300 IOPAMIDOL (ISOVUE-300) INJECTION 61% COMPARISON:  09/30/2016, 07/14/2016 FINDINGS: Lower chest: Patchy dependent atelectasis. Nodular atelectasis or scarring in the posterior right lung base. No effusion. Heart size normal. No large pericardial effusion. Hepatobiliary: Nodular contour of the liver consistent with cirrhosis. Heterogenous liver enhancement but without discrete mass. Portal veins and splenic vein appear patent. The gallbladder is dilated but decreased compared to prior. No intra or extrahepatic biliary dilatation. Pancreas: Unremarkable. No pancreatic ductal dilatation or surrounding inflammatory changes. Spleen: Markedly enlarged at 20 cm. Adrenals/Urinary Tract: Adrenal glands are within normal limits. Small calcified stone in the mid to upper right kidney. No hydronephrosis. Bladder is normal. Stomach/Bowel: The stomach is collapsed. There is distal esophageal thickening. No dilated large or small bowel. Probable esophageal varices. Vascular/Lymphatic: Aortic caliber  is normal. There is atherosclerosis present. No grossly enlarged lymph nodes. Reproductive: Prostate gland slightly enlarged with few calcifications. Other: No free air. Small to moderate ascites, decreased compared to comparison exam. Musculoskeletal: No acute osseous abnormality. Degenerative changes of the spine with gas within the disc space and posterior to the L4 vertebral body. IMPRESSION: 1. Nodular liver contour or consistent with cirrhosis. Findings consistent with portal hypertension including marked splenomegaly and probable esophageal varices. 2. Small to moderate volume of ascites, decreased compared to prior. 3. Distal esophageal thickening could relate to esophagitis. 4. Dilated gallbladder but decreased compared to prior. No intra or extrahepatic biliary dilatation. 5. Nonobstructing stone in the right kidney. Electronically Signed   By: Jasmine Pang M.D.   On: 10/05/2016 01:51   US Paracentesis  Result Date: 10/07/2016 INDICATION: Ascites secondary to alcoholic cirrhosis. Chronic hepatitis B and C. Request for therapeutic paracentesis. EXAM: ULTRASOUND GUIDED RIGHT LATERAL ABDOMEN PARACENTESIS MEDICATIONS: 1% Lidocaine. COMPLICATIONS: None immediate. PROCEDURE: Informed written consent was obtained from the  patient after a discussion of the risks, benefits and alternatives to treatment. A timeout was performed prior to the initiation of the procedure. Initial ultrasound scanning demonstrates a moderate amount of ascites within the right lateral abdomen. The right lateral abdomen was prepped and draped in the usual sterile fashion. 1% lidocaine with epinephrine was used for local anesthesia. Following this, a 6 Fr Safe-T-Centesis catheter was introduced. An ultrasound image was saved for documentation purposes. The paracentesis was performed. The catheter was removed and a dressing was applied. The patient tolerated the procedure well without immediate post procedural complication. FINDINGS: A  total of approximately 1.45 liters of clear yellow fluid was removed. Samples were sent to the laboratory as requested by the clinical team. IMPRESSION: Successful ultrasound-guided paracentesis yielding 1.45 liters of peritoneal fluid. Read by:  Corrin Parker, PA-C Electronically Signed   By: Jolaine Click M.D.   On: 10/07/2016 13:26   US Paracentesis  Result Date: 09/30/2016 INDICATION: Cirrhosis, hepatitis B/C, recurrent ascites. Request made for diagnostic and therapeutic paracentesis. EXAM: ULTRASOUND GUIDED DIAGNOSTIC AND THERAPEUTIC PARACENTESIS MEDICATIONS: None. COMPLICATIONS: None immediate. PROCEDURE: Informed written consent was obtained from the patient after a discussion of the risks, benefits and alternatives to treatment. A timeout was performed prior to the initiation of the procedure. Initial ultrasound scanning demonstrates a small amount of ascites within the left lower abdominal quadrant. The left lower abdomen was prepped and draped in the usual sterile fashion. 1% lidocaine was used for local anesthesia. Following this, a Yueh catheter was introduced. An ultrasound image was saved for documentation purposes. The paracentesis was performed. The catheter was removed and a dressing was applied. The patient tolerated the procedure well without immediate post procedural complication. FINDINGS: A total of approximately 1.3 liters of slightly hazy, light yellow fluid was removed. Samples were sent to the laboratory as requested by the clinical team. IMPRESSION: Successful ultrasound-guided diagnostic and therapeutic paracentesis yielding 1.3 liters liters of peritoneal fluid. Read by: Jeananne Rama, PA-C Electronically Signed   By: Malachy Moan M.D.   On: 09/30/2016 11:25   Dg Chest Port 1 View  Result Date: 10/04/2016 CLINICAL DATA:  Sepsis. EXAM: PORTABLE CHEST 1 VIEW COMPARISON:  09/25/2016 FINDINGS: Cardiomediastinal silhouette is within normal limits. Diffuse interstitial coarsening  is unchanged and compatible with chronic lung disease. There is no evidence of acute airspace consolidation, overt edema, pleural effusion, or pneumothorax. No acute osseous abnormality is seen. IMPRESSION: No active disease. Electronically Signed   By: Sebastian Ache M.D.   On: 10/04/2016 22:25     LOS: 1 day   Gerald Dexter, PA-S  [facility] Pager:[phone]  If 7PM-7AM, please contact night-coverage [email or web address] Password [facility] 10/29/2016, 1:02 PM  Attending MD note  Patient was seen, examined,treatment plan was discussed with the PA-S.  I have personally reviewed the clinical findings, lab, imaging studies and management of this patient in detail. I agree with the documentation, as recorded by the PA-S.   Patient is lying in bed, pain in his abdomen is much better controlled. He does not appear to be in any distress.  On Exam: Gen. exam: Awake, alert, not in any distress Chest: Good air entry bilaterally, no rhonchi or rales CVS: S1-S2 regular, no murmurs Abdomen: Soft, nontender and nondistended Neurology: Non-focal Skin: No rash or lesions  Impression:  Candida fungemia Chronic hep B and hepatitis C Liver cirrhosis Active IVDA Chronic pain syndrome  Plan: Continue fluconazole-await TEE Continue Rocephin-doubt SBP-but awaiting on paracentesis results. Have asked case  management to see if patient can be connected to a methadone clinic near his home Rest as above  [facility] HospitalGHIMIRE,Joshus Rogan [facility]

## 2016-10-29 NOTE — Progress Notes (Signed)
    [facility] has been requested to perform a transesophageal echocardiogram on Dan [name] for bacteremia.  After careful review of history and examination, the risks and benefits of transesophageal echocardiogram have been explained including risks of esophageal damage, perforation (1:10,000 risk), bleeding, pharyngeal hematoma as well as other potential complications associated with conscious sedation including aspiration, arrhythmia, respiratory failure and death. Alternatives to treatment were discussed, questions were answered. Patient is willing to proceed.   Nada BoozerLaura Ingold, NP  10/29/2016 1:44 PM

## 2016-10-30 ENCOUNTER — Inpatient Hospital Stay (HOSPITAL_COMMUNITY): Payer: Medicaid Other | Admitting: Certified Registered Nurse Anesthetist

## 2016-10-30 ENCOUNTER — Encounter (HOSPITAL_COMMUNITY)
Admission: EM | Disposition: A | Discharge status: Home / Self Care | Payer: Self-pay | Source: Home / Self Care | Attending: Internal Medicine

## 2016-10-30 ENCOUNTER — Inpatient Hospital Stay (HOSPITAL_COMMUNITY): Payer: Medicaid Other

## 2016-10-30 ENCOUNTER — Encounter (HOSPITAL_COMMUNITY): Payer: Self-pay | Admitting: Certified Registered Nurse Anesthetist

## 2016-10-30 DIAGNOSIS — I38 Endocarditis, valve unspecified: Secondary | ICD-10-CM

## 2016-10-30 HISTORY — PX: TEE WITHOUT CARDIOVERSION: SHX5443

## 2016-10-30 LAB — CBC
HEMATOCRIT: 35 % — AB (ref 39.0–52.0)
HEMOGLOBIN: 11.3 g/dL — AB (ref 13.0–17.0)
MCH: 29.3 pg (ref 26.0–34.0)
MCHC: 32.3 g/dL (ref 30.0–36.0)
MCV: 90.7 fL (ref 78.0–100.0)
Platelets: 54 10*3/uL — ABNORMAL LOW (ref 150–400)
RBC: 3.86 MIL/uL — AB (ref 4.22–5.81)
RDW: 15 % (ref 11.5–15.5)
WBC: 4 10*3/uL (ref 4.0–10.5)

## 2016-10-30 LAB — BASIC METABOLIC PANEL
Anion gap: 7 (ref 5–15)
BUN: 19 mg/dL (ref 6–20)
CHLORIDE: 101 mmol/L (ref 101–111)
CO2: 27 mmol/L (ref 22–32)
Calcium: 8.2 mg/dL — ABNORMAL LOW (ref 8.9–10.3)
Creatinine, Ser: 0.75 mg/dL (ref 0.61–1.24)
GFR calc non Af Amer: >60 mL/min (ref >60)
Glucose, Bld: 117 mg/dL — ABNORMAL HIGH (ref 65–99)
POTASSIUM: 4.1 mmol/L (ref 3.5–5.1)
SODIUM: 135 mmol/L (ref 135–145)

## 2016-10-30 LAB — PROTIME-INR
INR: 1.26
PROTHROMBIN TIME: 15.8 s — AB (ref 11.4–15.2)

## 2016-10-30 SURGERY — ECHOCARDIOGRAM, TRANSESOPHAGEAL
Anesthesia: Monitor Anesthesia Care

## 2016-10-30 MED ORDER — FLUCONAZOLE IN SODIUM CHLORIDE 400-0.9 MG/200ML-% IV SOLN
400.0000 mg | Freq: Every day | INTRAVENOUS | Status: DC
  Administered 2016-10-30: 400 mg via INTRAVENOUS
  Filled 2016-10-30 (×2): qty 200

## 2016-10-30 MED ORDER — MORPHINE SULFATE (PF) 2 MG/ML IV SOLN
1.0000 mg | INTRAVENOUS | Status: DC | PRN
  Administered 2016-10-30: 1 mg via INTRAVENOUS
  Filled 2016-10-30: qty 1

## 2016-10-30 MED ORDER — PANTOPRAZOLE SODIUM 40 MG PO TBEC
40.0000 mg | DELAYED_RELEASE_TABLET | Freq: Two times a day (BID) | ORAL | Status: DC
  Administered 2016-10-30 – 2016-10-31 (×2): 40 mg via ORAL
  Filled 2016-10-30 (×2): qty 1

## 2016-10-30 MED ORDER — PROPOFOL 10 MG/ML IV BOLUS
INTRAVENOUS | Status: DC | PRN
  Administered 2016-10-30: 50 mg via INTRAVENOUS
  Administered 2016-10-30: 75 mg via INTRAVENOUS

## 2016-10-30 MED ORDER — SODIUM CHLORIDE 0.9 % IV SOLN
INTRAVENOUS | Status: DC
  Administered 2016-10-30 (×2): via INTRAVENOUS

## 2016-10-30 MED ORDER — LACTULOSE 10 GM/15ML PO SOLN
20.0000 g | Freq: Every day | ORAL | Status: DC
  Administered 2016-10-30 – 2016-10-31 (×2): 20 g via ORAL
  Filled 2016-10-30 (×2): qty 30

## 2016-10-30 MED ORDER — ZOLPIDEM TARTRATE 5 MG PO TABS
5.0000 mg | ORAL_TABLET | Freq: Once | ORAL | Status: AC
  Administered 2016-10-30: 5 mg via ORAL
  Filled 2016-10-30: qty 1

## 2016-10-30 MED ORDER — SUCRALFATE 1 GM/10ML PO SUSP
1.0000 g | Freq: Four times a day (QID) | ORAL | Status: DC
  Administered 2016-10-30 – 2016-10-31 (×4): 1 g via ORAL
  Filled 2016-10-30 (×3): qty 10

## 2016-10-30 MED ORDER — MORPHINE SULFATE (PF) 2 MG/ML IV SOLN
1.0000 mg | INTRAVENOUS | Status: DC | PRN
Start: 2016-10-30 — End: 2016-10-31
  Administered 2016-10-30 – 2016-10-31 (×6): 1 mg via INTRAVENOUS
  Filled 2016-10-30 (×6): qty 1

## 2016-10-30 MED ORDER — PROPOFOL 500 MG/50ML IV EMUL
INTRAVENOUS | Status: DC | PRN
  Administered 2016-10-30: 100 ug/kg/min via INTRAVENOUS

## 2016-10-30 MED ORDER — SPIRONOLACTONE 25 MG PO TABS
100.0000 mg | ORAL_TABLET | Freq: Every day | ORAL | Status: DC
  Administered 2016-10-31: 100 mg via ORAL
  Filled 2016-10-30: qty 4

## 2016-10-30 MED ORDER — OXYCODONE HCL 5 MG PO TABS
5.0000 mg | ORAL_TABLET | Freq: Four times a day (QID) | ORAL | Status: DC | PRN
  Administered 2016-10-30 – 2016-10-31 (×4): 5 mg via ORAL
  Filled 2016-10-30 (×4): qty 1

## 2016-10-30 MED ORDER — FUROSEMIDE 40 MG PO TABS
40.0000 mg | ORAL_TABLET | Freq: Every day | ORAL | Status: DC
  Administered 2016-10-31: 40 mg via ORAL
  Filled 2016-10-30: qty 1

## 2016-10-30 MED ORDER — DEXTROSE 5 % IV SOLN
2.0000 g | Freq: Every day | INTRAVENOUS | Status: DC
  Administered 2016-10-30: 2 g via INTRAVENOUS
  Filled 2016-10-30 (×2): qty 2

## 2016-10-30 MED ORDER — HEPARIN SODIUM (PORCINE) 5000 UNIT/ML IJ SOLN
5000.0000 [IU] | Freq: Three times a day (TID) | INTRAMUSCULAR | Status: DC
  Administered 2016-10-30 – 2016-10-31 (×2): 5000 [IU] via SUBCUTANEOUS
  Filled 2016-10-30 (×2): qty 1

## 2016-10-30 MED ORDER — ONDANSETRON HCL 4 MG PO TABS: 4.0000 mg | ORAL_TABLET | Freq: Three times a day (TID) | ORAL | Status: DC | PRN

## 2016-10-30 MED ORDER — EPHEDRINE SULFATE-NACL 50-0.9 MG/10ML-% IV SOSY
PREFILLED_SYRINGE | INTRAVENOUS | Status: DC | PRN
  Administered 2016-10-30: 10 mg via INTRAVENOUS

## 2016-10-30 NOTE — Interval H&P Note (Signed)
History and Physical Interval Note:  10/30/2016 1:00 PM  Dan [name]  has presented today for surgery, with the diagnosis of BACTEREMIA  The various methods of treatment have been discussed with the patient and family. After consideration of risks, benefits and other options for treatment, the patient has consented to  Procedure(s): TRANSESOPHAGEAL ECHOCARDIOGRAM (TEE) (N/A) as a surgical intervention .  The patient's history has been reviewed, patient examined, no change in status, stable for surgery.  I have reviewed the patient's chart and labs.  Questions were answered to the patient's satisfaction.     Dietrich PatesPaula Cynai Skeens

## 2016-10-30 NOTE — Progress Notes (Signed)
PROGRESS NOTE    Dan [name]  MRN:[id number] DOB: 01/21/1971 DOA: 10/27/2016 PCP: YORK,[name], NP     Brief Narrative:  Patient is a 45 y.o.malehistory of IVDA (crushes opana), history of hep B/C, cirrhosis requiring frequent paracentesis, recent history of upper GI bleeding-recent numerous admissions, last admission he was found to have fungemia-he unfortunately left the hospital AGAINST MEDICAL ADVICE on 10/26 before completing antifungal therapy. He was admitted on 11/14, for evaluation of abdominal pain.  Assessment & Plan:   Principal Problem:   Ascites Active Problems:   Other cirrhosis of liver (HCC)   Chronic hepatitis B (HCC)   Chronic hepatitis C without hepatic coma (HCC)   Thrombocytopenia (HCC)  Recent Candida fungemia(positive blood culture on 10/04/16):  -Continue fluconazole, have consulted infectious disease-patient has history of active IVDA. Recent transthoracic echocardiogram on 10/25 negative for vegetations. Reviewed prior records, initially thought to have fungemia from SBP-however with patient acknowledging IVDA-feel that this probably is the likely source of infection. Infectious disease recommends. TEE scheduled for today. Paracentesis canceled as there was limited fluid for procedure. Continue fluconazole and ceftriaxone for now. Will likely need ophthalmology evaluation at some point-perhaps can be done in the outpatient setting. Blood cultures pending.  Abdominal pain:Abdomen is soft-he does appear to have ascites but it is not tense. Pain is mostly chronic. Paracentesis canceled due to limited fluid for procedure. Continue Rocephin for now.   Cirrhosis:Relatively well compensated, continue Lasix and Aldactone, lactulose. Follow.  Chronic pain syndrome: Has mostly chronic abdominal pain-unfortunately IVDA with crushed opana-he is at risk of having withdrawal symptoms-continue with as needed narcotics for now. Maybenefit from initiation into a  methadone program as an outpatient. Have asked case management to see if we can get this patient connected to a local methadone clinic near his home  Thrombocytopenia:Stable-likely chronic and secondary to liver cirrhosis  Chronic hep B and hep C:Needs to demonstrate compliance to medications/follow-up and abstinence from drug use-before even considering outpatient referral to the hepatology clinic.  Recent upper GI bleed due to a large gastric ulcer (August 2017): Repeat endoscopy on 10/26 negative for ulcer. Continue PPI.  Active history of IVDA:Acknowledges that he injects crushed opana-counseled extensively. Maybenefit from initiation into a methadone program as an outpatient. See above.    DVT prophylaxis: SCDs Code Status: Full Family Communication: no family at bedside Disposition Plan: TEE today    Consultants:   ID  Cardiology for TEE   Procedures:   TEE 11/17  Antimicrobials:   Diflucan >>  Rocephin >>    Subjective: Patient complaining of abdominal pain that is chronic. No new complaints; denies any fevers, chills, chest pain, SOB, nausea or vomiting.   Objective: Vitals:   10/29/16 0559 10/29/16 1517 10/29/16 2111 10/30/16 0559  BP: 114/70 103/71 113/69 107/61  Pulse: (!) 44 76 83 70  Resp: 18 18 18 18  Temp: 97.9 F (36.6 C) 98.4 F (36.9 C) 98.7 F (37.1 C) 97.9 F (36.6 C)  TempSrc: Oral Oral Oral Oral  SpO2: 96% 100% 95% 95%  Weight:      Height:        Intake/Output Summary (Last 24 hours) at 10/30/16 1000 Last data filed at 10/30/16 0651  Gross per 24 hour  Intake             1890 ml  Output                0 ml  Net               1890 ml   Filed Weights   10/27/16 1918 10/28/16 0149  Weight: 68.9 kg (152 lb) 69.5 kg (153 lb 4.8 oz)    Examination:  General exam: Appears calm and comfortable  Respiratory system: Clear to auscultation. Respiratory effort normal. Cardiovascular system: S1 & S2 heard, RRR. No JVD, murmurs, rubs,  gallops or clicks. No pedal edema. Gastrointestinal system: Abdomen is nondistended, soft and nontender. +periumbilical hernia. No organomegaly or masses felt. Normal bowel sounds heard. Central nervous system: Alert and oriented. No focal neurological deficits. Extremities: Symmetric 5 x 5 power. Skin: No rashes, lesions or ulcers Psychiatry: Judgement and insight appear normal. Mood & affect appropriate.   Data Reviewed: I have personally reviewed following labs and imaging studies  CBC:  Recent Labs Lab 10/27/16 2010 10/28/16 0530 10/30/16 0612  WBC 3.8* 3.6* 4.0  HGB 12.4* 10.6* 11.3*  HCT 37.9* 32.9* 35.0*  MCV 90.2 90.9 90.7  PLT 49* 50* 54*   Basic Metabolic Panel:  Recent Labs Lab 10/27/16 2010 10/28/16 0530 10/30/16 0612  NA 139 138 135  K 3.7 4.0 4.1  CL 104 106 101  CO2 29 25 27  GLUCOSE 98 100* 117*  BUN 8 11 19  CREATININE 0.93 0.78 0.75  CALCIUM 8.3* 7.8* 8.2*   GFR: Estimated Creatinine Clearance: 112.2 mL/min (by C-G formula based on SCr of 0.75 mg/dL). Liver Function Tests:  Recent Labs Lab 10/27/16 2010  AST 34  ALT 22  ALKPHOS 108  BILITOT 0.7  PROT 6.8  ALBUMIN 2.7*    Recent Labs Lab 10/27/16 2010  LIPASE 41    Recent Labs Lab 10/27/16 2010  AMMONIA 24   Coagulation Profile:  Recent Labs Lab 10/27/16 2010 10/30/16 0612  INR 1.29 1.26   Cardiac Enzymes: No results for input(s): CKTOTAL, CKMB, CKMBINDEX, TROPONINI in the last 168 hours. BNP (last 3 results) No results for input(s): PROBNP in the last 8760 hours. HbA1C: No results for input(s): HGBA1C in the last 72 hours. CBG: No results for input(s): GLUCAP in the last 168 hours. Lipid Profile: No results for input(s): CHOL, HDL, LDLCALC, TRIG, CHOLHDL, LDLDIRECT in the last 72 hours. Thyroid Function Tests: No results for input(s): TSH, T4TOTAL, FREET4, T3FREE, THYROIDAB in the last 72 hours. Anemia Panel: No results for input(s): VITAMINB12, FOLATE, FERRITIN,  TIBC, IRON, RETICCTPCT in the last 72 hours. Sepsis Labs:  Recent Labs Lab 10/27/16 2025  LATICACIDVEN 2.31*    Recent Results (from the past 240 hour(s))  Culture, blood (routine x 2)     Status: None (Preliminary result)   Collection Time: 10/27/16  8:10 PM  Result Value Ref Range Status   Specimen Description BLOOD LEFT UPPER ARM  Final   Special Requests BOTTLES DRAWN AEROBIC AND ANAEROBIC 5CC  Final   Culture NO GROWTH 1 DAY  Final   Report Status PENDING  Incomplete  Culture, blood (routine x 2)     Status: None (Preliminary result)   Collection Time: 10/27/16  9:04 PM  Result Value Ref Range Status   Specimen Description BLOOD RIGHT ARM  Final   Special Requests BLOOD AEROBIC BOTTLE 5CC  Final   Culture NO GROWTH 2 DAYS  Final   Report Status PENDING  Incomplete       Radiology Studies: Us Abdomen Limited  Result Date: 10/29/2016 CLINICAL DATA:  Bowel distention EXAM: LIMITED ABDOMEN ULTRASOUND FOR ASCITES TECHNIQUE: Limited ultrasound survey for ascites was performed in all four abdominal quadrants. COMPARISON:  October 07, 2016 FINDINGS:   There is minimal ascites. There are multiple loops of peristalsing bowel. Splenic enlargement again noted. IMPRESSION: Enlarged spleen.  Minimal ascites. Electronically Signed   By: William  Woodruff III M.D.   On: 10/29/2016 16:29      Scheduled Meds: . cefTRIAXone (ROCEPHIN)  IV  2 g Intravenous Q24H  . fluconazole (DIFLUCAN) IV  400 mg Intravenous QHS  . furosemide  40 mg Oral Daily  . lactulose  20 g Oral BID  . pantoprazole  40 mg Oral BID  . spironolactone  100 mg Oral Daily  . sucralfate  1 g Oral Q6H   Continuous Infusions: . sodium chloride       LOS: 2 days    Time spent: 40 minutes   Tylisa Alcivar, DO [facility] [email or web address] Password [email or web address] 10/30/2016, 10:00 AM   

## 2016-10-30 NOTE — Interval H&P Note (Signed)
History and Physical Interval Note:  10/30/2016 10:06 AM  Dan [name]  has presented today for surgery, with the diagnosis of BACTEREMIA  The various methods of treatment have been discussed with the patient and family. After consideration of risks, benefits and other options for treatment, the patient has consented to  Procedure(s): TRANSESOPHAGEAL ECHOCARDIOGRAM (TEE) (N/A) as a surgical intervention .  The patient's history has been reviewed, patient examined, no change in status, stable for surgery.  I have reviewed the patient's chart and labs.  Questions were answered to the patient's satisfaction.     Dietrich PatesPaula Evelin Cake

## 2016-10-30 NOTE — Anesthesia Preprocedure Evaluation (Signed)
Anesthesia Evaluation  Patient identified by MRN, date of birth, ID band Patient awake    Reviewed: Allergy & Precautions, NPO status , Patient's Chart, lab work & pertinent test results  Airway Mallampati: II  TM Distance: >3 FB Neck ROM: Full    Dental no notable dental hx.    Pulmonary Current Smoker,    Pulmonary exam normal breath sounds clear to auscultation       Cardiovascular negative cardio ROS Normal cardiovascular exam Rhythm:Regular Rate:Normal     Neuro/Psych negative neurological ROS  negative psych ROS   GI/Hepatic negative GI ROS, (+) Cirrhosis     substance abuse  , Hepatitis -, B, C  Endo/Other  negative endocrine ROS  Renal/GU negative Renal ROS  negative genitourinary   Musculoskeletal negative musculoskeletal ROS (+)   Abdominal   Peds negative pediatric ROS (+)  Hematology PLT 54   Anesthesia Other Findings   Reproductive/Obstetrics negative OB ROS                             Anesthesia Physical Anesthesia Plan  ASA: III  Anesthesia Plan: MAC   Post-op Pain Management:    Induction:   Airway Management Planned: Nasal Cannula  Additional Equipment:   Intra-op Plan:   Post-operative Plan:   Informed Consent: I have reviewed the patients History and Physical, chart, labs and discussed the procedure including the risks, benefits and alternatives for the proposed anesthesia with the patient or authorized representative who has indicated his/her understanding and acceptance.   Dental advisory given  Plan Discussed with: CRNA  Anesthesia Plan Comments:         Anesthesia Quick Evaluation

## 2016-10-30 NOTE — H&P (View-Only) (Signed)
  [facility]       Reason for Consult: Candida tropicalis infection    Referring Physician: Dr. Ghimire  Principal Problem:   Ascites Active Problems:   Other cirrhosis of liver (HCC)   Chronic hepatitis B (HCC)   Chronic hepatitis C without hepatic coma (HCC)   Thrombocytopenia (HCC)   . cefTRIAXone (ROCEPHIN)  IV  2 g Intravenous Q24H  . fluconazole (DIFLUCAN) IV  400 mg Intravenous QHS  . furosemide  40 mg Oral Daily  . lactulose  20 g Oral BID  . pantoprazole  40 mg Oral BID  . spironolactone  100 mg Oral Daily  . sucralfate  1 g Oral Q6H    Recommendations: Fluconazole 400 mg daily TEE   repeat blood cultures (done) Agree with opthalmology as outpatient before he completes his treatment course Continue with ceftriaxone pending paracentesis  Dr. Snider on tomorrow  Assessment: He has recent positive blood cultures with C tropicalis and was feeling worse so came back.  Positive blood cultures and not adequately treated.    Also with abdominal pain and concern for SBP.   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  He did not take medications and now back with ascites.  Still with active drug use.  No fever, no chills.  No diarrhea, no SOB.  No chest pain.  Also with cirrhosis.    Review of Systems:  Constitutional: negative for fevers and chills Respiratory: negative for cough Gastrointestinal: positive for abdominal pain, negative for nausea and diarrhea Musculoskeletal: negative for myalgias and arthralgias All other systems reviewed and are negative    Past Medical History:  Diagnosis Date  . Chronic pain   . Cirrhosis of liver (HCC)   . Gastric ulceration   . Hepatitis B   . Hepatitis C   . Polysubstance abuse   . Upper GI bleed     Social History  Substance Use Topics    . Smoking status: Current Every Day Smoker    Packs/day: 0.50    Types: Cigarettes  . Smokeless tobacco: Never Used  . Alcohol use No    Family History  Problem Relation Age of Onset  . Dementia Mother     No Known Allergies  Physical Exam: Constitutional: in no apparent distress  Vitals:   10/28/16 0543 10/28/16 1438  BP: 122/83 129/72  Pulse: 74 85  Resp: 18 17  Temp: 98.9 F (37.2 C) 98.4 F (36.9 C)   EYES: anicteric ENMT: no thrush Cardiovascular: Cor RRR Respiratory: CTA B; normal respiratory effort GI: Bowel sounds are normal Musculoskeletal: no pedal edema noted Skin: negatives: no rash Hematologic: no cervical lad  Lab Results  Component Value Date   WBC 3.6 (L) 10/28/2016   HGB 10.6 (L) 10/28/2016   HCT 32.9 (L) 10/28/2016   MCV 90.9 10/28/2016   PLT 50 (L) 10/28/2016    Lab Results  Component Value Date   CREATININE 0.78 10/28/2016   BUN 11 10/28/2016   NA 138 10/28/2016   K 4.0 10/28/2016   CL 106 10/28/2016   CO2 25 10/28/2016    Lab Results  Component Value Date   ALT 22 10/27/2016   AST 34 10/27/2016   ALKPHOS 108 10/27/2016     Microbiology: No results found for this or any previous   visit (from the past 240 hour(s)).  Staci RighterOMER, [name], MD [facility] [email or web address] [phone] pager  [phone] cell 10/28/2016, 2:43 PM

## 2016-10-30 NOTE — Op Note (Signed)
AV normal  No AI MV normal  Mild MR TV normal  Mild TR PV normal  No PI LA appendage without masses No definite PFO as tested with injection of agitated saline with abdominal pressure Normal LV function   Thoracic aorta is normal

## 2016-10-30 NOTE — Anesthesia Postprocedure Evaluation (Signed)
Anesthesia Post Note  Patient: Dorise Bullionony W Brokaw  Procedure(s) Performed: Procedure(s) (LRB): TRANSESOPHAGEAL ECHOCARDIOGRAM (TEE) (N/A)  Patient location during evaluation: Endoscopy Anesthesia Type: MAC Level of consciousness: awake and alert Pain management: pain level controlled Vital Signs Assessment: post-procedure vital signs reviewed and stable Respiratory status: spontaneous breathing, nonlabored ventilation, respiratory function stable and patient connected to nasal cannula oxygen Cardiovascular status: stable and blood pressure returned to baseline Anesthetic complications: no    Last Vitals:  Vitals:   10/30/16 1410 10/30/16 1420  BP: (!) 93/34 116/74  Pulse: (!) 44 82  Resp: 14 14  Temp: 36.7 C     Last Pain:  Vitals:   10/30/16 1657  TempSrc:   PainSc: 10-Worst pain ever                 Phillips Groutarignan, Cristofer Yaffe

## 2016-10-30 NOTE — H&P (View-Only) (Signed)
PROGRESS NOTE    Dan [name]  ZOX:[id number] DOB: August 13, 1971 DOA: 10/27/2016 PCP: [name], NP     Brief Narrative:  Patient is a 45 y.o.malehistory of IVDA (crushes opana), history of hep B/C, cirrhosis requiring frequent paracentesis, recent history of upper GI bleeding-recent numerous admissions, last admission he was found to have fungemia-he unfortunately left the hospital AGAINST MEDICAL ADVICE on 10/26 before completing antifungal therapy. He was admitted on 11/14, for evaluation of abdominal pain.  Assessment & Plan:   Principal Problem:   Ascites Active Problems:   Other cirrhosis of liver (HCC)   Chronic hepatitis B (HCC)   Chronic hepatitis C without hepatic coma (HCC)   Thrombocytopenia (HCC)  Recent Candida fungemia(positive blood culture on 10/04/16):  -Continue fluconazole, have consulted infectious disease-patient has history of active IVDA. Recent transthoracic echocardiogram on 10/25 negative for vegetations. Reviewed prior records, initially thought to have fungemia from SBP-however with patient acknowledging IVDA-feel that this probably is the likely source of infection. Infectious disease recommends. TEE scheduled for today. Paracentesis canceled as there was limited fluid for procedure. Continue fluconazole and ceftriaxone for now. Will likely need ophthalmology evaluation at some point-perhaps can be done in the outpatient setting. Blood cultures pending.  Abdominal pain:Abdomen is soft-he does appear to have ascites but it is not tense. Pain is mostly chronic. Paracentesis canceled due to limited fluid for procedure. Continue Rocephin for now.   Cirrhosis:Relatively well compensated, continue Lasix and Aldactone, lactulose. Follow.  Chronic pain syndrome: Has mostly chronic abdominal pain-unfortunately IVDA with crushed opana-he is at risk of having withdrawal symptoms-continue with as needed narcotics for now. Maybenefit from initiation into a  methadone program as an outpatient. Have asked case management to see if we can get this patient connected to a local methadone clinic near his home  Thrombocytopenia:Stable-likely chronic and secondary to liver cirrhosis  Chronic hep B and hep C:Needs to demonstrate compliance to medications/follow-up and abstinence from drug use-before even considering outpatient referral to the hepatology clinic.  Recent upper GI bleed due to a large gastric ulcer (August 2017): Repeat endoscopy on 10/26 negative for ulcer. Continue PPI.  Active history of IVDA:Acknowledges that he injects crushed opana-counseled extensively. Maybenefit from initiation into a methadone program as an outpatient. See above.    DVT prophylaxis: SCDs Code Status: Full Family Communication: no family at bedside Disposition Plan: TEE today    Consultants:   ID  Cardiology for TEE   Procedures:   TEE 11/17  Antimicrobials:   Diflucan >>  Rocephin >>    Subjective: Patient complaining of abdominal pain that is chronic. No new complaints; denies any fevers, chills, chest pain, SOB, nausea or vomiting.   Objective: Vitals:   10/29/16 0559 10/29/16 1517 10/29/16 2111 10/30/16 0559  BP: 114/70 103/71 113/69 107/61  Pulse: (!) 44 76 83 70  Resp: 18 18 18 18   Temp: 97.9 F (36.6 C) 98.4 F (36.9 C) 98.7 F (37.1 C) 97.9 F (36.6 C)  TempSrc: Oral Oral Oral Oral  SpO2: 96% 100% 95% 95%  Weight:      Height:        Intake/Output Summary (Last 24 hours) at 10/30/16 1000 Last data filed at 10/30/16 0651  Gross per 24 hour  Intake             1890 ml  Output                0 ml  Net  1890 ml   Filed Weights   10/27/16 1918 10/28/16 0149  Weight: 68.9 kg (152 lb) 69.5 kg (153 lb 4.8 oz)    Examination:  General exam: Appears calm and comfortable  Respiratory system: Clear to auscultation. Respiratory effort normal. Cardiovascular system: S1 & S2 heard, RRR. No JVD, murmurs, rubs,  gallops or clicks. No pedal edema. Gastrointestinal system: Abdomen is nondistended, soft and nontender. +periumbilical hernia. No organomegaly or masses felt. Normal bowel sounds heard. Central nervous system: Alert and oriented. No focal neurological deficits. Extremities: Symmetric 5 x 5 power. Skin: No rashes, lesions or ulcers Psychiatry: Judgement and insight appear normal. Mood & affect appropriate.   Data Reviewed: I have personally reviewed following labs and imaging studies  CBC:  Recent Labs Lab 10/27/16 2010 10/28/16 0530 10/30/16 0612  WBC 3.8* 3.6* 4.0  HGB 12.4* 10.6* 11.3*  HCT 37.9* 32.9* 35.0*  MCV 90.2 90.9 90.7  PLT 49* 50* 54*   Basic Metabolic Panel:  Recent Labs Lab 10/27/16 2010 10/28/16 0530 10/30/16 0612  NA 139 138 135  K 3.7 4.0 4.1  CL 104 106 101  CO2 29 25 27   GLUCOSE 98 100* 117*  BUN 8 11 19   CREATININE 0.93 0.78 0.75  CALCIUM 8.3* 7.8* 8.2*   GFR: Estimated Creatinine Clearance: 112.2 mL/min (by C-G formula based on SCr of 0.75 mg/dL). Liver Function Tests:  Recent Labs Lab 10/27/16 2010  AST 34  ALT 22  ALKPHOS 108  BILITOT 0.7  PROT 6.8  ALBUMIN 2.7*    Recent Labs Lab 10/27/16 2010  LIPASE 41    Recent Labs Lab 10/27/16 2010  AMMONIA 24   Coagulation Profile:  Recent Labs Lab 10/27/16 2010 10/30/16 0612  INR 1.29 1.26   Cardiac Enzymes: No results for input(s): CKTOTAL, CKMB, CKMBINDEX, TROPONINI in the last 168 hours. BNP (last 3 results) No results for input(s): PROBNP in the last 8760 hours. HbA1C: No results for input(s): HGBA1C in the last 72 hours. CBG: No results for input(s): GLUCAP in the last 168 hours. Lipid Profile: No results for input(s): CHOL, HDL, LDLCALC, TRIG, CHOLHDL, LDLDIRECT in the last 72 hours. Thyroid Function Tests: No results for input(s): TSH, T4TOTAL, FREET4, T3FREE, THYROIDAB in the last 72 hours. Anemia Panel: No results for input(s): VITAMINB12, FOLATE, FERRITIN,  TIBC, IRON, RETICCTPCT in the last 72 hours. Sepsis Labs:  Recent Labs Lab 10/27/16 2025  LATICACIDVEN 2.31*    Recent Results (from the past 240 hour(s))  Culture, blood (routine x 2)     Status: None (Preliminary result)   Collection Time: 10/27/16  8:10 PM  Result Value Ref Range Status   Specimen Description BLOOD LEFT UPPER ARM  Final   Special Requests BOTTLES DRAWN AEROBIC AND ANAEROBIC 5CC  Final   Culture NO GROWTH 1 DAY  Final   Report Status PENDING  Incomplete  Culture, blood (routine x 2)     Status: None (Preliminary result)   Collection Time: 10/27/16  9:04 PM  Result Value Ref Range Status   Specimen Description BLOOD RIGHT ARM  Final   Special Requests BLOOD AEROBIC BOTTLE 5CC  Final   Culture NO GROWTH 2 DAYS  Final   Report Status PENDING  Incomplete       Radiology Studies: [place] Abdomen Limited  Result Date: 10/29/2016 CLINICAL DATA:  Bowel distention EXAM: LIMITED ABDOMEN ULTRASOUND FOR ASCITES TECHNIQUE: Limited ultrasound survey for ascites was performed in all four abdominal quadrants. COMPARISON:  October 07, 2016 FINDINGS:  There is minimal ascites. There are multiple loops of peristalsing bowel. Splenic enlargement again noted. IMPRESSION: Enlarged spleen.  Minimal ascites. Electronically Signed   By: Bretta BangWilliam  Woodruff III M.D.   On: 10/29/2016 16:29      Scheduled Meds: . cefTRIAXone (ROCEPHIN)  IV  2 g Intravenous Q24H  . fluconazole (DIFLUCAN) IV  400 mg Intravenous QHS  . furosemide  40 mg Oral Daily  . lactulose  20 g Oral BID  . pantoprazole  40 mg Oral BID  . spironolactone  100 mg Oral Daily  . sucralfate  1 g Oral Q6H   Continuous Infusions: . sodium chloride       LOS: 2 days    Time spent: 40 minutes   Noralee StainJennifer Shikira Folino, DO [facility] [email or web address] Password [email or web address] 10/30/2016, 10:00 AM

## 2016-10-30 NOTE — Progress Notes (Signed)
[facility]    Date of Admission:  10/27/2016   Total days of antibiotics 4        Day 4 ceftriraxone        Day 3 fluconazole          ID: Dan [name] is a 45 y.o. male with remote hx of ivdu per patient, chronic hepatitis B/C c/b cirrhosis and UGID who was recently seen for ascites and left AMA but blood culture grew candida tropicalis.  He did not take medications and now back with ascites.  Still with active drug use.  No fever, no chills.  No diarrhea, no SOB.  No chest pain.  Also with cirrhosis.    Principal Problem:   Ascites Active Problems:   Other cirrhosis of liver (HCC)   Chronic hepatitis B (HCC)   Chronic hepatitis C without hepatic coma (HCC)   Thrombocytopenia (HCC)    Subjective: Afebrile. Underwent TEE which was negative for vegetation. Unable to do paracentesis since limited ascites  Medications:  . cefTRIAXone (ROCEPHIN)  IV  2 g Intravenous QHS  . fluconazole (DIFLUCAN) IV  400 mg Intravenous QHS  . furosemide  40 mg Oral Daily  . heparin subcutaneous  5,000 Units Subcutaneous Q8H  . lactulose  20 g Oral Daily  . pantoprazole  40 mg Oral BID  . spironolactone  100 mg Oral Daily  . sucralfate  1 g Oral Q6H    Objective: Vital signs in last 24 hours: Temp:  [97.9 F (36.6 C)-98.7 F (37.1 C)] 98.1 F (36.7 C) (11/17 1410) Pulse Rate:  [44-83] 82 (11/17 1420) Resp:  [14-18] 14 (11/17 1420) BP: (93-130)/(34-80) 116/74 (11/17 1420) SpO2:  [95 %-99 %] 99 % (11/17 1420) Physical Exam  Constitutional: He is oriented to person, place, and time. He appears under-developed and frail. No distress. Appears chronically ill HENT: poor dentition Mouth/Throat: Oropharynx is clear and moist. No oropharyngeal exudate.  Cardiovascular: Normal rate, regular rhythm and normal heart sounds. Exam reveals no gallop and no friction rub.  No murmur heard.  Pulmonary/Chest: Effort normal and breath sounds normal. No respiratory distress. He  has no wheezes.  Abdominal: Soft.protuberant abdomen. Bowel sounds are normal. He exhibits no distension. There is no tenderness.  Lymphadenopathy:  He has no cervical adenopathy.  Neurological: He is alert and oriented to person, place, and time.  Skin: Skin is warm and dry. No rash noted. No erythema.  Psychiatric: He has a normal mood and affect. His behavior is normal.     Lab Results  Recent Labs  10/28/16 0530 10/30/16 0612  WBC 3.6* 4.0  HGB 10.6* 11.3*  HCT 32.9* 35.0*  NA 138 135  K 4.0 4.1  CL 106 101  CO2 25 27  BUN 11 19  CREATININE 0.78 0.75   Liver Panel  Recent Labs  10/27/16 2010  PROT 6.8  ALBUMIN 2.7*  AST 34  ALT 22  ALKPHOS 108  BILITOT 0.7   Microbiology: 11/14 blood cx ngtd 10/22 blod cx c.tropicalis Studies/Results: [place] Abdomen Limited  Result Date: 10/29/2016 CLINICAL DATA:  Bowel distention EXAM: LIMITED ABDOMEN ULTRASOUND FOR ASCITES TECHNIQUE: Limited ultrasound survey for ascites was performed in all four abdominal quadrants. COMPARISON:  October 07, 2016 FINDINGS: There is minimal ascites. There are multiple loops of peristalsing bowel. Splenic enlargement again noted. IMPRESSION: Enlarged spleen.  Minimal ascites. Electronically Signed   By: Bretta BangWilliam  Woodruff III M.D.   On: 10/29/2016 16:29  Assessment/Plan: Fungemia = possibly due to SBP from previous admission. Recommend to complete 14 days of fluconazole 400mg  daily.   SBP = currently on day 4 of ceftriaxone and can switch to oral cephalosporin when ready for discharge  Chronic hep C/hep B with cirrhosis = can follow up for management once he stops illicit drug use. [facility].  Drue SecondSNIDER, [facility] Cell: [phone] Pager: [phone]  10/30/2016, 4:46 PM

## 2016-10-30 NOTE — Progress Notes (Signed)
  Echocardiogram Echocardiogram Transesophageal has been performed.  Dan [name] 10/30/2016, 2:35 PM

## 2016-10-30 NOTE — Anesthesia Procedure Notes (Signed)
Procedure Name: MAC Date/Time: 10/30/2016 1:43 PM Performed by: Demetrio LappingSMITH, Walt Geathers P Pre-anesthesia Checklist: Patient identified, Emergency Drugs available, Suction available, Patient being monitored and Timeout performed Patient Re-evaluated:Patient Re-evaluated prior to inductionOxygen Delivery Method: Nasal cannula Intubation Type: IV induction Dental Injury: Teeth and Oropharynx as per pre-operative assessment

## 2016-10-30 NOTE — Transfer of Care (Signed)
Immediate Anesthesia Transfer of Care Note  Patient: Dan [name]  Procedure(s) Performed: Procedure(s): TRANSESOPHAGEAL ECHOCARDIOGRAM (TEE) (N/A)  Patient Location: PACU and Endoscopy Unit  Anesthesia Type:MAC  Level of Consciousness: awake, patient cooperative and lethargic  Airway & Oxygen Therapy: Patient Spontanous Breathing and Patient connected to nasal cannula oxygen  Post-op Assessment: Report given to RN and Post -op Vital signs reviewed and stable  Post vital signs: Reviewed and stable  Last Vitals:  Vitals:   10/30/16 0559 10/30/16 1236  BP: 107/61 130/80  Pulse: 70 66  Resp: 18 14  Temp: 36.6 C 36.9 C    Last Pain:  Vitals:   10/30/16 1236  TempSrc: Oral  PainSc:       Patients Stated Pain Goal: 4 (10/30/16 1052)  Complications: No apparent anesthesia complications

## 2016-10-31 DIAGNOSIS — B377 Candidal sepsis: Principal | ICD-10-CM

## 2016-10-31 DIAGNOSIS — G894 Chronic pain syndrome: Secondary | ICD-10-CM | POA: Diagnosis present

## 2016-10-31 DIAGNOSIS — F1910 Other psychoactive substance abuse, uncomplicated: Secondary | ICD-10-CM | POA: Diagnosis present

## 2016-10-31 LAB — CBC WITH DIFFERENTIAL/PLATELET
BASOS ABS: 0 10*3/uL (ref 0.0–0.1)
BASOS PCT: 1 %
EOS ABS: 0.1 10*3/uL (ref 0.0–0.7)
Eosinophils Relative: 1 %
HCT: 34.5 % — ABNORMAL LOW (ref 39.0–52.0)
HEMOGLOBIN: 11.2 g/dL — AB (ref 13.0–17.0)
Lymphocytes Relative: 37 %
Lymphs Abs: 1.5 10*3/uL (ref 0.7–4.0)
MCH: 29.5 pg (ref 26.0–34.0)
MCHC: 32.5 g/dL (ref 30.0–36.0)
MCV: 90.8 fL (ref 78.0–100.0)
Monocytes Absolute: 0.3 10*3/uL (ref 0.1–1.0)
Monocytes Relative: 8 %
NEUTROS ABS: 2.1 10*3/uL (ref 1.7–7.7)
NEUTROS PCT: 53 %
Platelets: 58 10*3/uL — ABNORMAL LOW (ref 150–400)
RBC: 3.8 MIL/uL — AB (ref 4.22–5.81)
RDW: 15.1 % (ref 11.5–15.5)
WBC: 4 10*3/uL (ref 4.0–10.5)

## 2016-10-31 LAB — BASIC METABOLIC PANEL
ANION GAP: 7 (ref 5–15)
BUN: 19 mg/dL (ref 6–20)
CHLORIDE: 102 mmol/L (ref 101–111)
CO2: 27 mmol/L (ref 22–32)
CREATININE: 0.86 mg/dL (ref 0.61–1.24)
Calcium: 8.2 mg/dL — ABNORMAL LOW (ref 8.9–10.3)
GFR calc non Af Amer: >60 mL/min (ref >60)
Glucose, Bld: 122 mg/dL — ABNORMAL HIGH (ref 65–99)
POTASSIUM: 4.4 mmol/L (ref 3.5–5.1)
SODIUM: 136 mmol/L (ref 135–145)

## 2016-10-31 MED ORDER — CEPHALEXIN 500 MG PO CAPS
500.0000 mg | ORAL_CAPSULE | Freq: Two times a day (BID) | ORAL | Status: DC
  Administered 2016-10-31: 500 mg via ORAL
  Filled 2016-10-31: qty 1

## 2016-10-31 MED ORDER — CEPHALEXIN 500 MG PO CAPS
500.0000 mg | ORAL_CAPSULE | Freq: Two times a day (BID) | ORAL | 0 refills | Status: AC
Start: 2016-10-31 — End: 2016-11-09

## 2016-10-31 MED ORDER — FLUCONAZOLE 200 MG PO TABS: 400.0000 mg | ORAL_TABLET | Freq: Every day | ORAL | 0 refills | Status: AC

## 2016-10-31 MED ORDER — FLUCONAZOLE 100 MG PO TABS
400.0000 mg | ORAL_TABLET | Freq: Every day | ORAL | Status: DC
  Administered 2016-10-31: 400 mg via ORAL
  Filled 2016-10-31: qty 4

## 2016-10-31 NOTE — Progress Notes (Signed)
PT Cancellation Note  Patient Details Name: Nuala Alphaony W Baeten MRN: [id number] DOB: 17-Apr-1971   Cancelled Treatment:    Reason Eval/Treat Not Completed: PT screened, no needs identified, will sign off. Per nursing pt has been ambulating in hallway without difficulty.   Angelina OkCary W Maycok 10/31/2016, 3:00 PM [employer] CorporationCary Valynn Schamberger PT [phone]

## 2016-10-31 NOTE — Progress Notes (Signed)
NURSING PROGRESS NOTE  Dan [name] [id number] Discharge Data: 10/31/2016 11:23 AM Attending Provider: [name] OZH:YQMV,HQIONGPCP:YORK,[name], NP     Dan [name] to be D/C'd Home per MD order.  Discussed with the patient the After Visit Summary and all questions fully answered. All IV's discontinued with no bleeding noted. All belongings returned to patient for patient to take home.   Last Vital Signs:  Blood pressure 109/67, pulse 80, temperature 97.9 F (36.6 C), temperature source Oral, resp. rate 18, height 5\' 11"  (1.803 m), weight 68.9 kg (151 lb 14.4 oz), SpO2 95 %.  Discharge Medication List   Medication List    STOP taking these medications   oxyCODONE 5 MG immediate release tablet Commonly known as:  ROXICODONE     TAKE these medications   cephALEXin 500 MG capsule Commonly known as:  KEFLEX Take 1 capsule (500 mg total) by mouth every 12 (twelve) hours.   fluconazole 200 MG tablet Commonly known as:  DIFLUCAN Take 2 tablets (400 mg total) by mouth daily. Start taking on:  11/01/2016   furosemide 40 MG tablet Commonly known as:  LASIX Take 1 tablet (40 mg total) by mouth daily.   lactulose 10 GM/15ML solution Commonly known as:  CHRONULAC Take 30 mLs (20 g total) by mouth daily.   ondansetron 4 MG tablet Commonly known as:  ZOFRAN Take 1 tablet (4 mg total) by mouth every 8 (eight) hours as needed for nausea or vomiting.   pantoprazole 40 MG tablet Commonly known as:  PROTONIX Take 1 tablet (40 mg total) by mouth 2 (two) times daily.   spironolactone 100 MG tablet Commonly known as:  ALDACTONE Take 1 tablet (100 mg total) by mouth daily.   sucralfate 1 GM/10ML suspension Commonly known as:  CARAFATE Take 10 mLs (1 g total) by mouth every 6 (six) hours.        [name], RN

## 2016-10-31 NOTE — Clinical Social Work Note (Signed)
Per RN, patient in need of a way home. Patient lives in [place], [place]. [place] met with patient who stated he has tried calling 5-6 people. His fiance is unable to pick him up because her car is broken down. Patient reports he is unable to contribute any funds to a cab. CSW spoke with Surveyor, quantity of social work. He would like patient to continue trying to find a ride. If patient has not found a ride by 3:00 pm, a cab voucher will be provided. RN updated.  Dayton Scrape, [place]

## 2016-10-31 NOTE — Discharge Instructions (Signed)
Recommendations for Outpatient Follow-up:  1. Follow up with PCP in 1-2 weeks 2. Finish your antifungal and antibiotics as prescribed  3. Will need outpatient ophthalmology evaluation due to recent Candida fungemia 4. Follow-up with GI outpatient as previously recommended. He needs to demonstrate compliance to medications and follow-up and abstinence from drug use prior to outpatient referral to hepatology clinic 5. Patient counseled multiple times to stop all illicit drug abuse 6. Please follow up on the following pending results: final blood cultures. It is negative thus far on day of discharge.

## 2016-10-31 NOTE — Discharge Summary (Signed)
Physician Discharge Summary  Dan [name] ZOX:[id number] DOB: [birth year] DOA: 10/27/2016  PCP: Dema SeverinYORK,[name], NP  Admit date: 10/27/2016 Discharge date: 10/31/2016  Admitted From: Home Disposition:  Home  Recommendations for Outpatient Follow-up:  1. Follow up with PCP in 1-2 weeks 2. Finish your antifungal and antibiotics as prescribed  3. Will need outpatient ophthalmology evaluation due to recent Candida fungemia 4. Follow-up with GI outpatient as previously recommended. He needs to demonstrate compliance to medications and follow-up and abstinence from drug use prior to outpatient referral to hepatology clinic 5. Patient counseled multiple times to stop all illicit drug abuse 6. Please follow up on the following pending results: final blood cultures. It is negative thus far on day of discharge.   Home Health: No  Equipment/Devices: None   Discharge Condition: Stable CODE STATUS: Full  Diet recommendation: general   Brief/Interim Summary: From H&P: Dan [name] is a 45 y.o. male with medical history significant of IVDA (crushes opana), liver cirrhosis, hepatitis C, hepatitis B, polysubstance abuse, hernia, GI bleeding, tobacco abuse, GERD, and chronic thrombocytopenia, who reports "my liver is messed up and my stomach is swollen like a basketball and I'm in misery, I'm hurting so bad I can't stand it."  [facility] has been hurting since but worse since Sunday. Patient was previously admitted from 10/13-19 for ascites without SBP.  He had paracentesis twice and was started on Aldactone and Lasix and told to f/u with GI for ascites as well as chronic Hep B and C.  He did not fill those prescriptions. He returned with recurrent symptoms on 10/22 and was admitted from 10/23-26 who left AMA on 10/26.  Blood cultures were positive for Candida tropicalis and ID was consulted and recommended Rocephin 2g IV daily for SBP for 7 days and then weekly SBP prophylaxis  and IV fluconazole for 14 days via PICC line.  Interim: During admission, infectious disease was consulted. He was treated with IV Rocephin as well as IV Diflucan for previous candida fungemia as well as possible SBP. He was evaluated by ultrasound for paracentesis to rule out SBP. The paracentesis was canceled due to limited fluid for procedure. He underwent a TEE on 11/17 which was negative for vegetation. He will be discharged home with oral Keflex and oral Diflucan for total 14 day course. Patient needs outpatient ophthalmology evaluation due to recent candidemia. Repeat blood cultures this admission have been negative thus far. He should follow-up for final results.  Subjective on day of discharge: Feeling well, complains of chronic abdominal pain. Requesting IV morphine. We discussed that patient will not be discharged with narcotics at time of discharge. He may take over-the-counter ibuprofen for intermittent pain. Needs to follow up with PCP as well as possible referral to local methadone/pain clinic. Otherwise, doing well, denies any chest pain or shortness of breath. Afebrile.  Discharge Diagnoses:  Principal Problem:   Candidemia [facility](HCC) Active Problems:   Other cirrhosis of liver (HCC)   Abdominal pain   Chronic hepatitis B (HCC)   Chronic hepatitis C without hepatic coma (HCC)   Thrombocytopenia (HCC)   Chronic pain syndrome   IV drug abuse   Recent Candida fungemia(positive blood culture on 10/04/16): Recent transthoracic echocardiogram on 10/25 negative for vegetations. Reviewed prior records, initially thought to have fungemia from SBP-however with patient acknowledging IVDA-feel that this probably is the likely source of infection. TEE 11/17 negative for vegetation. Paracentesis canceled as there was limited fluid for  procedure. Will continue fluconazole 400mg  daily for 14 days and keflex. Will likely need ophthalmology evaluation at some point-perhaps can be done in the  outpatient setting. Blood cultures negative to date.   Abdominal pain:Abdomen is soft-he does appear to have ascites but it is not tense. Pain is mostly chronic. Paracentesis canceled due to limited fluid for procedure.   Cirrhosis:Relatively well compensated, continue Lasix and Aldactone, lactulose. Follow.  Chronic hep B and hep C:Needs to demonstrate compliance to medications/follow-up and abstinence from drug use-before even considering outpatient referral to the hepatology clinic.  Chronic pain syndrome: Has mostly chronic abdominal pain-unfortunately IVDA with crushed opana-he is at risk of having withdrawal symptoms-continue with as needed narcotics for now. Maybenefit from initiation into a methadone program as an outpatient. Have asked case management to see if we can get this patient connected to a local methadone clinic near his home  Thrombocytopenia:Stable-likely chronic and secondary to liver cirrhosis  Recent upper GI bleed due to a large gastric ulcer (August 2017): Repeat endoscopy on 10/26 negative for ulcer. Continue PPI.  Active history of IVDA:Acknowledges that he injects crushed opana-counseled extensively. Maybenefit from initiation into a methadone program as an outpatient. See above.    Discharge Instructions  Discharge Instructions    Call MD for:  temperature >100.4    Complete by:  As directed    Diet - low sodium heart healthy    Complete by:  As directed    Discharge instructions    Complete by:  As directed    Recommendations for Outpatient Follow-up:  Follow up with PCP in 1-2 weeks Finish your antifungal and antibiotics as prescribed  Will need outpatient ophthalmology evaluation due to recent Candida fungemia Follow-up with GI outpatient as previously recommended. He needs to demonstrate compliance to medications and follow-up and abstinence from drug use prior to outpatient referral to hepatology clinic Patient counseled multiple times  to stop all illicit drug abuse Please follow up on the following pending results: final blood cultures. It is negative thus far on day of discharge.   Increase activity slowly    Complete by:  As directed        Medication List    STOP taking these medications   oxyCODONE 5 MG immediate release tablet Commonly known as:  ROXICODONE     TAKE these medications   cephALEXin 500 MG capsule Commonly known as:  KEFLEX Take 1 capsule (500 mg total) by mouth every 12 (twelve) hours.   fluconazole 200 MG tablet Commonly known as:  DIFLUCAN Take 2 tablets (400 mg total) by mouth daily. Start taking on:  11/01/2016   furosemide 40 MG tablet Commonly known as:  LASIX Take 1 tablet (40 mg total) by mouth daily.   lactulose 10 GM/15ML solution Commonly known as:  CHRONULAC Take 30 mLs (20 g total) by mouth daily.   ondansetron 4 MG tablet Commonly known as:  ZOFRAN Take 1 tablet (4 mg total) by mouth every 8 (eight) hours as needed for nausea or vomiting.   pantoprazole 40 MG tablet Commonly known as:  PROTONIX Take 1 tablet (40 mg total) by mouth 2 (two) times daily.   spironolactone 100 MG tablet Commonly known as:  ALDACTONE Take 1 tablet (100 mg total) by mouth daily.   sucralfate 1 GM/10ML suspension Commonly known as:  CARAFATE Take 10 mLs (1 g total) by mouth every 6 (six) hours.      Follow-up Information    [name], NP. Schedule an  appointment as soon as possible for a visit in 1 week(s).   Contact information: [address] [place] [id number] [phone]          No Known Allergies  Consultations:  ID  Cardiology for TEE 11/17 negative for vegetations    Procedures/Studies: Ct Abdomen Pelvis W Contrast  Result Date: 10/05/2016 CLINICAL DATA:  Abdominal pain and swelling, vomiting blood recent paracentesis EXAM: CT ABDOMEN AND PELVIS WITH CONTRAST TECHNIQUE: Multidetector CT imaging of the abdomen and pelvis was performed using the standard  protocol following bolus administration of intravenous contrast. CONTRAST:  100mL ISOVUE-300 IOPAMIDOL (ISOVUE-300) INJECTION 61% COMPARISON:  09/30/2016, 07/14/2016 FINDINGS: Lower chest: Patchy dependent atelectasis. Nodular atelectasis or scarring in the posterior right lung base. No effusion. Heart size normal. No large pericardial effusion. Hepatobiliary: Nodular contour of the liver consistent with cirrhosis. Heterogenous liver enhancement but without discrete mass. Portal veins and splenic vein appear patent. The gallbladder is dilated but decreased compared to prior. No intra or extrahepatic biliary dilatation. Pancreas: Unremarkable. No pancreatic ductal dilatation or surrounding inflammatory changes. Spleen: Markedly enlarged at 20 cm. Adrenals/Urinary Tract: Adrenal glands are within normal limits. Small calcified stone in the mid to upper right kidney. No hydronephrosis. Bladder is normal. Stomach/Bowel: The stomach is collapsed. There is distal esophageal thickening. No dilated large or small bowel. Probable esophageal varices. Vascular/Lymphatic: Aortic caliber is normal. There is atherosclerosis present. No grossly enlarged lymph nodes. Reproductive: Prostate gland slightly enlarged with few calcifications. Other: No free air. Small to moderate ascites, decreased compared to comparison exam. Musculoskeletal: No acute osseous abnormality. Degenerative changes of the spine with gas within the disc space and posterior to the L4 vertebral body. IMPRESSION: 1. Nodular liver contour or consistent with cirrhosis. Findings consistent with portal hypertension including marked splenomegaly and probable esophageal varices. 2. Small to moderate volume of ascites, decreased compared to prior. 3. Distal esophageal thickening could relate to esophagitis. 4. Dilated gallbladder but decreased compared to prior. No intra or extrahepatic biliary dilatation. 5. Nonobstructing stone in the right kidney. Electronically  Signed   By: Jasmine PangKim  Fujinaga M.D.   On: 10/05/2016 01:51   [place] Abdomen Limited  Result Date: 10/29/2016 CLINICAL DATA:  Bowel distention EXAM: LIMITED ABDOMEN ULTRASOUND FOR ASCITES TECHNIQUE: Limited ultrasound survey for ascites was performed in all four abdominal quadrants. COMPARISON:  October 07, 2016 FINDINGS: There is minimal ascites. There are multiple loops of peristalsing bowel. Splenic enlargement again noted. IMPRESSION: Enlarged spleen.  Minimal ascites. Electronically Signed   By: Bretta BangWilliam  Woodruff III M.D.   On: 10/29/2016 16:29   [place] Paracentesis  Result Date: 10/07/2016 INDICATION: Ascites secondary to alcoholic cirrhosis. Chronic hepatitis B and C. Request for therapeutic paracentesis. EXAM: ULTRASOUND GUIDED RIGHT LATERAL ABDOMEN PARACENTESIS MEDICATIONS: 1% Lidocaine. COMPLICATIONS: None immediate. PROCEDURE: Informed written consent was obtained from the patient after a discussion of the risks, benefits and alternatives to treatment. A timeout was performed prior to the initiation of the procedure. Initial ultrasound scanning demonstrates a moderate amount of ascites within the right lateral abdomen. The right lateral abdomen was prepped and draped in the usual sterile fashion. 1% lidocaine with epinephrine was used for local anesthesia. Following this, a 6 Fr Safe-T-Centesis catheter was introduced. An ultrasound image was saved for documentation purposes. The paracentesis was performed. The catheter was removed and a dressing was applied. The patient tolerated the procedure well without immediate post procedural complication. FINDINGS: A total of approximately 1.45 liters of clear yellow fluid was removed. Samples  were sent to the laboratory as requested by the clinical team. IMPRESSION: Successful ultrasound-guided paracentesis yielding 1.45 liters of peritoneal fluid. Read by:  Corrin Parker, PA-C Electronically Signed   By: Jolaine Click M.D.   On: 10/07/2016 13:26   Dg Chest Port 1  View  Result Date: 10/04/2016 CLINICAL DATA:  Sepsis. EXAM: PORTABLE CHEST 1 VIEW COMPARISON:  09/25/2016 FINDINGS: Cardiomediastinal silhouette is within normal limits. Diffuse interstitial coarsening is unchanged and compatible with chronic lung disease. There is no evidence of acute airspace consolidation, overt edema, pleural effusion, or pneumothorax. No acute osseous abnormality is seen. IMPRESSION: No active disease. Electronically Signed   By: Sebastian Ache M.D.   On: 10/04/2016 22:25     Discharge Exam: Vitals:   10/30/16 2242 10/31/16 0456  BP: 101/65 109/67  Pulse: 82 80  Resp: 18 18  Temp: 98 F (36.7 C) 97.9 F (36.6 C)   Vitals:   10/30/16 1420 10/30/16 2242 10/31/16 0456 10/31/16 0512  BP: 116/74 101/65 109/67   Pulse: 82 82 80   Resp: 14 18 18    Temp:  98 F (36.7 C) 97.9 F (36.6 C)   TempSrc:  Oral Oral   SpO2: 99% 100% 95%   Weight:    68.9 kg (151 lb 14.4 oz)  Height:    5\' 11"  (1.803 m)    General: Pt is alert, awake, not in acute distress Cardiovascular: RRR, S1/S2 +, no rubs, no gallops Respiratory: CTA bilaterally, no wheezing, no rhonchi Abdominal: Soft, TTP LUQ, ND, bowel sounds + Extremities: no edema, no cyanosis    The results of significant diagnostics from this hospitalization (including imaging, microbiology, ancillary and laboratory) are listed below for reference.     Microbiology: Recent Results (from the past 240 hour(s))  Culture, blood (routine x 2)     Status: None (Preliminary result)   Collection Time: 10/27/16  8:10 PM  Result Value Ref Range Status   Specimen Description BLOOD LEFT UPPER ARM  Final   Special Requests BOTTLES DRAWN AEROBIC AND ANAEROBIC 5CC  Final   Culture NO GROWTH 3 DAYS  Final   Report Status PENDING  Incomplete  Culture, blood (routine x 2)     Status: None (Preliminary result)   Collection Time: 10/27/16  9:04 PM  Result Value Ref Range Status   Specimen Description BLOOD RIGHT ARM  Final   Special  Requests BLOOD AEROBIC BOTTLE 5CC  Final   Culture NO GROWTH 4 DAYS  Final   Report Status PENDING  Incomplete     Labs: BNP (last 3 results)  Recent Labs  07/14/16 0640  BNP 144.3*   Basic Metabolic Panel:  Recent Labs Lab 10/27/16 2010 10/28/16 0530 10/30/16 0612 10/31/16 0532  NA 139 138 135 136  K 3.7 4.0 4.1 4.4  CL 104 106 101 102  CO2 29 25 27 27   GLUCOSE 98 100* 117* 122*  BUN 8 11 19 19   CREATININE 0.93 0.78 0.75 0.86  CALCIUM 8.3* 7.8* 8.2* 8.2*   Liver Function Tests:  Recent Labs Lab 10/27/16 2010  AST 34  ALT 22  ALKPHOS 108  BILITOT 0.7  PROT 6.8  ALBUMIN 2.7*    Recent Labs Lab 10/27/16 2010  LIPASE 41    Recent Labs Lab 10/27/16 2010  AMMONIA 24   CBC:  Recent Labs Lab 10/27/16 2010 10/28/16 0530 10/30/16 0612 10/31/16 0532  WBC 3.8* 3.6* 4.0 4.0  NEUTROABS  --   --   --  2.1  HGB 12.4* 10.6* 11.3* 11.2*  HCT 37.9* 32.9* 35.0* 34.5*  MCV 90.2 90.9 90.7 90.8  PLT 49* 50* 54* 58*   Cardiac Enzymes: No results for input(s): CKTOTAL, CKMB, CKMBINDEX, TROPONINI in the last 168 hours. BNP: Invalid input(s): POCBNP CBG: No results for input(s): GLUCAP in the last 168 hours. D-Dimer No results for input(s): DDIMER in the last 72 hours. Hgb A1c No results for input(s): HGBA1C in the last 72 hours. Lipid Profile No results for input(s): CHOL, HDL, LDLCALC, TRIG, CHOLHDL, LDLDIRECT in the last 72 hours. Thyroid function studies No results for input(s): TSH, T4TOTAL, T3FREE, THYROIDAB in the last 72 hours.  Invalid input(s): FREET3 Anemia work up No results for input(s): VITAMINB12, FOLATE, FERRITIN, TIBC, IRON, RETICCTPCT in the last 72 hours. Urinalysis    Component Value Date/Time   COLORURINE AMBER (A) 10/28/2016 1200   APPEARANCEUR CLEAR 10/28/2016 1200   LABSPEC 1.022 10/28/2016 1200   PHURINE 6.0 10/28/2016 1200   GLUCOSEU NEGATIVE 10/28/2016 1200   HGBUR NEGATIVE 10/28/2016 1200   BILIRUBINUR NEGATIVE  10/28/2016 1200   KETONESUR NEGATIVE 10/28/2016 1200   PROTEINUR NEGATIVE 10/28/2016 1200   NITRITE NEGATIVE 10/28/2016 1200   LEUKOCYTESUR NEGATIVE 10/28/2016 1200   Sepsis Labs Invalid input(s): PROCALCITONIN,  WBC,  LACTICIDVEN Microbiology Recent Results (from the past 240 hour(s))  Culture, blood (routine x 2)     Status: None (Preliminary result)   Collection Time: 10/27/16  8:10 PM  Result Value Ref Range Status   Specimen Description BLOOD LEFT UPPER ARM  Final   Special Requests BOTTLES DRAWN AEROBIC AND ANAEROBIC 5CC  Final   Culture NO GROWTH 3 DAYS  Final   Report Status PENDING  Incomplete  Culture, blood (routine x 2)     Status: None (Preliminary result)   Collection Time: 10/27/16  9:04 PM  Result Value Ref Range Status   Specimen Description BLOOD RIGHT ARM  Final   Special Requests BLOOD AEROBIC BOTTLE 5CC  Final   Culture NO GROWTH 4 DAYS  Final   Report Status PENDING  Incomplete     Time coordinating discharge: Over 30 minutes  SIGNED:  Noralee Stain, DO [facility] Pager [phone]  If 7PM-7AM, please contact night-coverage [email or web address] Password [email or web address] 10/31/2016, 10:58 AM

## 2016-11-01 LAB — CULTURE, BLOOD (ROUTINE X 2): CULTURE: NO GROWTH

## 2016-11-02 ENCOUNTER — Encounter (HOSPITAL_COMMUNITY): Payer: Self-pay | Admitting: Internal Medicine

## 2016-11-02 LAB — CULTURE, BLOOD (ROUTINE X 2): Culture: NO GROWTH

## 2017-12-09 IMAGING — DX DG CHEST 1V PORT
1 series · 1 of 1 positions shown · non-contrast
Comparison: 09/11/2016

CLINICAL DATA: Dyspnea, cough, abdominal pain

EXAM:
PORTABLE CHEST 1 VIEW

[chest ap]
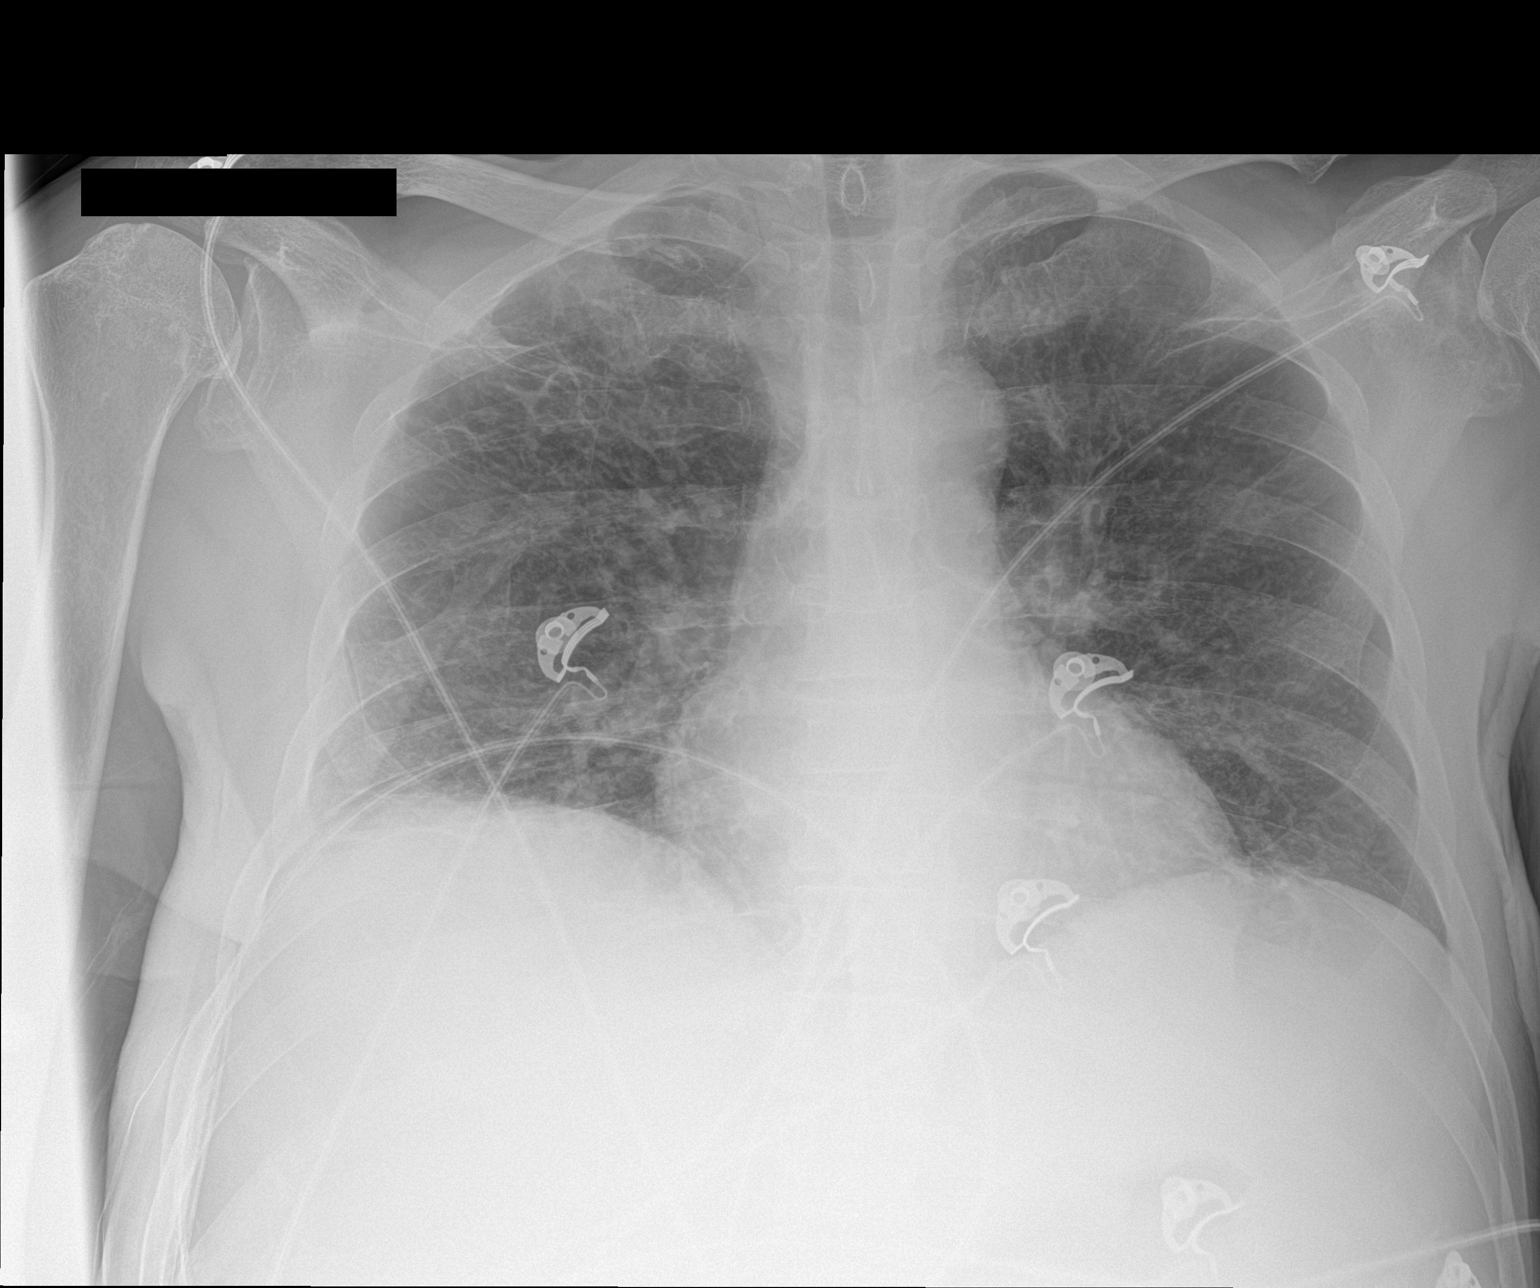

[1 of 1 positions shown; findings below may reference images not displayed]

FINDINGS: There are low lung volumes. There is bilateral diffuse interstitial
thickening likely chronic. There is no focal parenchymal opacity.
There is no pleural effusion or pneumothorax. The heart and
mediastinal contours are unremarkable.

The osseous structures are unremarkable.
IMPRESSION: No active cardiopulmonary disease.

## 2017-12-11 IMAGING — CR DG ABD PORTABLE 1V
1 series · 1 of 1 positions shown · non-contrast
Comparison: None.

CLINICAL DATA: Abdominal pain.  Paracentesis today

EXAM:
PORTABLE ABDOMEN - 1 VIEW

[AP]
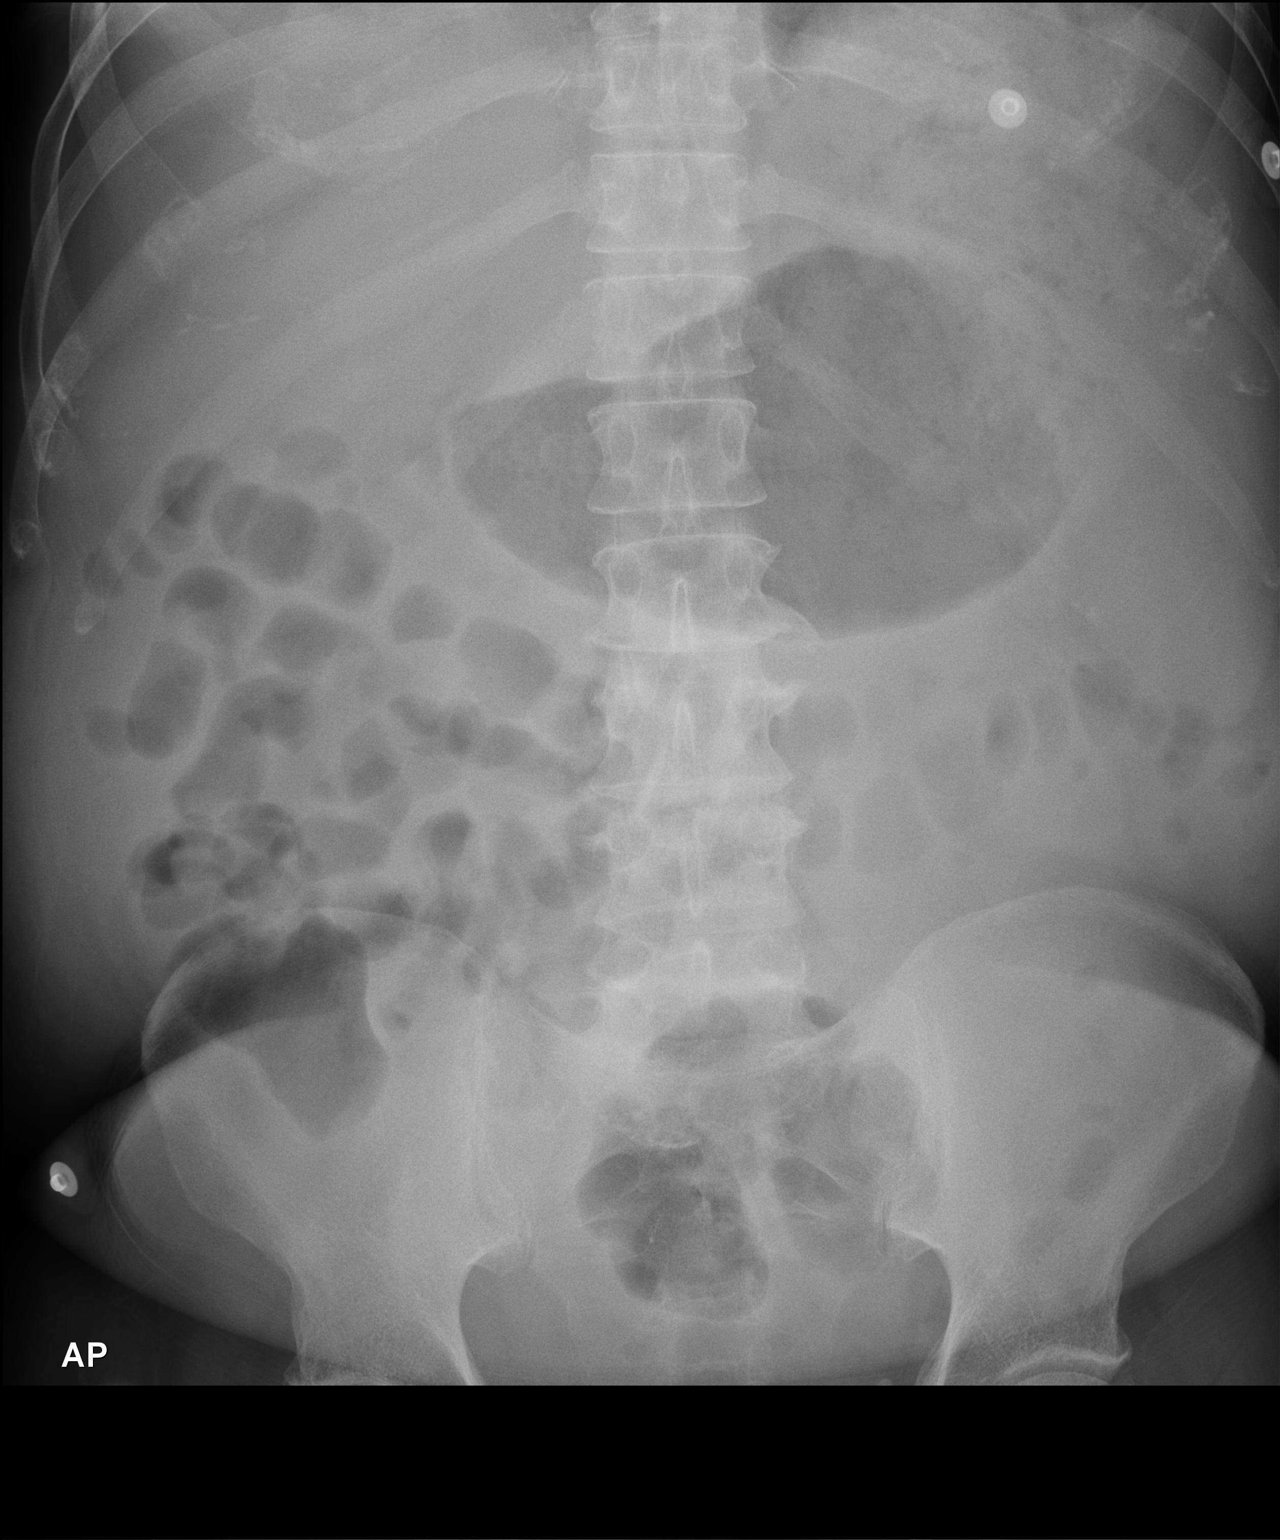

[1 of 1 positions shown; findings below may reference images not displayed]

FINDINGS: The bowel gas pattern is normal. No radio-opaque calculi or other
significant radiographic abnormality are seen.
IMPRESSION: Negative.

These results will be called to the ordering clinician or
representative by the Radiologist Assistant, and communication
documented in the PACS or zVision Dashboard.

## 2018-05-10 ENCOUNTER — Emergency Department (HOSPITAL_COMMUNITY)
Admission: EM | Admit: 2018-05-10 | Discharge: 2018-05-11 | Disposition: A | Discharge status: Home / Self Care | Payer: Medicaid Other | Attending: Emergency Medicine | Admitting: Emergency Medicine

## 2018-05-10 ENCOUNTER — Encounter (HOSPITAL_COMMUNITY): Payer: Self-pay

## 2018-05-10 DIAGNOSIS — W109XXD Fall (on) (from) unspecified stairs and steps, subsequent encounter: Secondary | ICD-10-CM | POA: Diagnosis not present

## 2018-05-10 DIAGNOSIS — Z79899 Other long term (current) drug therapy: Secondary | ICD-10-CM | POA: Insufficient documentation

## 2018-05-10 DIAGNOSIS — F17210 Nicotine dependence, cigarettes, uncomplicated: Secondary | ICD-10-CM | POA: Insufficient documentation

## 2018-05-10 DIAGNOSIS — N390 Urinary tract infection, site not specified: Secondary | ICD-10-CM

## 2018-05-10 DIAGNOSIS — S32301D Unspecified fracture of right ilium, subsequent encounter for fracture with routine healing: Secondary | ICD-10-CM

## 2018-05-10 NOTE — ED Notes (Signed)
Bed: WTR8 Expected date:  Expected time:  Means of arrival:  Comments: 

## 2018-05-10 NOTE — ED Notes (Signed)
Pt called out and stated that he has urinated blood

## 2018-05-10 NOTE — ED Triage Notes (Signed)
Pt also has bed bugs

## 2018-05-10 NOTE — ED Triage Notes (Signed)
Pt fell yesterday about 5 steps and was seen at [facility], they told him he had a broken hip but nothing they could do about it Pt states the pain is severe

## 2018-05-11 ENCOUNTER — Emergency Department (HOSPITAL_COMMUNITY): Payer: Medicaid Other

## 2018-05-11 ENCOUNTER — Encounter (HOSPITAL_COMMUNITY): Payer: Self-pay | Admitting: Radiology

## 2018-05-11 LAB — I-STAT CHEM 8, ED
BUN: 19 mg/dL (ref 6–20)
CALCIUM ION: 1.15 mmol/L (ref 1.15–1.40)
CREATININE: 0.6 mg/dL — AB (ref 0.61–1.24)
Chloride: 98 mmol/L — ABNORMAL LOW (ref 101–111)
GLUCOSE: 187 mg/dL — AB (ref 65–99)
HCT: 34 % — ABNORMAL LOW (ref 39.0–52.0)
HEMOGLOBIN: 11.6 g/dL — AB (ref 13.0–17.0)
Potassium: 4.3 mmol/L (ref 3.5–5.1)
Sodium: 134 mmol/L — ABNORMAL LOW (ref 135–145)
TCO2: 27 mmol/L (ref 22–32)

## 2018-05-11 LAB — CBC WITH DIFFERENTIAL/PLATELET
BASOS PCT: 0 %
Basophils Absolute: 0 10*3/uL (ref 0.0–0.1)
EOS ABS: 0 10*3/uL (ref 0.0–0.7)
Eosinophils Relative: 1 %
HCT: 35.2 % — ABNORMAL LOW (ref 39.0–52.0)
HEMOGLOBIN: 11.6 g/dL — AB (ref 13.0–17.0)
Lymphocytes Relative: 10 %
Lymphs Abs: 0.8 10*3/uL (ref 0.7–4.0)
MCH: 28.8 pg (ref 26.0–34.0)
MCHC: 33 g/dL (ref 30.0–36.0)
MCV: 87.3 fL (ref 78.0–100.0)
Monocytes Absolute: 0.4 10*3/uL (ref 0.1–1.0)
Monocytes Relative: 5 %
NEUTROS PCT: 84 %
Neutro Abs: 7 10*3/uL (ref 1.7–7.7)
PLATELETS: 102 10*3/uL — AB (ref 150–400)
RBC: 4.03 MIL/uL — AB (ref 4.22–5.81)
RDW: 14.3 % (ref 11.5–15.5)
WBC: 8.2 10*3/uL (ref 4.0–10.5)

## 2018-05-11 LAB — URINALYSIS, ROUTINE W REFLEX MICROSCOPIC
BILIRUBIN URINE: NEGATIVE
Glucose, UA: NEGATIVE mg/dL
Hgb urine dipstick: NEGATIVE
Ketones, ur: NEGATIVE mg/dL
LEUKOCYTES UA: NEGATIVE
Nitrite: POSITIVE — AB
PROTEIN: NEGATIVE mg/dL
SPECIFIC GRAVITY, URINE: 1.02 (ref 1.005–1.030)
pH: 5 (ref 5.0–8.0)

## 2018-05-11 MED ORDER — CEPHALEXIN 500 MG PO CAPS: 500.0000 mg | ORAL_CAPSULE | Freq: Three times a day (TID) | ORAL | 0 refills | Status: DC

## 2018-05-11 MED ORDER — SODIUM CHLORIDE 0.9 % IV BOLUS
1000.0000 mL | Freq: Once | INTRAVENOUS | Status: AC
  Administered 2018-05-11: 1000 mL via INTRAVENOUS

## 2018-05-11 MED ORDER — HYDROMORPHONE HCL 1 MG/ML IJ SOLN
1.0000 mg | Freq: Once | INTRAMUSCULAR | Status: AC
  Administered 2018-05-11: 1 mg via INTRAVENOUS
  Filled 2018-05-11: qty 1

## 2018-05-11 MED ORDER — CEPHALEXIN 500 MG PO CAPS
500.0000 mg | ORAL_CAPSULE | Freq: Once | ORAL | Status: AC
  Administered 2018-05-11: 500 mg via ORAL
  Filled 2018-05-11: qty 1

## 2018-05-11 MED ORDER — OXYCODONE-ACETAMINOPHEN 5-325 MG PO TABS
1.0000 | ORAL_TABLET | Freq: Once | ORAL | Status: AC
  Administered 2018-05-11: 1 via ORAL
  Filled 2018-05-11: qty 1

## 2018-05-11 MED ORDER — MORPHINE SULFATE (PF) 4 MG/ML IV SOLN
4.0000 mg | Freq: Once | INTRAVENOUS | Status: AC
  Administered 2018-05-11: 4 mg via INTRAVENOUS
  Filled 2018-05-11: qty 1

## 2018-05-11 MED ORDER — OXYCODONE-ACETAMINOPHEN 5-325 MG PO TABS: 1.0000 | ORAL_TABLET | Freq: Four times a day (QID) | ORAL | 0 refills | Status: DC | PRN

## 2018-05-11 NOTE — Progress Notes (Signed)
Received call from the ER regarding pelvis fracture.  Patient seen at [facility], and then has been seen at [facility] long.  CAT scan reviewed.  Recommended touch toe weightbearing, crutches, pain control, follow-up as an outpatient with me in 1 to 2 weeks.  Teryl Lucy, MD.

## 2018-05-11 NOTE — ED Provider Notes (Signed)
[name]-EMERGENCY DEPT Provider Note   CSN: [id number] Arrival date & time: 05/10/18  2337     History   Chief Complaint Chief Complaint  Patient presents with  . Fall    HPI [name] is a 47 y.o. male.  HPI   47 year old male with history of chronic pain syndrome, IV drug abuse, hepatitis presenting for evaluation of hip pain.  Patient report 3 days ago, he was walking down the steps, tripped on the step, fell down approximately 5 steps and injuring his right hip.  He mention been evaluated at [facility] for his fall.  States that he was diagnosed with having a broken hip but "nothing can be done about it" and was sent home.  Since then, patient mention significant pain to his hip, worse difficulty moving about.  States he has been mostly bedbound.  Pain is currently 10 out of 10, sharp, throbbing, nonradiating.  He also endorsed seeing blood in his urine and painful urinating for the same duration.  States he has history of kidney stones in the past but denies any flank pain at this time.  No fever chills lightheadedness of dizziness or abdominal pain.  He has been taking Tylenol at home with minimal relief.     Past Medical History:  Diagnosis Date  . Chronic pain   . Cirrhosis of liver (HCC)   . Gastric ulceration   . Hepatitis B   . Hepatitis C   . Polysubstance abuse (HCC)   . Upper GI bleed     Patient Active Problem List   Diagnosis Date Noted  . Chronic pain syndrome 10/31/2016  . IV drug abuse (HCC) 10/31/2016  . Thrombocytopenia (HCC) 10/28/2016  . Candidemia (HCC)   . Chronic gastric ulcer with hemorrhage   . Decompensated HCV cirrhosis (HCC)   . Chronic giant gastric ulcer   . Chronic hepatitis B (HCC)   . Chronic hepatitis C without hepatic coma (HCC)   . Other cirrhosis of liver (HCC) 07/14/2016  . Abdominal pain 07/14/2016    Past Surgical History:  Procedure Laterality Date  . ESOPHAGOGASTRODUODENOSCOPY N/A  07/16/2016   Procedure: ESOPHAGOGASTRODUODENOSCOPY (EGD);  Surgeon: Ruffin Frederick, MD;  Location: [facility] ENDOSCOPY;  Service: Gastroenterology;  Laterality: N/A;  . ESOPHAGOGASTRODUODENOSCOPY N/A 10/08/2016   Procedure: ESOPHAGOGASTRODUODENOSCOPY (EGD);  Surgeon: Sherrilyn Rist, MD;  Location: [facility] ENDOSCOPY;  Service: Endoscopy;  Laterality: N/A;  . LUNG SURGERY     right lobectomy  . TEE WITHOUT CARDIOVERSION N/A 10/30/2016   Procedure: TRANSESOPHAGEAL ECHOCARDIOGRAM (TEE);  Surgeon: Pricilla Riffle, MD;  Location: [facility] ENDOSCOPY;  Service: Cardiovascular;  Laterality: N/A;        Home Medications    Prior to Admission medications   Medication Sig Start Date End Date Taking? Authorizing Provider  furosemide (LASIX) 40 MG tablet Take 1 tablet (40 mg total) by mouth daily. 10/01/16   Ghimire, Werner Lean, MD  lactulose (CHRONULAC) 10 GM/15ML solution Take 30 mLs (20 g total) by mouth daily. 10/01/16   Ghimire, Werner Lean, MD  ondansetron (ZOFRAN) 4 MG tablet Take 1 tablet (4 mg total) by mouth every 8 (eight) hours as needed for nausea or vomiting. 10/01/16   Ghimire, Werner Lean, MD  pantoprazole (PROTONIX) 40 MG tablet Take 1 tablet (40 mg total) by mouth 2 (two) times daily. 10/01/16   Ghimire, Werner Lean, MD  spironolactone (ALDACTONE) 100 MG tablet Take 1 tablet (100 mg total) by mouth daily.  10/01/16   Ghimire, Werner Lean, MD  sucralfate (CARAFATE) 1 GM/10ML suspension Take 10 mLs (1 g total) by mouth every 6 (six) hours. 10/01/16   GhimireWerner Lean, MD    Family History Family History  Problem Relation Age of Onset  . Dementia Mother     Social History Social History   Tobacco Use  . Smoking status: Current Every Day Smoker    Packs/day: 0.50    Types: Cigarettes  . Smokeless tobacco: Never Used  Substance Use Topics  . Alcohol use: No  . Drug use: No    Comment: Pt admits to hx of IV drug use however denies any recent drug use.     Allergies   Patient has no known  allergies.   Review of Systems Review of Systems  All other systems reviewed and are negative.    Physical Exam Updated Vital Signs BP 125/81 (BP Location: Right Arm)   Pulse 81   Temp 98 F (36.7 C) (Oral)   Resp 16   Ht  (1.803 m)   Wt 63.5 kg (140 lb)   SpO2 97%   BMI 19.53 kg/m   Physical Exam  Constitutional: He is oriented to person, place, and time. He appears well-developed and well-nourished. No distress.  HENT:  Head: Atraumatic.  Poor dentition  Eyes: Conjunctivae are normal.  Neck: Normal range of motion. Neck supple.  Genitourinary:  Genitourinary Comments: No CVA tenderness  Musculoskeletal: He exhibits tenderness (Right hip: Tenderness to lateral hip on palpation with decreased hip flexion extension abduction abduction secondary to pain.  No gross deformity, no bruising noted.).  No significant midline spine tenderness crepitus or step-off.  Neurological: He is alert and oriented to person, place, and time.  Skin: No rash noted.  Psychiatric: He has a normal mood and affect.  Nursing note and vitals reviewed.    ED Treatments / Results  Labs (all labs ordered are listed, but only abnormal results are displayed) Labs Reviewed  URINALYSIS, ROUTINE W REFLEX MICROSCOPIC - Abnormal; Notable for the following components:      Result Value   Color, Urine AMBER (*)    Nitrite POSITIVE (*)    Bacteria, UA MANY (*)    All other components within normal limits  CBC WITH DIFFERENTIAL/PLATELET - Abnormal; Notable for the following components:   RBC 4.03 (*)    Hemoglobin 11.6 (*)    HCT 35.2 (*)    Platelets 102 (*)    All other components within normal limits  I-STAT CHEM 8, ED - Abnormal; Notable for the following components:   Sodium 134 (*)    Chloride 98 (*)    Creatinine, Ser 0.60 (*)    Glucose, Bld 187 (*)    Hemoglobin 11.6 (*)    HCT 34.0 (*)    All other components within normal limits  URINE CULTURE    EKG None  Radiology Ct  Hip Right Wo Contrast  Result Date: 05/11/2018 CLINICAL DATA:  Fall on 05/09/2018 with resulting right iliac bone fracture. EXAM: CT OF THE RIGHT HIP WITHOUT CONTRAST TECHNIQUE: Multidetector CT imaging of the right hip was performed according to the standard protocol. Multiplanar CT image reconstructions were also generated. COMPARISON:  05/09/2018 CT scan FINDINGS: Bones/Joint/Cartilage The inferior margin of the right iliac bone fracture is partially included on today's CT of the hip. Most of the fracture is more cephalad in the bony pelvis. No appreciable fracture the right proximal femur, acetabulum, ischium, or pubic  bones. Ligaments Suboptimally assessed by CT. Muscles and Tendons Abnormal thickening and hypodensity of the right iliacus muscle likely from hematoma. Poor definition of fat planes at the sciatic notch. Mild abnormal thickening of the right piriformis muscle is suspected. No direct impingement on the sciatic nerve is observed. There is potentially mild thickening of the right operator internus muscle. Soft tissues There is suspicion for mild right external iliac and obturator adenopathy, with a suspected right obturator lymph node measuring 1.2 cm in short axis on image 33/3. Stable mild edema in the perirectal space. IMPRESSION: 1. We partially reimage the fracture of the right iliac bone which tracks along the sacroiliac joint. Today's exam was focused on the hip and did not include the upper margin of the bony pelvis or the contralateral hemipelvis. There is abnormal expansion of the iliacus, obturator internus, and piriformis musculature favoring muscle hematoma/injury. Strictly speaking I cannot exclude septic SI joint arthritis on the right side, although the patient's history seems more in keeping with a posttraumatic etiology. 2. There is edema in the right sciatic notch without obvious direct impingement on the sciatic nerve. 3. There is mild adenopathy in the right pelvis including  along the external iliac and obturator chains. I am unsure if this is reactive to the patient's cirrhosis or due to neoplastic causes. Electronically Signed   By: Gaylyn Rong M.D.   On: 05/11/2018 09:28    Procedures Procedures (including critical care time)  Medications Ordered in ED Medications  sodium chloride 0.9 % bolus 1,000 mL (1,000 mLs Intravenous New Bag/Given 05/11/18 1225)  oxyCODONE-acetaminophen (PERCOCET/ROXICET) 5-325 MG per tablet 1 tablet (1 tablet Oral Given 05/11/18 0838)  cephALEXin (KEFLEX) capsule 500 mg (500 mg Oral Given 05/11/18 0942)  morphine 4 MG/ML injection 4 mg (4 mg Intravenous Given 05/11/18 1112)  HYDROmorphone (DILAUDID) injection 1 mg (1 mg Intravenous Given 05/11/18 1225)     Initial Impression / Assessment and Plan / ED Course  I have reviewed the triage vital signs and the nursing notes.  Pertinent labs & imaging results that were available during my care of the patient were reviewed by me and considered in my medical decision making (see chart for details).     BP 126/83   Pulse 68   Temp 98 F (36.7 C) (Oral)   Resp 18   Ht  (1.803 m)   Wt 63.5 kg (140 lb)   SpO2 99%   BMI 19.53 kg/m    Final Clinical Impressions(s) / ED Diagnoses   Final diagnoses:  Closed nondisplaced fracture of right ilium with routine healing, unspecified fracture morphology, subsequent encounter  Urinary tract infection without hematuria, site unspecified    ED Discharge Orders        Ordered    cephALEXin (KEFLEX) 500 MG capsule  3 times daily     05/11/18 1537    oxyCODONE-acetaminophen (PERCOCET) 5-325 MG tablet  Every 6 hours PRN     05/11/18 1537      Patient report injury to his right hip from a fall 3 days ago.  Mentioned that he had been evaluated at an outside hospital and was told that he has a broken hip.  He mentioned he received no specific treatment, including no pain medication or orthopedic follow-up.  He mentioned he mostly  bedbound secondary to the pain.  I cannot find any corresponding record of his recent ER visit.  I will obtain CT scan of his hip for further evaluation.  Pain  medication given.  Patient endorsed pain with urination and blood in his urine.  Also mention history of kidney stone.  Urinalysis was obtained with evidence suggestive of urinary tract infection.  Urine culture sent, will treat.  No CVA tenderness at this time therefore I have low suspicion for obstructive kidney stone.  9:47 AM CT of the right hip demonstrate a fracture involving the right iliac bone which tracks along the sacroiliac joint.  There is also abnormal expansion of the iliacus, obturator internus, and piriformis musculature favoring muscle hematoma versus injury.  Due to history of IV drug use, patient having his hip pain and injury, as well as urine shows no skin tract infection, I am concerned for potential systemic involvement.  Plan to obtain basic labs, and may consider more advanced imaging for further evaluation of his condition.  1:03 PM Normal WBC.  Electrolytes panels are reassuring.  Mild anemia with hemoglobin of 11.6.  As mentioned earlier, urine with nitrite positive and many bacteria.  Urine culture sent, patient given antibiotic for urinary tract infection.  Since patient has evidence of fractures of the iliac bone as well as some abnormal expansion of the surrounding musculature, will consult orthopedic for further recommendation.  CT scan from 2 days ago of his pelvis demonstrate acute nondisplaced fracture of the right posterior ilium adjacent to the superior aspect of the right sacroiliac joint.  Small adjacent right iliac is intramural hematoma.  1:13 PM Appreciate consultation from oncall orthopedic specialist DR. Landau who will review pt's CT and offer recommendation.  Pt currently receiving pain medication.    3:26 PM Dr. Dion Saucier have reviewed the CT scan and felt that the injury is a stable fracture and pt  can bear weight (touch toe) with crutches.  Pt can f/u outpt as needed.  I discussed this with pt.  Pt d/c home with crutches, pain medication as well as doxycycline for UTI.  Return precaution discussed.    Fayrene Helper, PA-C 05/12/18 [id number]    Bethann Berkshire, MD 05/12/18 0800

## 2018-05-11 NOTE — Discharge Instructions (Signed)
You have a small break in your right pelvic bone.  Take pain medication as needed.  Use crutches to help move around. Take antibiotic for urinary tract infection.

## 2018-05-11 NOTE — ED Notes (Signed)
Bed: [facility] Expected date:  Expected time:  Means of arrival:  Comments: 

## 2018-05-13 LAB — URINE CULTURE

## 2018-05-14 ENCOUNTER — Telehealth: Payer: Self-pay

## 2018-05-14 NOTE — Telephone Encounter (Signed)
Post ED Visit - Positive Culture Follow-up: Unsuccessful Patient Follow-up  Culture assessed and recommendations reviewed by:  []  Enzo BiNathan Batchelder, Pharm.D. []  Celedonio MiyamotoJeremy Frens, Pharm.D., BCPS AQ-ID [x]  Garvin FilaMike Maccia, Pharm.D., BCPS []  Georgina PillionElizabeth Martin, [address] BCPS []  MahnomenMinh Pham, [address]Ph[address]D., BCPS, AAHIVP []  Estella HuskMichelle Turner, Pharm.D., BCPS, AAHIVP []  Sherlynn CarbonAustin Lucas, PharmD []  Pollyann SamplesAndy Johnston, PharmD, BCPS  Positive urine culture  []  Patient discharged without antimicrobial prescription and treatment is now indicated [x]  Organism is resistant to prescribed ED discharge antimicrobial []  Patient with positive blood cultures   Unable to contact patient after 3 attempts, letter will be sent to address on file  Jerry CarasCullom, [name] 05/14/2018, 10:24 AM

## 2018-05-14 NOTE — Progress Notes (Signed)
ED Antimicrobial Stewardship Positive Culture Follow Up   [name] is an 47 y.o. male who [facility] with a chief complaint of  Chief Complaint  Patient presents with  . Fall    Recent Results (from the past 720 hour(s))  Urine Culture     Status: Abnormal   Collection Time: 05/11/18  6:29 AM  Result Value Ref Range Status   Specimen Description   Final    URINE, CLEAN CATCH Performed at [facility], [address] [place], [place] [address]    Special Requests   Final    NONE Performed at [facility], [address] [place], [place] [address]    Culture >=100,000 COLONIES/mL SERRATIA MARCESCENS (A)  Final   Report Status 05/13/2018 FINAL  Final   Organism ID, Bacteria SERRATIA MARCESCENS (A)  Final      Susceptibility   Serratia marcescens - MIC*    CEFAZOLIN >=64 RESISTANT Resistant     CEFTRIAXONE <=1 SENSITIVE Sensitive     CIPROFLOXACIN <=0.25 SENSITIVE Sensitive     GENTAMICIN <=1 SENSITIVE Sensitive     NITROFURANTOIN 128 RESISTANT Resistant     TRIMETH/SULFA <=20 SENSITIVE Sensitive     * >=100,000 COLONIES/mL SERRATIA MARCESCENS    [x]  Treated with cephalexin, organism resistant to prescribed antimicrobial []  Patient discharged originally without antimicrobial agent and treatment is now indicated  New antibiotic prescription: bactrim ds bid x 1 week  ED Provider: Kathrine HaddockSam Petrucelli, PA-C  Isaac BlissMichael Marcine Gadway, PharmD, BCPS, BCCCP Clinical Pharmacist Clinical phone for 05/14/2018 from 0830 - 2100: [phone]: [phone] If after 2100, please call main pharmacy at: [phone] 05/14/2018 8:59 AM

## 2018-09-15 IMAGING — US US PARACENTESIS
1 series · 7 of 7 positions shown · non-contrast
Comparison: none

INDICATION: Ascites secondary to alcoholic cirrhosis. Chronic hepatitis B and C.
Request for therapeutic paracentesis.

[Series 1: us paracentesis · 0.28mm/px · 7 of 7 slices shown]
[im 1/7]
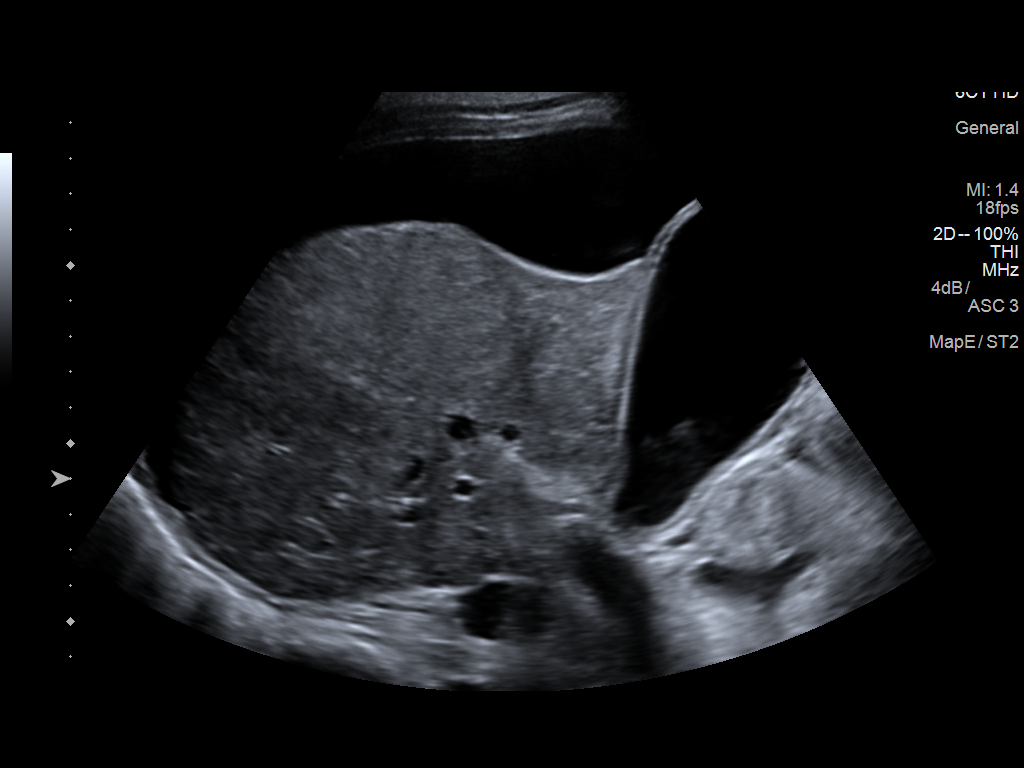
[im 2/7]
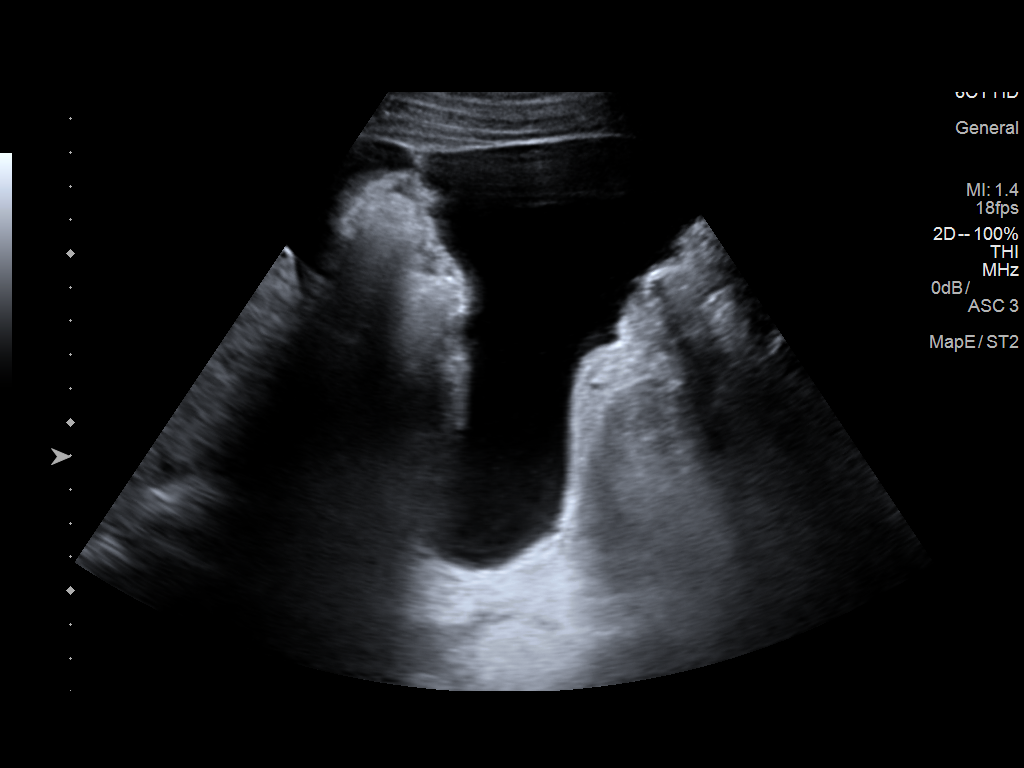
[im 3/7]
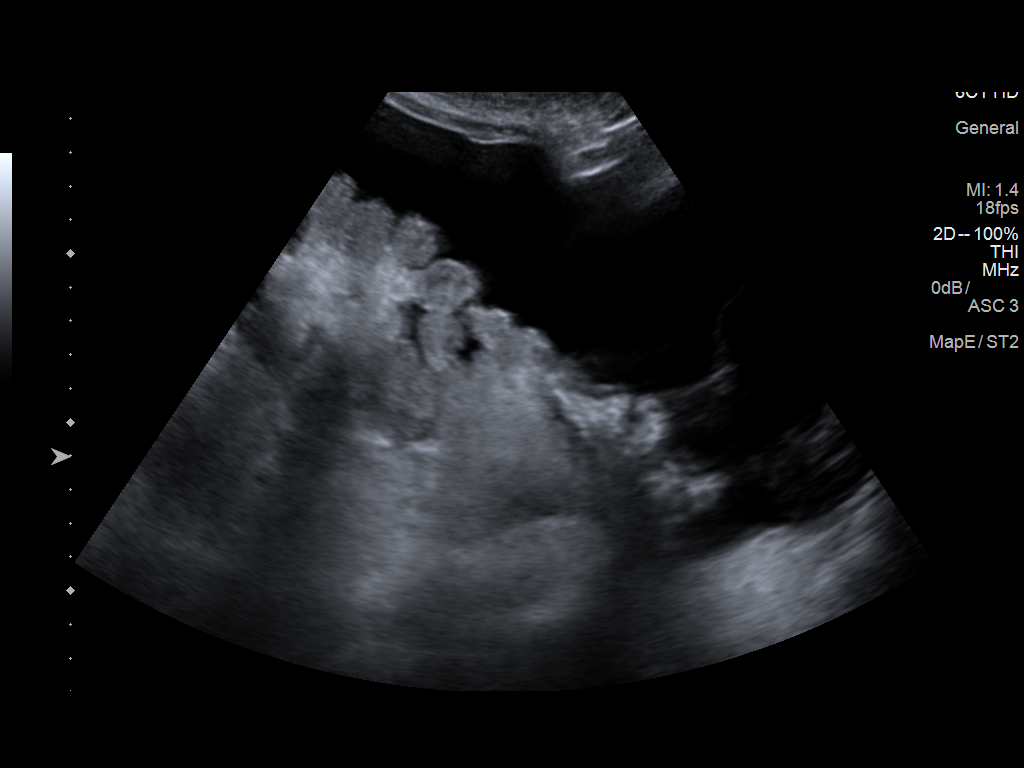
[im 4/7]
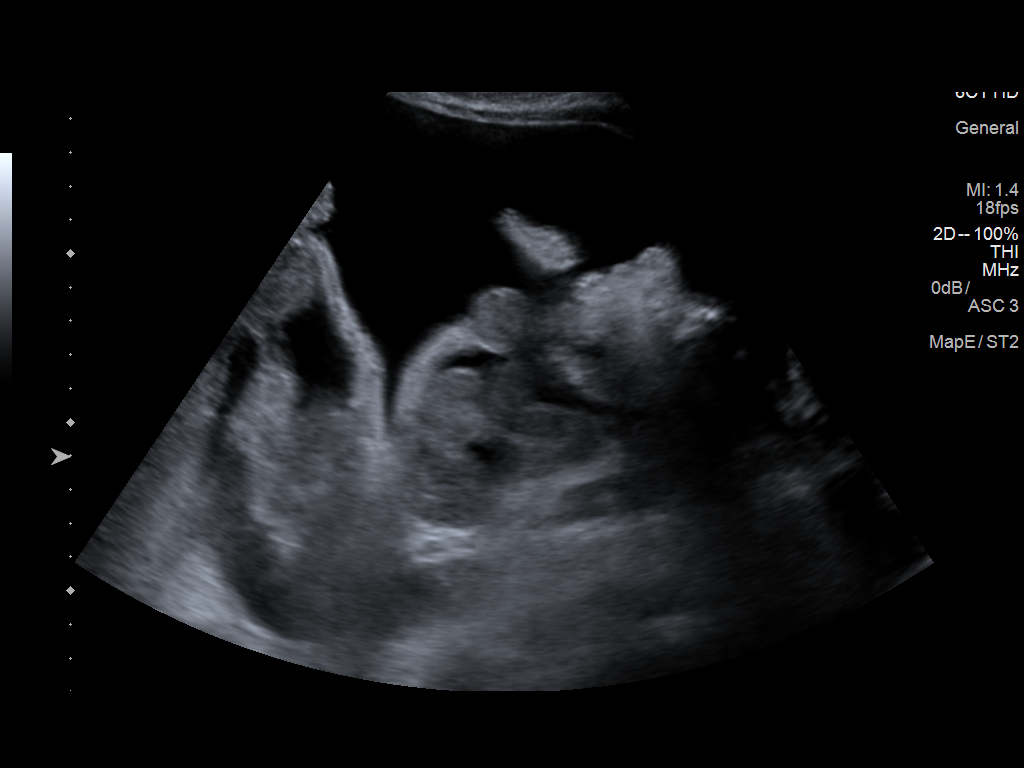
[im 5/7]
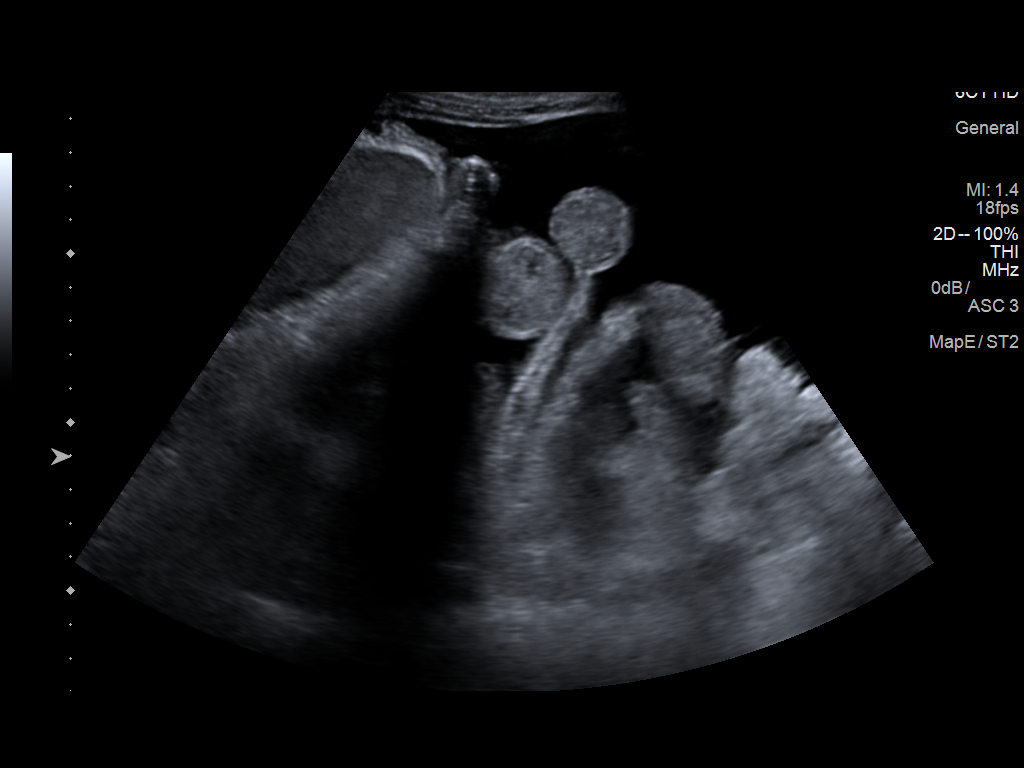
[im 6/7]
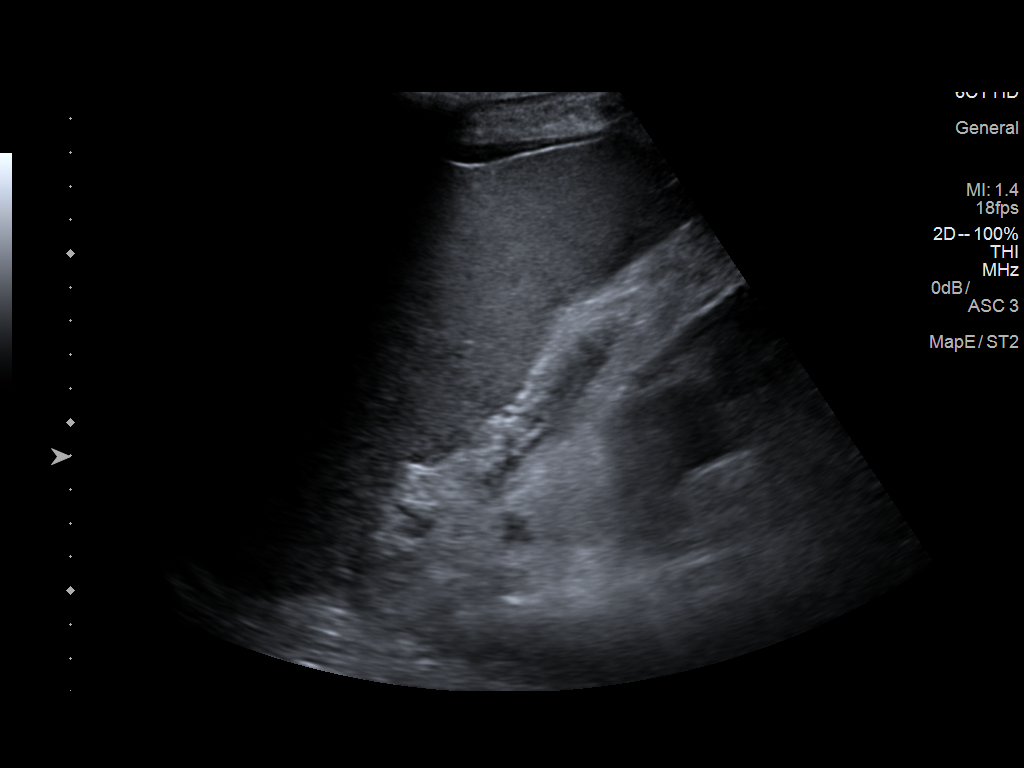
[im 7/7]
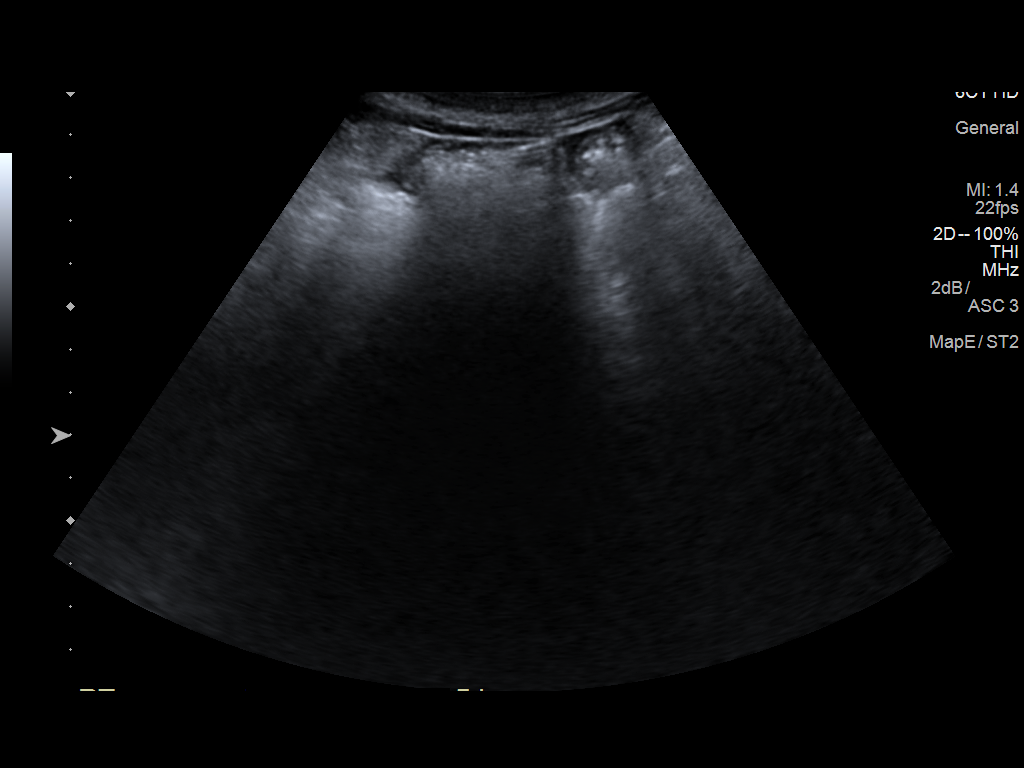

[7 of 7 positions shown; findings below may reference images not displayed]

EXAM:
ULTRASOUND GUIDED RIGHT LATERAL ABDOMEN PARACENTESIS

MEDICATIONS:
1% Lidocaine.

COMPLICATIONS:
None immediate.

PROCEDURE:
Informed written consent was obtained from the patient after a
discussion of the risks, benefits and alternatives to treatment. A
timeout was performed prior to the initiation of the procedure.

Initial ultrasound scanning demonstrates a large amount of ascites
within the right lateral abdomen. The right lateral abdomen was
prepped and draped in the usual sterile fashion. 1% lidocaine with
epinephrine was used for local anesthesia.

Following this, a 19 gauge, 7-cm, Yueh catheter was introduced. An
ultrasound image was saved for documentation purposes. The
paracentesis was performed. The catheter was removed and a dressing
was applied. The patient tolerated the procedure well without
immediate post procedural complication.
FINDINGS: A total of approximately 5 liters of clear yellow fluid was removed.
IMPRESSION: Successful ultrasound-guided paracentesis yielding 5 liters of
peritoneal fluid.

## 2018-09-18 IMAGING — US US PARACENTESIS
1 series · 5 of 5 positions shown · non-contrast
Comparison: none

INDICATION: Cirrhosis, hepatitis B/C, recurrent ascites. Request made for
diagnostic and therapeutic paracentesis.

[Series 1: us paracentesis · 0.23mm/px · 5 of 5 slices shown]
[im 1/5]
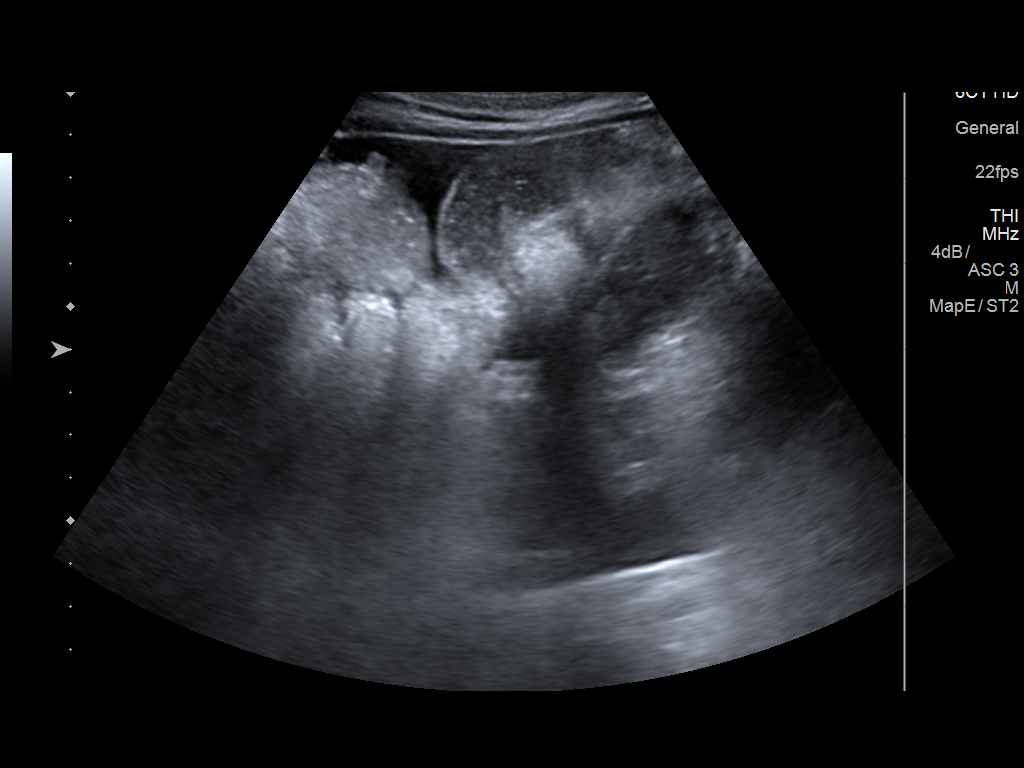
[im 2/5]
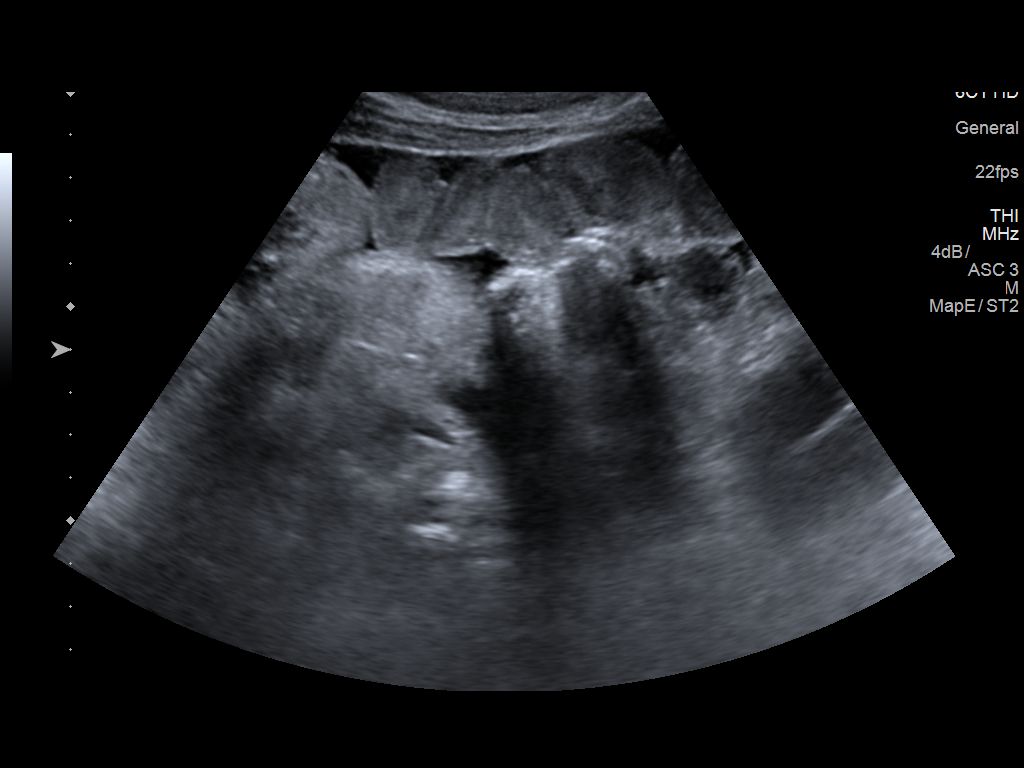
[im 3/5]
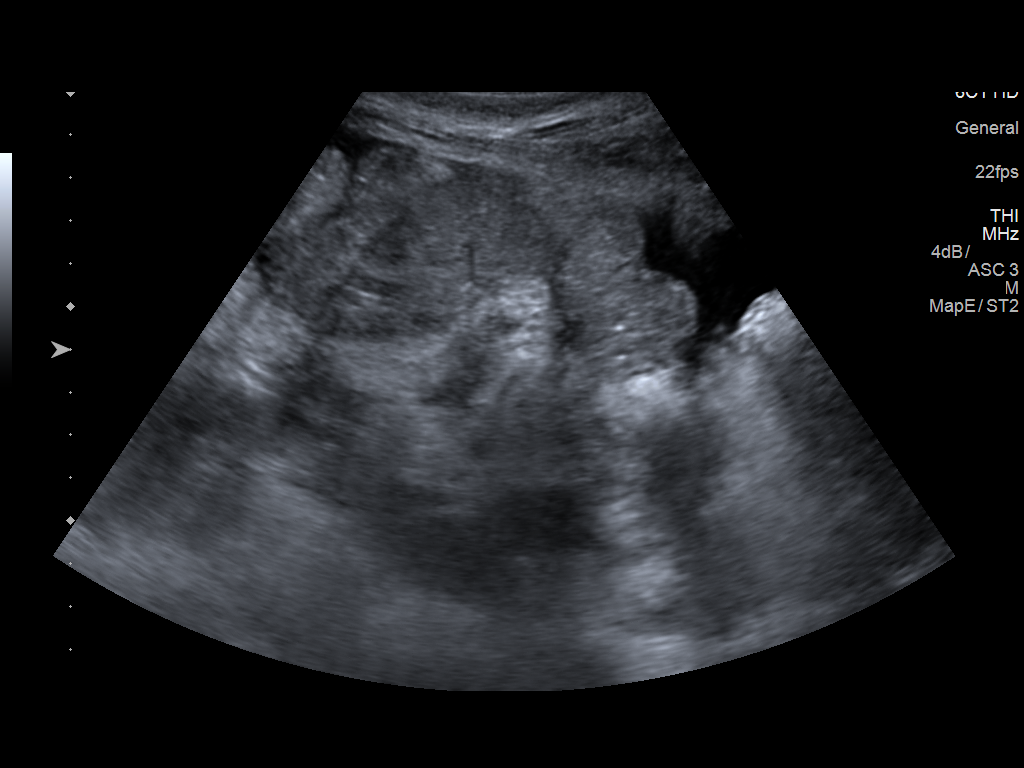
[im 4/5]
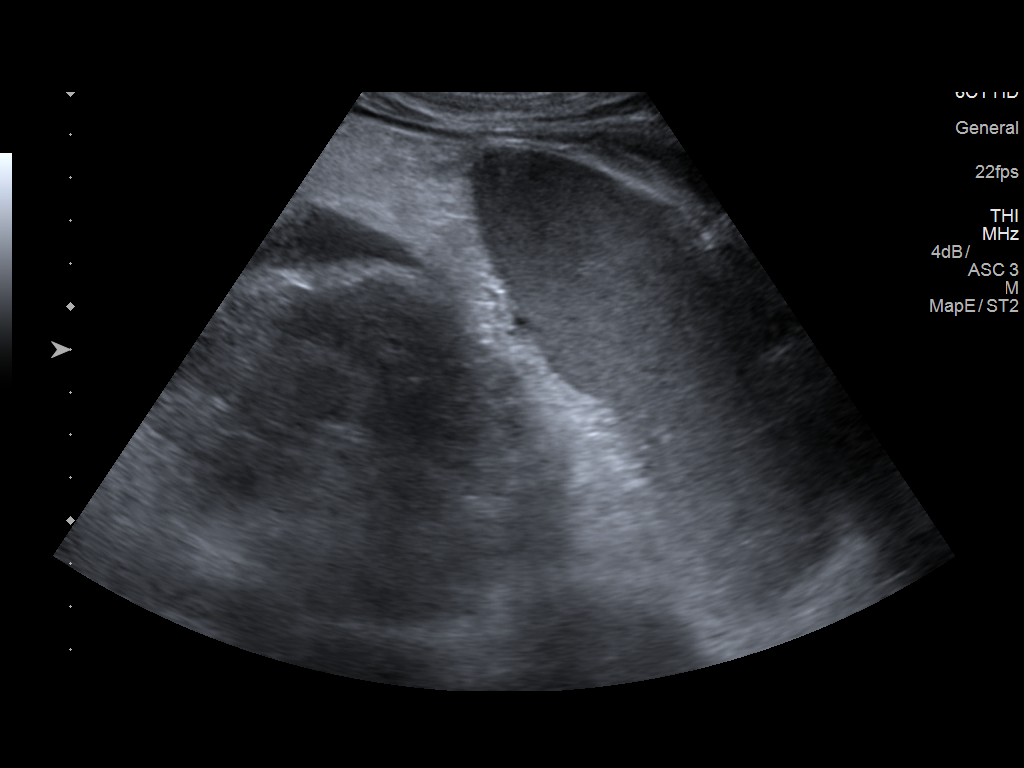
[im 5/5]
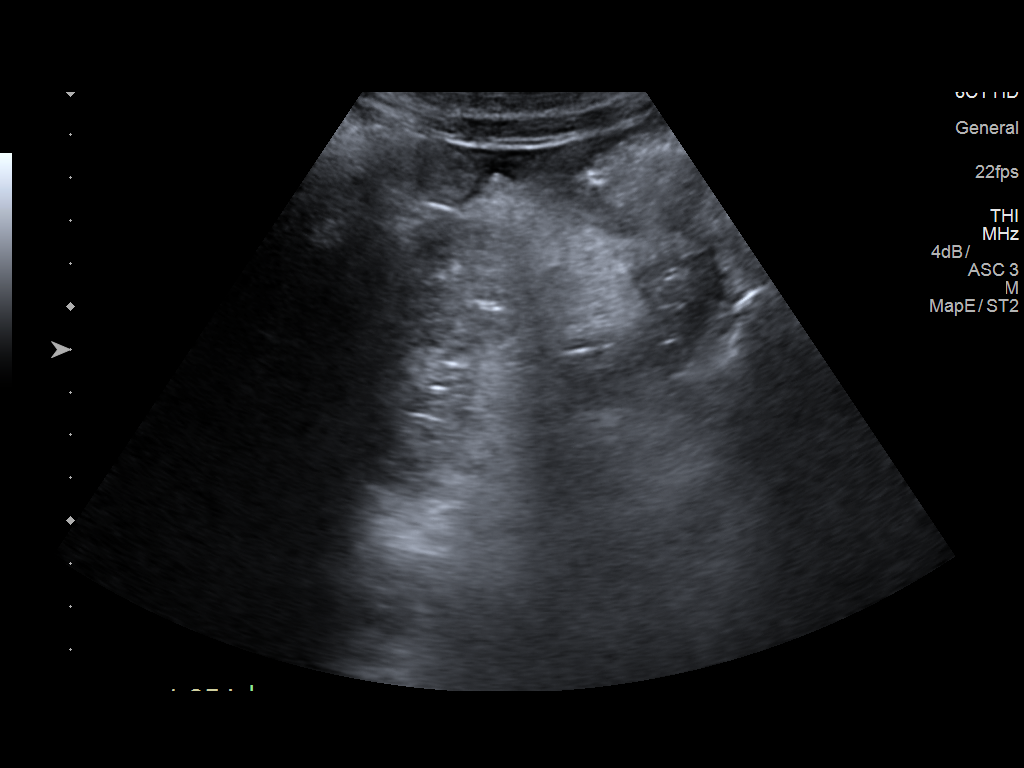

[5 of 5 positions shown; findings below may reference images not displayed]

EXAM:
ULTRASOUND GUIDED DIAGNOSTIC AND THERAPEUTIC PARACENTESIS

MEDICATIONS:
None.

COMPLICATIONS:
None immediate.

PROCEDURE:
Informed written consent was obtained from the patient after a
discussion of the risks, benefits and alternatives to treatment. A
timeout was performed prior to the initiation of the procedure.

Initial ultrasound scanning demonstrates a small amount of ascites
within the left lower abdominal quadrant. The left lower abdomen was
prepped and draped in the usual sterile fashion. 1% lidocaine was
used for local anesthesia.

Following this, a Yueh catheter was introduced. An ultrasound image
was saved for documentation purposes. The paracentesis was
performed. The catheter was removed and a dressing was applied. The
patient tolerated the procedure well without immediate post
procedural complication.
FINDINGS: A total of approximately 1.3 liters of slightly hazy, light yellow
fluid was removed. Samples were sent to the laboratory as requested
by the clinical team.
IMPRESSION: Successful ultrasound-guided diagnostic and therapeutic paracentesis
yielding 1.3 liters liters of peritoneal fluid.

## 2018-09-29 ENCOUNTER — Emergency Department (HOSPITAL_COMMUNITY): Payer: Medicaid Other

## 2018-09-29 ENCOUNTER — Inpatient Hospital Stay (HOSPITAL_COMMUNITY)
Admission: EM | Admit: 2018-09-29 | Discharge: 2018-10-12 | DRG: 539 | Disposition: A | Discharge status: Home Health | Payer: Medicaid Other | Attending: Internal Medicine | Admitting: Internal Medicine

## 2018-09-29 ENCOUNTER — Encounter (HOSPITAL_COMMUNITY): Payer: Self-pay

## 2018-09-29 ENCOUNTER — Other Ambulatory Visit: Payer: Self-pay

## 2018-09-29 DIAGNOSIS — F17210 Nicotine dependence, cigarettes, uncomplicated: Secondary | ICD-10-CM | POA: Diagnosis present

## 2018-09-29 DIAGNOSIS — M869 Osteomyelitis, unspecified: Secondary | ICD-10-CM

## 2018-09-29 DIAGNOSIS — M4646 Discitis, unspecified, lumbar region: Secondary | ICD-10-CM | POA: Diagnosis present

## 2018-09-29 DIAGNOSIS — E440 Moderate protein-calorie malnutrition: Secondary | ICD-10-CM | POA: Diagnosis present

## 2018-09-29 DIAGNOSIS — G8929 Other chronic pain: Secondary | ICD-10-CM | POA: Diagnosis present

## 2018-09-29 DIAGNOSIS — F1910 Other psychoactive substance abuse, uncomplicated: Secondary | ICD-10-CM | POA: Diagnosis present

## 2018-09-29 DIAGNOSIS — B181 Chronic viral hepatitis B without delta-agent: Secondary | ICD-10-CM | POA: Diagnosis present

## 2018-09-29 DIAGNOSIS — K6812 Psoas muscle abscess: Secondary | ICD-10-CM | POA: Diagnosis present

## 2018-09-29 DIAGNOSIS — B182 Chronic viral hepatitis C: Secondary | ICD-10-CM | POA: Diagnosis present

## 2018-09-29 DIAGNOSIS — M48061 Spinal stenosis, lumbar region without neurogenic claudication: Secondary | ICD-10-CM | POA: Diagnosis present

## 2018-09-29 DIAGNOSIS — I8510 Secondary esophageal varices without bleeding: Secondary | ICD-10-CM | POA: Diagnosis present

## 2018-09-29 DIAGNOSIS — M4626 Osteomyelitis of vertebra, lumbar region: Principal | ICD-10-CM | POA: Diagnosis present

## 2018-09-29 DIAGNOSIS — M4649 Discitis, unspecified, multiple sites in spine: Secondary | ICD-10-CM

## 2018-09-29 DIAGNOSIS — D6959 Other secondary thrombocytopenia: Secondary | ICD-10-CM | POA: Diagnosis present

## 2018-09-29 DIAGNOSIS — M4645 Discitis, unspecified, thoracolumbar region: Secondary | ICD-10-CM | POA: Diagnosis present

## 2018-09-29 DIAGNOSIS — Z6822 Body mass index (BMI) 22.0-22.9, adult: Secondary | ICD-10-CM

## 2018-09-29 DIAGNOSIS — M4640 Discitis, unspecified, site unspecified: Secondary | ICD-10-CM

## 2018-09-29 DIAGNOSIS — B9689 Other specified bacterial agents as the cause of diseases classified elsewhere: Secondary | ICD-10-CM | POA: Diagnosis present

## 2018-09-29 DIAGNOSIS — K7469 Other cirrhosis of liver: Secondary | ICD-10-CM | POA: Diagnosis present

## 2018-09-29 DIAGNOSIS — F1110 Opioid abuse, uncomplicated: Secondary | ICD-10-CM | POA: Diagnosis present

## 2018-09-29 DIAGNOSIS — K766 Portal hypertension: Secondary | ICD-10-CM | POA: Diagnosis present

## 2018-09-29 DIAGNOSIS — K7031 Alcoholic cirrhosis of liver with ascites: Secondary | ICD-10-CM | POA: Diagnosis present

## 2018-09-29 DIAGNOSIS — M4625 Osteomyelitis of vertebra, thoracolumbar region: Secondary | ICD-10-CM | POA: Diagnosis present

## 2018-09-29 DIAGNOSIS — D638 Anemia in other chronic diseases classified elsewhere: Secondary | ICD-10-CM | POA: Diagnosis present

## 2018-09-29 HISTORY — DX: Depression, unspecified: F32.A

## 2018-09-29 HISTORY — DX: Low back pain: M54.5

## 2018-09-29 HISTORY — DX: Low back pain, unspecified: M54.50

## 2018-09-29 HISTORY — DX: Other chronic pain: G89.29

## 2018-09-29 HISTORY — DX: Major depressive disorder, single episode, unspecified: F32.9

## 2018-09-29 HISTORY — DX: Anxiety disorder, unspecified: F41.9

## 2018-09-29 HISTORY — DX: Personal history of urinary calculi: Z87.442

## 2018-09-29 LAB — I-STAT CG4 LACTIC ACID, ED: Lactic Acid, Venous: 1.19 mmol/L (ref 0.5–1.9)

## 2018-09-29 LAB — CBC
HCT: 34.7 % — ABNORMAL LOW (ref 39.0–52.0)
Hemoglobin: 10.5 g/dL — ABNORMAL LOW (ref 13.0–17.0)
MCH: 25.7 pg — AB (ref 26.0–34.0)
MCHC: 30.3 g/dL (ref 30.0–36.0)
MCV: 84.8 fL (ref 80.0–100.0)
NRBC: 0 % (ref 0.0–0.2)
PLATELETS: 100 10*3/uL — AB (ref 150–400)
RBC: 4.09 MIL/uL — AB (ref 4.22–5.81)
RDW: 15.6 % — AB (ref 11.5–15.5)
WBC: 5.5 10*3/uL (ref 4.0–10.5)

## 2018-09-29 LAB — COMPREHENSIVE METABOLIC PANEL
ALK PHOS: 137 U/L — AB (ref 38–126)
ALT: 14 U/L (ref 0–44)
ANION GAP: 6 (ref 5–15)
AST: 18 U/L (ref 15–41)
Albumin: 2.2 g/dL — ABNORMAL LOW (ref 3.5–5.0)
BILIRUBIN TOTAL: 0.4 mg/dL (ref 0.3–1.2)
BUN: 19 mg/dL (ref 6–20)
CALCIUM: 8 mg/dL — AB (ref 8.9–10.3)
CO2: 29 mmol/L (ref 22–32)
CREATININE: 0.67 mg/dL (ref 0.61–1.24)
Chloride: 102 mmol/L (ref 98–111)
GFR calc Af Amer: >60 mL/min (ref >60)
GFR calc non Af Amer: >60 mL/min (ref >60)
Glucose, Bld: 105 mg/dL — ABNORMAL HIGH (ref 70–99)
Potassium: 3.9 mmol/L (ref 3.5–5.1)
Sodium: 137 mmol/L (ref 135–145)
TOTAL PROTEIN: 7.3 g/dL (ref 6.5–8.1)

## 2018-09-29 LAB — LIPASE, BLOOD: Lipase: 42 U/L (ref 11–51)

## 2018-09-29 MED ORDER — FLUORESCEIN SODIUM 1 MG OP STRP
1.0000 | ORAL_STRIP | Freq: Once | OPHTHALMIC | Status: AC
  Administered 2018-09-29: 1 via OPHTHALMIC
  Filled 2018-09-29: qty 1

## 2018-09-29 MED ORDER — PIPERACILLIN-TAZOBACTAM 3.375 G IVPB 30 MIN
3.3750 g | INTRAVENOUS | Status: AC
  Administered 2018-09-29: 3.375 g via INTRAVENOUS
  Filled 2018-09-29: qty 50

## 2018-09-29 MED ORDER — VANCOMYCIN HCL 10 G IV SOLR
1500.0000 mg | Freq: Once | INTRAVENOUS | Status: AC
  Administered 2018-09-30: 1500 mg via INTRAVENOUS
  Filled 2018-09-29: qty 1500

## 2018-09-29 MED ORDER — SODIUM CHLORIDE 0.9 % IV SOLN
1000.0000 mL | INTRAVENOUS | Status: DC
  Administered 2018-09-30 – 2018-10-01 (×2): 1000 mL via INTRAVENOUS

## 2018-09-29 MED ORDER — IOPAMIDOL (ISOVUE-300) INJECTION 61%
100.0000 mL | Freq: Once | INTRAVENOUS | Status: AC | PRN
  Administered 2018-09-29: 100 mL via INTRAVENOUS

## 2018-09-29 MED ORDER — SODIUM CHLORIDE 0.9 % IJ SOLN
INTRAMUSCULAR | Status: AC
  Filled 2018-09-29: qty 50

## 2018-09-29 MED ORDER — MORPHINE SULFATE (PF) 4 MG/ML IV SOLN
4.0000 mg | Freq: Once | INTRAVENOUS | Status: AC
  Administered 2018-09-29: 4 mg via INTRAVENOUS
  Filled 2018-09-29: qty 1

## 2018-09-29 MED ORDER — TETRACAINE HCL 0.5 % OP SOLN
2.0000 [drp] | Freq: Once | OPHTHALMIC | Status: AC
  Administered 2018-09-29: 2 [drp] via OPHTHALMIC
  Filled 2018-09-29: qty 4

## 2018-09-29 MED ORDER — SODIUM CHLORIDE 0.9 % IV BOLUS (SEPSIS)
1000.0000 mL | Freq: Once | INTRAVENOUS | Status: AC
  Administered 2018-09-29: 1000 mL via INTRAVENOUS

## 2018-09-29 MED ORDER — IOPAMIDOL (ISOVUE-300) INJECTION 61%
INTRAVENOUS | Status: AC
  Filled 2018-09-29: qty 100

## 2018-09-29 NOTE — ED Triage Notes (Addendum)
patient c/o bilateral leg pain and abdominal distention x 2 months. Patient states he has had a normal BM today. Patient also c/o bilateral low back pain that he says he has had forever. 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 Patient las used yesterday

## 2018-09-29 NOTE — Progress Notes (Signed)
A consult was received from an ED physician for zosyn and vancomycin per pharmacy dosing.  The patient's profile has been reviewed for ht/wt/allergies/indication/available labs.   A one time order has been placed for Zosyn 3.375 and Vancomycin 1500 mg.  Further antibiotics/pharmacy consults should be ordered by admitting physician if indicated.                       Thank you, Lorenza Evangelist 09/29/2018  11:14 PM

## 2018-09-29 NOTE — ED Provider Notes (Signed)
[facility]-EMERGENCY DEPT Provider Note   CSN: [id number] Arrival date & time: 09/29/18  1758     History   Chief Complaint Chief Complaint  Patient presents with  . Leg Pain  . abdominal distention  . Back Pain    HPI Dan [name] is a 47 y.o. male presenting for evaluation of abdominal pain, back pain, and hematuria.  Patient states for the past 3 to 4 weeks, he has been having gradually worsening midline back pain and abdominal pain.  Abdominal pain is generalized and constant.  Worse with p.o.  Patient states his back pain is preventing him from being able to walk.  He is also reporting bilateral leg pain.  Patient states he has been having fevers and chills for the past several weeks.  He reports 2 weeks of hematuria and abdominal pain with urination.  He denies chest pain, shortness of breath, nausea, or vomiting.  He is having normal bowel movements. Additionally, when prompted patient states he has been having vision loss of his left eye.  This is been going on for 3 weeks.  It began with periorbital redness and swelling which resolved after several days.  Since then, patient has had decreased vision of his left eye.  He has not had this evaluated. Patient smokes a pack of cigarettes a day and uses IV heroin, last used yesterday.  He denies any further drug use.  Denies alcohol use. Pt states he has lost 100 lbs in the past 8 months.  Additional history obtained from chart review, patient with a history of cirrhosis, gastric ulcer, hep B, hep C, polysubstance abuse.  Patient was admitted 5 months ago, supposed to be on antibiotics but he states he is not taking them.   HPI  Past Medical History:  Diagnosis Date  . Chronic pain   . Cirrhosis of liver (HCC)   . Gastric ulceration   . Hepatitis B   . Hepatitis C   . Polysubstance abuse (HCC)   . Upper GI bleed     Patient Active Problem List   Diagnosis Date Noted  . Chronic pain syndrome  10/31/2016  . IV drug abuse (HCC) 10/31/2016  . Thrombocytopenia (HCC) 10/28/2016  . Candidemia (HCC)   . Chronic gastric ulcer with hemorrhage   . Decompensated HCV cirrhosis (HCC)   . Chronic giant gastric ulcer   . Chronic hepatitis B (HCC)   . Chronic hepatitis C without hepatic coma (HCC)   . Other cirrhosis of liver (HCC) 07/14/2016  . Abdominal pain 07/14/2016    Past Surgical History:  Procedure Laterality Date  . ESOPHAGOGASTRODUODENOSCOPY N/A 07/16/2016   Procedure: ESOPHAGOGASTRODUODENOSCOPY (EGD);  Surgeon: Ruffin Frederick, MD;  Location: [facility] ENDOSCOPY;  Service: Gastroenterology;  Laterality: N/A;  . ESOPHAGOGASTRODUODENOSCOPY N/A 10/08/2016   Procedure: ESOPHAGOGASTRODUODENOSCOPY (EGD);  Surgeon: Sherrilyn Rist, MD;  Location: [facility] ENDOSCOPY;  Service: Endoscopy;  Laterality: N/A;  . LUNG SURGERY     right lobectomy  . TEE WITHOUT CARDIOVERSION N/A 10/30/2016   Procedure: TRANSESOPHAGEAL ECHOCARDIOGRAM (TEE);  Surgeon: Pricilla Riffle, MD;  Location: [facility] ENDOSCOPY;  Service: Cardiovascular;  Laterality: N/A;        Home Medications    Prior to Admission medications   Medication Sig Start Date End Date Taking? Authorizing Provider  cephALEXin (KEFLEX) 500 MG capsule Take 1 capsule (500 mg total) by mouth 3 (three) times daily. Patient not taking: Reported on 09/29/2018 05/11/18   Fayrene Helper, PA-C  oxyCODONE-acetaminophen (  PERCOCET) 5-325 MG tablet Take 1 tablet by mouth every 6 (six) hours as needed for moderate pain or severe pain. Patient not taking: Reported on 09/29/2018 05/11/18   Fayrene Helper, PA-C    Family History Family History  Problem Relation Age of Onset  . Dementia Mother     Social History Social History   Tobacco Use  . Smoking status: Current Every Day Smoker    Packs/day: 1.00    Types: Cigarettes  . Smokeless tobacco: Never Used  Substance Use Topics  . Alcohol use: No  . Drug use: Yes    Comment: heroin use      Allergies     Patient has no known allergies.   Review of Systems Review of Systems  Constitutional: Positive for chills, fever and unexpected weight change.  Gastrointestinal: Positive for abdominal pain.  Genitourinary: Positive for hematuria.  Musculoskeletal: Positive for back pain.  All other systems reviewed and are negative.    Physical Exam Updated Vital Signs BP (!) 142/87   Pulse 82   Temp 99.5 F (37.5 C) (Rectal)   Resp 16   Ht 5\' 11"  (1.803 m)   Wt 63.5 kg   SpO2 98%   BMI 19.53 kg/m   Physical Exam  Constitutional: He is oriented to person, place, and time.  Thin, chronically ill appearing male. Feels hot to the touch  HENT:  Head: Normocephalic and atraumatic.  Eyes: Conjunctivae and EOM are normal. Left pupil is not reactive. Pupils are unequal.  Slit lamp exam:      The left eye shows no corneal abrasion, no corneal ulcer, no foreign body and no fluorescein uptake.  Left pupil not responsive to light and equal.  EOMI.  No periorbital edema or erythema. No fluorescein stain uptake or corneal abrasion. tonopen 20 in L eye  Neck: Normal range of motion. Neck supple.  Cardiovascular: Normal rate, regular rhythm and intact distal pulses.  Pulmonary/Chest: Effort normal and breath sounds normal. No respiratory distress. He has no wheezes.  Abdominal: Soft. He exhibits distension. He exhibits no mass. There is generalized tenderness. There is guarding. There is no rebound.  Patient was generalized abdominal tenderness with guarding.  No rigidity or distention.  No masses.  Musculoskeletal: Normal range of motion. He exhibits tenderness.  TTP of bilateral lower legs. No obvious edema. Sensation intact bilaterally.  TTP of midline spine. No step offs.   Neurological: He is alert and oriented to person, place, and time. He displays normal reflexes. No sensory deficit.  No obvious neurologic deficits. No saddle paresthesia. No hyper or hypo reflexia.   Skin: Skin is warm and  dry. Capillary refill takes less than 2 seconds.  Psychiatric: He has a normal mood and affect.  Nursing note and vitals reviewed.    ED Treatments / Results  Labs (all labs ordered are listed, but only abnormal results are displayed) Labs Reviewed  COMPREHENSIVE METABOLIC PANEL - Abnormal; Notable for the following components:      Result Value   Glucose, Bld 105 (*)    Calcium 8.0 (*)    Albumin 2.2 (*)    Alkaline Phosphatase 137 (*)    All other components within normal limits  CBC - Abnormal; Notable for the following components:   RBC 4.09 (*)    Hemoglobin 10.5 (*)    HCT 34.7 (*)    MCH 25.7 (*)    RDW 15.6 (*)    Platelets 100 (*)    All other  components within normal limits  URINALYSIS, ROUTINE W REFLEX MICROSCOPIC - Abnormal; Notable for the following components:   Color, Urine AMBER (*)    APPearance HAZY (*)    All other components within normal limits  CULTURE, BLOOD (ROUTINE X 2)  CULTURE, BLOOD (ROUTINE X 2)  URINE CULTURE  LIPASE, BLOOD  I-STAT CG4 LACTIC ACID, ED  I-STAT CG4 LACTIC ACID, ED    EKG EKG Interpretation  Date/Time:  Friday September 30 2018 00:24:00 EDT Ventricular Rate:  86 PR Interval:  116 QRS Duration: 84 QT Interval:  376 QTC Calculation: 449 R Axis:   84 Text Interpretation:  Normal sinus rhythm Normal ECG No acute changes No significant change since last tracing Confirmed by Derwood Kaplan [id number]) on 09/30/2018 1:40:32 AM   Radiology Dg Chest 2 View  Result Date: 09/29/2018 CLINICAL DATA:  Fever, body aches. EXAM: CHEST - 2 VIEW COMPARISON:  Radiograph 08/21/2017 FINDINGS: Curvilinear lucencies paralleling the right chest Doto consistent with skin folds, lung markings are seen extending to the pleural surface. Similar appearance in the left mid thorax to a lesser extent. Chronic bronchial thickening and right upper lobe scarring. No acute airspace disease. Minimal blunting of right costophrenic angle, possible trace effusion.  No pulmonary edema. No acute osseous abnormalities. IMPRESSION: 1. Chronic bronchitic change in right upper lobe scarring. No acute airspace disease. 2. Curvilinear lucencies paralleling right greater than left chest walls consistent with skin folds. Electronically Signed   By: Narda Rutherford M.D.   On: 09/29/2018 23:15   Ct Head Wo Contrast  Result Date: 09/30/2018 CLINICAL DATA:  Subacute left vision loss, unable to see out of left eye for 1 month EXAM: CT HEAD WITHOUT CONTRAST TECHNIQUE: Contiguous axial images were obtained from the base of the skull through the vertex without intravenous contrast. COMPARISON:  CT brain 05/30/2013 FINDINGS: Brain: No evidence of acute infarction, hemorrhage, hydrocephalus, extra-axial collection or mass lesion/mass effect. Vascular: No hyperdense vessel or unexpected calcification. Skull: Normal. Negative for fracture or focal lesion. Sinuses/Orbits: No acute finding. Other: None IMPRESSION: Negative.  No CT evidence for acute intracranial abnormality Electronically Signed   By: Jasmine Pang M.D.   On: 09/30/2018 00:33   Ct Abdomen Pelvis W Contrast  Result Date: 09/30/2018 CLINICAL DATA:  Abd pain, acute, generalized. Patient reports bilateral leg pain and abdominal distension. Patient reports low back pain for "forever". EXAM: CT ABDOMEN AND PELVIS WITH CONTRAST TECHNIQUE: Multidetector CT imaging of the abdomen and pelvis was performed using the standard protocol following bolus administration of intravenous contrast. CONTRAST:  ISOVUE-300 IOPAMIDOL (ISOVUE-300) INJECTION 61% COMPARISON:  Abdominal CT 11/19/2016. Lumbar spine radiograph 05/09/2018. FINDINGS: Lower chest: Distal esophageal Monreal thickening. Minimal subpleural scarring in the right lower lobe. No pleural fluid. Hepatobiliary: Cirrhotic hepatic morphology with nodular contours. Diffusely heterogeneous hepatic parenchyma without evidence of focal mass. Gallbladder physiologically distended, no  calcified stone. No biliary dilatation. Pancreas: No ductal dilatation or inflammation. Spleen: Splenomegaly with spleen measuring 21.1 cm cranial caudal. No focal abnormality. Adrenals/Urinary Tract: No adrenal nodule. No hydronephrosis or perinephric edema. Homogeneous renal enhancement with symmetric excretion on delayed phase imaging. Urinary bladder is only minimally distended and not well evaluated. Stomach/Bowel: Bowel evaluation is limited in the absence of enteric contrast and paucity of intra-abdominal fat. There is Lasalle thickening of the distal esophagus. Equivocal gastric Duffner thickening versus nondistention. No small bowel dilatation or obstruction. Small to moderate volume of stool throughout the colon. Appendix tentatively identified, no evidence of appendicitis. Vascular/Lymphatic: Aortic  atherosclerosis without aneurysm. Dilatation of the main portal vein at 2 cm, no visualized portal vein thrombus. Retroaortic left renal vein. Multiple prominent retroperitoneal nodes are likely reactive. Prominent portacaval node measuring 17 mm is nonspecific. Reproductive: Prostate is unremarkable. Other: Small volume of perihepatic ascites. Generalized mesenteric edema. No free air. Musculoskeletal: Bony destruction centered at L3-L4 with disc space loss and associated endplate irregularity. Ill-defined perivertebral fluid collection within the right ileus psoas muscle, for example image 46 series 2 consistent with abscess. Posterior extent causes mass effect on the spinal canal. There is irregular widening of the right sacroiliac joint that has progressed from right hip CT 05/11/2018. Endplate irregularity with disc space narrowing at T12-L1 is new from lumbar spine radiographs. IMPRESSION: 1. 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 Additional endplate irregularity at T12-L1 is suspicious for additional sites of  discitis/osteomyelitis. Progressive right sacroiliac joint widening and irregularity suspicious for septic joint. Recommend MRI to characterize extent and evaluate for epidural abscess. 2. Cirrhosis with portal hypertension and splenomegaly. 3. Distal esophageal Aymond thickening may be secondary to varices. 4.  Aortic Atherosclerosis (ICD10-I70.0). These results were called by telephone at the time of interpretation on 09/30/2018 at 12:45 am to PA [facility] , who verbally acknowledged these results. Electronically Signed   By: Narda Rutherford M.D.   On: 09/30/2018 00:47    Procedures .Critical Care Performed by: Alveria Apley, PA-C Authorized by: Alveria Apley, PA-C   Critical care provider statement:    Critical care time (minutes):  45   Critical care time was exclusive of:  Separately billable procedures and treating other patients and teaching time   Critical care was necessary to treat or prevent imminent or life-threatening deterioration of the following conditions:  CNS failure or compromise   Critical care was time spent personally by me on the following activities:  Blood draw for specimens, development of treatment plan with patient or surrogate, evaluation of patient's response to treatment, examination of patient, obtaining history from patient or surrogate, ordering and performing treatments and interventions, ordering and review of laboratory studies, ordering and review of radiographic studies, pulse oximetry, re-evaluation of patient's condition and review of old charts   I assumed direction of critical care for this patient from another provider in my specialty: no   Comments:     Pt with discitis and osteomyelitis of the spine. IV abx started   (including critical care time)  Medications Ordered in ED Medications  sodium chloride 0.9 % bolus 1,000 mL (0 mLs Intravenous Stopped 09/30/18 0049)    Followed by  0.9 %  sodium chloride infusion (1,000 mLs Intravenous  New Bag/Given 09/30/18 0032)  morphine 4 MG/ML injection 4 mg (4 mg Intravenous Given 09/29/18 2307)  tetracaine (PONTOCAINE) 0.5 % ophthalmic solution 2 drop (2 drops Left Eye Given 09/29/18 2307)  fluorescein ophthalmic strip 1 strip (1 strip Left Eye Given 09/29/18 2307)  piperacillin-tazobactam (ZOSYN) IVPB 3.375 g ( Intravenous Stopped 09/29/18 2359)  vancomycin (VANCOCIN) 1,500 mg in sodium chloride 0.9 % 500 mL IVPB (1,500 mg Intravenous New Bag/Given 09/30/18 0029)  iopamidol (ISOVUE-300) 61 % injection 100 mL (100 mLs Intravenous Contrast Given 09/29/18 2357)  acetaminophen (TYLENOL) tablet 650 mg (650 mg Oral Given 09/30/18 0056)  ketorolac (TORADOL) 15 MG/ML injection 15 mg (15 mg Intravenous Given 09/30/18 0057)  ondansetron (ZOFRAN) injection 4 mg (4 mg Intravenous Given 09/30/18 0219)     Initial Impression /  Assessment and Plan / ED Course  I have reviewed the triage vital signs and the nursing notes.  Pertinent labs & imaging results that were available during my care of the patient were reviewed by me and considered in my medical decision making (see chart for details).     Pt presenting for evaluation of abdominal pain and back pain.  Physical exam concerning, patient appears chronically ill and feels warm to the touch.  Generalized abdominal tenderness.  Patient reporting inability to walk due to pain.  Patient with a history of IV drug use.  Recent admissions for ascites, was supposed to start antibiotics but never did.  This time, concern for systemic infection, intra-abdominal infection.  As patient is having new back pain preventing him from walking, concern for discitis.  No neurologic deficits or bowel/bladder incontinence, doubt spinal cord compression at this time.  Will obtain labs, blood cultures, urine, chest x-ray, EKG, and CT abdomen pelvis.  Small dose of morphine given for pain control.  Will start broad spectrum antibiotics. Additionally, patient with visual  changes. Will obtain Ct head and start eye exam.   Labs reassuring, patient without leukocytosis.  Hemoglobin stable at 10.5.  Lactic negative.  Chest x-ray viewed interpreted by me, no pneumonia, pneumothorax, effusion, or cardiomegaly.  EKG without stemi. Pain not improved with morphine.  Will give Tylenol and Toradol. Ct head without acute abnormalities. Eye exam reassuring. Pressure normal, doubt glaucoma. No obvious eye injury.   CT abdomen concerning for discitis and osteomyelitis of T12, L3, L4, and concern for septic joint of the right SI.  Radiology recommends MRI to rule out epidural abscess. Case discussed with attending, Dr. Rhunette Croft agrees to plan. Will transfer to [facility] for MRI and admission with likely need to consult neurosurgery.  Discussed findings and plan with patient, who is agreeable to transfer.  Discussed with Dr. Judd Lien, who accepts the patient.  Final Clinical Impressions(s) / ED Diagnoses   Final diagnoses:  Discitis of multiple sites of spine  Osteomyelitis of other site, unspecified type [facility])    ED Discharge Orders    None       Alveria Apley, PA-C 09/30/18 0257    Derwood Kaplan, MD 09/30/18 [id number]

## 2018-09-30 ENCOUNTER — Inpatient Hospital Stay (HOSPITAL_COMMUNITY): Payer: Medicaid Other

## 2018-09-30 ENCOUNTER — Encounter (HOSPITAL_COMMUNITY): Payer: Self-pay | Admitting: General Practice

## 2018-09-30 ENCOUNTER — Emergency Department (HOSPITAL_COMMUNITY): Payer: Medicaid Other

## 2018-09-30 DIAGNOSIS — M4645 Discitis, unspecified, thoracolumbar region: Secondary | ICD-10-CM | POA: Diagnosis present

## 2018-09-30 DIAGNOSIS — K6812 Psoas muscle abscess: Secondary | ICD-10-CM | POA: Diagnosis present

## 2018-09-30 DIAGNOSIS — M869 Osteomyelitis, unspecified: Secondary | ICD-10-CM | POA: Diagnosis not present

## 2018-09-30 DIAGNOSIS — F17210 Nicotine dependence, cigarettes, uncomplicated: Secondary | ICD-10-CM | POA: Diagnosis present

## 2018-09-30 DIAGNOSIS — K7469 Other cirrhosis of liver: Secondary | ICD-10-CM | POA: Diagnosis not present

## 2018-09-30 DIAGNOSIS — G8929 Other chronic pain: Secondary | ICD-10-CM | POA: Diagnosis present

## 2018-09-30 DIAGNOSIS — M4646 Discitis, unspecified, lumbar region: Secondary | ICD-10-CM | POA: Diagnosis present

## 2018-09-30 DIAGNOSIS — M4626 Osteomyelitis of vertebra, lumbar region: Secondary | ICD-10-CM | POA: Diagnosis present

## 2018-09-30 DIAGNOSIS — M48061 Spinal stenosis, lumbar region without neurogenic claudication: Secondary | ICD-10-CM | POA: Diagnosis not present

## 2018-09-30 DIAGNOSIS — M4649 Discitis, unspecified, multiple sites in spine: Secondary | ICD-10-CM | POA: Diagnosis not present

## 2018-09-30 DIAGNOSIS — R634 Abnormal weight loss: Secondary | ICD-10-CM | POA: Diagnosis not present

## 2018-09-30 DIAGNOSIS — E440 Moderate protein-calorie malnutrition: Secondary | ICD-10-CM | POA: Diagnosis present

## 2018-09-30 DIAGNOSIS — K7031 Alcoholic cirrhosis of liver with ascites: Secondary | ICD-10-CM | POA: Diagnosis present

## 2018-09-30 DIAGNOSIS — F1910 Other psychoactive substance abuse, uncomplicated: Secondary | ICD-10-CM | POA: Diagnosis not present

## 2018-09-30 DIAGNOSIS — I340 Nonrheumatic mitral (valve) insufficiency: Secondary | ICD-10-CM | POA: Diagnosis not present

## 2018-09-30 DIAGNOSIS — R7881 Bacteremia: Secondary | ICD-10-CM | POA: Diagnosis not present

## 2018-09-30 DIAGNOSIS — M4625 Osteomyelitis of vertebra, thoracolumbar region: Secondary | ICD-10-CM | POA: Diagnosis present

## 2018-09-30 DIAGNOSIS — K3189 Other diseases of stomach and duodenum: Secondary | ICD-10-CM | POA: Diagnosis not present

## 2018-09-30 DIAGNOSIS — K314 Gastric diverticulum: Secondary | ICD-10-CM | POA: Diagnosis not present

## 2018-09-30 DIAGNOSIS — I8510 Secondary esophageal varices without bleeding: Secondary | ICD-10-CM | POA: Diagnosis present

## 2018-09-30 DIAGNOSIS — M545 Low back pain: Secondary | ICD-10-CM | POA: Diagnosis present

## 2018-09-30 DIAGNOSIS — B182 Chronic viral hepatitis C: Secondary | ICD-10-CM | POA: Diagnosis present

## 2018-09-30 DIAGNOSIS — D6959 Other secondary thrombocytopenia: Secondary | ICD-10-CM | POA: Diagnosis not present

## 2018-09-30 DIAGNOSIS — F1110 Opioid abuse, uncomplicated: Secondary | ICD-10-CM | POA: Diagnosis present

## 2018-09-30 DIAGNOSIS — B9689 Other specified bacterial agents as the cause of diseases classified elsewhere: Secondary | ICD-10-CM | POA: Diagnosis not present

## 2018-09-30 DIAGNOSIS — B181 Chronic viral hepatitis B without delta-agent: Secondary | ICD-10-CM | POA: Diagnosis present

## 2018-09-30 DIAGNOSIS — Z6822 Body mass index (BMI) 22.0-22.9, adult: Secondary | ICD-10-CM | POA: Diagnosis not present

## 2018-09-30 DIAGNOSIS — K766 Portal hypertension: Secondary | ICD-10-CM | POA: Diagnosis present

## 2018-09-30 DIAGNOSIS — D638 Anemia in other chronic diseases classified elsewhere: Secondary | ICD-10-CM | POA: Diagnosis not present

## 2018-09-30 LAB — URINALYSIS, ROUTINE W REFLEX MICROSCOPIC
BILIRUBIN URINE: NEGATIVE
Glucose, UA: NEGATIVE mg/dL
Hgb urine dipstick: NEGATIVE
KETONES UR: NEGATIVE mg/dL
Leukocytes, UA: NEGATIVE
NITRITE: NEGATIVE
Protein, ur: NEGATIVE mg/dL
SPECIFIC GRAVITY, URINE: 1.021 (ref 1.005–1.030)
pH: 7 (ref 5.0–8.0)

## 2018-09-30 LAB — ECHOCARDIOGRAM COMPLETE
Height: 71 in
WEIGHTICAEL: 2240 [oz_av]

## 2018-09-30 LAB — HIV ANTIBODY (ROUTINE TESTING W REFLEX): HIV Screen 4th Generation wRfx: NONREACTIVE

## 2018-09-30 LAB — I-STAT CG4 LACTIC ACID, ED: Lactic Acid, Venous: 0.76 mmol/L (ref 0.5–1.9)

## 2018-09-30 LAB — PROTIME-INR
INR: 1.24
PROTHROMBIN TIME: 15.5 s — AB (ref 11.4–15.2)

## 2018-09-30 MED ORDER — ENSURE ENLIVE PO LIQD
237.0000 mL | Freq: Two times a day (BID) | ORAL | Status: DC
  Administered 2018-09-30 – 2018-10-01 (×2): 237 mL via ORAL

## 2018-09-30 MED ORDER — ACETAMINOPHEN 325 MG PO TABS
650.0000 mg | ORAL_TABLET | Freq: Once | ORAL | Status: AC
  Administered 2018-09-30: 650 mg via ORAL
  Filled 2018-09-30: qty 2

## 2018-09-30 MED ORDER — ACETAMINOPHEN 325 MG PO TABS
650.0000 mg | ORAL_TABLET | Freq: Four times a day (QID) | ORAL | Status: DC | PRN
  Administered 2018-09-30 – 2018-10-01 (×3): 650 mg via ORAL
  Filled 2018-09-30 (×3): qty 2

## 2018-09-30 MED ORDER — ONDANSETRON HCL 4 MG/2ML IJ SOLN
4.0000 mg | Freq: Once | INTRAMUSCULAR | Status: AC
  Administered 2018-09-30: 4 mg via INTRAVENOUS
  Filled 2018-09-30: qty 2

## 2018-09-30 MED ORDER — LORAZEPAM 2 MG/ML IJ SOLN
INTRAMUSCULAR | Status: AC
  Administered 2018-09-30: 1 mg
  Filled 2018-09-30: qty 1

## 2018-09-30 MED ORDER — GADOBUTROL 1 MMOL/ML IV SOLN
7.5000 mL | Freq: Once | INTRAVENOUS | Status: AC | PRN
  Administered 2018-09-30: 6.3 mL via INTRAVENOUS

## 2018-09-30 MED ORDER — OXYCODONE HCL 5 MG PO TABS
5.0000 mg | ORAL_TABLET | ORAL | Status: DC | PRN
  Administered 2018-09-30 – 2018-10-01 (×8): 5 mg via ORAL
  Filled 2018-09-30 (×8): qty 1

## 2018-09-30 MED ORDER — KETOROLAC TROMETHAMINE 15 MG/ML IJ SOLN
15.0000 mg | Freq: Once | INTRAMUSCULAR | Status: AC
  Administered 2018-09-30: 15 mg via INTRAVENOUS
  Filled 2018-09-30: qty 1

## 2018-09-30 NOTE — ED Notes (Signed)
Transported to MRI

## 2018-09-30 NOTE — H&P (Signed)
History and Physical    Dan [name] ZOX:[id number] DOB: November 12, 1971 DOA: 09/29/2018  I have briefly reviewed the patient's prior medical records in [facility]  PCP: [name], NP  Patient coming from: home  Chief Complaint: back pain  HPI: Dan [name] is a 47 y.o. 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  Patient tells me that he has been experiencing progressively worsening back pain over the last month.  He is using IV drugs to treat his pain.  He also reports subjective fevers and chills at home.  He is also been having poor p.o. Intake.  Denies any nausea or vomiting, does complain of chronic mild abdominal pain.  No chest pain, no shortness of breath.  No lightheadedness or dizziness.  No joint pain, no rashes or bumps on his skin.  ED Course: In the emergency room he is afebrile, normotensive, satting well on room air.  His blood work shows normal renal function, CBC shows a white count of 5.5.  Lactic acid is normal.  He was given vancomycin and Zosyn by EDP and we were asked to admit.  Review of Systems: As per HPI otherwise 10 point review of systems negative.   Past Medical History:  Diagnosis Date  . Chronic pain   . Cirrhosis of liver (HCC)   . Gastric ulceration   . Hepatitis B   . Hepatitis C   . Polysubstance abuse (HCC)   . Upper GI bleed     Past Surgical History:  Procedure Laterality Date  . ESOPHAGOGASTRODUODENOSCOPY N/A 07/16/2016   Procedure: ESOPHAGOGASTRODUODENOSCOPY (EGD);  Surgeon: Ruffin Frederick, MD;  Location: [facility] ENDOSCOPY;  Service: Gastroenterology;  Laterality: N/A;  . ESOPHAGOGASTRODUODENOSCOPY N/A 10/08/2016   Procedure: ESOPHAGOGASTRODUODENOSCOPY (EGD);  Surgeon: Sherrilyn Rist, MD;  Location: [facility] ENDOSCOPY;  Service: Endoscopy;  Laterality: N/A;  . LUNG SURGERY     right lobectomy  . TEE WITHOUT CARDIOVERSION N/A 10/30/2016   Procedure: TRANSESOPHAGEAL ECHOCARDIOGRAM (TEE);  Surgeon: Pricilla Riffle, MD;  Location: [facility] ENDOSCOPY;  Service: Cardiovascular;  Laterality: N/A;     reports that he has been smoking cigarettes. He has been smoking about 1.00 pack per day. He has never used smokeless tobacco. He reports that he has current or past drug history. He reports that he does not drink alcohol.  No Known Allergies  Family History  Problem Relation Age of Onset  . Dementia Mother     Prior to Admission medications   Medication Sig Start Date End Date Taking? Authorizing Provider  cephALEXin (KEFLEX) 500 MG capsule Take 1 capsule (500 mg total) by mouth 3 (three) times daily. Patient not taking: Reported on 09/29/2018 05/11/18   Fayrene Helper, PA-C  oxyCODONE-acetaminophen (PERCOCET) 5-325 MG tablet Take 1 tablet by mouth every 6 (six) hours as needed for moderate pain or severe pain. Patient not taking: Reported on 09/29/2018 05/11/18   Fayrene Helper, PA-C    Physical Exam: Vitals:   09/30/18 0601 09/30/18 0615 09/30/18 0630 09/30/18 0730  BP: (!) 118/91 117/86 109/84 121/84  Pulse: 72 73 (!) 55   Resp: 16     Temp: 97.8 F (36.6 C)     TempSrc: Oral     SpO2: 96% 96% 91%   Weight:      Height:       Constitutional:  NAD, calm, comfortable Eyes: PERRL, lids and conjunctivae normal ENMT: Mucous membranes are moist. Posterior pharynx clear of any exudate or lesions. Neck: normal, supple Respiratory: clear to auscultation bilaterally, no wheezing, no crackles. Normal respiratory effort. No accessory muscle use.  Cardiovascular: Regular rate and rhythm, no murmurs / rubs / gallops. No extremity edema. 2+ pedal pulses.  Abdomen: no tenderness, no masses palpated. Bowel sounds positive.  Musculoskeletal: no clubbing / cyanosis. Normal muscle tone.  Skin: no rashes seen Neurologic: CN 2-12 grossly intact. Strength 5/5 in all 4.  Psychiatric: Normal  judgment and insight. Alert and oriented x 3. Normal mood.   Labs on Admission: I have personally reviewed following labs and imaging studies  CBC: Recent Labs  Lab 09/29/18 2235  WBC 5.5  HGB 10.5*  HCT 34.7*  MCV 84.8  PLT 100*   Basic Metabolic Panel: Recent Labs  Lab 09/29/18 2235  NA 137  K 3.9  CL 102  CO2 29  GLUCOSE 105*  BUN 19  CREATININE 0.67  CALCIUM 8.0*   GFR: Estimated Creatinine Clearance: 100.3 mL/min (by C-G formula based on SCr of 0.67 mg/dL). Liver Function Tests: Recent Labs  Lab 09/29/18 2235  AST 18  ALT 14  ALKPHOS 137*  BILITOT 0.4  PROT 7.3  ALBUMIN 2.2*   Recent Labs  Lab 09/29/18 2235  LIPASE 42   No results for input(s): AMMONIA in the last 168 hours. Coagulation Profile: No results for input(s): INR, PROTIME in the last 168 hours. Cardiac Enzymes: No results for input(s): CKTOTAL, CKMB, CKMBINDEX, TROPONINI in the last 168 hours. BNP (last 3 results) No results for input(s): PROBNP in the last 8760 hours. HbA1C: No results for input(s): HGBA1C in the last 72 hours. CBG: No results for input(s): GLUCAP in the last 168 hours. Lipid Profile: No results for input(s): CHOL, HDL, LDLCALC, TRIG, CHOLHDL, LDLDIRECT in the last 72 hours. Thyroid Function Tests: No results for input(s): TSH, T4TOTAL, FREET4, T3FREE, THYROIDAB in the last 72 hours. Anemia Panel: No results for input(s): VITAMINB12, FOLATE, FERRITIN, TIBC, IRON, RETICCTPCT in the last 72 hours. Urine analysis:    Component Value Date/Time   COLORURINE AMBER (A) 09/29/2018 2332   APPEARANCEUR HAZY (A) 09/29/2018 2332   LABSPEC 1.021 09/29/2018 2332   PHURINE 7.0 09/29/2018 2332   GLUCOSEU NEGATIVE 09/29/2018 2332   HGBUR NEGATIVE 09/29/2018 2332   BILIRUBINUR NEGATIVE 09/29/2018 2332   KETONESUR NEGATIVE 09/29/2018 2332   PROTEINUR NEGATIVE 09/29/2018 2332   NITRITE NEGATIVE 09/29/2018 2332   LEUKOCYTESUR NEGATIVE 09/29/2018 2332     Radiological Exams on  Admission: Dg Chest 2 View  Result Date: 09/29/2018 CLINICAL DATA:  Fever, body aches. EXAM: CHEST - 2 VIEW COMPARISON:  Radiograph 08/21/2017 FINDINGS: Curvilinear lucencies paralleling the right chest Santini consistent with skin folds, lung markings are seen extending to the pleural surface. Similar appearance in the left mid thorax to a lesser extent. Chronic bronchial thickening and right upper lobe scarring. No acute airspace disease. Minimal blunting of right costophrenic angle, possible trace effusion. No pulmonary edema. No acute osseous abnormalities. IMPRESSION: 1. Chronic bronchitic change in right upper lobe scarring. No acute airspace disease. 2. Curvilinear lucencies paralleling right greater than left chest walls consistent with skin folds. Electronically Signed   By: Narda Rutherford M.D.   On: 09/29/2018 23:15   Ct Head Wo Contrast  Result Date: 09/30/2018 CLINICAL DATA:  Subacute left vision loss, unable to see out of left eye for 1 month EXAM: CT  HEAD WITHOUT CONTRAST TECHNIQUE: Contiguous axial images were obtained from the base of the skull through the vertex without intravenous contrast. COMPARISON:  CT brain 05/30/2013 FINDINGS: Brain: No evidence of acute infarction, hemorrhage, hydrocephalus, extra-axial collection or mass lesion/mass effect. Vascular: No hyperdense vessel or unexpected calcification. Skull: Normal. Negative for fracture or focal lesion. Sinuses/Orbits: No acute finding. Other: None IMPRESSION: Negative.  No CT evidence for acute intracranial abnormality Electronically Signed   By: Jasmine Pang M.D.   On: 09/30/2018 00:33   Mr Lumbar Spine W Wo Contrast  Result Date: 09/30/2018 CLINICAL DATA:  47 y/o  M; abdominal pain, back pain, hematuria. EXAM: MRI LUMBAR SPINE WITHOUT AND WITH CONTRAST TECHNIQUE: Multiplanar and multiecho pulse sequences of the lumbar spine were obtained without and with intravenous contrast. CONTRAST:  6.3 cc Gadavist COMPARISON:   09/30/2018 CT abdomen and pelvis. 05/09/2018 lumbar spine radiographs. 09/19/2011 lumbar spine MRI. FINDINGS: Segmentation:  Standard. Alignment:  Physiologic. Vertebrae: T12-L1 discitis osteomyelitis. Associated anterior and lateral epidural enhancement extending from mid T11 to lower L1 with moderate stenosis of the thecal sac. Additionally, there is paravertebral soft tissue inflammation extending into the bilateral T12-L1 neural foramen. No discrete epidural abscess or paravertebral abscess at these levels. L3-4 discitis osteomyelitis with destructive changes of the opposing endplates, mild-to-moderate loss of vertebral body height, and disc abscess extending into the psoas muscle insertions bilaterally. The right-sided psoas abscess measures 3.8 x 2.1 cm (series 12, image 23) and the left-sided psoas abscess measures 3.0 x 1.7 cm. Paravertebral soft tissue inflammation extends into the bilateral L3-4 neural foramen which are effaced. Additionally, collapse and retropulsion of the posterior aspects of the L3 and L4 vertebral bodies results in severe spinal canal stenosis. No epidural abscess at this level. No additional level of discitis osteomyelitis identified. Conus medullaris and cauda equina: Conus extends to the L1 level. Conus and cauda equina appear normal. No abnormal enhancement. Paraspinal and other soft tissues: As above. Cholelithiasis. Splenomegaly. Disc levels: T12-L1: Epidural soft tissue thickening results in moderate stenosis of the thecal sac. Paravertebral soft tissue inflammation extends into the neural foramen bilaterally. L1-2: Mild disc bulge and facet hypertrophy. No significant foraminal or canal stenosis. L2-3: Mild disc bulge and facet hypertrophy. No significant foraminal or canal stenosis. L3-4: Collapse and retropulsion of the posterior aspects of L3 and L4 vertebral bodies combined with facet hypertrophy. Moderate bilateral foraminal stenosis and severe canal stenosis. L4-5:  Moderate disc bulge and mild facet hypertrophy. Mild bilateral foraminal stenosis and moderate canal stenosis. L5-S1: Mild disc bulge with small central protrusion, central annular fissure, and endplate marginal osteophytes. Mild bilateral foraminal stenosis. No significant canal stenosis. IMPRESSION: 1. T12-L1 discitis osteomyelitis. Associated inflammation and thickening extending from T11-L1 results in moderate stenosis of the thecal sac. No discrete paravertebral or epidural abscess at this level. 2. L3-4 discitis osteomyelitis with moderate collapse of vertebral bodies and bony retropulsion. Bony retropulsion results in severe spinal canal stenosis. 3. L3-4 disc abscess extending into bilateral psoas muscles. No epidural component. 4. Mild discogenic degenerative changes at additional lumbar spinal levels. Electronically Signed   By: Mitzi Hansen M.D.   On: 09/30/2018 05:32   Ct Abdomen Pelvis W Contrast  Result Date: 09/30/2018 CLINICAL DATA:  Abd pain, acute, generalized. Patient reports bilateral leg pain and abdominal distension. Patient reports low back pain for "forever". EXAM: CT ABDOMEN AND PELVIS WITH CONTRAST TECHNIQUE: Multidetector CT imaging of the abdomen and pelvis was performed using the standard protocol following bolus administration of intravenous  contrast. CONTRAST:  ISOVUE-300 IOPAMIDOL (ISOVUE-300) INJECTION 61% COMPARISON:  Abdominal CT 11/19/2016. Lumbar spine radiograph 05/09/2018. FINDINGS: Lower chest: Distal esophageal Elliott thickening. Minimal subpleural scarring in the right lower lobe. No pleural fluid. Hepatobiliary: Cirrhotic hepatic morphology with nodular contours. Diffusely heterogeneous hepatic parenchyma without evidence of focal mass. Gallbladder physiologically distended, no calcified stone. No biliary dilatation. Pancreas: No ductal dilatation or inflammation. Spleen: Splenomegaly with spleen measuring 21.1 cm cranial caudal. No focal abnormality.  Adrenals/Urinary Tract: No adrenal nodule. No hydronephrosis or perinephric edema. Homogeneous renal enhancement with symmetric excretion on delayed phase imaging. Urinary bladder is only minimally distended and not well evaluated. Stomach/Bowel: Bowel evaluation is limited in the absence of enteric contrast and paucity of intra-abdominal fat. There is Keelin thickening of the distal esophagus. Equivocal gastric Mignone thickening versus nondistention. No small bowel dilatation or obstruction. Small to moderate volume of stool throughout the colon. Appendix tentatively identified, no evidence of appendicitis. Vascular/Lymphatic: Aortic atherosclerosis without aneurysm. Dilatation of the main portal vein at 2 cm, no visualized portal vein thrombus. Retroaortic left renal vein. Multiple prominent retroperitoneal nodes are likely reactive. Prominent portacaval node measuring 17 mm is nonspecific. Reproductive: Prostate is unremarkable. Other: Small volume of perihepatic ascites. Generalized mesenteric edema. No free air. Musculoskeletal: Bony destruction centered at L3-L4 with disc space loss and associated endplate irregularity. Ill-defined perivertebral fluid collection within the right ileus psoas muscle, for example image 46 series 2 consistent with abscess. Posterior extent causes mass effect on the spinal canal. There is irregular widening of the right sacroiliac joint that has progressed from right hip CT 05/11/2018. Endplate irregularity with disc space narrowing at T12-L1 is new from lumbar spine radiographs. IMPRESSION: 1. 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 Additional endplate irregularity at T12-L1 is suspicious for additional sites of discitis/osteomyelitis. Progressive right sacroiliac joint widening and irregularity suspicious for septic joint. Recommend MRI to characterize extent and evaluate for epidural  abscess. 2. Cirrhosis with portal hypertension and splenomegaly. 3. Distal esophageal Mace thickening may be secondary to varices. 4.  Aortic Atherosclerosis (ICD10-I70.0). These results were called by telephone at the time of interpretation on 09/30/2018 at 12:45 am to [facility] , who verbally acknowledged these results. Electronically Signed   By: Narda Rutherford M.D.   On: 09/30/2018 00:47    EKG: Independently reviewed. 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 Maurice Small to see patient, appreciate input -Received vancomycin and Zosyn last night, he is not toxic appearing, not febrile, no leukocytosis, hold further antibiotics to maximize culture results -Pain control -Infectious disease consulted, discussed with Dr. Drue Second, appreciate input -Also obtain a 2D echo, blood cultures obtained in the ER and are pending  Liver cirrhosis/abdominal pain -Underwent a CT scan of the abdomen and pelvis which did not show acute findings but changes consistent with cirrhosis, portal hypertension, splenomegaly and possible esophageal varices. -No evidence of ascites or hepatic encephalopathy or GI bleed at this point.  Continue to closely monitor.  Polysubstance abuse -IV heroin, counseled for cessation  Hep B/C -We will need outpatient follow-up  Thrombocytopenia -Due to liver disease, no bleeding  Normocytic anemia -In the setting of chronic illness  DVT prophylaxis: SCDs  Code Status: Full code Family Communication: No family at bedside Disposition Plan: Admit to MedSurg Consults called: Neurosurgery, infectious disease  Admission status: Inpatient    At the time of admission, it appears that the appropriate admission status for this patient is INPATIENT. This is judged to be reasonable and necessary in order to  provide the required high service intensity to ensure the patient's safety given the presenting symptoms, physical exam findings, and initial radiographic and laboratory data in the context of their chronic comorbidities. Current circumstances are discitis/osteomyelitis and need for prolonged IV antibiotics, and it is felt to place patient at high risk for further clinical deterioration threatening life, limb, or organ. Moreover, it is my clinical judgment that the patient will require inpatient hospital care spanning beyond 2 midnights from the point of admission and that early discharge would result in unnecessary risk of decompensation and readmission or threat to life, limb or bodily function.   Pamella Pert, MD [facility] Pager [phone]  If 7PM-7AM, please contact night-coverage [email or web address] Password [email or web address]  09/30/2018, 8:16 AM

## 2018-09-30 NOTE — Consult Note (Signed)
Chief Complaint: Patient was seen in consultation today for lumbar 3-lumbar 4 discitis.  Referring Physician(s): Veryl Speak  Supervising Physician: Gilmer Mor  Patient Status: [facility] - In-pt  History of Present Illness: Dan [name] is a 47 y.o. male with a past medical history of gastric ulceration with associated upper GI bleed, cirrhosis, hepatitis B, hepatitis C, nephrolithiasis, chronic low back pain, polysubstance abuse, anxiety, and depression. He presented to [facility] ED 09/29/2018 with complaints of back pain, abdominal pain, and hematuria. He underwent CT abdomen/pelvis which was concerning for epidural abscess, and he was transferred to [facility] for admission, further workup, and management.  CT abdomen/pelvis 09/29/2018: 1. 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 Additional endplate irregularity at T12-L1 is suspicious for additional sites of discitis/osteomyelitis. Progressive right sacroiliac joint widening and irregularity suspicious for septic joint. Recommend MRI to characterize extent and evaluate for epidural abscess. 2. Cirrhosis with portal hypertension and splenomegaly. 3. Distal esophageal Gayheart thickening may be secondary to varices. 4.  Aortic Atherosclerosis (ICD10-I70.0).  MRI lumbar spine 09/30/2018: 1. T12-L1 discitis osteomyelitis. Associated inflammation and thickening extending from T11-L1 results in moderate stenosis of the thecal sac. No discrete paravertebral or epidural abscess at this level. 2. L3-4 discitis osteomyelitis with moderate collapse of vertebral bodies and bony retropulsion. Bony retropulsion results in severe spinal canal stenosis. 3. L3-4 disc abscess extending into bilateral psoas muscles. No epidural component. 4. Mild discogenic degenerative changes at additional lumbar spinal levels.  IR requested by Jeanine Luz for possible image-guided  lumbar 3-lumbar 4 disc aspiration. Patient awake and alert laying in bed. Complains of low back pain, rated 8-9/10 at this time. States that he needs more pain medicine, as everything he is receiving "doesn't touch me". Complains of abdominal pain, stable since admission. Denies fever, chills, chest pain, dyspnea, headache, or numbness/tingling down legs.   Past Medical History:  Diagnosis Date  . Anxiety   . Chronic lower back pain   . Chronic pain   . Cirrhosis of liver (HCC)   . Depression   . Gastric ulceration   . Hepatitis B   . Hepatitis C    "never tx'd" (09/30/2018)  . History of kidney stones   . Polysubstance abuse (HCC)   . Upper GI bleed     Past Surgical History:  Procedure Laterality Date  . CYSTOSCOPY W/ STONE MANIPULATION    . ESOPHAGOGASTRODUODENOSCOPY N/A 07/16/2016   Procedure: ESOPHAGOGASTRODUODENOSCOPY (EGD);  Surgeon: Ruffin Frederick, MD;  Location: [facility] ENDOSCOPY;  Service: Gastroenterology;  Laterality: N/A;  . ESOPHAGOGASTRODUODENOSCOPY N/A 10/08/2016   Procedure: ESOPHAGOGASTRODUODENOSCOPY (EGD);  Surgeon: Sherrilyn Rist, MD;  Location: [facility] ENDOSCOPY;  Service: Endoscopy;  Laterality: N/A;  . LUNG LOBECTOMY Right   . TEE WITHOUT CARDIOVERSION N/A 10/30/2016   Procedure: TRANSESOPHAGEAL ECHOCARDIOGRAM (TEE);  Surgeon: Pricilla Riffle, MD;  Location: [facility] ENDOSCOPY;  Service: Cardiovascular;  Laterality: N/A;    Allergies: Patient has no known allergies.  Medications: Prior to Admission medications   Medication Sig Start Date End Date Taking? Authorizing Provider  cephALEXin (KEFLEX) 500 MG capsule Take 1 capsule (500 mg total) by mouth 3 (three) times daily. Patient not taking: Reported on 09/29/2018 05/11/18   Fayrene Helper, PA-C  oxyCODONE-acetaminophen (PERCOCET) 5-325 MG tablet Take 1 tablet by mouth every 6 (six) hours as needed for moderate pain or severe pain. Patient not taking: Reported on 09/29/2018 05/11/18   Fayrene Helper, PA-C  Family  History  Problem Relation Age of Onset  . Dementia Mother     Social History   Socioeconomic History  . Marital status: Single    Spouse name: Not on file  . Number of children: Not on file  . Years of education: Not on file  . Highest education level: Not on file  Occupational History  . Occupation: disabled  Social Needs  . Financial resource strain: Not on file  . Food insecurity:    Worry: Not on file    Inability: Not on file  . Transportation needs:    Medical: Not on file    Non-medical: Not on file  Tobacco Use  . Smoking status: Current Every Day Smoker    Packs/day: 1.00    Years: 36.00    Pack years: 36.00    Types: Cigarettes  . Smokeless tobacco: Never Used  Substance and Sexual Activity  . Alcohol use: No  . Drug use: Yes    Types: Heroin    Comment: 09/30/2018 "qd"  . Sexual activity: Not Currently  Lifestyle  . Physical activity:    Days per week: Not on file    Minutes per session: Not on file  . Stress: Not on file  Relationships  . Social connections:    Talks on phone: Not on file    Gets together: Not on file    Attends religious service: Not on file    Active member of club or organization: Not on file    Attends meetings of clubs or organizations: Not on file    Relationship status: Not on file  Other Topics Concern  . Not on file  Social History Narrative  . Not on file     Review of Systems: A 12 point ROS discussed and pertinent positives are indicated in the HPI above.  All other systems are negative.  Review of Systems  Constitutional: Negative for chills and fever.  Respiratory: Negative for shortness of breath and wheezing.   Cardiovascular: Negative for chest pain and palpitations.  Gastrointestinal: Positive for abdominal pain.  Musculoskeletal: Positive for back pain.  Neurological: Negative for numbness and headaches.  Psychiatric/Behavioral: Negative for behavioral problems and confusion.    Vital Signs: BP 121/88  (BP Location: Right Arm)   Pulse 79   Temp 98.1 F (36.7 C) (Oral)   Resp 16   Ht 5\' 11"  (1.803 m)   Wt 140 lb (63.5 kg)   SpO2 99%   BMI 19.53 kg/m   Physical Exam  Constitutional: He is oriented to person, place, and time. He appears well-developed and well-nourished. No distress.  Cardiovascular: Normal rate, regular rhythm and normal heart sounds.  No murmur heard. Pulmonary/Chest: Effort normal and breath sounds normal. No respiratory distress. He has no wheezes.  Musculoskeletal:  Moderate-severe midline low back pain.  Neurological: He is alert and oriented to person, place, and time.  Skin: Skin is warm and dry.  Psychiatric: He has a normal mood and affect. His behavior is normal. Judgment and thought content normal.  Nursing note and vitals reviewed.    MD Evaluation Airway: WNL Heart: WNL Abdomen: WNL Chest/ Lungs: WNL ASA  Classification: 3 Mallampati/Airway Score: One   Imaging: Dg Chest 2 View  Result Date: 09/29/2018 CLINICAL DATA:  Fever, body aches. EXAM: CHEST - 2 VIEW COMPARISON:  Radiograph 08/21/2017 FINDINGS: Curvilinear lucencies paralleling the right chest Happ consistent with skin folds, lung markings are seen extending to the pleural surface. Similar appearance  in the left mid thorax to a lesser extent. Chronic bronchial thickening and right upper lobe scarring. No acute airspace disease. Minimal blunting of right costophrenic angle, possible trace effusion. No pulmonary edema. No acute osseous abnormalities. IMPRESSION: 1. Chronic bronchitic change in right upper lobe scarring. No acute airspace disease. 2. Curvilinear lucencies paralleling right greater than left chest walls consistent with skin folds. Electronically Signed   By: Narda Rutherford M.D.   On: 09/29/2018 23:15   Ct Head Wo Contrast  Result Date: 09/30/2018 CLINICAL DATA:  Subacute left vision loss, unable to see out of left eye for 1 month EXAM: CT HEAD WITHOUT CONTRAST TECHNIQUE:  Contiguous axial images were obtained from the base of the skull through the vertex without intravenous contrast. COMPARISON:  CT brain 05/30/2013 FINDINGS: Brain: No evidence of acute infarction, hemorrhage, hydrocephalus, extra-axial collection or mass lesion/mass effect. Vascular: No hyperdense vessel or unexpected calcification. Skull: Normal. Negative for fracture or focal lesion. Sinuses/Orbits: No acute finding. Other: None IMPRESSION: Negative.  No CT evidence for acute intracranial abnormality Electronically Signed   By: Jasmine Pang M.D.   On: 09/30/2018 00:33   Mr Lumbar Spine W Wo Contrast  Result Date: 09/30/2018 CLINICAL DATA:  47 y/o  M; abdominal pain, back pain, hematuria. EXAM: MRI LUMBAR SPINE WITHOUT AND WITH CONTRAST TECHNIQUE: Multiplanar and multiecho pulse sequences of the lumbar spine were obtained without and with intravenous contrast. CONTRAST:  6.3 cc Gadavist COMPARISON:  09/30/2018 CT abdomen and pelvis. 05/09/2018 lumbar spine radiographs. 09/19/2011 lumbar spine MRI. FINDINGS: Segmentation:  Standard. Alignment:  Physiologic. Vertebrae: T12-L1 discitis osteomyelitis. Associated anterior and lateral epidural enhancement extending from mid T11 to lower L1 with moderate stenosis of the thecal sac. Additionally, there is paravertebral soft tissue inflammation extending into the bilateral T12-L1 neural foramen. No discrete epidural abscess or paravertebral abscess at these levels. L3-4 discitis osteomyelitis with destructive changes of the opposing endplates, mild-to-moderate loss of vertebral body height, and disc abscess extending into the psoas muscle insertions bilaterally. The right-sided psoas abscess measures 3.8 x 2.1 cm (series 12, image 23) and the left-sided psoas abscess measures 3.0 x 1.7 cm. Paravertebral soft tissue inflammation extends into the bilateral L3-4 neural foramen which are effaced. Additionally, collapse and retropulsion of the posterior aspects of the L3  and L4 vertebral bodies results in severe spinal canal stenosis. No epidural abscess at this level. No additional level of discitis osteomyelitis identified. Conus medullaris and cauda equina: Conus extends to the L1 level. Conus and cauda equina appear normal. No abnormal enhancement. Paraspinal and other soft tissues: As above. Cholelithiasis. Splenomegaly. Disc levels: T12-L1: Epidural soft tissue thickening results in moderate stenosis of the thecal sac. Paravertebral soft tissue inflammation extends into the neural foramen bilaterally. L1-2: Mild disc bulge and facet hypertrophy. No significant foraminal or canal stenosis. L2-3: Mild disc bulge and facet hypertrophy. No significant foraminal or canal stenosis. L3-4: Collapse and retropulsion of the posterior aspects of L3 and L4 vertebral bodies combined with facet hypertrophy. Moderate bilateral foraminal stenosis and severe canal stenosis. L4-5: Moderate disc bulge and mild facet hypertrophy. Mild bilateral foraminal stenosis and moderate canal stenosis. L5-S1: Mild disc bulge with small central protrusion, central annular fissure, and endplate marginal osteophytes. Mild bilateral foraminal stenosis. No significant canal stenosis. IMPRESSION: 1. T12-L1 discitis osteomyelitis. Associated inflammation and thickening extending from T11-L1 results in moderate stenosis of the thecal sac. No discrete paravertebral or epidural abscess at this level. 2. L3-4 discitis osteomyelitis with moderate collapse of vertebral  bodies and bony retropulsion. Bony retropulsion results in severe spinal canal stenosis. 3. L3-4 disc abscess extending into bilateral psoas muscles. No epidural component. 4. Mild discogenic degenerative changes at additional lumbar spinal levels. Electronically Signed   By: Mitzi Hansen M.D.   On: 09/30/2018 05:32   Ct Abdomen Pelvis W Contrast  Result Date: 09/30/2018 CLINICAL DATA:  Abd pain, acute, generalized. Patient reports  bilateral leg pain and abdominal distension. Patient reports low back pain for "forever". EXAM: CT ABDOMEN AND PELVIS WITH CONTRAST TECHNIQUE: Multidetector CT imaging of the abdomen and pelvis was performed using the standard protocol following bolus administration of intravenous contrast. CONTRAST:  ISOVUE-300 IOPAMIDOL (ISOVUE-300) INJECTION 61% COMPARISON:  Abdominal CT 11/19/2016. Lumbar spine radiograph 05/09/2018. FINDINGS: Lower chest: Distal esophageal Woodrow thickening. Minimal subpleural scarring in the right lower lobe. No pleural fluid. Hepatobiliary: Cirrhotic hepatic morphology with nodular contours. Diffusely heterogeneous hepatic parenchyma without evidence of focal mass. Gallbladder physiologically distended, no calcified stone. No biliary dilatation. Pancreas: No ductal dilatation or inflammation. Spleen: Splenomegaly with spleen measuring 21.1 cm cranial caudal. No focal abnormality. Adrenals/Urinary Tract: No adrenal nodule. No hydronephrosis or perinephric edema. Homogeneous renal enhancement with symmetric excretion on delayed phase imaging. Urinary bladder is only minimally distended and not well evaluated. Stomach/Bowel: Bowel evaluation is limited in the absence of enteric contrast and paucity of intra-abdominal fat. There is Fleury thickening of the distal esophagus. Equivocal gastric Harold thickening versus nondistention. No small bowel dilatation or obstruction. Small to moderate volume of stool throughout the colon. Appendix tentatively identified, no evidence of appendicitis. Vascular/Lymphatic: Aortic atherosclerosis without aneurysm. Dilatation of the main portal vein at 2 cm, no visualized portal vein thrombus. Retroaortic left renal vein. Multiple prominent retroperitoneal nodes are likely reactive. Prominent portacaval node measuring 17 mm is nonspecific. Reproductive: Prostate is unremarkable. Other: Small volume of perihepatic ascites. Generalized mesenteric edema. No free air.  Musculoskeletal: Bony destruction centered at L3-L4 with disc space loss and associated endplate irregularity. Ill-defined perivertebral fluid collection within the right ileus psoas muscle, for example image 46 series 2 consistent with abscess. Posterior extent causes mass effect on the spinal canal. There is irregular widening of the right sacroiliac joint that has progressed from right hip CT 05/11/2018. Endplate irregularity with disc space narrowing at T12-L1 is new from lumbar spine radiographs. IMPRESSION: 1. 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 Additional endplate irregularity at T12-L1 is suspicious for additional sites of discitis/osteomyelitis. Progressive right sacroiliac joint widening and irregularity suspicious for septic joint. Recommend MRI to characterize extent and evaluate for epidural abscess. 2. Cirrhosis with portal hypertension and splenomegaly. 3. Distal esophageal Phebus thickening may be secondary to varices. 4.  Aortic Atherosclerosis (ICD10-I70.0). These results were called by telephone at the time of interpretation on 09/30/2018 at 12:45 am to [facility] , who verbally acknowledged these results. Electronically Signed   By: Narda Rutherford M.D.   On: 09/30/2018 00:47    Labs:  CBC: Recent Labs    05/11/18 1110 05/11/18 1117 09/29/18 2235  WBC 8.2  --  5.5  HGB 11.6* 11.6* 10.5*  HCT 35.2* 34.0* 34.7*  PLT 102*  --  100*    COAGS: No results for input(s): INR, APTT in the last 8760 hours.  BMP: Recent Labs    05/11/18 1117 09/29/18 2235  NA 134* 137  K 4.3 3.9  CL 98* 102  CO2  --  29  GLUCOSE  187* 105*  BUN 19 19  CALCIUM  --  8.0*  CREATININE 0.60* 0.67  GFRNONAA  --  >60  GFRAA  --  >60    LIVER FUNCTION TESTS: Recent Labs    09/29/18 2235  BILITOT 0.4  AST 18  ALT 14  ALKPHOS 137*  PROT 7.3  ALBUMIN 2.2*    TUMOR MARKERS: No  results for input(s): AFPTM, CEA, CA199, CHROMGRNA in the last 8760 hours.  Assessment and Plan:  Lumbar 3-lumbar 4 discitis. Plan for image-guided lumbar 3-lumbar 4 disc aspiration tentatively for 10/01/2018 with Dr. Loreta Ave. Patient will be NPO at midnight. Afebrile. He does not take blood thinners. INR pending.  Risks and benefits discussed with the patient including, but not limited to bleeding, infection, damage to adjacent structures or low yield requiring additional tests. All of the patient's questions were answered, patient is agreeable to proceed. Consent signed and in chart.   Thank you for this interesting consult.  I greatly enjoyed meeting RUSTYN CONERY and look forward to participating in their care.  A copy of this report was sent to the requesting provider on this date.  Electronically Signed: Elwin Mocha, PA-C 09/30/2018, 3:41 PM   I spent a total of 20 Minutes in face to face in clinical consultation, greater than 50% of which was counseling/coordinating care for lumbar 3-lumbar 5 discitis.

## 2018-09-30 NOTE — ED Notes (Signed)
Pt arrived [facility] from [facility] for MRI to r/o vertebral abscess. Sleeping on arrival but when awake C/o 10/10 back pain. Vancomycin infusing.

## 2018-09-30 NOTE — Care Management (Signed)
This is a no charge note  Pending admission per PA, Upstill  47 year old male with past medical history of polysubstance abuse, IV drug use, hepatitis C, hepatitis B, upper GI bleeding, liver cirrhosis, who presents with several weeks of back pain and abdominal pain.  MRI showed multilevel discitis and osteomyelitis.  Patient was given 1 dose of vancomycin and Zosyn by ED physician.  ED physician consulted nurse practitioner for neurosurgeon, they will consult on this patient.  Patient is admitted to MedSurg bed as inpatient.   Lorretta Harp, MD  [facility] Pager [phone]  If 7PM-7AM, please contact night-coverage [email or web address] Password [facility] 09/30/2018, 6:41 AM

## 2018-09-30 NOTE — Consult Note (Signed)
[facility]  Total days of antibiotics 1               Reason for Consult:discitis    Referring Physician: gherghe  Active Problems:   Discitis of lumbar region   Discitis    HPI: Dan [name] is a 47 y.o. male with history of IVDU, quit in 2016, chronic hepatitis C GT1a without hepatic coma, untreated, and chronic hepatitis B  Dx in 2017 c/b  decompensated cirrhosis with ascites requiring LVP and UGIB due to large gastric ulcer and portal hypertensive gastropathy, hx of fungemia in 2017. He reports having worsening back pain x 4 wks but in the last 2 wks he has been nearly bedbound, incapacitated by back pain. He also in the last 2-3 days started to have subjective fevers, nightsweats. He came to the ED on 10/17 and found to be afebrile, no leukocytosis but imaging of spine concerning for thoraco-lumbar discitis, multi-level with psoas abscess. He was admitted for management. He denies having discitis in the past, no loss of bladder or bowel prior to admit, no neuropathy or weakness, mostly pain.  Past Medical History:  Diagnosis Date  . Anxiety   . Chronic lower back pain   . Chronic pain   . Cirrhosis of liver ([place])   . Depression   . Gastric ulceration   . Hepatitis B   . Hepatitis C    "never tx'd" (09/30/2018)  . History of kidney stones   . Polysubstance abuse ([place])   . Upper GI bleed     Allergies: No Known Allergies   MEDICATIONS: . feeding supplement (ENSURE ENLIVE)  237 mL Oral BID BM    Social History   Tobacco Use  . Smoking status: Current Every Day Smoker    Packs/day: 1.00    Years: 36.00    Pack years: 36.00    Types: Cigarettes  . Smokeless tobacco: Never Used  Substance Use Topics  . Alcohol use: No  . Drug use: Yes    Types: Heroin    Comment: 09/30/2018 "qd"    Family History  Problem Relation Age of Onset  . Dementia Mother      Review of Systems  Constitutional: Negative for fever, chills, diaphoresis,  activity change, appetite change, fatigue and unexpected weight change.  HENT: Negative for congestion, sore throat, rhinorrhea, sneezing, trouble swallowing and sinus pressure.  Eyes: Negative for photophobia and visual disturbance.  Respiratory: Negative for cough, chest tightness, shortness of breath, wheezing and stridor.  Cardiovascular: Negative for chest pain, palpitations and leg swelling.  Gastrointestinal: Negative for nausea, vomiting, abdominal pain, diarrhea, constipation, blood in stool, abdominal distention and anal bleeding.  Genitourinary: Negative for dysuria, hematuria, flank pain and difficulty urinating.  Musculoskeletal: + back pain Skin: Negative for color change, pallor, rash and wound.  Neurological: Negative for dizziness, tremors, weakness and light-headedness.  Hematological: Negative for adenopathy. Does not bruise/bleed easily.  Psychiatric/Behavioral: Negative for behavioral problems, confusion, sleep disturbance, dysphoric mood, decreased concentration and agitation.     OBJECTIVE: Temp:  [97.8 F (36.6 C)-99.5 F (37.5 C)] 98.6 F (37 C) (10/18 1549) Pulse Rate:  [55-114] 78 (10/18 1549) Resp:  [12-20] 16 (10/18 0601) BP: (109-153)/(80-106) 128/87 (10/18 1549) SpO2:  [91 %-100 %] 98 % (10/18 1549) Weight:  [63.5 kg] 63.5 kg (10/17 1828) Physical Exam  Constitutional: He is oriented to person, place, and time. He appears thin and well-nourished, older than stated age, slightly disheveled. No  distress.  HENT:  Mouth/Throat: Oropharynx is clear and moist. No oropharyngeal exudate.  Cardiovascular: Normal rate, regular rhythm and normal heart sounds. Exam reveals no gallop and no friction rub.  No murmur heard.  Pulmonary/Chest: Effort normal and breath sounds normal. No respiratory distress. He has no wheezes.  Abdominal: Soft. Bowel sounds are normal. He exhibits no distension. There is no tenderness.  Lymphadenopathy:  He has no cervical adenopathy.    Neurological: He is alert and oriented to person, place, and time.  Skin: Skin is warm and dry.stigmata of liver disease to chest Psychiatric: He has a normal mood and affect. His behavior is normal.     LABS: Results for orders placed or performed during the hospital encounter of 09/29/18 (from the past 48 hour(s))  Lipase, blood     Status: None   Collection Time: 09/29/18 10:35 PM  Result Value Ref Range   Lipase 42 11 - 51 U/L    Comment: Performed at [facility], [place] [address] [place], [place] [address]  Comprehensive metabolic panel     Status: Abnormal   Collection Time: 09/29/18 10:35 PM  Result Value Ref Range   Sodium 137 135 - 145 mmol/L   Potassium 3.9 3.5 - 5.1 mmol/L   Chloride 102 98 - 111 mmol/L   CO2 29 22 - 32 mmol/L   Glucose, Bld 105 (H) 70 - 99 mg/dL   BUN 19 6 - 20 mg/dL   Creatinine, Ser 0.67 0.61 - 1.24 mg/dL   Calcium 8.0 (L) 8.9 - 10.3 mg/dL   Total Protein 7.3 6.5 - 8.1 g/dL   Albumin 2.2 (L) 3.5 - 5.0 g/dL   AST 18 15 - 41 U/L   ALT 14 0 - 44 U/L   Alkaline Phosphatase 137 (H) 38 - 126 U/L   Total Bilirubin 0.4 0.3 - 1.2 mg/dL   GFR calc non Af Amer >60 >60 mL/min   GFR calc Af Amer >60 >60 mL/min    Comment: (NOTE) The eGFR has been calculated using the CKD EPI equation. This calculation has not been validated in all clinical situations. eGFR's persistently <60 mL/min signify possible Chronic Kidney Disease.    Anion gap 6 5 - 15    Comment: Performed at [facility], [place] [address] [place], [place] [address]  CBC     Status: Abnormal   Collection Time: 09/29/18 10:35 PM  Result Value Ref Range   WBC 5.5 4.0 - 10.5 K/uL   RBC 4.09 (L) 4.22 - 5.81 MIL/uL   Hemoglobin 10.5 (L) 13.0 - 17.0 g/dL   HCT 34.7 (L) 39.0 - 52.0 %   MCV 84.8 80.0 - 100.0 fL   MCH 25.7 (L) 26.0 - 34.0 pg   MCHC 30.3 30.0 - 36.0 g/dL   RDW 15.6 (H) 11.5 - 15.5 %   Platelets 100 (L) 150 - 400 K/uL    Comment: REPEATED TO  VERIFY PLATELET COUNT CONFIRMED BY SMEAR SPECIMEN CHECKED FOR CLOTS Immature Platelet Fraction may be clinically indicated, consider ordering this additional test [id number]    nRBC 0.0 0.0 - 0.2 %    Comment: Performed at [facility], [place] [address] [place], [place] [address]  I-Stat CG4 Lactic Acid, ED  (not at  [facility])     Status: None   Collection Time: 09/29/18 11:29 PM  Result Value Ref Range   Lactic Acid, Venous 1.19 0.5 - 1.9 mmol/L  Urinalysis, Routine w reflex microscopic  Status: Abnormal   Collection Time: 09/29/18 11:32 PM  Result Value Ref Range   Color, Urine AMBER (A) YELLOW    Comment: BIOCHEMICALS MAY BE AFFECTED BY COLOR   APPearance HAZY (A) CLEAR   Specific Gravity, Urine 1.021 1.005 - 1.030   pH 7.0 5.0 - 8.0   Glucose, UA NEGATIVE NEGATIVE mg/dL   Hgb urine dipstick NEGATIVE NEGATIVE   Bilirubin Urine NEGATIVE NEGATIVE   Ketones, ur NEGATIVE NEGATIVE mg/dL   Protein, ur NEGATIVE NEGATIVE mg/dL   Nitrite NEGATIVE NEGATIVE   Leukocytes, UA NEGATIVE NEGATIVE    Comment: Performed at [facility], [place] [address], [place], [place] [address]  Blood Culture (routine x 2)     Status: None (Preliminary result)   Collection Time: 09/29/18 11:32 PM  Result Value Ref Range   Specimen Description      BLOOD LEFT HAND Performed at [facility], [place] [address] [place], [place] [address]    Special Requests      BOTTLES DRAWN AEROBIC AND ANAEROBIC Blood Culture results may not be optimal due to an inadequate volume of blood received in culture bottles Performed at [facility], [place] [address], [place], [place] [address]    Culture PENDING    Report Status PENDING   Blood Culture (routine x 2)     Status: None (Preliminary result)   Collection Time: 09/29/18 11:32 PM  Result Value Ref Range   Specimen Description      BLOOD RIGHT FOREARM Performed at [facility], [place] [address],  [place], [place] [address]    Special Requests      BOTTLES DRAWN AEROBIC AND ANAEROBIC Blood Culture results may not be optimal due to an inadequate volume of blood received in culture bottles Performed at [facility], [place] [address], [place], [place] [address]    Culture PENDING    Report Status PENDING   I-Stat CG4 Lactic Acid, ED  (not at  [facility])     Status: None   Collection Time: 09/30/18  1:07 AM  Result Value Ref Range   Lactic Acid, Venous 0.76 0.5 - 1.9 mmol/L  HIV antibody (Routine Testing)     Status: None   Collection Time: 09/30/18  8:32 AM  Result Value Ref Range   HIV Screen 4th Generation wRfx Non Reactive Non Reactive    Comment: (NOTE) Performed At: [facility] [place], [place] [id number] Rush Farmer MD DH:[id number]     MICRO: pending IMAGING: Dg Chest 2 View  Result Date: 09/29/2018 CLINICAL DATA:  Fever, body aches. EXAM: CHEST - 2 VIEW COMPARISON:  Radiograph 08/21/2017 FINDINGS: Curvilinear lucencies paralleling the right chest Meisinger consistent with skin folds, lung markings are seen extending to the pleural surface. Similar appearance in the left mid thorax to a lesser extent. Chronic bronchial thickening and right upper lobe scarring. No acute airspace disease. Minimal blunting of right costophrenic angle, possible trace effusion. No pulmonary edema. No acute osseous abnormalities. IMPRESSION: 1. Chronic bronchitic change in right upper lobe scarring. No acute airspace disease. 2. Curvilinear lucencies paralleling right greater than left chest walls consistent with skin folds. Electronically Signed   By: Keith Rake M.D.   On: 09/29/2018 23:15   Ct Head Wo Contrast  Result Date: 09/30/2018 CLINICAL DATA:  Subacute left vision loss, unable to see out of left eye for 1 month EXAM: CT HEAD WITHOUT CONTRAST TECHNIQUE: Contiguous axial images were obtained from  the base of the skull through the vertex without  intravenous contrast. COMPARISON:  CT brain 05/30/2013 FINDINGS: Brain: No evidence of acute infarction, hemorrhage, hydrocephalus, extra-axial collection or mass lesion/mass effect. Vascular: No hyperdense vessel or unexpected calcification. Skull: Normal. Negative for fracture or focal lesion. Sinuses/Orbits: No acute finding. Other: None IMPRESSION: Negative.  No CT evidence for acute intracranial abnormality Electronically Signed   By: Donavan Foil M.D.   On: 09/30/2018 00:33   Mr Lumbar Spine W Wo Contrast  Result Date: 09/30/2018 CLINICAL DATA:  47 y/o  M; abdominal pain, back pain, hematuria. EXAM: MRI LUMBAR SPINE WITHOUT AND WITH CONTRAST TECHNIQUE: Multiplanar and multiecho pulse sequences of the lumbar spine were obtained without and with intravenous contrast. CONTRAST:  6.3 cc Gadavist COMPARISON:  09/30/2018 CT abdomen and pelvis. 05/09/2018 lumbar spine radiographs. 09/19/2011 lumbar spine MRI. FINDINGS: Segmentation:  Standard. Alignment:  Physiologic. Vertebrae: T12-L1 discitis osteomyelitis. Associated anterior and lateral epidural enhancement extending from mid T11 to lower L1 with moderate stenosis of the thecal sac. Additionally, there is paravertebral soft tissue inflammation extending into the bilateral T12-L1 neural foramen. No discrete epidural abscess or paravertebral abscess at these levels. L3-4 discitis osteomyelitis with destructive changes of the opposing endplates, mild-to-moderate loss of vertebral body height, and disc abscess extending into the psoas muscle insertions bilaterally. The right-sided psoas abscess measures 3.8 x 2.1 cm (series 12, image 23) and the left-sided psoas abscess measures 3.0 x 1.7 cm. Paravertebral soft tissue inflammation extends into the bilateral L3-4 neural foramen which are effaced. Additionally, collapse and retropulsion of the posterior aspects of the L3 and L4 vertebral bodies results in severe spinal canal stenosis. No epidural abscess at this  level. No additional level of discitis osteomyelitis identified. Conus medullaris and cauda equina: Conus extends to the L1 level. Conus and cauda equina appear normal. No abnormal enhancement. Paraspinal and other soft tissues: As above. Cholelithiasis. Splenomegaly. Disc levels: T12-L1: Epidural soft tissue thickening results in moderate stenosis of the thecal sac. Paravertebral soft tissue inflammation extends into the neural foramen bilaterally. L1-2: Mild disc bulge and facet hypertrophy. No significant foraminal or canal stenosis. L2-3: Mild disc bulge and facet hypertrophy. No significant foraminal or canal stenosis. L3-4: Collapse and retropulsion of the posterior aspects of L3 and L4 vertebral bodies combined with facet hypertrophy. Moderate bilateral foraminal stenosis and severe canal stenosis. L4-5: Moderate disc bulge and mild facet hypertrophy. Mild bilateral foraminal stenosis and moderate canal stenosis. L5-S1: Mild disc bulge with small central protrusion, central annular fissure, and endplate marginal osteophytes. Mild bilateral foraminal stenosis. No significant canal stenosis. IMPRESSION: 1. T12-L1 discitis osteomyelitis. Associated inflammation and thickening extending from T11-L1 results in moderate stenosis of the thecal sac. No discrete paravertebral or epidural abscess at this level. 2. L3-4 discitis osteomyelitis with moderate collapse of vertebral bodies and bony retropulsion. Bony retropulsion results in severe spinal canal stenosis. 3. L3-4 disc abscess extending into bilateral psoas muscles. No epidural component. 4. Mild discogenic degenerative changes at additional lumbar spinal levels. Electronically Signed   By: Kristine Garbe M.D.   On: 09/30/2018 05:32   Ct Abdomen Pelvis W Contrast  Result Date: 09/30/2018 CLINICAL DATA:  Abd pain, acute, generalized. Patient reports bilateral leg pain and abdominal distension. Patient reports low back pain for "forever". EXAM: CT  ABDOMEN AND PELVIS WITH CONTRAST TECHNIQUE: Multidetector CT imaging of the abdomen and pelvis was performed using the standard protocol following bolus administration of intravenous contrast. CONTRAST:  151m ISOVUE-300 IOPAMIDOL (ISOVUE-300) INJECTION 61%  COMPARISON:  Abdominal CT 11/19/2016. Lumbar spine radiograph 05/09/2018. FINDINGS: Lower chest: Distal esophageal Rennaker thickening. Minimal subpleural scarring in the right lower lobe. No pleural fluid. Hepatobiliary: Cirrhotic hepatic morphology with nodular contours. Diffusely heterogeneous hepatic parenchyma without evidence of focal mass. Gallbladder physiologically distended, no calcified stone. No biliary dilatation. Pancreas: No ductal dilatation or inflammation. Spleen: Splenomegaly with spleen measuring 21.1 cm cranial caudal. No focal abnormality. Adrenals/Urinary Tract: No adrenal nodule. No hydronephrosis or perinephric edema. Homogeneous renal enhancement with symmetric excretion on delayed phase imaging. Urinary bladder is only minimally distended and not well evaluated. Stomach/Bowel: Bowel evaluation is limited in the absence of enteric contrast and paucity of intra-abdominal fat. There is [place] thickening of the distal esophagus. Equivocal gastric [place] thickening versus nondistention. No small bowel dilatation or obstruction. Small to moderate volume of stool throughout the colon. Appendix tentatively identified, no evidence of appendicitis. Vascular/Lymphatic: Aortic atherosclerosis without aneurysm. Dilatation of the main portal vein at 2 cm, no visualized portal vein thrombus. Retroaortic left renal vein. Multiple prominent retroperitoneal nodes are likely reactive. Prominent portacaval node measuring 17 mm is nonspecific. Reproductive: Prostate is unremarkable. Other: Small volume of perihepatic ascites. Generalized mesenteric edema. No free air. Musculoskeletal: Bony destruction centered at L3-L4 with disc space loss and associated endplate  irregularity. Ill-defined perivertebral fluid collection within the right ileus psoas muscle, for example image 46 series 2 consistent with abscess. Posterior extent causes mass effect on the spinal canal. There is irregular widening of the right sacroiliac joint that has progressed from right hip CT 05/11/2018. Endplate irregularity with disc space narrowing at T12-L1 is new from lumbar spine radiographs. IMPRESSION: 1. 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 Additional endplate irregularity at T12-L1 is suspicious for additional sites of discitis/osteomyelitis. Progressive right sacroiliac joint widening and irregularity suspicious for septic joint. Recommend MRI to characterize extent and evaluate for epidural abscess. 2. Cirrhosis with portal hypertension and splenomegaly. 3. Distal esophageal Andrzejewski thickening may be secondary to varices. 4.  Aortic Atherosclerosis (ICD10-I70.0). These results were called by telephone at the time of interpretation on 09/30/2018 at 12:45 am to [place] , who verbally acknowledged these results. Electronically Signed   By: Keith Rake M.D.   On: 09/30/2018 00:47   Assessment/Plan: 47yo M with liver cirrhosis related to hep B-hep C admitted for thoraco-lumbar discitis  - will have IR aspirate disc or psoas abscess to help with narrowing abtx - once aspiration is completed please start vancomycin and ceftriaxone - if blood cx become positive - would need further work up for endocarditis  - please check sed rate and crp - for health maintenance = would check hiv   DR comer to see over the weekend.

## 2018-09-30 NOTE — ED Provider Notes (Signed)
Patient transferred from [facility] ED for MRI lumbar spine after abnormal CT scan of abd/pel raise concern for epidural abscess. Patient with a history of IV heroin, hep B, hep C, cirrhosis, candidemia, PUD, thrombocytopenia presented to [facility] ED with complaint of generalized, constant abdominal pain without N, V; fever; midline back pain x 3-4 weeks. He reports hematuria and dysuria for 2 weeks. Vision loss in the left eye. Weight loss of 100 lbs in 8 months.  MRI lumbar spine obtained and shows: IMPRESSION: 1. T12-L1 discitis osteomyelitis. Associated inflammation and thickening extending from T11-L1 results in moderate stenosis of the thecal sac. No discrete paravertebral or epidural abscess at this level. 2. L3-4 discitis osteomyelitis with moderate collapse of vertebral bodies and bony retropulsion. Bony retropulsion results in severe spinal canal stenosis. 3. L3-4 disc abscess extending into bilateral psoas muscles. No epidural component. 4. Mild discogenic degenerative changes at additional lumbar spinal Levels.  Physical Exam  BP 125/86 (BP Location: Right Arm)   Pulse (!) 114   Temp 97.8 F (36.6 C) (Oral)   Resp 17   Ht 5\' 11"  (1.803 m)   Wt 63.5 kg   SpO2 92%   BMI 19.53 kg/m   Physical Exam   Sleeping soundly on multiple rechecks  When awake, the patient is A&O, coherent, rational.  He is cachectic in appearance Abdomen is soft, nondistended, diffusely tender.  Extremities: moves all extremities without strength deficits  ED Course/Procedures      Procedures  MDM  The patient complains of pain while awake and identifies back pain as the source of the greatest pain. He also endorses chronic back pain.   Neurosurgery paged with MR results. Anticipate medical admission with neurosurg consultation.  Discussed with Verlin Dike, NP, neurosurgery, who states her service will provide consultation later this morning. Discussed with Dr. Clyde Lundborg, [facility], who accepts the  patient for admission.  Patient updated on MR results and plan to admit.           Elpidio Anis, PA-C 09/30/18 5621    Nira Conn, MD 10/01/18 0530

## 2018-09-30 NOTE — Consult Note (Signed)
Reason for Consult: Osteomyelitis and discitis Referring Physician:EDP  MILFRED KRAMMES is an 47 y.o. male.   HPI:  47 year old patient presented to the ED last night after experiencing excruciating back pain for 3 weeks.  He denies any numbness tingling or pain  down his legs.  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  His pain is attended 10, constant, and achy.  He states that he has been using heroin to manage his pain.  He has been using IV heroin for 15 years now.  denies any fevers or chills at home.  Apparently has lost 100 pounds over the last 8 months.  Denies any changes bowel bladder habits.  Past Medical History:  Diagnosis Date  . Chronic pain   . Cirrhosis of liver (HCC)   . Gastric ulceration   . Hepatitis B   . Hepatitis C   . Polysubstance abuse (HCC)   . Upper GI bleed     Past Surgical History:  Procedure Laterality Date  . ESOPHAGOGASTRODUODENOSCOPY N/A 07/16/2016   Procedure: ESOPHAGOGASTRODUODENOSCOPY (EGD);  Surgeon: Ruffin Frederick, MD;  Location: [facility] ENDOSCOPY;  Service: Gastroenterology;  Laterality: N/A;  . ESOPHAGOGASTRODUODENOSCOPY N/A 10/08/2016   Procedure: ESOPHAGOGASTRODUODENOSCOPY (EGD);  Surgeon: Sherrilyn Rist, MD;  Location: [facility] ENDOSCOPY;  Service: Endoscopy;  Laterality: N/A;  . LUNG SURGERY     right lobectomy  . TEE WITHOUT CARDIOVERSION N/A 10/30/2016   Procedure: TRANSESOPHAGEAL ECHOCARDIOGRAM (TEE);  Surgeon: Pricilla Riffle, MD;  Location: [facility] ENDOSCOPY;  Service: Cardiovascular;  Laterality: N/A;    No Known Allergies  Social History   Tobacco Use  . Smoking status: Current Every Day Smoker    Packs/day: 1.00    Types: Cigarettes  . Smokeless tobacco: Never Used  Substance Use Topics  . Alcohol use: No    Family History  Problem Relation Age of Onset  . Dementia Mother      Review of Systems  Positive ROS: Above  All other systems have been reviewed and were otherwise negative with the  exception of those mentioned in the HPI and as above.  Objective: Vital signs in last 24 hours: Temp:  [97.8 F (36.6 C)-99.5 F (37.5 C)] 98.1 F (36.7 C) (10/18 1023) Pulse Rate:  [55-114] 79 (10/18 1023) Resp:  [12-20] 16 (10/18 0601) BP: (109-153)/(80-106) 121/88 (10/18 1023) SpO2:  [91 %-100 %] 99 % (10/18 1023) Weight:  [63.5 kg] 63.5 kg (10/17 1828)  General Appearance: Alert, cooperative, no distress, appears stated age Head: Normocephalic, without obvious abnormality, atraumatic Eyes: PERRL, conjunctiva/corneas clear, EOM's intact, fundi benign, both eyes      Back: Symmetric, no curvature, ROM normal, no CVA tenderness Lungs:  respirations unlabored Heart: Regular rate and rhythm Extremities: Extremities normal, atraumatic, no cyanosis or edema Pulses: 2+ and symmetric all extremities Skin: Skin color, texture, turgor normal, no rashes or lesions  NEUROLOGIC:   Mental status: A&O x4, no aphasia, good attention span, Memory and fund of knowledge Motor Exam - grossly normal, normal tone and bulk strength 5 out of 5 all extremities Sensory Exam - grossly normal Reflexes: symmetric, no pathologic reflexes, No Hoffman's, No clonus Coordination - grossly normal Gait -not tested Balance -not tested Cranial Nerves: I: smell Not tested  II: visual acuity  OS: na  OD: na  II: visual fields Full to confrontation  II: pupils Equal, round, reactive to light  III,VII: ptosis None  III,IV,VI: extraocular muscles  Full ROM  V: mastication   V: facial light touch sensation    V,VII: corneal reflex    VII: facial muscle function - upper    VII: facial muscle function - lower   VIII: hearing   IX: soft palate elevation    IX,X: gag reflex   XI: trapezius strength    XI: sternocleidomastoid strength   XI: neck flexion strength    XII: tongue strength      Data Review Lab Results  Component Value Date   WBC 5.5 09/29/2018   HGB 10.5 (L) 09/29/2018   HCT 34.7 (L)  09/29/2018   MCV 84.8 09/29/2018   PLT 100 (L) 09/29/2018   Lab Results  Component Value Date   NA 137 09/29/2018   K 3.9 09/29/2018   CL 102 09/29/2018   CO2 29 09/29/2018   BUN 19 09/29/2018   CREATININE 0.67 09/29/2018   GLUCOSE 105 (H) 09/29/2018   Lab Results  Component Value Date   INR 1.26 10/30/2016    Radiology: Dg Chest 2 View  Result Date: 09/29/2018 CLINICAL DATA:  Fever, body aches. EXAM: CHEST - 2 VIEW COMPARISON:  Radiograph 08/21/2017 FINDINGS: Curvilinear lucencies paralleling the right chest Nole consistent with skin folds, lung markings are seen extending to the pleural surface. Similar appearance in the left mid thorax to a lesser extent. Chronic bronchial thickening and right upper lobe scarring. No acute airspace disease. Minimal blunting of right costophrenic angle, possible trace effusion. No pulmonary edema. No acute osseous abnormalities. IMPRESSION: 1. Chronic bronchitic change in right upper lobe scarring. No acute airspace disease. 2. Curvilinear lucencies paralleling right greater than left chest walls consistent with skin folds. Electronically Signed   By: Narda Rutherford M.D.   On: 09/29/2018 23:15   Ct Head Wo Contrast  Result Date: 09/30/2018 CLINICAL DATA:  Subacute left vision loss, unable to see out of left eye for 1 month EXAM: CT HEAD WITHOUT CONTRAST TECHNIQUE: Contiguous axial images were obtained from the base of the skull through the vertex without intravenous contrast. COMPARISON:  CT brain 05/30/2013 FINDINGS: Brain: No evidence of acute infarction, hemorrhage, hydrocephalus, extra-axial collection or mass lesion/mass effect. Vascular: No hyperdense vessel or unexpected calcification. Skull: Normal. Negative for fracture or focal lesion. Sinuses/Orbits: No acute finding. Other: None IMPRESSION: Negative.  No CT evidence for acute intracranial abnormality Electronically Signed   By: Jasmine Pang M.D.   On: 09/30/2018 00:33   Mr Lumbar Spine W  Wo Contrast  Result Date: 09/30/2018 CLINICAL DATA:  47 y/o  M; abdominal pain, back pain, hematuria. EXAM: MRI LUMBAR SPINE WITHOUT AND WITH CONTRAST TECHNIQUE: Multiplanar and multiecho pulse sequences of the lumbar spine were obtained without and with intravenous contrast. CONTRAST:  6.3 cc Gadavist COMPARISON:  09/30/2018 CT abdomen and pelvis. 05/09/2018 lumbar spine radiographs. 09/19/2011 lumbar spine MRI. FINDINGS: Segmentation:  Standard. Alignment:  Physiologic. Vertebrae: T12-L1 discitis osteomyelitis. Associated anterior and lateral epidural enhancement extending from mid T11 to lower L1 with moderate stenosis of the thecal sac. Additionally, there is paravertebral soft tissue inflammation extending into the bilateral T12-L1 neural foramen. No discrete epidural abscess or paravertebral abscess at these levels. L3-4 discitis osteomyelitis with destructive changes of the opposing endplates, mild-to-moderate loss of vertebral body height, and disc abscess extending into the psoas muscle insertions bilaterally. The right-sided psoas abscess measures 3.8 x 2.1 cm (series 12, image 23) and the left-sided psoas abscess measures 3.0 x 1.7 cm. Paravertebral soft tissue inflammation extends into the bilateral L3-4 neural foramen  which are effaced. Additionally, collapse and retropulsion of the posterior aspects of the L3 and L4 vertebral bodies results in severe spinal canal stenosis. No epidural abscess at this level. No additional level of discitis osteomyelitis identified. Conus medullaris and cauda equina: Conus extends to the L1 level. Conus and cauda equina appear normal. No abnormal enhancement. Paraspinal and other soft tissues: As above. Cholelithiasis. Splenomegaly. Disc levels: T12-L1: Epidural soft tissue thickening results in moderate stenosis of the thecal sac. Paravertebral soft tissue inflammation extends into the neural foramen bilaterally. L1-2: Mild disc bulge and facet hypertrophy. No  significant foraminal or canal stenosis. L2-3: Mild disc bulge and facet hypertrophy. No significant foraminal or canal stenosis. L3-4: Collapse and retropulsion of the posterior aspects of L3 and L4 vertebral bodies combined with facet hypertrophy. Moderate bilateral foraminal stenosis and severe canal stenosis. L4-5: Moderate disc bulge and mild facet hypertrophy. Mild bilateral foraminal stenosis and moderate canal stenosis. L5-S1: Mild disc bulge with small central protrusion, central annular fissure, and endplate marginal osteophytes. Mild bilateral foraminal stenosis. No significant canal stenosis. IMPRESSION: 1. T12-L1 discitis osteomyelitis. Associated inflammation and thickening extending from T11-L1 results in moderate stenosis of the thecal sac. No discrete paravertebral or epidural abscess at this level. 2. L3-4 discitis osteomyelitis with moderate collapse of vertebral bodies and bony retropulsion. Bony retropulsion results in severe spinal canal stenosis. 3. L3-4 disc abscess extending into bilateral psoas muscles. No epidural component. 4. Mild discogenic degenerative changes at additional lumbar spinal levels. Electronically Signed   By: Mitzi Hansen M.D.   On: 09/30/2018 05:32   Ct Abdomen Pelvis W Contrast  Result Date: 09/30/2018 CLINICAL DATA:  Abd pain, acute, generalized. Patient reports bilateral leg pain and abdominal distension. Patient reports low back pain for "forever". EXAM: CT ABDOMEN AND PELVIS WITH CONTRAST TECHNIQUE: Multidetector CT imaging of the abdomen and pelvis was performed using the standard protocol following bolus administration of intravenous contrast. CONTRAST:  ISOVUE-300 IOPAMIDOL (ISOVUE-300) INJECTION 61% COMPARISON:  Abdominal CT 11/19/2016. Lumbar spine radiograph 05/09/2018. FINDINGS: Lower chest: Distal esophageal Autry thickening. Minimal subpleural scarring in the right lower lobe. No pleural fluid. Hepatobiliary: Cirrhotic hepatic  morphology with nodular contours. Diffusely heterogeneous hepatic parenchyma without evidence of focal mass. Gallbladder physiologically distended, no calcified stone. No biliary dilatation. Pancreas: No ductal dilatation or inflammation. Spleen: Splenomegaly with spleen measuring 21.1 cm cranial caudal. No focal abnormality. Adrenals/Urinary Tract: No adrenal nodule. No hydronephrosis or perinephric edema. Homogeneous renal enhancement with symmetric excretion on delayed phase imaging. Urinary bladder is only minimally distended and not well evaluated. Stomach/Bowel: Bowel evaluation is limited in the absence of enteric contrast and paucity of intra-abdominal fat. There is Imel thickening of the distal esophagus. Equivocal gastric Niebuhr thickening versus nondistention. No small bowel dilatation or obstruction. Small to moderate volume of stool throughout the colon. Appendix tentatively identified, no evidence of appendicitis. Vascular/Lymphatic: Aortic atherosclerosis without aneurysm. Dilatation of the main portal vein at 2 cm, no visualized portal vein thrombus. Retroaortic left renal vein. Multiple prominent retroperitoneal nodes are likely reactive. Prominent portacaval node measuring 17 mm is nonspecific. Reproductive: Prostate is unremarkable. Other: Small volume of perihepatic ascites. Generalized mesenteric edema. No free air. Musculoskeletal: Bony destruction centered at L3-L4 with disc space loss and associated endplate irregularity. Ill-defined perivertebral fluid collection within the right ileus psoas muscle, for example image 46 series 2 consistent with abscess. Posterior extent causes mass effect on the spinal canal. There is irregular widening of the right sacroiliac joint that has progressed from  right hip CT 05/11/2018. Endplate irregularity with disc space narrowing at T12-L1 is new from lumbar spine radiographs. IMPRESSION: 1. Findings consistent with discitis osteomyelitis at L3-L4 with small  continguous paravertebral abscess in the right ileus psoas muscle. Additional endplate irregularity at T12-L1 is suspicious for additional sites of discitis/osteomyelitis. Progressive right sacroiliac joint widening and irregularity suspicious for septic joint. Recommend MRI to characterize extent and evaluate for epidural abscess. 2. Cirrhosis with portal hypertension and splenomegaly. 3. Distal esophageal Polka thickening may be secondary to varices. 4.  Aortic Atherosclerosis (ICD10-I70.0). These results were called by telephone at the time of interpretation on 09/30/2018 at 12:45 am to PA [facility] , who verbally acknowledged these results. Electronically Signed   By: Narda Rutherford M.D.   On: 09/30/2018 00:47     Assessment/Plan: 47 year old presented to the ED last night after a 3-week history of back pain.  He has a history of polysubstance abuse and has for quite some time now.  MRI shows osteomyelitis and discitis at T12-L1 as well as L3-L4 with endplate edema at both levels.  Marked collapse of vertebral body at L3-4 bony retropulsion using severe spinal stenosis.  Abscess noted at L3-4 that extends into bilateral psoas muscles.  I do not believe that he needs any surgical intervention at this time.  I believe that this is best treated by medicine with IV antibiotics.  His strength in his lower extremities is very strong.  we will continue to follow him.  He is call with any concerns or questions.  Tiana Loft Lamoyne Palencia 09/30/2018 11:52 AM

## 2018-09-30 NOTE — Progress Notes (Signed)
  Echocardiogram 2D Echocardiogram has been performed.  Dan [name] L Androw 09/30/2018, 11:11 AM

## 2018-09-30 NOTE — Plan of Care (Signed)

## 2018-10-01 ENCOUNTER — Inpatient Hospital Stay (HOSPITAL_COMMUNITY): Payer: Medicaid Other

## 2018-10-01 ENCOUNTER — Encounter (HOSPITAL_COMMUNITY): Payer: Self-pay | Admitting: Interventional Radiology

## 2018-10-01 DIAGNOSIS — K7031 Alcoholic cirrhosis of liver with ascites: Secondary | ICD-10-CM | POA: Diagnosis present

## 2018-10-01 DIAGNOSIS — K6812 Psoas muscle abscess: Secondary | ICD-10-CM | POA: Diagnosis present

## 2018-10-01 DIAGNOSIS — E440 Moderate protein-calorie malnutrition: Secondary | ICD-10-CM

## 2018-10-01 DIAGNOSIS — M4646 Discitis, unspecified, lumbar region: Secondary | ICD-10-CM

## 2018-10-01 HISTORY — PX: IR LUMBAR DISC ASPIRATION W/IMG GUIDE: IMG5306

## 2018-10-01 LAB — COMPREHENSIVE METABOLIC PANEL
ALT: 13 U/L (ref 0–44)
AST: 16 U/L (ref 15–41)
Albumin: 1.9 g/dL — ABNORMAL LOW (ref 3.5–5.0)
Alkaline Phosphatase: 122 U/L (ref 38–126)
Anion gap: 7 (ref 5–15)
BUN: 12 mg/dL (ref 6–20)
CHLORIDE: 107 mmol/L (ref 98–111)
CO2: 22 mmol/L (ref 22–32)
CREATININE: 0.8 mg/dL (ref 0.61–1.24)
Calcium: 7.7 mg/dL — ABNORMAL LOW (ref 8.9–10.3)
Glucose, Bld: 114 mg/dL — ABNORMAL HIGH (ref 70–99)
POTASSIUM: 4.3 mmol/L (ref 3.5–5.1)
SODIUM: 136 mmol/L (ref 135–145)
Total Bilirubin: 0.3 mg/dL (ref 0.3–1.2)
Total Protein: 6.1 g/dL — ABNORMAL LOW (ref 6.5–8.1)

## 2018-10-01 LAB — CBC
HEMATOCRIT: 30.9 % — AB (ref 39.0–52.0)
HEMOGLOBIN: 9.5 g/dL — AB (ref 13.0–17.0)
MCH: 25.8 pg — ABNORMAL LOW (ref 26.0–34.0)
MCHC: 30.7 g/dL (ref 30.0–36.0)
MCV: 84 fL (ref 80.0–100.0)
NRBC: 0 % (ref 0.0–0.2)
Platelets: 88 10*3/uL — ABNORMAL LOW (ref 150–400)
RBC: 3.68 MIL/uL — ABNORMAL LOW (ref 4.22–5.81)
RDW: 15.5 % (ref 11.5–15.5)
WBC: 4.4 10*3/uL (ref 4.0–10.5)

## 2018-10-01 LAB — SEDIMENTATION RATE: Sed Rate: 36 mm/hr — ABNORMAL HIGH (ref 0–16)

## 2018-10-01 LAB — C-REACTIVE PROTEIN: CRP: 1.1 mg/dL — ABNORMAL HIGH (ref <1.0)

## 2018-10-01 MED ORDER — MIDAZOLAM HCL 2 MG/2ML IJ SOLN
INTRAMUSCULAR | Status: AC | PRN
  Administered 2018-10-01 (×4): 1 mg via INTRAVENOUS

## 2018-10-01 MED ORDER — LIDOCAINE HCL (PF) 1 % IJ SOLN
INTRAMUSCULAR | Status: AC | PRN
  Administered 2018-10-01: 10 mL

## 2018-10-01 MED ORDER — VANCOMYCIN HCL IN DEXTROSE 1-5 GM/200ML-% IV SOLN
1000.0000 mg | Freq: Three times a day (TID) | INTRAVENOUS | Status: DC
  Administered 2018-10-01 – 2018-10-03 (×5): 1000 mg via INTRAVENOUS
  Filled 2018-10-01 (×7): qty 200

## 2018-10-01 MED ORDER — SODIUM CHLORIDE 0.9 % IV SOLN
2.0000 g | INTRAVENOUS | Status: DC
  Administered 2018-10-01 – 2018-10-11 (×11): 2 g via INTRAVENOUS
  Filled 2018-10-01 (×12): qty 20

## 2018-10-01 MED ORDER — ENSURE ENLIVE PO LIQD
237.0000 mL | Freq: Three times a day (TID) | ORAL | Status: DC
  Administered 2018-10-01 – 2018-10-12 (×28): 237 mL via ORAL

## 2018-10-01 MED ORDER — ADULT MULTIVITAMIN W/MINERALS CH
1.0000 | ORAL_TABLET | Freq: Every day | ORAL | Status: DC
  Administered 2018-10-01 – 2018-10-12 (×12): 1 via ORAL
  Filled 2018-10-01 (×12): qty 1

## 2018-10-01 MED ORDER — FENTANYL CITRATE (PF) 100 MCG/2ML IJ SOLN
INTRAMUSCULAR | Status: AC | PRN
  Administered 2018-10-01 (×2): 50 ug via INTRAVENOUS

## 2018-10-01 MED ORDER — MIDAZOLAM HCL 2 MG/2ML IJ SOLN
INTRAMUSCULAR | Status: AC
  Filled 2018-10-01: qty 4

## 2018-10-01 MED ORDER — FENTANYL CITRATE (PF) 100 MCG/2ML IJ SOLN
INTRAMUSCULAR | Status: AC
  Filled 2018-10-01: qty 2

## 2018-10-01 MED ORDER — OXYCODONE HCL 5 MG PO TABS
15.0000 mg | ORAL_TABLET | ORAL | Status: DC | PRN
  Administered 2018-10-01 – 2018-10-02 (×10): 15 mg via ORAL
  Filled 2018-10-01 (×10): qty 3

## 2018-10-01 MED ORDER — LIDOCAINE HCL 1 % IJ SOLN
INTRAMUSCULAR | Status: AC
  Filled 2018-10-01: qty 20

## 2018-10-01 NOTE — Procedures (Signed)
Interventional Radiology Procedure Note  Procedure: Imaging guided L3-L4 disc aspiration for sample.    Complications: None  Recommendations:  - Follow up culture - ok to advance diet - Routine care   Signed,  Yvone Neu. Loreta Ave, DO

## 2018-10-01 NOTE — Progress Notes (Signed)
Initial Nutrition Assessment  DOCUMENTATION CODES:  Non-severe (moderate) malnutrition in context of social or environmental circumstances  INTERVENTION:  Increase Ensure to TID. Each supplement provides 350 kcal and 20 grams of protein  MVI with minerals  Patient reports Food insecurity-not having enough money to purchase food. 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 Presented w/ complaint of back pain, subjective fevers/chills and poor intake at home. Admits he is using IVD at home. Imaging revealed discitis and L3/L4 abscess into bilateral psoas muscle. Admitted for management.   Patient reports PTA, he has been eating poorly due to pain. He does not provide further information. He says this has been going on for >1 month. He believes his UBW is 250 lbs and he has lost >100 lbs in 8-9 months.   At baseline, he reports being functionally able to perform ADLs. He follows a regular diet. He does not take supplements or vitamins. He reports Food insecurity in that he does not have money for food-though this may very well be direct result of spending money on his recreational habits.   At this time, he denies n/v/c/d. His only concern is pain management. He is currently eating well-he is documented as eating 100% of meals and when he is seen at lunch today, he has 2 plates of food. Will order MVI with min given suspected poor nutrition status at baseline  His current listed weight thought to have been pulled from earlier encounter, not measured. Though pt reports loss of >100 lbs in 8  months, chart shows he was in low 150s in 2017. Bed weight today was ~160 lbs.   Labs: Albumin: 1.9, Hgb:9.5, Glu:114, elevated sed rate/crp.  Meds: Ensure Enlive-drinking. IVF, Versed, oxycodone  Recent Labs  Lab 09/29/18 2235 10/01/18 0449  NA 137 136  K 3.9 4.3  CL 102 107  CO2 29 22  BUN 19 12  CREATININE 0.67 0.80  CALCIUM 8.0* 7.7*  GLUCOSE 105* 114*   NUTRITION - FOCUSED PHYSICAL EXAM:   Most Recent Value  Orbital Region  Mild depletion  Upper Arm Region  Mild depletion  Thoracic and Lumbar Region  Moderate depletion  Buccal Region  Mild depletion  Temple Region  Moderate depletion  Clavicle Bone Region  Moderate depletion  Clavicle and Acromion Bone Region  Moderate depletion  Scapular Bone Region  Unable to assess  Dorsal Hand  No depletion  Patellar Region  No depletion  Anterior Thigh Region  Unable to assess  Posterior Calf Region  No depletion  Edema (RD Assessment)  None  Hair  Reviewed  Eyes  Reviewed  Mouth  Reviewed  Skin  Reviewed  Nails  Reviewed     Diet Order:   Diet Order            Diet regular Room service appropriate? Yes; Fluid consistency: Thin  Diet effective now             EDUCATION NEEDS:  No education needs have been identified at this time  Skin: Skin tear to left leg  Last BM:  10/19  Height:  Ht  Readings from Last 1 Encounters:  09/29/18 5\' 11"  (1.803 m)   Weight:  Wt Readings from Last 1 Encounters:  10/01/18 72.8 kg   Wt Readings from Last 10 Encounters:  10/01/18 72.8 kg  05/10/18 63.5 kg  10/31/16 68.9 kg  10/08/16 72.6 kg  09/30/16 72.4 kg  07/14/16 74.3 kg   Ideal Body Weight:  78.18 kg  BMI:  Body mass index is 22.38 kg/m.  Estimated Nutritional Needs:  Kcal:  2200-2400 kcals (30-33 kcal/kg bw) Protein:  102-116g Pro (1.4-1.6g Pro) Fluid:  2.2-2.4 L fluid (14ml/kcal)  Christophe Louis RD, LDN, CNSC Clinical Nutrition Available Tues-Sat via Pager: [phone] 10/01/2018 12:01 PM

## 2018-10-01 NOTE — Progress Notes (Signed)
Pt in pain oxy not helping RN paged Jarvis Newcomer, MD awaiting call back

## 2018-10-01 NOTE — Progress Notes (Signed)
  Pharmacy Antibiotic Note  Dan [name] is a 47 y.o. male admitted on 09/29/2018 with discitis  Plan: Vancomycin 1 g q8h Ceftriaxone 2 g q24h Monitor renal fx cx vt prn  Height: 5\' 11"  (180.3 cm) Weight: 160 lb 7.9 oz (72.8 kg)(Bed weight) IBW/kg (Calculated) : 75.3  Temp (24hrs), Avg:97.9 F (36.6 C), Min:97.5 F (36.4 C), Max:98.1 F (36.7 C)  Recent Labs  Lab 09/29/18 2235 09/29/18 2329 09/30/18 0107 10/01/18 0449  WBC 5.5  --   --  4.4  CREATININE 0.67  --   --  0.80  LATICACIDVEN  --  1.19 0.76  --     Estimated Creatinine Clearance: 115 mL/min (by C-G formula based on SCr of 0.8 mg/dL).    No Known Allergies  Isaac Bliss, PharmD, BCPS, BCCCP Clinical Pharmacist [phone]  Please check AMION for all [facility] numbers  10/01/2018 7:04 PM

## 2018-10-01 NOTE — Progress Notes (Signed)
PROGRESS NOTE  Dan [name]  RFF:[id number] DOB: Feb 18, 1971 DOA: 09/29/2018 PCP: Imagene Riches, NP   Brief Narrative: Dan [name] is a 47 y.o. 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 He was afebrile with normal WBC but imaging demonstrated thoracolumbar discitis/osteomyelitis at multiple levels with marked collapse/retropulsion of vertebral body at L3-4 with severe spinal stenosis and abscess extending to bilateral psoas muscles. Neurosurgery was consulted, though the patient has no myelopathic symptoms or signs and medical management was recommended. ID was consulted, recommended vancomycin and ceftriaxone following abscess sampling which was performed 10/19.  Assessment & Plan: Active Problems:   Discitis of lumbar region   Discitis   Malnutrition of moderate degree  Thoracolumbar discitis with psoas abscess: s/p sampling by IR 10/19.  - Follow blood and abscess culture data.  - Started ceftriaxone and vancomycin per ID recommendations. Will require prolonged IV antibiotics. If blood cultures become positive, would proceed with cardiac imaging. - Sent ESR, CRP, modestly elevated. HIV pending.  L3-4 vertebral body collapse, retropulsion causing severe spinal stenosis: No myelopathy. Neurosurgery consulted, recommending IV antibiotics, no surgery.  - PT/OT - Pain control. Pt has significant tolerance, so increase oxycodone to 64m prn and monitor. May need to increase. Discussed that there is no indication for IV analgesics. - Abx as above  Liver cirrhosis with ascites, portal hypertensive gastropathy, history of bleeding gastric ulcer,  thrombocytopenia: CT abd/pelvis without acute findings and not very tender on exam. Mentating normally and no evidence of GI bleeding.  - Monitor mental status, abdominal exam, CBC closely.  - Avoid NSAIDs and tylenol.  Polysubstance abuse, IVDU:  - Cessation counseling provided.   Hepatitis B and C:  - Follow up as outpatient  Moderate protein calorie malnutrition:  - Protein supplemented  Anemia of chronic disease:  - Monitor  DVT prophylaxis: SCDs Code Status: Full Family Communication: None at bedside Disposition Plan: Uncertain, will require prolonged hospitalization for culture monitoring and work up as indicated followed by protracted IV antibiotics.  Consultants:   Neurosurgery, Dr. OZada Finders Infectious Diseases, Dr. SBaxter Flattery Interventional radiology, Dr. WEarleen Newport Procedures:   Imaging guided L3-L4 disc aspiration for sample 10/01/2018 by Dr. WEarleen Newport  Antimicrobials:  Vancomycin 10/18 >>   Zosyn 10/17  Ceftriaxone 10/19 >>    Subjective: Pain in lower back is constant, severe, nonradiating, worse with movement, only minimally improved with 572moxycodone.   Objective: Vitals:   10/01/18 1131 10/01/18 1155 10/01/18 1157 10/01/18 1506  BP: 128/75 (!) 144/94  (!) 140/92  Pulse: 70 86  88  Resp: 12     Temp: 97.8 F (36.6 C) 97.9 F (36.6 C)  98 F (36.7 C)  TempSrc: Oral Oral  Oral  SpO2: 100% 100%  98%  Weight:   72.8 kg   Height:        Intake/Output Summary (Last 24 hours) at 10/01/2018 1850 Last data filed at 10/01/2018 1626 Gross per 24 hour  Intake 840 ml  Output 1860 ml  Net -1020 ml   Filed Weights   09/29/18 1828 10/01/18 1157  Weight: 63.5 kg 72.8 kg    Gen: Chronically ill-appearing male in no  distress  Pulm: Non-labored breathing room air. Clear to auscultation bilaterally.  CV: Regular rate and rhythm. No murmur, rub, or gallop. No JVD, no pedal edema. GI: Abdomen distended, not significant tender, no rebound or guarding.  +BS. Ext: Warm, no deformities. Skin: Back wound hemostatic. Neuro: Alert and oriented. No focal neurological deficits. Psych: Judgement and insight appear normal. Mood & affect appropriate.   Data Reviewed: I have personally reviewed following labs and imaging studies  CBC: Recent Labs  Lab 09/29/18 2235 10/01/18 0449  WBC 5.5 4.4  HGB 10.5* 9.5*  HCT 34.7* 30.9*  MCV 84.8 84.0  PLT 100* 88*   Basic Metabolic Panel: Recent Labs  Lab 09/29/18 2235 10/01/18 0449  NA 137 136  K 3.9 4.3  CL 102 107  CO2 29 22  GLUCOSE 105* 114*  BUN 19 12  CREATININE 0.67 0.80  CALCIUM 8.0* 7.7*   GFR: Estimated Creatinine Clearance: 115 mL/min (by C-G formula based on SCr of 0.8 mg/dL). Liver Function Tests: Recent Labs  Lab 09/29/18 2235 10/01/18 0449  AST 18 16  ALT 14 13  ALKPHOS 137* 122  BILITOT 0.4 0.3  PROT 7.3 6.1*  ALBUMIN 2.2* 1.9*   Recent Labs  Lab 09/29/18 2235  LIPASE 42   No results for input(s): AMMONIA in the last 168 hours. Coagulation Profile: Recent Labs  Lab 09/30/18 1618  INR 1.24   Cardiac Enzymes: No results for input(s): CKTOTAL, CKMB, CKMBINDEX, TROPONINI in the last 168 hours. BNP (last 3 results) No results for input(s): PROBNP in the last 8760 hours. HbA1C: No results for input(s): HGBA1C in the last 72 hours. CBG: No results for input(s): GLUCAP in the last 168 hours. Lipid Profile: No results for input(s): CHOL, HDL, LDLCALC, TRIG, CHOLHDL, LDLDIRECT in the last 72 hours. Thyroid Function Tests: No results for input(s): TSH, T4TOTAL, FREET4, T3FREE, THYROIDAB in the last 72 hours. Anemia Panel: No results for input(s): VITAMINB12, FOLATE, FERRITIN, TIBC, IRON, RETICCTPCT in the last 72 hours. Urine analysis:    Component Value Date/Time   COLORURINE AMBER (A) 09/29/2018 2332   APPEARANCEUR HAZY (A) 09/29/2018 2332   LABSPEC 1.021 09/29/2018 2332   PHURINE 7.0 09/29/2018 2332   GLUCOSEU NEGATIVE 09/29/2018 2332   HGBUR  NEGATIVE 09/29/2018 2332   BILIRUBINUR NEGATIVE 09/29/2018 2332   KETONESUR NEGATIVE 09/29/2018 2332   PROTEINUR NEGATIVE 09/29/2018 2332   NITRITE NEGATIVE 09/29/2018 2332   LEUKOCYTESUR NEGATIVE 09/29/2018 2332   Recent Results (from the past 240 hour(s))  Blood Culture (routine x 2)     Status: None (Preliminary result)   Collection Time: 09/29/18 11:32 PM  Result Value Ref Range Status   Specimen Description   Final    BLOOD LEFT HAND Performed at [facility], [place] [address] [place], [place] [address]    Special Requests   Final    BOTTLES DRAWN AEROBIC AND ANAEROBIC Blood Culture results may not be optimal due to an inadequate volume of blood received in culture bottles Performed at [place] [address], [place], [place] [address]    Culture   Final    NO GROWTH 1 DAY Performed at [facility], [place] [address] [place], [place] [address]    Report Status PENDING  Incomplete  Blood Culture (routine x 2)     Status: None (Preliminary result)   Collection Time: 09/29/18 11:32 PM  Result Value Ref Range Status   Specimen Description   Final  BLOOD RIGHT FOREARM Performed at [facility], [place] [address] [place], [place] [address]    Special Requests   Final    BOTTLES DRAWN AEROBIC AND ANAEROBIC Blood Culture results may not be optimal due to an inadequate volume of blood received in culture bottles Performed at [place] [address] [place], [place] [address]    Culture   Final    NO GROWTH 1 DAY Performed at [facility], [place] [address] [place], [place] [address]    Report Status PENDING  Incomplete  Urine culture     Status: Abnormal (Preliminary result)   Collection Time: 09/29/18 11:32 PM  Result Value Ref Range Status   Specimen Description   Final    URINE, CLEAN CATCH Performed at [facility], [place] [address] [place], [place] [address]    Special Requests    Final    NONE Performed at [facility], [place] [address], [place], [place] [address]    Culture >=100,000 COLONIES/mL SERRATIA MARCESCENS (A)  Final   Report Status PENDING  Incomplete  Aerobic/Anaerobic Culture (surgical/deep wound)     Status: None (Preliminary result)   Collection Time: 10/01/18 10:26 AM  Result Value Ref Range Status   Specimen Description ABSCESS VERTEBRA L3 L4  Final   Special Requests NONE  Final   Gram Stain   Final    FEW WBC PRESENT, PREDOMINANTLY PMN NO ORGANISMS SEEN Performed at [facility], [place] [address] [place], [place] [address]    Culture PENDING  Incomplete   Report Status PENDING  Incomplete      Radiology Studies: Dg Chest 2 View  Result Date: 09/29/2018 CLINICAL DATA:  Fever, body aches. EXAM: CHEST - 2 VIEW COMPARISON:  Radiograph 08/21/2017 FINDINGS: Curvilinear lucencies paralleling the right chest Lackman consistent with skin folds, lung markings are seen extending to the pleural surface. Similar appearance in the left mid thorax to a lesser extent. Chronic bronchial thickening and right upper lobe scarring. No acute airspace disease. Minimal blunting of right costophrenic angle, possible trace effusion. No pulmonary edema. No acute osseous abnormalities. IMPRESSION: 1. Chronic bronchitic change in right upper lobe scarring. No acute airspace disease. 2. Curvilinear lucencies paralleling right greater than left chest walls consistent with skin folds. Electronically Signed   By: Keith Rake M.D.   On: 09/29/2018 23:15   Ct Head Wo Contrast  Result Date: 09/30/2018 CLINICAL DATA:  Subacute left vision loss, unable to see out of left eye for 1 month EXAM: CT HEAD WITHOUT CONTRAST TECHNIQUE: Contiguous axial images were obtained from the base of the skull through the vertex without intravenous contrast. COMPARISON:  CT brain 05/30/2013 FINDINGS: Brain: No evidence of acute infarction, hemorrhage, hydrocephalus,  extra-axial collection or mass lesion/mass effect. Vascular: No hyperdense vessel or unexpected calcification. Skull: Normal. Negative for fracture or focal lesion. Sinuses/Orbits: No acute finding. Other: None IMPRESSION: Negative.  No CT evidence for acute intracranial abnormality Electronically Signed   By: Donavan Foil M.D.   On: 09/30/2018 00:33   Mr Lumbar Spine W Wo Contrast  Result Date: 09/30/2018 CLINICAL DATA:  47 y/o  M; abdominal pain, back pain, hematuria. EXAM: MRI LUMBAR SPINE WITHOUT AND WITH CONTRAST TECHNIQUE: Multiplanar and multiecho pulse sequences of the lumbar spine were obtained without and with intravenous contrast. CONTRAST:  6.3 cc Gadavist COMPARISON:  09/30/2018 CT abdomen and pelvis. 05/09/2018 lumbar spine radiographs. 09/19/2011 lumbar spine MRI. FINDINGS: Segmentation:  Standard. Alignment:  Physiologic. Vertebrae: T12-L1 discitis osteomyelitis. Associated anterior and lateral epidural enhancement extending from mid T11 to lower L1 with moderate stenosis of the thecal sac. Additionally, there is paravertebral soft tissue inflammation extending into the bilateral T12-L1 neural foramen. No discrete epidural abscess or paravertebral abscess at these levels. L3-4 discitis osteomyelitis with destructive changes of the opposing endplates, mild-to-moderate loss of vertebral body height, and disc abscess extending into the psoas muscle insertions bilaterally. The right-sided psoas abscess measures 3.8 x 2.1 cm (series 12, image 23) and the left-sided psoas abscess measures 3.0 x 1.7 cm. Paravertebral soft tissue inflammation extends into the bilateral L3-4 neural foramen which are effaced. Additionally, collapse and retropulsion of the posterior aspects of the L3 and L4 vertebral bodies results in severe spinal canal stenosis. No epidural abscess at this level. No additional level of discitis osteomyelitis identified. Conus medullaris and cauda equina: Conus extends to the L1 level.  Conus and cauda equina appear normal. No abnormal enhancement. Paraspinal and other soft tissues: As above. Cholelithiasis. Splenomegaly. Disc levels: T12-L1: Epidural soft tissue thickening results in moderate stenosis of the thecal sac. Paravertebral soft tissue inflammation extends into the neural foramen bilaterally. L1-2: Mild disc bulge and facet hypertrophy. No significant foraminal or canal stenosis. L2-3: Mild disc bulge and facet hypertrophy. No significant foraminal or canal stenosis. L3-4: Collapse and retropulsion of the posterior aspects of L3 and L4 vertebral bodies combined with facet hypertrophy. Moderate bilateral foraminal stenosis and severe canal stenosis. L4-5: Moderate disc bulge and mild facet hypertrophy. Mild bilateral foraminal stenosis and moderate canal stenosis. L5-S1: Mild disc bulge with small central protrusion, central annular fissure, and endplate marginal osteophytes. Mild bilateral foraminal stenosis. No significant canal stenosis. IMPRESSION: 1. T12-L1 discitis osteomyelitis. Associated inflammation and thickening extending from T11-L1 results in moderate stenosis of the thecal sac. No discrete paravertebral or epidural abscess at this level. 2. L3-4 discitis osteomyelitis with moderate collapse of vertebral bodies and bony retropulsion. Bony retropulsion results in severe spinal canal stenosis. 3. L3-4 disc abscess extending into bilateral psoas muscles. No epidural component. 4. Mild discogenic degenerative changes at additional lumbar spinal levels. Electronically Signed   By: Kristine Garbe M.D.   On: 09/30/2018 05:32   Ct Abdomen Pelvis W Contrast  Result Date: 09/30/2018 CLINICAL DATA:  Abd pain, acute, generalized. Patient reports bilateral leg pain and abdominal distension. Patient reports low back pain for "forever". EXAM: CT ABDOMEN AND PELVIS WITH CONTRAST TECHNIQUE: Multidetector CT imaging of the abdomen and pelvis was performed using the standard  protocol following bolus administration of intravenous contrast. CONTRAST:  125m ISOVUE-300 IOPAMIDOL (ISOVUE-300) INJECTION 61% COMPARISON:  Abdominal CT 11/19/2016. Lumbar spine radiograph 05/09/2018. FINDINGS: Lower chest: Distal esophageal Thwaites thickening. Minimal subpleural scarring in the right lower lobe. No pleural fluid. Hepatobiliary: Cirrhotic hepatic morphology with nodular contours. Diffusely heterogeneous hepatic parenchyma without evidence of focal mass. Gallbladder physiologically distended, no calcified stone. No biliary dilatation. Pancreas: No ductal dilatation or inflammation. Spleen: Splenomegaly with spleen measuring 21.1 cm cranial caudal. No focal abnormality. Adrenals/Urinary Tract: No adrenal nodule. No hydronephrosis or perinephric edema. Homogeneous renal enhancement with symmetric excretion on delayed phase imaging. Urinary bladder is only minimally distended and not well evaluated. Stomach/Bowel: Bowel evaluation is limited in the absence of enteric contrast and paucity of intra-abdominal fat. There is Reeder thickening of the distal esophagus. Equivocal gastric Linzy thickening versus nondistention. No small bowel dilatation or obstruction. Small to moderate volume of stool throughout the colon. Appendix tentatively identified, no  evidence of appendicitis. Vascular/Lymphatic: Aortic atherosclerosis without aneurysm. Dilatation of the main portal vein at 2 cm, no visualized portal vein thrombus. Retroaortic left renal vein. Multiple prominent retroperitoneal nodes are likely reactive. Prominent portacaval node measuring 17 mm is nonspecific. Reproductive: Prostate is unremarkable. Other: Small volume of perihepatic ascites. Generalized mesenteric edema. No free air. Musculoskeletal: Bony destruction centered at L3-L4 with disc space loss and associated endplate irregularity. Ill-defined perivertebral fluid collection within the right ileus psoas muscle, for example image 46 series 2  consistent with abscess. Posterior extent causes mass effect on the spinal canal. There is irregular widening of the right sacroiliac joint that has progressed from right hip CT 05/11/2018. Endplate irregularity with disc space narrowing at T12-L1 is new from lumbar spine radiographs. IMPRESSION: 1. 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 Additional endplate irregularity at T12-L1 is suspicious for additional sites of discitis/osteomyelitis. Progressive right sacroiliac joint widening and irregularity suspicious for septic joint. Recommend MRI to characterize extent and evaluate for epidural abscess. 2. Cirrhosis with portal hypertension and splenomegaly. 3. Distal esophageal Dowlen thickening may be secondary to varices. 4.  Aortic Atherosclerosis (ICD10-I70.0). These results were called by telephone at the time of interpretation on 09/30/2018 at 12:45 am to [place] , who verbally acknowledged these results. Electronically Signed   By: Keith Rake M.D.   On: 09/30/2018 00:47   Ir Lumbar Disc Aspiration W/img Guide  Result Date: 10/01/2018 INDICATION: 47 year old male with a history of discitis/osteomyelitis. He has been referred for diagnostic aspiration EXAM: IMAGE GUIDED [place] DIAGNOSTIC PURPOSE MEDICATIONS: The patient is currently admitted to the hospital and receiving intravenous antibiotics. The antibiotics were administered within an appropriate time frame prior to the initiation of the procedure. ANESTHESIA/SEDATION: Fentanyl 4.0 mcg IV; Versed 100 mg IV Moderate Sedation Time:  11 minutes The patient was continuously monitored during the procedure by the interventional radiology nurse under my direct supervision. COMPLICATIONS: None PROCEDURE: The procedure risks, benefits, and alternatives were explained to the patient. Questions regarding the procedure were encouraged and  answered. The patient understands and consents to the procedure. Patient position prone position. Scout images of the lumbar spine were stored and sent to PACs. The skin of the lumbar spine was prepped with Chlorhexidine in a sterile fashion, and a sterile drape was applied covering the operative field. A sterile gown and sterile gloves were used for the procedure. Local anesthesia was provided with 1% Lidocaine. Using biplane fluoroscopy, a right posterior oblique approach was performed. 1% lidocaine was used for local anesthesia with deep 1% lidocaine. 18 gauge trocar was advanced under x-ray guidance to the disc space. Sample of approximately 2 cc of fluid was aspirated and sent for culture. Needle was removed and a sterile bandage was placed. Manual pressure was used for hemostasis and a sterile dressing was placed. No complications were encountered no significant blood loss was encountered. Patient tolerated the procedure well and remained hemodynamically stable throughout. IMPRESSION: Status post disc aspiration of L3-L4 fluid for diagnostic sample. Signed, Dulcy Fanny. Dellia Nims, RPVI [facility] Radiology Electronically Signed   By: Corrie Mckusick D.O.   On: 10/01/2018 10:47    Scheduled Meds: . feeding supplement (ENSURE ENLIVE)  237 mL Oral TID BM  . lidocaine      . multivitamin with minerals  1 tablet Oral Daily   Continuous Infusions: . sodium chloride 1,000 mL (10/01/18 0534)  LOS: 1 day   Time spent: 35 minutes.  Patrecia Pour, MD [facility] [email or web address] Password [email or web address] 10/01/2018, 6:50 PM

## 2018-10-02 LAB — URINE CULTURE: Culture: >=100000 — AB

## 2018-10-02 MED ORDER — OXYCODONE HCL ER 15 MG PO T12A
15.0000 mg | EXTENDED_RELEASE_TABLET | Freq: Two times a day (BID) | ORAL | Status: DC
  Administered 2018-10-02: 15 mg via ORAL
  Filled 2018-10-02: qty 1

## 2018-10-02 MED ORDER — ACETAMINOPHEN 325 MG PO TABS
650.0000 mg | ORAL_TABLET | Freq: Four times a day (QID) | ORAL | Status: DC | PRN
  Administered 2018-10-02 – 2018-10-05 (×5): 650 mg via ORAL
  Filled 2018-10-02 (×5): qty 2

## 2018-10-02 MED ORDER — KETOROLAC TROMETHAMINE 15 MG/ML IJ SOLN
7.5000 mg | Freq: Three times a day (TID) | INTRAMUSCULAR | Status: DC
  Administered 2018-10-02 – 2018-10-03 (×3): 7.5 mg via INTRAVENOUS
  Filled 2018-10-02 (×2): qty 1

## 2018-10-02 MED ORDER — KETOROLAC TROMETHAMINE 15 MG/ML IJ SOLN
15.0000 mg | Freq: Three times a day (TID) | INTRAMUSCULAR | Status: DC
  Filled 2018-10-02: qty 1

## 2018-10-02 MED ORDER — OXYCODONE HCL 5 MG PO TABS
10.0000 mg | ORAL_TABLET | ORAL | Status: DC | PRN
  Administered 2018-10-02 – 2018-10-05 (×20): 20 mg via ORAL
  Filled 2018-10-02 (×20): qty 4

## 2018-10-02 NOTE — Plan of Care (Signed)

## 2018-10-02 NOTE — Progress Notes (Signed)
PROGRESS NOTE  Dan [name]  QMV:[id number] DOB: 12-16-1970 DOA: 09/29/2018 PCP: Imagene Riches, NP   Brief Narrative: Dan [name] is a 47 y.o. 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 He was afebrile with normal WBC but imaging demonstrated thoracolumbar discitis/osteomyelitis at multiple levels with marked collapse/retropulsion of vertebral body at L3-4 with severe spinal stenosis and abscess extending to bilateral psoas muscles. Neurosurgery was consulted, though the patient has no myelopathic symptoms or signs and medical management was recommended. ID was consulted, recommended vancomycin and ceftriaxone following abscess sampling which was performed 10/19.  Assessment & Plan: Principal Problem:   Discitis of thoracolumbar region Active Problems:   Other cirrhosis of liver (HCC)   Chronic hepatitis B (HCC)   Chronic hepatitis C without hepatic coma (HCC)   IV drug abuse (HCC)   Malnutrition of moderate degree   Alcoholic cirrhosis of liver with ascites (HCC)   Psoas abscess (HCC)  Thoracolumbar discitis with psoas abscess: s/p sampling by IR 10/19. ESR and CRP modestly elevated. - Follow blood and abscess culture data.  - Started ceftriaxone and vancomycin per ID recommendations. Will require prolonged IV antibiotics. No vegetation on echocardiogram.  L3-4 vertebral body collapse, retropulsion causing severe spinal stenosis: No myelopathy. Neurosurgery consulted, recommending IV antibiotics, no surgery.  - PT/OT - Pain control: Significant tolerance, will increase oxycodone to 64m q3h prn. No indication for IV narcotics. Has been on  oxycontin in the past and if no sedation on this dose would start oxycontin tonight.   - Abx as above  Liver cirrhosis with ascites, portal hypertensive gastropathy, history of bleeding gastric ulcer, thrombocytopenia: CT abd/pelvis without acute findings and not very tender on exam. Mentating normally and no evidence of GI bleeding.  - Monitor mental status, abdominal exam, CBC closely.  - Avoid tylenol. Will give low dose toradol for limited period of time to adjunctively treat severe pain hoping to minimize narcotics. If not improving pain in 24 hours, will discontinue.  Polysubstance abuse, IVDU:  - Cessation counseling provided.   Hepatitis B and C:  - Follow up as outpatient  Moderate protein calorie malnutrition:  - Protein supplemented  Anemia of chronic disease:  - Monitor  DVT prophylaxis: SCDs Code Status: Full Family Communication: Neighbor at bedside per patient's request. Disposition Plan: Uncertain, will require prolonged hospitalization for culture monitoring and work up as indicated followed by protracted IV antibiotics.  Consultants:   Neurosurgery, Dr. OZada Finders Infectious Diseases, Dr. SBaxter Flattery Interventional radiology, Dr. WEarleen Newport Procedures:   Imaging guided L3-L4 disc aspiration for sample 10/01/2018 by Dr. WEarleen Newport  Echocardiogram 10/01/2018: - Left ventricle: The cavity size was normal. Steinberg thickness was   normal. Systolic function was normal. The estimated ejection   fraction was in the range of 60% to 65%. Monds motion was normal;   there were no regional Marceaux motion abnormalities. Left   ventricular diastolic function parameters were normal. - Mitral valve: Mildly thickened leaflets . There was trace to mild   regurgitation. - Left atrium: The atrium was normal in size. - Inferior vena cava: The vessel was normal in size. The   respirophasic diameter changes were  in the normal range (= 50%),   consistent with normal central venous  pressure.  Impressions:  - Compared to a prior study in 2017, there have been no changes. No   vegetation is noted.  Antimicrobials:  Vancomycin 10/18 >>   Zosyn 10/17  Ceftriaxone 10/19 >>    Subjective: Oxycodone 45m relieves pain only modestly. Low back pain remains severe, constant, worse with any movement causing him to not sit up or leave the bed or sleep last night. No numbness/weakness in legs, or incontinence. No sedation reported by RN.  Objective: Vitals:   10/01/18 1157 10/01/18 1506 10/01/18 1954 10/02/18 0610  BP:  (!) 140/92 126/86 117/77  Pulse:  88 85 88  Resp:   16   Temp:  98 F (36.7 C) 98.6 F (37 C)   TempSrc:  Oral Oral   SpO2:  98% 100% 100%  Weight: 72.8 kg     Height:        Intake/Output Summary (Last 24 hours) at 10/02/2018 1348 Last data filed at 10/02/2018 0936 Gross per 24 hour  Intake 2040.07 ml  Output 1560 ml  Net 480.07 ml   Filed Weights   09/29/18 1828 10/01/18 1157  Weight: 63.5 kg 72.8 kg   Gen: Gaunt chronically ill-appearing male in no distress Pulm: Nonlabored breathing room air. Clear. CV: Regular rate and rhythm. No murmur, rub, or gallop. No JVD, no dependent edema. GI: Abdomen soft, distended, minimal bilateral tenderness without rebound or guarding. +BS.  MSK: Warm, no deformities in extremities. Low back not examined due to patient's preference not to move. Skin: No rashes, lesions or ulcers on visualized skin.  Neuro: Alert and oriented. No focal neurological deficits. Psych: Judgement and insight appear fair. Mood anxious.  Data Reviewed: I have personally reviewed following labs and imaging studies  CBC: Recent Labs  Lab 09/29/18 2235 10/01/18 0449  WBC 5.5 4.4  HGB 10.5* 9.5*  HCT 34.7* 30.9*  MCV 84.8 84.0  PLT 100* 88*   Basic Metabolic Panel: Recent Labs  Lab 09/29/18 2235 10/01/18 0449  NA 137 136  K 3.9 4.3  CL 102 107  CO2 29 22  GLUCOSE 105* 114*  BUN 19 12  CREATININE 0.67 0.80   CALCIUM 8.0* 7.7*   GFR: Estimated Creatinine Clearance: 115 mL/min (by C-G formula based on SCr of 0.8 mg/dL). Liver Function Tests: Recent Labs  Lab 09/29/18 2235 10/01/18 0449  AST 18 16  ALT 14 13  ALKPHOS 137* 122  BILITOT 0.4 0.3  PROT 7.3 6.1*  ALBUMIN 2.2* 1.9*   Recent Labs  Lab 09/29/18 2235  LIPASE 42   No results for input(s): AMMONIA in the last 168 hours. Coagulation Profile: Recent Labs  Lab 09/30/18 1618  INR 1.24   Cardiac Enzymes: No results for input(s): CKTOTAL, CKMB, CKMBINDEX, TROPONINI in the last 168 hours. BNP (last 3 results) No results for input(s): PROBNP in the last 8760 hours. HbA1C: No results for input(s): HGBA1C in the last 72 hours. CBG: No results for input(s): GLUCAP in the last 168 hours. Lipid Profile: No results for input(s): CHOL, HDL, LDLCALC, TRIG, CHOLHDL, LDLDIRECT in the last 72 hours. Thyroid Function Tests: No results for input(s): TSH, T4TOTAL, FREET4, T3FREE, THYROIDAB in the last 72 hours. Anemia Panel: No results for input(s): VITAMINB12, FOLATE, FERRITIN, TIBC, IRON, RETICCTPCT in the last 72 hours. Urine analysis:    Component Value Date/Time   COLORURINE AMBER (A) 09/29/2018 2332   APPEARANCEUR HAZY (A) 09/29/2018  2332   LABSPEC 1.021 09/29/2018 2332   PHURINE 7.0 09/29/2018 2332   GLUCOSEU NEGATIVE 09/29/2018 2332   HGBUR NEGATIVE 09/29/2018 2332   BILIRUBINUR NEGATIVE 09/29/2018 2332   [place] 09/29/2018 2332   PROTEINUR NEGATIVE 09/29/2018 2332   NITRITE NEGATIVE 09/29/2018 2332   LEUKOCYTESUR NEGATIVE 09/29/2018 2332   Recent Results (from the past 240 hour(s))  Blood Culture (routine x 2)     Status: None (Preliminary result)   Collection Time: 09/29/18 11:32 PM  Result Value Ref Range Status   Specimen Description   Final    BLOOD LEFT HAND Performed at [facility], [place] [address] [place], [place] [address]    Special Requests   Final    BOTTLES DRAWN AEROBIC AND  ANAEROBIC Blood Culture results may not be optimal due to an inadequate volume of blood received in culture bottles Performed at [place] [address] [place], [place] [address]    Culture   Final    NO GROWTH 2 DAYS Performed at [place] [address] [place], [place] [address]    Report Status PENDING  Incomplete  Blood Culture (routine x 2)     Status: None (Preliminary result)   Collection Time: 09/29/18 11:32 PM  Result Value Ref Range Status   Specimen Description   Final    BLOOD RIGHT FOREARM Performed at [facility], [place] [address], [place], [place] [address]    Special Requests   Final    BOTTLES DRAWN AEROBIC AND ANAEROBIC Blood Culture results may not be optimal due to an inadequate volume of blood received in culture bottles Performed at [place] [address] [place], [place] [address]    Culture   Final    NO GROWTH 2 DAYS Performed at [place] [address], [place], [place] [address]    Report Status PENDING  Incomplete  Urine culture     Status: Abnormal   Collection Time: 09/29/18 11:32 PM  Result Value Ref Range Status   Specimen Description   Final    URINE, CLEAN CATCH Performed at [facility], [place] [address], [place], [place] [address]    Special Requests   Final    NONE Performed at [facility], [place] [address] [place],  [address]    Culture >=100,000 COLONIES/mL SERRATIA MARCESCENS (A)  Final   Report Status 10/02/2018 FINAL  Final   Organism ID, Bacteria SERRATIA MARCESCENS (A)  Final      Susceptibility   Serratia marcescens - MIC*    CEFAZOLIN >=64 RESISTANT Resistant     CEFTRIAXONE <=1 SENSITIVE Sensitive     CIPROFLOXACIN <=0.25 SENSITIVE Sensitive     GENTAMICIN <=1 SENSITIVE Sensitive     NITROFURANTOIN 128 RESISTANT Resistant     TRIMETH/SULFA <=20 SENSITIVE Sensitive     * >=100,000 COLONIES/mL SERRATIA  MARCESCENS  Aerobic/Anaerobic Culture (surgical/deep wound)     Status: None (Preliminary result)   Collection Time: 10/01/18 10:26 AM  Result Value Ref Range Status   Specimen Description ABSCESS VERTEBRA L3 L4  Final   Special Requests NONE  Final   Gram Stain   Final    FEW WBC PRESENT, PREDOMINANTLY PMN NO ORGANISMS SEEN    Culture   Final    FEW GRAM NEGATIVE RODS IDENTIFICATION AND SUSCEPTIBILITIES TO FOLLOW Performed at [facility], [address], [place],  [address]  Report Status PENDING  Incomplete      Radiology Studies: Ir Lumbar Disc Aspiration W/img Guide  Result Date: 10/01/2018 INDICATION: 47 year old male with a history of discitis/osteomyelitis. He has been referred for diagnostic aspiration EXAM: IMAGE GUIDED [place] DI[facility]NOSTIC PURPOSE MEDICATIONS: The patient is currently admitted to the hospital and receiving intravenous antibiotics. The antibiotics were administered within an appropriate time frame prior to the initiation of the procedure. ANESTHESIA/SEDATION: Fentanyl 4.0 mcg IV; Versed 100 mg IV Moderate Sedation Time:  11 minutes The patient was continuously monitored during the procedure by the interventional radiology nurse under my direct supervision. COMPLICATIONS: None PROCEDURE: The procedure risks, benefits, and alternatives were explained to the patient. Questions regarding the procedure were encouraged and answered. The patient understands and consents to the procedure. Patient position prone position. Scout images of the lumbar spine were stored and sent to PACs. The skin of the lumbar spine was prepped with Chlorhexidine in a sterile fashion, and a sterile drape was applied covering the operative field. A sterile gown and sterile gloves were used for the procedure. Local anesthesia was provided with 1% Lidocaine. Using biplane fluoroscopy, a right posterior oblique approach was performed. 1% lidocaine was used for local anesthesia  with deep 1% lidocaine. 18 gauge trocar was advanced under x-ray guidance to the disc space. Sample of approximately 2 cc of fluid was aspirated and sent for culture. Needle was removed and a sterile bandage was placed. Manual pressure was used for hemostasis and a sterile dressing was placed. No complications were encountered no significant blood loss was encountered. Patient tolerated the procedure well and remained hemodynamically stable throughout. IMPRESSION: Status post disc aspiration of L3-L4 fluid for diagnostic sample. Signed, Dulcy Fanny. Dellia Nims, RPVI [facility] Electronically Signed   By: Corrie Mckusick D.O.   On: 10/01/2018 10:47    Scheduled Meds: . feeding supplement (ENSURE ENLIVE)  237 mL Oral TID BM  . ketorolac  15 mg Intravenous Q8H  . multivitamin with minerals  1 tablet Oral Daily   Continuous Infusions: . cefTRIAXone (ROCEPHIN)  IV Stopped (10/01/18 2125)  . vancomycin 1,000 mg (10/02/18 1245)     LOS: 2 days   Time spent: 35 minutes.  Patrecia Pour, MD [facility] [email or web address] Password [email or web address] 10/02/2018, 1:48 PM

## 2018-10-03 DIAGNOSIS — K6812 Psoas muscle abscess: Secondary | ICD-10-CM

## 2018-10-03 DIAGNOSIS — K7031 Alcoholic cirrhosis of liver with ascites: Secondary | ICD-10-CM

## 2018-10-03 LAB — CBC
HCT: 30.6 % — ABNORMAL LOW (ref 39.0–52.0)
Hemoglobin: 9.4 g/dL — ABNORMAL LOW (ref 13.0–17.0)
MCH: 25.9 pg — ABNORMAL LOW (ref 26.0–34.0)
MCHC: 30.7 g/dL (ref 30.0–36.0)
MCV: 84.3 fL (ref 80.0–100.0)
NRBC: 0 % (ref 0.0–0.2)
PLATELETS: 83 10*3/uL — AB (ref 150–400)
RBC: 3.63 MIL/uL — ABNORMAL LOW (ref 4.22–5.81)
RDW: 16 % — AB (ref 11.5–15.5)
WBC: 4 10*3/uL (ref 4.0–10.5)

## 2018-10-03 LAB — BASIC METABOLIC PANEL
ANION GAP: 6 (ref 5–15)
BUN: 25 mg/dL — AB (ref 6–20)
CALCIUM: 7.9 mg/dL — AB (ref 8.9–10.3)
CO2: 25 mmol/L (ref 22–32)
CREATININE: 0.89 mg/dL (ref 0.61–1.24)
Chloride: 103 mmol/L (ref 98–111)
GFR calc Af Amer: >60 mL/min (ref >60)
GLUCOSE: 144 mg/dL — AB (ref 70–99)
Potassium: 4.8 mmol/L (ref 3.5–5.1)
Sodium: 134 mmol/L — ABNORMAL LOW (ref 135–145)

## 2018-10-03 MED ORDER — SENNOSIDES-DOCUSATE SODIUM 8.6-50 MG PO TABS
1.0000 | ORAL_TABLET | Freq: Two times a day (BID) | ORAL | Status: DC
  Administered 2018-10-03 – 2018-10-12 (×16): 1 via ORAL
  Filled 2018-10-03 (×19): qty 1

## 2018-10-03 MED ORDER — TRAZODONE HCL 50 MG PO TABS
50.0000 mg | ORAL_TABLET | Freq: Every day | ORAL | Status: DC
  Administered 2018-10-03: 50 mg via ORAL
  Filled 2018-10-03: qty 1

## 2018-10-03 MED ORDER — OXYCODONE HCL ER 15 MG PO T12A
30.0000 mg | EXTENDED_RELEASE_TABLET | Freq: Two times a day (BID) | ORAL | Status: DC
  Administered 2018-10-03 – 2018-10-12 (×19): 30 mg via ORAL
  Filled 2018-10-03 (×19): qty 2

## 2018-10-03 MED ORDER — POLYETHYLENE GLYCOL 3350 17 G PO PACK
17.0000 g | PACK | Freq: Every day | ORAL | Status: DC
  Administered 2018-10-03 – 2018-10-12 (×7): 17 g via ORAL
  Filled 2018-10-03 (×8): qty 1

## 2018-10-03 NOTE — Progress Notes (Signed)
[facility]  Date of Admission:  09/29/2018     Total days of antibiotics 5         ASSESSMENT/PLAN  [name] has lumbar discitis with aspiration cultures now showing Serratia marcescens with susceptibilities remaining pending. He also has Serratia in his urine, but interestingly no bacteremia. Continues to have pain in his back that is improving. He has confirmed today of recent IV drug use. Current antimicrobial regimen is vancomycin and ceftriaxone. On Day 5 of therapy with stable kidney function. Unfortunately he is not a candidate for home IV therapy based on most recent injection drug use. Discharge planning will likely include oral antimicrobial therapy. Review of his previous blood work shows he is positive for both Hepatitis B and Hepatitis C. HIV non-reactive.   1. Continue current dose of ceftriaxone and discontinue vancomycin. 2. Check Hepatitis B and C lab work. 3. Recommend TEE given recent drug use which is planned for 10/25.    Principal Problem:   Discitis of thoracolumbar region Active Problems:   Other cirrhosis of liver (HCC)   Chronic hepatitis B (HCC)   Chronic hepatitis C without hepatic coma (HCC)   IV drug abuse (HCC)   Malnutrition of moderate degree   Alcoholic cirrhosis of liver with ascites (HCC)   Psoas abscess (HCC)   . feeding supplement (ENSURE ENLIVE)  237 mL Oral TID BM  . multivitamin with minerals  1 tablet Oral Daily  . oxyCODONE  30 mg Oral Q12H  . polyethylene glycol  17 g Oral Daily  . senna-docusate  1 tablet Oral BID  . traZODone  50 mg Oral QHS    SUBJECTIVE:  [name] has remained afebrile overnight with no leukocytosis. Continues to have back pain with pain medication slowly improving his symptoms. Denies any fevers, chills or sweats. Has used IV drugs to help with his pain control prior to admission. Uses clean needles and does not share needles or cookers with anyone. Has cleansed the skin prior to  injection.   No Known Allergies   Review of Systems: Review of Systems  Constitutional: Negative for chills and fever.  Respiratory: Negative for cough, shortness of breath and wheezing.   Cardiovascular: Negative for chest pain and leg swelling.  Gastrointestinal: Negative for abdominal pain, diarrhea, nausea and vomiting.  Musculoskeletal: Positive for back pain.  Skin: Negative for rash.  Psychiatric/Behavioral: Positive for substance abuse.      OBJECTIVE: Vitals:   10/02/18 0610 10/02/18 1508 10/02/18 1912 10/03/18 0446  BP: 117/77 137/88 119/78 106/80  Pulse: 88 78 90 85  Resp:      Temp:  98.3 F (36.8 C) 98.3 F (36.8 C) 98.6 F (37 C)  TempSrc:  Oral Oral Oral  SpO2: 100% 98% 98% 99%  Weight:      Height:       Body mass index is 22.38 kg/m.  Physical Exam  Constitutional: He is oriented to person, place, and time. He appears well-developed and well-nourished. No distress.  Cardiovascular: Normal rate, regular rhythm, normal heart sounds and intact distal pulses.  Pulmonary/Chest: Effort normal and breath sounds normal.  Neurological: He is alert and oriented to person, place, and time.  Skin: Skin is warm and dry.  Psychiatric: He has a normal mood and affect.    Lab Results Lab Results  Component Value Date   WBC 4.0 10/03/2018   HGB 9.4 (L) 10/03/2018   HCT 30.6 (L) 10/03/2018   MCV  84.3 10/03/2018   PLT 83 (L) 10/03/2018    Lab Results  Component Value Date   CREATININE 0.89 10/03/2018   BUN 25 (H) 10/03/2018   NA 134 (L) 10/03/2018   K 4.8 10/03/2018   CL 103 10/03/2018   CO2 25 10/03/2018    Lab Results  Component Value Date   ALT 13 10/01/2018   AST 16 10/01/2018   ALKPHOS 122 10/01/2018   BILITOT 0.3 10/01/2018     Microbiology: Recent Results (from the past 240 hour(s))  Blood Culture (routine x 2)     Status: None (Preliminary result)   Collection Time: 09/29/18 11:32 PM  Result Value Ref Range Status   Specimen  Description   Final    BLOOD LEFT HAND Performed at [facility], [address], [place], [place] [address]    Special Requests   Final    BOTTLES DRAWN AEROBIC AND ANAEROBIC Blood Culture results may not be optimal due to an inadequate volume of blood received in culture bottles Performed at [facility], [address], [place], [place] [address]    Culture   Final    NO GROWTH 3 DAYS Performed at [facility], [address], [place], [place] [address]    Report Status PENDING  Incomplete  Blood Culture (routine x 2)     Status: None (Preliminary result)   Collection Time: 09/29/18 11:32 PM  Result Value Ref Range Status   Specimen Description   Final    BLOOD RIGHT FOREARM Performed at [facility], [address], [place], [place] [address]    Special Requests   Final    BOTTLES DRAWN AEROBIC AND ANAEROBIC Blood Culture results may not be optimal due to an inadequate volume of blood received in culture bottles Performed at [facility], [address], [place], [place] [address]    Culture   Final    NO GROWTH 3 DAYS Performed at [facility], [address] [place], [place] [address]    Report Status PENDING  Incomplete  Urine culture     Status: Abnormal   Collection Time: 09/29/18 11:32 PM  Result Value Ref Range Status   Specimen Description   Final    URINE, CLEAN CATCH Performed at [facility], [address] [place], [place] [address]    Special Requests   Final    NONE Performed at [facility], [address] [place], [place] [address]    Culture >=100,000 COLONIES/mL SERRATIA MARCESCENS (A)  Final   Report Status 10/02/2018 FINAL  Final   Organism ID, Bacteria SERRATIA MARCESCENS (A)  Final      Susceptibility   Serratia marcescens - MIC*    CEFAZOLIN >=64 RESISTANT Resistant     CEFTRIAXONE <=1 SENSITIVE Sensitive     CIPROFLOXACIN <=0.25 SENSITIVE  Sensitive     GENTAMICIN <=1 SENSITIVE Sensitive     NITROFURANTOIN 128 RESISTANT Resistant     TRIMETH/SULFA <=20 SENSITIVE Sensitive     * >=100,000 COLONIES/mL SERRATIA MARCESCENS  Aerobic/Anaerobic Culture (surgical/deep wound)     Status: None (Preliminary result)   Collection Time: 10/01/18 10:26 AM  Result Value Ref Range Status   Specimen Description ABSCESS VERTEBRA L3 L4  Final   Special Requests NONE  Final   Gram Stain   Final    FEW WBC PRESENT, PREDOMINANTLY PMN NO ORGANISMS SEEN Performed at [facility], [address] [place], [place] [address]    Culture   Final    FEW SERRATIA MARCESCENS NO ANAEROBES ISOLATED; CULTURE IN PROGRESS FOR 5 DAYS    Report Status PENDING  Incomplete   Organism ID, Bacteria SERRATIA MARCESCENS  Final      Susceptibility   Serratia marcescens - MIC*    CEFAZOLIN >=64 RESISTANT Resistant     CEFEPIME <=1 SENSITIVE Sensitive     CEFTAZIDIME <=1 SENSITIVE Sensitive     CEFTRIAXONE <=1 SENSITIVE Sensitive     CIPROFLOXACIN <=0.25 SENSITIVE Sensitive     GENTAMICIN <=1 SENSITIVE Sensitive     TRIMETH/SULFA <=20 SENSITIVE Sensitive     * FEW SERRATIA MARCESCENS     Marcos Eke, NP [facility] [phone] Pager  10/03/2018  1:20 PM

## 2018-10-03 NOTE — Progress Notes (Signed)
[facility] Infusion Coordinator will follow Mr. Rasnick with ID team to support home infusion pharmacy services for home IV ABX as ordered at DC to home.  If patient discharges after hours, please call [phone].   Sedalia Muta 10/03/2018, 8:49 AM

## 2018-10-03 NOTE — Progress Notes (Signed)
PROGRESS NOTE  Dan [name]  ZOX:[id number] DOB: May 29, 1971 DOA: 09/29/2018 PCP: [name], NP   Brief Narrative: Dan [name] is a 47 y.o. 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 He was afebrile with normal WBC but imaging demonstrated thoracolumbar discitis/osteomyelitis at multiple levels with marked collapse/retropulsion of vertebral body at L3-4 with severe spinal stenosis and abscess extending to bilateral psoas muscles. Neurosurgery was consulted, though the patient has no myelopathic symptoms or signs and medical management was recommended. ID was consulted, recommended vancomycin and ceftriaxone following abscess sampling which was performed 10/19.  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 ?Hematogenous spread but NG on blood Cx. With IVDU, will need TEE per discussion with ID.  - Continue to monitor blood cultures - Consulted cardiology for TEE this week. TTE no vegetations. - Continue abx per ID based on susceptibility data.   L3-4 vertebral body collapse, retropulsion causing severe spinal stenosis: No myelopathy. Neurosurgery consulted, recommending IV antibiotics, no surgery.  - PT/OT - Pain control: Significant tolerance, so increased  oxycodone prn and no sedation noted with low dose oxy ER. Will increase oxyER today and monitor closely. No IV analgesics required. Bowel regimen also ordered. - Abx as above  Liver cirrhosis with ascites, portal hypertensive gastropathy, history of bleeding gastric ulcer, thrombocytopenia: CT abd/pelvis without acute findings and not very tender on exam. Mentating normally and no evidence of GI bleeding.  - Monitor mental status, abdominal exam, CBC closely.  - Avoid tylenol. Tramadol discontinued.    Polysubstance abuse, IVDU:  - Cessation counseling provided.  - Will NOT be a candidate for home health IV antibiotics.  Hepatitis B and C:  - Follow up as outpatient  Moderate protein calorie malnutrition:  - Protein supplemented  Anemia of chronic disease:  - Monitor  DVT prophylaxis: SCDs Code Status: Full Family Communication: Neighbor at bedside per patient's request. Disposition Plan: Uncertain, will require prolonged IV antibiotics, TEE.  Consultants:   Neurosurgery, Dr. Maurice Small  Infectious Diseases, Dr. Drue Second  Interventional radiology, Dr. Loreta Ave  Procedures:   Imaging guided L3-L4 disc aspiration for sample 10/01/2018 by Dr. Loreta Ave.  Echocardiogram 10/01/2018: - Left ventricle: The cavity size was normal. Baroni thickness was   normal. Systolic function was normal. The estimated ejection   fraction was in the range of 60% to 65%. Madia motion was normal;   there were no regional Fandrich motion abnormalities. Left   ventricular diastolic function parameters were normal. - Mitral valve: Mildly thickened leaflets . There was trace to mild   regurgitation. - Left atrium: The atrium was normal in size. - Inferior vena cava: The vessel was normal in size. The   respirophasic diameter changes were in the normal range (= 50%),   consistent with  normal central venous pressure.  Impressions:  - Compared to a prior study in 2017, there have been no changes. No    vegetation is noted.  Antimicrobials:  Vancomycin 10/18 >>   Zosyn 10/17  Ceftriaxone 10/19 >>    Subjective: Pain remains at 9/10 despite pain medications. Toradol not helpful at all. Has not slept since arrival and has not gotten out of bed since arrival due to the pain. Appears uncomfortable.  Objective: Vitals:   10/02/18 0610 10/02/18 1508 10/02/18 1912 10/03/18 0446  BP: 117/77 137/88 119/78 106/80  Pulse: 88 78 90 85  Resp:      Temp:  98.3 F (36.8 C) 98.3 F (36.8 C) 98.6 F (37 C)  TempSrc:  Oral Oral Oral  SpO2: 100% 98% 98% 99%  Weight:      Height:        Intake/Output Summary (Last 24 hours) at 10/03/2018 1106 Last data filed at 10/03/2018 1002 Gross per 24 hour  Intake -  Output 1530 ml  Net -1530 ml   Filed Weights   09/29/18 1828 10/01/18 1157  Weight: 63.5 kg 72.8 kg   Gen: Chronically ill-appearing male in no distress Pulm: Nonlabored breathing room air. Clear. CV: Regular rate and rhythm. No murmur, rub, or gallop. No JVD, no dependent edema. GI: Abdomen more soft, not tender, not distended, +BS. MSK: Refuses back exam, reports unable to flex back without severe pain.  Skin: No rashes, lesions or ulcers on visualized skin.  Neuro: Alert and oriented. Legs with 5/5 strength, intact sensation. No focal neurological deficits. Psych: Judgement and insight appear fair. Behavior is appropriate.    Data Reviewed: I have personally reviewed following labs and imaging studies  CBC: Recent Labs  Lab 09/29/18 2235 10/01/18 0449 10/03/18 0513  WBC 5.5 4.4 4.0  HGB 10.5* 9.5* 9.4*  HCT 34.7* 30.9* 30.6*  MCV 84.8 84.0 84.3  PLT 100* 88* 83*   Basic Metabolic Panel: Recent Labs  Lab 09/29/18 2235 10/01/18 0449 10/03/18 0513  NA 137 136 134*  K 3.9 4.3 4.8  CL 102 107 103  CO2 29 22 25   GLUCOSE 105* 114* 144*  BUN 19 12 25*  CREATININE 0.67 0.80 0.89  CALCIUM 8.0* 7.7* 7.9*   GFR: Estimated Creatinine Clearance: 103.4 mL/min (by C-G  formula based on SCr of 0.89 mg/dL). Liver Function Tests: Recent Labs  Lab 09/29/18 2235 10/01/18 0449  AST 18 16  ALT 14 13  ALKPHOS 137* 122  BILITOT 0.4 0.3  PROT 7.3 6.1*  ALBUMIN 2.2* 1.9*   Recent Labs  Lab 09/29/18 2235  LIPASE 42   No results for input(s): AMMONIA in the last 168 hours. Coagulation Profile: Recent Labs  Lab 09/30/18 1618  INR 1.24   Cardiac Enzymes: No results for input(s): CKTOTAL, CKMB, CKMBINDEX, TROPONINI in the last 168 hours. BNP (last 3 results) No results for input(s): PROBNP in the last 8760 hours. HbA1C: No results for input(s): HGBA1C in the last 72 hours. CBG: No results for input(s): GLUCAP in the last 168 hours. Lipid Profile: No results for input(s): CHOL, HDL, LDLCALC, TRIG, CHOLHDL, LDLDIRECT in the last 72 hours. Thyroid Function Tests: No results for input(s): TSH, T4TOTAL, FREET4, T3FREE, THYROIDAB in the last 72 hours. Anemia Panel: No results for input(s): VITAMINB12, FOLATE, FERRITIN, TIBC, IRON, RETICCTPCT in the last 72 hours. Urine analysis:    Component Value Date/Time   COLORURINE AMBER (A) 09/29/2018 2332   APPEARANCEUR HAZY (A) 09/29/2018 2332  LABSPEC 1.021 09/29/2018 2332   PHURINE 7.0 09/29/2018 2332   GLUCOSEU NEGATIVE 09/29/2018 2332   HGBUR NEGATIVE 09/29/2018 2332   BILIRUBINUR NEGATIVE 09/29/2018 2332   KETONESUR NEGATIVE 09/29/2018 2332   PROTEINUR NEGATIVE 09/29/2018 2332   NITRITE NEGATIVE 09/29/2018 2332   LEUKOCYTESUR NEGATIVE 09/29/2018 2332   Recent Results (from the past 240 hour(s))  Blood Culture (routine x 2)     Status: None (Preliminary result)   Collection Time: 09/29/18 11:32 PM  Result Value Ref Range Status   Specimen Description   Final    BLOOD LEFT HAND Performed at [facility], [address] [place], [place] [address]    Special Requests   Final    BOTTLES DRAWN AEROBIC AND ANAEROBIC Blood Culture results may not be optimal due to an inadequate volume of blood  received in culture bottles Performed at [facility], [address], [place], [place] [address]    Culture   Final    NO GROWTH 3 DAYS Performed at [facility], [address], [place], [place] [address]    Report Status PENDING  Incomplete  Blood Culture (routine x 2)     Status: None (Preliminary result)   Collection Time: 09/29/18 11:32 PM  Result Value Ref Range Status   Specimen Description   Final    BLOOD RIGHT FOREARM Performed at [facility], [address] [place], [place] [address]    Special Requests   Final    BOTTLES DRAWN AEROBIC AND ANAEROBIC Blood Culture results may not be optimal due to an inadequate volume of blood received in culture bottles Performed at [facility], [address] [place], [place] [address]    Culture   Final    NO GROWTH 3 DAYS Performed at [facility], [address], [place], [place] [address]    Report Status PENDING  Incomplete  Urine culture     Status: Abnormal   Collection Time: 09/29/18 11:32 PM  Result Value Ref Range Status   Specimen Description   Final    URINE, CLEAN CATCH Performed at [facility], [address] [place], [place] [address]    Special Requests   Final    NONE Performed at [facility], [address], [place], [place] [address]    Culture >=100,000 COLONIES/mL SERRATIA MARCESCENS (A)  Final   Report Status 10/02/2018 FINAL  Final   Organism ID, Bacteria SERRATIA MARCESCENS (A)  Final      Susceptibility   Serratia marcescens - MIC*    CEFAZOLIN >=64 RESISTANT Resistant     CEFTRIAXONE <=1 SENSITIVE Sensitive     CIPROFLOXACIN <=0.25 SENSITIVE Sensitive     GENTAMICIN <=1 SENSITIVE Sensitive     NITROFURANTOIN 128 RESISTANT Resistant     TRIMETH/SULFA <=20 SENSITIVE Sensitive     * >=100,000 COLONIES/mL SERRATIA MARCESCENS  Aerobic/Anaerobic Culture (surgical/deep wound)     Status: None (Preliminary  result)   Collection Time: 10/01/18 10:26 AM  Result Value Ref Range Status   Specimen Description ABSCESS VERTEBRA L3 L4  Final   Special Requests NONE  Final   Gram Stain   Final    FEW WBC PRESENT, PREDOMINANTLY PMN NO ORGANISMS SEEN Performed at [facility], [address] [place], [place] [address]    Culture   Final    FEW SERRATIA MARCESCENS NO ANAEROBES ISOLATED; CULTURE IN PROGRESS FOR 5 DAYS  Report Status PENDING  Incomplete   Organism ID, Bacteria SERRATIA MARCESCENS  Final      Susceptibility   Serratia marcescens - MIC*    CEFAZOLIN >=64 RESISTANT Resistant     CEFEPIME <=1 SENSITIVE Sensitive     CEFTAZIDIME <=1 SENSITIVE Sensitive     CEFTRIAXONE <=1 SENSITIVE Sensitive     CIPROFLOXACIN <=0.25 SENSITIVE Sensitive     GENTAMICIN <=1 SENSITIVE Sensitive     TRIMETH/SULFA <=20 SENSITIVE Sensitive     * FEW SERRATIA MARCESCENS      Radiology Studies: No results found.  Scheduled Meds: . feeding supplement (ENSURE ENLIVE)  237 mL Oral TID BM  . multivitamin with minerals  1 tablet Oral Daily  . oxyCODONE  30 mg Oral Q12H  . traZODone  50 mg Oral QHS   Continuous Infusions: . cefTRIAXone (ROCEPHIN)  IV 2 g (10/02/18 1919)     LOS: 3 days   Time spent: 35 minutes.  Tyrone Nine, MD [facility] [email or web address] Password [email or web address] 10/03/2018, 11:06 AM

## 2018-10-03 NOTE — Plan of Care (Signed)
Problem: Education: Goal: Knowledge of General Education information will improve Description Including pain rating scale, medication(s)/side effects and non-pharmacologic comfort measures Outcome: Progressing   Problem: Clinical Measurements: Goal: Diagnostic test results will improve Outcome: Progressing   Problem: Activity: Goal: Risk for activity intolerance will decrease Outcome: Progressing   Problem: Nutrition: Goal: Adequate nutrition will be maintained Outcome: Progressing   Problem: Coping: Goal: Level of anxiety will decrease Outcome: Progressing   Problem: Elimination: Goal: Will not experience complications related to urinary retention Outcome: Progressing   Problem: Pain Managment: Goal: General experience of comfort will improve Outcome: Progressing   Problem: Safety: Goal: Ability to remain free from injury will improve Outcome: Progressing   Problem: Skin Integrity: Goal: Risk for impaired skin integrity will decrease Outcome: Progressing   

## 2018-10-04 MED ORDER — TRAZODONE HCL 100 MG PO TABS
100.0000 mg | ORAL_TABLET | Freq: Every day | ORAL | Status: DC
  Administered 2018-10-04 – 2018-10-11 (×8): 100 mg via ORAL
  Filled 2018-10-04 (×9): qty 1

## 2018-10-04 NOTE — Plan of Care (Signed)
  Problem: Education: Goal: Knowledge of General Education information will improve Description: Including pain rating scale, medication(s)/side effects and non-pharmacologic comfort measures Outcome: Progressing   Problem: Clinical Measurements: Goal: Respiratory complications will improve Outcome: Progressing   Problem: Nutrition: Goal: Adequate nutrition will be maintained Outcome: Progressing   Problem: Coping: Goal: Level of anxiety will decrease Outcome: Progressing   Problem: Elimination: Goal: Will not experience complications related to urinary retention Outcome: Progressing   Problem: Pain Managment: Goal: General experience of comfort will improve Outcome: Progressing   Problem: Safety: Goal: Ability to remain free from injury will improve Outcome: Progressing   Problem: Skin Integrity: Goal: Risk for impaired skin integrity will decrease Outcome: Progressing   

## 2018-10-04 NOTE — Evaluation (Signed)
Occupational Therapy Evaluation Patient Details Name: Dan [name] MRN: [id number] DOB: September 18, 1971 Today's Date: 10/04/2018    History of Present Illness 47 yo male presented with back pain progressively worsening over the past month causing severe debilitation which he started using IV heroin to treat. Pt afebrile with normal WBC but imaging demonstrated thoracolumbar discitis/osteomyelitis at multiple levels with marked collapse/retropulsion of vertebral body at L3-4 with severe spinal stenosis and abscess extending to bilateral psoas muscles. Neurosurgery consulted and recommend non-surgical approach with IV antibiotics. 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     Clinical Impression   PTA, pt was living with his mother and was independent. Pt currently requiring supervision for UB ADLs, Min-Max A for LB ADLs, and Min A for bed mobility. Pt declining OOB activity due to 10/10 pain. Providing education on bed mobility and back precautions to decrease pain. Pt would benefit from further acute OT to facilitate safe dc. Pending pt progress, recommend dc to home with HHOT for further OT to optimize safety, independence with ADLs, and return to PLOF.      Follow Up Recommendations  Home health OT;Supervision/Assistance - 24 hour(Pending pt progress)    Equipment Recommendations  3 in 1 bedside commode    Recommendations for Other Services PT consult     Precautions / Restrictions Precautions Precautions: Fall;Back Precaution Booklet Issued: No Precaution Comments: Educating pt on back precautions for pain management.  Restrictions Weight Bearing Restrictions: No      Mobility Bed Mobility Overal bed mobility: Needs Assistance Bed Mobility: Rolling;Sidelying to Sit;Sit to Sidelying Rolling: Min guard Sidelying to sit: Min assist     Sit to  sidelying: Min guard General bed mobility comments: Educating pt on log roll technique. Min A for bringing hips towards EOB and facilitate pushing up into sitting  Transfers                 General transfer comment: Defered due to pain    Balance Overall balance assessment: Needs assistance Sitting-balance support: Bilateral upper extremity supported;Feet supported Sitting balance-Leahy Scale: Fair                                     ADL either performed or assessed with clinical judgement   ADL Overall ADL's : Needs assistance/impaired Eating/Feeding: Set up;Sitting   Grooming: Set up;Sitting   Upper Body Bathing: Set up;Supervision/ safety;Sitting   Lower Body Bathing: Minimal assistance;Sitting/lateral leans;Bed level   Upper Body Dressing : Set up;Supervision/safety   Lower Body Dressing: Maximal assistance;Bed level;Sitting/lateral leans Lower Body Dressing Details (indicate cue type and reason): Unable to bend foward due to pain. Would benefit from education AE               General ADL Comments: Pt limited by back pain. Educating pt on sleep positions to decrease pain     Vision         Perception     Praxis      Pertinent Vitals/Pain Pain Assessment: 0-10 Pain Score: 10-Worst pain ever Pain Location: Back Pain Descriptors / Indicators: Constant;Discomfort;Grimacing Pain Intervention(s): Monitored during session;Limited activity within patient's tolerance;Repositioned;Premedicated before session     Hand Dominance     Extremity/Trunk Assessment Upper Extremity Assessment Upper Extremity Assessment: Overall WFL for tasks  assessed   Lower Extremity Assessment Lower Extremity Assessment: Defer to PT evaluation   Cervical / Trunk Assessment Cervical / Trunk Assessment: Other exceptions Cervical / Trunk Exceptions: imaging demonstrated thoracolumbar discitis/osteomyelitis at multiple levels with marked collapse/retropulsion of  vertebral body at L3-4 with severe spinal stenosis and abscess   Communication Communication Communication: No difficulties   Cognition Arousal/Alertness: Awake/alert Behavior During Therapy: WFL for tasks assessed/performed Overall Cognitive Status: Within Functional Limits for tasks assessed                                 General Comments: WFL for tasks completed   General Comments       Exercises     Shoulder Instructions      Home Living Family/patient expects to be discharged to:: Private residence Living Arrangements: Parent Available Help at Discharge: Available PRN/intermittently Type of Home: Mobile home       Home Layout: One level     Bathroom Shower/Tub: Chief Strategy Officer: Standard     Home Equipment: None   Additional Comments: Lives with mother      Prior Functioning/Environment Level of Independence: Independent                 OT Problem List: Decreased range of motion;Decreased activity tolerance;Impaired balance (sitting and/or standing);Decreased knowledge of use of DME or AE;Decreased knowledge of precautions;Pain      OT Treatment/Interventions: Self-care/ADL training;Therapeutic exercise;Energy conservation;DME and/or AE instruction;Therapeutic activities;Patient/family education    OT Goals(Current goals can be found in the care plan section) Acute Rehab OT Goals Patient Stated Goal: "Stop pain" OT Goal Formulation: With patient Time For Goal Achievement: 10/18/18 Potential to Achieve Goals: Good  OT Frequency: Min 2X/week   Barriers to D/C:            Co-evaluation              AM-PAC PT "6 Clicks" Daily Activity     Outcome Measure Help from another person eating meals?: None Help from another person taking care of personal grooming?: A Little Help from another person toileting, which includes using toliet, bedpan, or urinal?: A Little Help from another person bathing (including  washing, rinsing, drying)?: A Lot Help from another person to put on and taking off regular upper body clothing?: A Little Help from another person to put on and taking off regular lower body clothing?: A Lot 6 Click Score: 17   End of Session Nurse Communication: Mobility status;Precautions  Activity Tolerance: Patient tolerated treatment well Patient left: in bed;with call bell/phone within reach;with bed alarm set  OT Visit Diagnosis: Unsteadiness on feet (R26.81);Other abnormalities of gait and mobility (R26.89);Muscle weakness (generalized) (M62.81);Pain Pain - part of body: (Back)                Time: 4098-1191 OT Time Calculation (min): 13 min Charges:  OT General Charges $OT Visit: 1 Visit OT Evaluation $OT Eval Moderate Complexity: 1 Mod  Ziquan Fidel MSOT, OTR/L Acute Rehab Pager: [phone] Office: [phone]  Theodoro Grist Leshawn Straka 10/04/2018, 4:24 PM

## 2018-10-04 NOTE — Progress Notes (Signed)
[facility]  Date of Admission:  09/29/2018     Total days of antibiotics 6         ASSESSMENT/PLAN  [name] is on Day 6 of antimicrobial therapy for Serratia marcescens lumbar discitis now with ceftriaxone. TTE is without vegetation and TEE scheduled for 10/25. Remained afebrile and clinically appears to be stable and improving. Hepatitis B and C blood work remains pending. He will need prolonged therapy for discitis with plans for conversion to oral medication (Ciprofloxacin or levofloxacin) at discharge.  1. Continue current dose of ceftriaxone.  2. Await TEE results.  3. May consider heating pad as needed for pain.    Principal Problem:   Discitis of thoracolumbar region Active Problems:   Other cirrhosis of liver (HCC)   Chronic hepatitis B (HCC)   Chronic hepatitis C without hepatic coma (HCC)   IV drug abuse (HCC)   Malnutrition of moderate degree   Alcoholic cirrhosis of liver with ascites (HCC)   Psoas abscess (HCC)   . feeding supplement (ENSURE ENLIVE)  237 mL Oral TID BM  . multivitamin with minerals  1 tablet Oral Daily  . oxyCODONE  30 mg Oral Q12H  . polyethylene glycol  17 g Oral Daily  . senna-docusate  1 tablet Oral BID  . traZODone  50 mg Oral QHS    SUBJECTIVE:  Afebrile overnight.   Continues to have back pain in the lumbar region. Attempted to get out of bed earlier and experienced a "lightning type" pain from his back to the front of his hip.    No Known Allergies   Review of Systems: Review of Systems  Constitutional: Negative for chills and fever.  Respiratory: Negative for cough, shortness of breath and wheezing.   Gastrointestinal: Negative for abdominal pain, constipation, diarrhea, nausea and vomiting.  Genitourinary: Negative for dysuria, flank pain, frequency and urgency.  Skin: Negative for rash.  Neurological: Negative for focal weakness and weakness.      OBJECTIVE: Vitals:   10/03/18 0446  10/03/18 1445 10/03/18 2030 10/04/18 0529  BP: 106/80 112/70 116/75 (!) 105/58  Pulse: 85 87 85 95  Resp:  14 14 14   Temp: 98.6 F (37 C) (!) 97.5 F (36.4 C) 97.9 F (36.6 C) 98.2 F (36.8 C)  TempSrc: Oral Oral Oral Oral  SpO2: 99% 98% 98% 95%  Weight:      Height:       Body mass index is 22.38 kg/m.  Physical Exam  Constitutional: He is oriented to person, place, and time. He appears well-developed and well-nourished. No distress.  Lying in bed with head of bed elevated; pleasant.   Cardiovascular: Normal rate, regular rhythm, normal heart sounds and intact distal pulses. Exam reveals no gallop and no friction rub.  No murmur heard. Pulmonary/Chest: Effort normal and breath sounds normal. No stridor. No respiratory distress. He has no wheezes. He has no rales. He exhibits no tenderness.  Neurological: He is alert and oriented to person, place, and time.  Skin: Skin is warm and dry.  Psychiatric: He has a normal mood and affect. His behavior is normal. Judgment and thought content normal.    Lab Results Lab Results  Component Value Date   WBC 4.0 10/03/2018   HGB 9.4 (L) 10/03/2018   HCT 30.6 (L) 10/03/2018   MCV 84.3 10/03/2018   PLT 83 (L) 10/03/2018    Lab Results  Component Value Date   CREATININE 0.89 10/03/2018  BUN 25 (H) 10/03/2018   NA 134 (L) 10/03/2018   K 4.8 10/03/2018   CL 103 10/03/2018   CO2 25 10/03/2018    Lab Results  Component Value Date   ALT 13 10/01/2018   AST 16 10/01/2018   ALKPHOS 122 10/01/2018   BILITOT 0.3 10/01/2018     Microbiology: Recent Results (from the past 240 hour(s))  Blood Culture (routine x 2)     Status: None (Preliminary result)   Collection Time: 09/29/18 11:32 PM  Result Value Ref Range Status   Specimen Description   Final    BLOOD LEFT HAND Performed at [facility], [address], [place], [place] [address]    Special Requests   Final    BOTTLES DRAWN AEROBIC AND ANAEROBIC Blood Culture results  may not be optimal due to an inadequate volume of blood received in culture bottles Performed at [facility], [address], [place], [place] [address]    Culture   Final    NO GROWTH 4 DAYS Performed at [facility], [address], [place], [place] [address]    Report Status PENDING  Incomplete  Blood Culture (routine x 2)     Status: None (Preliminary result)   Collection Time: 09/29/18 11:32 PM  Result Value Ref Range Status   Specimen Description   Final    BLOOD RIGHT FOREARM Performed at [facility], [address], [place], [place] [address]    Special Requests   Final    BOTTLES DRAWN AEROBIC AND ANAEROBIC Blood Culture results may not be optimal due to an inadequate volume of blood received in culture bottles Performed at [facility], [address], [place], [place] [address]    Culture   Final    NO GROWTH 4 DAYS Performed at [facility], [address] [place], [place] [address]    Report Status PENDING  Incomplete  Urine culture     Status: Abnormal   Collection Time: 09/29/18 11:32 PM  Result Value Ref Range Status   Specimen Description   Final    URINE, CLEAN CATCH Performed at [facility], [address], [place], [place] [address]    Special Requests   Final    NONE Performed at [facility], [address], [place], [place] [address]    Culture >=100,000 COLONIES/mL SERRATIA MARCESCENS (A)  Final   Report Status 10/02/2018 FINAL  Final   Organism ID, Bacteria SERRATIA MARCESCENS (A)  Final      Susceptibility   Serratia marcescens - MIC*    CEFAZOLIN >=64 RESISTANT Resistant     CEFTRIAXONE <=1 SENSITIVE Sensitive     CIPROFLOXACIN <=0.25 SENSITIVE Sensitive     GENTAMICIN <=1 SENSITIVE Sensitive     NITROFURANTOIN 128 RESISTANT Resistant     TRIMETH/SULFA <=20 SENSITIVE Sensitive     * >=100,000 COLONIES/mL SERRATIA MARCESCENS  Aerobic/Anaerobic  Culture (surgical/deep wound)     Status: None (Preliminary result)   Collection Time: 10/01/18 10:26 AM  Result Value Ref Range Status   Specimen Description ABSCESS VERTEBRA L3 L4  Final   Special Requests NONE  Final   Gram Stain   Final    FEW WBC PRESENT, PREDOMINANTLY PMN NO ORGANISMS SEEN Performed at [facility], [address], [place], [place] [address]    Culture   Final    FEW SERRATIA MARCESCENS NO ANAEROBES ISOLATED; CULTURE IN  PROGRESS FOR 5 DAYS    Report Status PENDING  Incomplete   Organism ID, Bacteria SERRATIA MARCESCENS  Final      Susceptibility   Serratia marcescens - MIC*    CEFAZOLIN >=64 RESISTANT Resistant     CEFEPIME <=1 SENSITIVE Sensitive     CEFTAZIDIME <=1 SENSITIVE Sensitive     CEFTRIAXONE <=1 SENSITIVE Sensitive     CIPROFLOXACIN <=0.25 SENSITIVE Sensitive     GENTAMICIN <=1 SENSITIVE Sensitive     TRIMETH/SULFA <=20 SENSITIVE Sensitive     * FEW SERRATIA MARCESCENS     Marcos Eke, NP [facility] [phone] Pager  10/04/2018  12:11 PM

## 2018-10-04 NOTE — Progress Notes (Signed)
    [facility] has been requested to perform a transesophageal echocardiogram on 10/07/18 for bacteremia.  After careful review of history and examination, the risks and benefits of transesophageal echocardiogram have been explained including risks of esophageal damage, perforation (1:10,000 risk), bleeding, pharyngeal hematoma as well as other potential complications associated with conscious sedation including aspiration, arrhythmia, respiratory failure and death. Alternatives to treatment were discussed, questions were answered. Patient is willing to proceed.   TEE scheduled for 10/07/18 at 8:00am with Dr. Leitha Schuller, PA-C 10/04/2018 3:03 PM

## 2018-10-04 NOTE — Progress Notes (Signed)
PROGRESS NOTE  Dan [name]  GNF:[id number] DOB: 1971-08-11 DOA: 09/29/2018 PCP: [name], NP   Brief Narrative: Dan [name] is a 47 y.o. 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 He was afebrile with normal WBC but imaging demonstrated thoracolumbar discitis/osteomyelitis at multiple levels with marked collapse/retropulsion of vertebral body at L3-4 with severe spinal stenosis and abscess extending to bilateral psoas muscles. Neurosurgery was consulted, though the patient has no myelopathic symptoms or signs and medical management was recommended. ID was consulted, recommended vancomycin and ceftriaxone following abscess sampling which was performed 10/19. Abscess and urine cultures grew Serratia, though blood cultures oddly remain negative. Echocardiogram without vegetation, TEE planned 10/24.   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 ?Hematogenous spread but NG on blood Cx. With IVDU, will need TEE per discussion with ID.  - Continue to monitor blood cultures - Consulted cardiology for TEE this week, reportedly will be 10/24. TTE no vegetations. - Continue abx per ID based on susceptibility data. Anticipating 6-8 weeks fluoroquinolone for discitis.  L3-4  vertebral body collapse, retropulsion causing severe spinal stenosis: No myelopathy. Neurosurgery consulted, recommending IV antibiotics, no surgery.  - PT/OT - Pain control: Significant tolerance, so on oxycontin 30mg  q12h and oxyIR 10-20mg  q3h prn mod/sev pain. Discussed with patient that we anticipate deescalation as infection is treated.  - No IV analgesics required.  - Bowel regimen also ordered. - Abx as above  Liver cirrhosis with ascites, portal hypertensive gastropathy, history of bleeding gastric ulcer, thrombocytopenia: CT abd/pelvis without acute findings and not very tender on exam. Mentating normally and no evidence of GI bleeding.  - Monitor mental status, abdominal exam, CBC closely. Stable. - Avoid tylenol.   Polysubstance abuse, IVDU:  - Cessation counseling provided.  - Will NOT be a candidate for home health IV antibiotics.  Hepatitis B and C:  - Follow up as outpatient  Moderate protein calorie malnutrition:  - Protein supplemented  Anemia of chronic disease:  - Monitor  DVT prophylaxis: SCDs Code Status: Full Family Communication: None at bedside this AM Disposition Plan: Uncertain, will require prolonged IV antibiotics, TEE. PT/OT eval.  Consultants:   Neurosurgery, Dr. Maurice Small  Infectious Diseases, Dr. Drue Second  Interventional radiology, Dr. Loreta Ave  Procedures:   Imaging guided L3-L4 disc aspiration for sample 10/01/2018 by Dr. Loreta Ave.  Echocardiogram 10/01/2018: - Left ventricle: The cavity size was normal. Savitz thickness was   normal. Systolic function was normal. The estimated ejection   fraction was in the range of 60% to 65%. Devincent motion was normal;   there were no regional Tozer motion abnormalities. Left   ventricular diastolic function parameters were normal. - Mitral valve: Mildly thickened leaflets . There was trace to mild   regurgitation. - Left atrium:  The atrium was normal in size. - Inferior vena cava: The vessel was normal in  size. The   respirophasic diameter changes were in the normal range (= 50%),   consistent with normal central venous pressure.  Impressions:  - Compared to a prior study in 2017, there have been no changes. No   vegetation is noted.  Antimicrobials:  Vancomycin 10/18 >>   Zosyn 10/17  Ceftriaxone 10/19 >>    Subjective: Did not sleep well last night due to pain. Has not gotten out of bed except to try to stand and had too severe of lower back pain radiating down both legs to do so. Denies any leg weakness/numbness, just pain. No fever, chest pain, dyspnea.  Objective: Vitals:   10/03/18 0446 10/03/18 1445 10/03/18 2030 10/04/18 0529  BP: 106/80 112/70 116/75 (!) 105/58  Pulse: 85 87 85 95  Resp:  14 14 14   Temp: 98.6 F (37 C) (!) 97.5 F (36.4 C) 97.9 F (36.6 C) 98.2 F (36.8 C)  TempSrc: Oral Oral Oral Oral  SpO2: 99% 98% 98% 95%  Weight:      Height:        Intake/Output Summary (Last 24 hours) at 10/04/2018 1253 Last data filed at 10/04/2018 1054 Gross per 24 hour  Intake 717 ml  Output 1775 ml  Net -1058 ml   Filed Weights   09/29/18 1828 10/01/18 1157  Weight: 63.5 kg 72.8 kg   Gen: Chronically ill-appearing male in no distress Pulm: Nonlabored breathing room air. Clear. CV: Regular rate and rhythm. No murmur, rub, or gallop. No JVD, no dependent edema. GI: Abdomen soft, slightly protuberant but nontender with normoactive bowel sounds.  MSK: No midline spinal tenderness, paraspinal spasm across lower back. No step offs.  Skin: No new rashes, lesions or ulcers on visualized skin. No erythema/discharge from aspiration on back. Neuro: Alert and oriented. No focal neurological deficits. No sedation. Psych: Judgement and insight appear fair. Mood euthymic & affect congruent. Behavior is appropriate.    Data Reviewed: I have personally reviewed following labs and imaging studies  CBC: Recent Labs  Lab 09/29/18 2235 10/01/18 0449 10/03/18 0513  WBC  5.5 4.4 4.0  HGB 10.5* 9.5* 9.4*  HCT 34.7* 30.9* 30.6*  MCV 84.8 84.0 84.3  PLT 100* 88* 83*   Basic Metabolic Panel: Recent Labs  Lab 09/29/18 2235 10/01/18 0449 10/03/18 0513  NA 137 136 134*  K 3.9 4.3 4.8  CL 102 107 103  CO2 29 22 25   GLUCOSE 105* 114* 144*  BUN 19 12 25*  CREATININE 0.67 0.80 0.89  CALCIUM 8.0* 7.7* 7.9*   GFR: Estimated Creatinine Clearance: 103.4 mL/min (by C-G formula based on SCr of 0.89 mg/dL). Liver Function Tests: Recent Labs  Lab 09/29/18 2235 10/01/18 0449  AST 18 16  ALT 14 13  ALKPHOS 137* 122  BILITOT 0.4 0.3  PROT 7.3 6.1*  ALBUMIN 2.2* 1.9*   Recent Labs  Lab 09/29/18 2235  LIPASE 42   No results for input(s): AMMONIA in the last 168 hours. Coagulation Profile: Recent Labs  Lab 09/30/18 1618  INR 1.24   Cardiac Enzymes: No results for input(s): CKTOTAL, CKMB, CKMBINDEX, TROPONINI in the last 168 hours. BNP (last 3 results) No results for input(s): PROBNP in the last 8760 hours. HbA1C: No results for input(s): HGBA1C in the last 72 hours. CBG: No results for input(s): GLUCAP in the last 168 hours. Lipid Profile: No results for input(s): CHOL, HDL, LDLCALC, TRIG,  CHOLHDL, LDLDIRECT in the last 72 hours. Thyroid Function Tests: No results for input(s): TSH, T4TOTAL, FREET4, T3FREE, THYROIDAB in the last 72 hours. Anemia Panel: No results for input(s): VITAMINB12, FOLATE, FERRITIN, TIBC, IRON, RETICCTPCT in the last 72 hours. Urine analysis:    Component Value Date/Time   COLORURINE AMBER (A) 09/29/2018 2332   APPEARANCEUR HAZY (A) 09/29/2018 2332   LABSPEC 1.021 09/29/2018 2332   PHURINE 7.0 09/29/2018 2332   GLUCOSEU NEGATIVE 09/29/2018 2332   HGBUR NEGATIVE 09/29/2018 2332   BILIRUBINUR NEGATIVE 09/29/2018 2332   KETONESUR NEGATIVE 09/29/2018 2332   PROTEINUR NEGATIVE 09/29/2018 2332   NITRITE NEGATIVE 09/29/2018 2332   LEUKOCYTESUR NEGATIVE 09/29/2018 2332   Recent Results (from the past 240 hour(s))    Blood Culture (routine x 2)     Status: None (Preliminary result)   Collection Time: 09/29/18 11:32 PM  Result Value Ref Range Status   Specimen Description   Final    BLOOD LEFT HAND Performed at [facility], [address] [place], [place] [address]    Special Requests   Final    BOTTLES DRAWN AEROBIC AND ANAEROBIC Blood Culture results may not be optimal due to an inadequate volume of blood received in culture bottles Performed at [facility], [address], [place], [place] [address]    Culture   Final    NO GROWTH 4 DAYS Performed at [facility], [address], [place], [place] [address]    Report Status PENDING  Incomplete  Blood Culture (routine x 2)     Status: None (Preliminary result)   Collection Time: 09/29/18 11:32 PM  Result Value Ref Range Status   Specimen Description   Final    BLOOD RIGHT FOREARM Performed at [facility], [address] [place], [place] [address]    Special Requests   Final    BOTTLES DRAWN AEROBIC AND ANAEROBIC Blood Culture results may not be optimal due to an inadequate volume of blood received in culture bottles Performed at [facility], [address] [place], [place] [address]    Culture   Final    NO GROWTH 4 DAYS Performed at [facility], [address], [place], [place] [address]    Report Status PENDING  Incomplete  Urine culture     Status: Abnormal   Collection Time: 09/29/18 11:32 PM  Result Value Ref Range Status   Specimen Description   Final    URINE, CLEAN CATCH Performed at [facility], [address] [place], [place] [address]    Special Requests   Final    NONE Performed at [facility], [address] [place], [place] [address]    Culture >=100,000 COLONIES/mL SERRATIA MARCESCENS (A)  Final   Report Status 10/02/2018 FINAL  Final   Organism ID, Bacteria SERRATIA MARCESCENS (A)  Final       Susceptibility   Serratia marcescens - MIC*    CEFAZOLIN >=64 RESISTANT Resistant     CEFTRIAXONE <=1 SENSITIVE Sensitive     CIPROFLOXACIN <=0.25 SENSITIVE Sensitive     GENTAMICIN <=1 SENSITIVE Sensitive     NITROFURANTOIN 128 RESISTANT Resistant     TRIMETH/SULFA <=20 SENSITIVE Sensitive     * >=100,000 COLONIES/mL SERRATIA MARCESCENS  Aerobic/Anaerobic Culture (surgical/deep wound)     Status: None (Preliminary result)   Collection Time: 10/01/18 10:26 AM  Result Value Ref Range Status   Specimen Description ABSCESS  VERTEBRA L3 L4  Final   Special Requests NONE  Final   Gram Stain   Final    FEW WBC PRESENT, PREDOMINANTLY PMN NO ORGANISMS SEEN Performed at [facility], [address] [place], [place] [address]    Culture   Final    FEW SERRATIA MARCESCENS NO ANAEROBES ISOLATED; CULTURE IN PROGRESS FOR 5 DAYS    Report Status PENDING  Incomplete   Organism ID, Bacteria SERRATIA MARCESCENS  Final      Susceptibility   Serratia marcescens - MIC*    CEFAZOLIN >=64 RESISTANT Resistant     CEFEPIME <=1 SENSITIVE Sensitive     CEFTAZIDIME <=1 SENSITIVE Sensitive     CEFTRIAXONE <=1 SENSITIVE Sensitive     CIPROFLOXACIN <=0.25 SENSITIVE Sensitive     GENTAMICIN <=1 SENSITIVE Sensitive     TRIMETH/SULFA <=20 SENSITIVE Sensitive     * FEW SERRATIA MARCESCENS      Radiology Studies: No results found.  Scheduled Meds: . feeding supplement (ENSURE ENLIVE)  237 mL Oral TID BM  . multivitamin with minerals  1 tablet Oral Daily  . oxyCODONE  30 mg Oral Q12H  . polyethylene glycol  17 g Oral Daily  . senna-docusate  1 tablet Oral BID  . traZODone  50 mg Oral QHS   Continuous Infusions: . cefTRIAXone (ROCEPHIN)  IV 2 g (10/03/18 1950)     LOS: 4 days   Time spent: 25 minutes.  Tyrone Nine, MD [facility] [email or web address] Password [facility] 10/04/2018, 12:53 PM

## 2018-10-04 NOTE — Evaluation (Signed)
Physical Therapy Evaluation Patient Details Name: Dan [name] MRN: [id number] DOB: 08-08-1971 Today's Date: 10/04/2018   History of Present Illness  47 yo male presented with back pain progressively worsening over the past month causing severe debilitation which he started using IV heroin to treat. 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 Neurosurgery consulted and recommend non-surgical approach with IV antibiotics. 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   Clinical Impression  Pt admitted secondary to problem above with deficits below. Pt with very poor tolerance for mobility secondary to pain and only able to tolerate short period in standing. Required mod A to stand at EOB. Pt voiced concerns about returning home and reports he has had multiple falls at home. Currently feel pt would benefit from short term SNF at d/c given mobility deficits and limitations, however, once pain controlled, pt may progress and be able to go home with [facility] services. Will continue to follow acutely to maximize functional mobility independence and safety.     Follow Up Recommendations SNF;Supervision/Assistance - 24 hour    Equipment Recommendations  None recommended by PT    Recommendations for Other Services       Precautions / Restrictions Precautions Precautions: Fall;Back Precaution Booklet Issued: No Precaution Comments: Reviewed back precautions for pain management. Pt unable to remember precautions from previous OT session.  Restrictions Weight Bearing Restrictions: No      Mobility  Bed Mobility Overal bed mobility: Needs Assistance Bed Mobility: Sidelying to Sit;Sit to Sidelying Rolling: Min  guard Sidelying to sit: Min assist     Sit to sidelying: Supervision General bed mobility comments: Min A For trunk elevation to come to sitting. Reviewed log roll technique. Pt already in sidelying so did not perform roll.   Transfers Overall transfer level: Needs assistance Equipment used: Rolling walker (2 wheeled) Transfers: Sit to/from Stand Sit to Stand: Mod assist         General transfer comment: Mod A for lift assist and steadying. Pt only able to tolerate brief period in standing secondary to increased pain.   Ambulation/Gait             General Gait Details: Deferred   Stairs            Wheelchair Mobility    Modified Rankin (Stroke Patients Only)       Balance Overall balance assessment: Needs assistance Sitting-balance support: Bilateral upper extremity supported;Feet supported Sitting balance-Leahy Scale: Fair Sitting balance - Comments: Bracing at EOB secondary to pain.    Standing balance support: Bilateral upper extremity supported Standing balance-Leahy Scale: Poor Standing balance comment: Heavy reliance on UE support                              Pertinent Vitals/Pain Pain Assessment: Faces Pain Score: 10-Worst pain ever Faces Pain Scale: Hurts whole lot Pain Location: Back Pain Descriptors / Indicators: Constant;Discomfort;Grimacing Pain Intervention(s): Limited activity within patient's tolerance;Monitored during session;Repositioned;Patient requesting pain meds-RN notified    Home Living Family/patient expects to be discharged to:: Private residence Living Arrangements: Parent Available Help at Discharge: Available PRN/intermittently Type of Home: Mobile home Home Access: Stairs to enter Entrance Stairs-Rails: Right Entrance Stairs-Number of Steps: 6 Home Layout:  One level Home Equipment: Walker - 2 wheels;Cane - single point;Wheelchair - manual Additional Comments: Lives with mother, and states she is unable to  physically assist.     Prior Function Level of Independence: Independent with assistive device(s)         Comments: Reports he has been using RW, however, did require use of WC when he felt more debilitated. Reports he has had increased difficulty with mobility and has had falls at home.      Hand Dominance        Extremity/Trunk Assessment   Upper Extremity Assessment Upper Extremity Assessment: Defer to OT evaluation    Lower Extremity Assessment Lower Extremity Assessment: Generalized weakness    Cervical / Trunk Assessment Cervical / Trunk Assessment: Other exceptions Cervical / Trunk Exceptions: imaging demonstrated thoracolumbar discitis/osteomyelitis at multiple levels with marked collapse/retropulsion of vertebral body at L3-4 with severe spinal stenosis and abscess  Communication   Communication: No difficulties  Cognition Arousal/Alertness: Awake/alert Behavior During Therapy: WFL for tasks assessed/performed Overall Cognitive Status: Within Functional Limits for tasks assessed                                 General Comments: WFL for tasks completed      General Comments      Exercises     Assessment/Plan    PT Assessment Patient needs continued PT services  PT Problem List Decreased strength;Decreased activity tolerance;Decreased balance;Decreased mobility;Decreased knowledge of use of DME;Decreased knowledge of precautions;Pain       PT Treatment Interventions DME instruction;Gait training;Stair training;Functional mobility training;Balance training;Therapeutic exercise;Therapeutic activities;Patient/family education    PT Goals (Current goals can be found in the Care Plan section)  Acute Rehab PT Goals Patient Stated Goal: "Stop pain" PT Goal Formulation: With patient Time For Goal Achievement: 10/18/18 Potential to Achieve Goals: Fair    Frequency Min 3X/week   Barriers to discharge Other (comment) Limited physical assist  able to be provided at home.     Co-evaluation               AM-PAC PT "6 Clicks" Daily Activity  Outcome Measure Difficulty turning over in bed (including adjusting bedclothes, sheets and blankets)?: A Lot Difficulty moving from lying on back to sitting on the side of the bed? : Unable Difficulty sitting down on and standing up from a chair with arms (e.g., wheelchair, bedside commode, etc,.)?: Unable Help needed moving to and from a bed to chair (including a wheelchair)?: A Lot Help needed walking in hospital room?: Total Help needed climbing 3-5 steps with a railing? : Total 6 Click Score: 8    End of Session Equipment Utilized During Treatment: Gait belt Activity Tolerance: Patient limited by pain Patient left: in bed;with call bell/phone within reach;with bed alarm set Nurse Communication: Mobility status PT Visit Diagnosis: Other abnormalities of gait and mobility (R26.89);Muscle weakness (generalized) (M62.81);Difficulty in walking, not elsewhere classified (R26.2);Pain;History of falling (Z91.81) Pain - part of body: (back)    Time: 1324-4010 PT Time Calculation (min) (ACUTE ONLY): 12 min   Charges:   PT Evaluation $PT Eval Moderate Complexity: 1 Mod          Gladys Damme, PT, DPT  Acute Rehabilitation Services  Pager: [phone] Office: [phone]   Lehman Prom 10/04/2018, 5:50 PM

## 2018-10-04 NOTE — Plan of Care (Signed)
  Problem: Activity: Goal: Risk for activity intolerance will decrease Outcome: Progressing   Problem: Pain Managment: Goal: General experience of comfort will improve Outcome: Progressing   

## 2018-10-05 LAB — CULTURE, BLOOD (ROUTINE X 2)
CULTURE: NO GROWTH
Culture: NO GROWTH

## 2018-10-05 LAB — HEPATITIS B SURFACE AG, CONFIRM: HBSAG CONFIRMATION: POSITIVE — AB

## 2018-10-05 LAB — HCV RNA QUANT
HCV QUANT LOG: 4.556 {Log_IU}/mL (ref >1.70)
HCV QUANT: 36000 [IU]/mL (ref >50)

## 2018-10-05 LAB — HEPATITIS B DNA, ULTRAQUANTITATIVE, PCR

## 2018-10-05 LAB — HBV REAL-TIME PCR, QUANT
HBV AS IU/ML: 308000000 [IU]/mL
LOG10 HBV AS IU/ML: 8.489 {Log_IU}/mL

## 2018-10-05 LAB — HEPATITIS B SURFACE ANTIGEN

## 2018-10-05 LAB — HEPATITIS B CORE ANTIBODY, TOTAL: Hep B Core Total Ab: POSITIVE — AB

## 2018-10-05 LAB — HEPATITIS B E ANTIGEN: Hep B E Ag: POSITIVE — AB

## 2018-10-05 MED ORDER — OXYCODONE HCL 5 MG PO TABS
10.0000 mg | ORAL_TABLET | ORAL | Status: DC | PRN
  Administered 2018-10-05: 15 mg via ORAL
  Administered 2018-10-05 – 2018-10-12 (×32): 20 mg via ORAL
  Filled 2018-10-05 (×3): qty 4
  Filled 2018-10-05: qty 3
  Filled 2018-10-05 (×29): qty 4

## 2018-10-05 NOTE — Progress Notes (Signed)
PT Cancellation Note  Patient Details Name: Dan [name] MRN: [id number] DOB: 1971/08/06   Cancelled Treatment:    Reason Eval/Treat Not Completed: Patient declined, no reason specified attempted to work with patient this afternoon, he politely declines PT due to pain 10/10 after having just worked with OT. Will follow and attempt to return if time/schedule allow, otherwise will attempt to return on next day of service.    Nedra Hai PT, DPT, CBIS  Supplemental Physical Therapist [facility]    Pager [phone] Acute Rehab Office [phone]

## 2018-10-05 NOTE — Progress Notes (Signed)
PROGRESS NOTE    Dan [name]  ZOX:[id number] DOB: December 21, 1970 DOA: 09/29/2018 PCP: [name], NP    Brief Narrative:  47 y.o. 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 He was afebrile with normal WBC but imaging demonstrated thoracolumbar discitis/osteomyelitis at multiple levels with marked collapse/retropulsion of vertebral body at L3-4 with severe spinal stenosis and abscess extending to bilateral psoas muscles. Neurosurgery was consulted, though the patient has no myelopathic symptoms or signs and medical management was recommended. ID was consulted, recommended vancomycin and ceftriaxone following abscess sampling which was performed 10/19. Abscess and urine cultures grew Serratia, though blood cultures oddly remain negative. 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 ?Hematogenous spread but NG on blood Cx. With IVDU, will need TEE per discussion with ID.  - Follow blood cultures - Consulted cardiology for TEE this week, planned for 10/25. TTE without vegetations. - Continue abx per ID based on susceptibility data. Anticipation for 6-8 weeks fluoroquinolone for discitis.  L3-4 vertebral body  collapse, retropulsion causing severe spinal stenosis: No myelopathy. Neurosurgery consulted, recommending IV antibiotics, no surgery.  - PT/OT continued - Pain control: Significant tolerance, so on oxycontin 30mg  q12h and oxyIR 10-20mg  q3h prn mod/sev pain. Discussed with patient that we anticipate deescalation as infection is treated.  -continue abx as per above  Liver cirrhosis with ascites, portal hypertensive gastropathy, history of bleeding gastric ulcer, thrombocytopenia: Normal mentation without evidence of GI bleeding.  - Monitor mental status, abdominal exam, CBC closely. Stable. - Avoid tylenol.  -Repeat cmp in AM  Polysubstance abuse, IVDU:  - Cessation counseling provided.  - Will NOT be a candidate for home health IV antibiotics -stable at present.  Hepatitis B and C:  - Follow up as outpatient  Moderate protein calorie malnutrition:  - Protein supplemented -Per nutrition  Anemia of chronic disease:  - Repeat cbc in AM  DVT prophylaxis: SCD's Code Status: Full Family Communication: Pt in room, family not at bedside Disposition Plan: Uncertain at this time  Consultants:   Neurosurgery, Dr. Maurice Small  Infectious Diseases, Dr. Drue Second  Interventional radiology, Dr. Loreta Ave  Procedures:   Imaging guided L3-L4 disc aspiration for sample 10/01/2018 by Dr. Loreta Ave.  Echocardiogram 10/01/2018: - Left ventricle: The cavity size was normal. Bourque thickness was normal. Systolic function was normal. The estimated ejection fraction was in the range of 60% to 65%. Kolle motion was normal; there were no regional Glaza motion abnormalities. Left ventricular diastolic function parameters were normal. - Mitral valve: Mildly thickened leaflets . There was trace to mild regurgitation. - Left atrium: The atrium was normal in size. - Inferior vena cava: The vessel was normal in size. The respirophasic diameter changes were in the normal  range (=  50%), consistent with normal central venous pressure.  Impressions:  - Compared to a prior study in 2017, there have been no changes. No vegetation is noted.  Antimicrobials: Anti-infectives (From admission, onward)   Start     Dose/Rate Route Frequency Ordered Stop   10/01/18 2000  vancomycin (VANCOCIN) IVPB 1000 mg/200 mL premix  Status:  Discontinued     1,000 mg 200 mL/hr over 60 Minutes Intravenous Every 8 hours 10/01/18 1903 10/03/18 0939   10/01/18 1900  cefTRIAXone (ROCEPHIN) 2 g in sodium chloride 0.9 % 100 mL IVPB     2 g 200 mL/hr over 30 Minutes Intravenous Every 24 hours 10/01/18 1857     09/29/18 2315  piperacillin-tazobactam (ZOSYN) IVPB 3.375 g     3.375 g 100 mL/hr over 30 Minutes Intravenous NOW 09/29/18 2306 09/29/18 2359   09/29/18 2315  vancomycin (VANCOCIN) 1,500 mg in sodium chloride 0.9 % 500 mL IVPB     1,500 mg 250 mL/hr over 120 Minutes Intravenous  Once 09/29/18 2306 09/30/18 0310       Subjective: No complaints this AM  Objective: Vitals:   10/04/18 1416 10/04/18 2046 10/05/18 0521 10/05/18 1406  BP: 100/68 116/69 102/63 124/67  Pulse: 95 (!) 101 (!) 101 76  Resp: 16 16 16 18   Temp: 97.6 F (36.4 C) 98.1 F (36.7 C) 98 F (36.7 C) 98 F (36.7 C)  TempSrc: Oral Oral Oral Oral  SpO2: 96% (!) 88% 96% 98%  Weight:      Height:        Intake/Output Summary (Last 24 hours) at 10/05/2018 1816 Last data filed at 10/05/2018 1345 Gross per 24 hour  Intake 240 ml  Output 450 ml  Net -210 ml   Filed Weights   09/29/18 1828 10/01/18 1157  Weight: 63.5 kg 72.8 kg    Examination:  General exam: Appears calm and comfortable  Respiratory system: Clear to auscultation. Respiratory effort normal. Cardiovascular system: S1 & S2 heard, RRR Gastrointestinal system: Abdomen is nondistended, soft and nontender. No organomegaly or masses felt. Normal bowel sounds heard. Central nervous system: Alert and oriented. No focal neurological  deficits. Extremities: Symmetric 5 x 5 power. Skin: No rashes, lesions Psychiatry: Judgement and insight appear normal. Mood & affect appropriate.   Data Reviewed: I have personally reviewed following labs and imaging studies  CBC: Recent Labs  Lab 09/29/18 2235 10/01/18 0449 10/03/18 0513  WBC 5.5 4.4 4.0  HGB 10.5* 9.5* 9.4*  HCT 34.7* 30.9* 30.6*  MCV 84.8 84.0 84.3  PLT 100* 88* 83*   Basic Metabolic Panel: Recent Labs  Lab 09/29/18 2235 10/01/18 0449 10/03/18 0513  NA 137 136 134*  K 3.9 4.3 4.8  CL 102 107 103  CO2 29 22 25   GLUCOSE 105* 114* 144*  BUN 19 12 25*  CREATININE 0.67 0.80 0.89  CALCIUM 8.0* 7.7* 7.9*   GFR: Estimated Creatinine Clearance: 103.4 mL/min (by C-G formula based on SCr of 0.89 mg/dL). Liver Function Tests: Recent Labs  Lab 09/29/18 2235 10/01/18 0449  AST 18 16  ALT 14 13  ALKPHOS 137* 122  BILITOT 0.4 0.3  PROT 7.3 6.1*  ALBUMIN 2.2* 1.9*   Recent Labs  Lab 09/29/18 2235  LIPASE 42   No results for input(s): AMMONIA in the last 168 hours. Coagulation Profile: Recent Labs  Lab 09/30/18 1618  INR 1.24   Cardiac Enzymes: No results for input(s): CKTOTAL, CKMB, CKMBINDEX, TROPONINI in the last 168 hours.  BNP (last 3 results) No results for input(s): PROBNP in the last 8760 hours. HbA1C: No results for input(s): HGBA1C in the last 72 hours. CBG: No results for input(s): GLUCAP in the last 168 hours. Lipid Profile: No results for input(s): CHOL, HDL, LDLCALC, TRIG, CHOLHDL, LDLDIRECT in the last 72 hours. Thyroid Function Tests: No results for input(s): TSH, T4TOTAL, FREET4, T3FREE, THYROIDAB in the last 72 hours. Anemia Panel: No results for input(s): VITAMINB12, FOLATE, FERRITIN, TIBC, IRON, RETICCTPCT in the last 72 hours. Sepsis Labs: Recent Labs  Lab 09/29/18 2329 09/30/18 0107  LATICACIDVEN 1.19 0.76    Recent Results (from the past 240 hour(s))  Blood Culture (routine x 2)     Status: None   Collection  Time: 09/29/18 11:32 PM  Result Value Ref Range Status   Specimen Description   Final    BLOOD LEFT HAND Performed at [facility], [address] [place], [place] [address]    Special Requests   Final    BOTTLES DRAWN AEROBIC AND ANAEROBIC Blood Culture results may not be optimal due to an inadequate volume of blood received in culture bottles Performed at [facility], [address], [place], [place] [address]    Culture   Final    NO GROWTH 5 DAYS Performed at [facility], [address], [place], [place] [address]    Report Status 10/05/2018 FINAL  Final  Blood Culture (routine x 2)     Status: None   Collection Time: 09/29/18 11:32 PM  Result Value Ref Range Status   Specimen Description   Final    BLOOD RIGHT FOREARM Performed at [facility], [address], [place], [place] [address]    Special Requests   Final    BOTTLES DRAWN AEROBIC AND ANAEROBIC Blood Culture results may not be optimal due to an inadequate volume of blood received in culture bottles Performed at [facility], [address], [place], [place] [address]    Culture   Final    NO GROWTH 5 DAYS Performed at [facility], [address], [place], [place] [address]    Report Status 10/05/2018 FINAL  Final  Urine culture     Status: Abnormal   Collection Time: 09/29/18 11:32 PM  Result Value Ref Range Status   Specimen Description   Final    URINE, CLEAN CATCH Performed at [facility], [address], [place], [place] [address]    Special Requests   Final    NONE Performed at [facility], [address], [place], [place] [address]    Culture >=100,000 COLONIES/mL SERRATIA MARCESCENS (A)  Final   Report Status 10/02/2018 FINAL  Final   Organism ID, Bacteria SERRATIA MARCESCENS (A)  Final      Susceptibility   Serratia marcescens - MIC*    CEFAZOLIN >=64 RESISTANT Resistant     CEFTRIAXONE <=1  SENSITIVE Sensitive     CIPROFLOXACIN <=0.25 SENSITIVE Sensitive     GENTAMICIN <=1 SENSITIVE Sensitive     NITROFURANTOIN 128 RESISTANT Resistant     TRIMETH/SULFA <=20 SENSITIVE Sensitive     * >=100,000 COLONIES/mL SERRATIA MARCESCENS  Aerobic/Anaerobic Culture (surgical/deep wound)     Status: None (Preliminary result)   Collection Time: 10/01/18 10:26 AM  Result Value Ref Range Status   Specimen Description ABSCESS VERTEBRA L3 L4  Final   Special Requests NONE  Final   Gram Stain  Final    FEW WBC PRESENT, PREDOMINANTLY PMN NO ORGANISMS SEEN Performed at [facility], [address], [place], [place] [address]    Culture   Final    FEW SERRATIA MARCESCENS NO ANAEROBES ISOLATED; CULTURE IN PROGRESS FOR 5 DAYS    Report Status PENDING  Incomplete   Organism ID, Bacteria SERRATIA MARCESCENS  Final      Susceptibility   Serratia marcescens - MIC*    CEFAZOLIN >=64 RESISTANT Resistant     CEFEPIME <=1 SENSITIVE Sensitive     CEFTAZIDIME <=1 SENSITIVE Sensitive     CEFTRIAXONE <=1 SENSITIVE Sensitive     CIPROFLOXACIN <=0.25 SENSITIVE Sensitive     GENTAMICIN <=1 SENSITIVE Sensitive     TRIMETH/SULFA <=20 SENSITIVE Sensitive     * FEW SERRATIA MARCESCENS     Radiology Studies: No results found.  Scheduled Meds: . feeding supplement (ENSURE ENLIVE)  237 mL Oral TID BM  . multivitamin with minerals  1 tablet Oral Daily  . oxyCODONE  30 mg Oral Q12H  . polyethylene glycol  17 g Oral Daily  . senna-docusate  1 tablet Oral BID  . traZODone  100 mg Oral QHS   Continuous Infusions: . cefTRIAXone (ROCEPHIN)  IV 2 g (10/04/18 1846)     LOS: 5 days   Rickey Barbara, MD [facility] Pager On Amion  If 7PM-7AM, please contact night-coverage 10/05/2018, 6:16 PM

## 2018-10-05 NOTE — Progress Notes (Signed)
Occupational Therapy Treatment Patient Details Name: EARLY STEEL MRN: [id number] DOB: [birth year] Today's Date: 10/05/2018    History of present illness 47 yo male presented with back pain progressively worsening over the past month causing severe debilitation which he started using IV heroin to treat. 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 Neurosurgery consulted and recommend non-surgical approach with IV antibiotics. 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    OT comments  Pt agreeable for short session sitting EOB for education on AE to assist with LB ADL. Pt able to practice with reacher/grabber, sock donner - also educated on long handle shoe horn and long handle sponge. Pt refused OOB or attempt sit <>Stand at this time despite education and max encouragement. OT updated dc recommendation to SNF, OT will continue to follow acutely.    Follow Up Recommendations  SNF;Supervision/Assistance - 24 hour    Equipment Recommendations  3 in 1 bedside commode    Recommendations for Other Services PT consult    Precautions / Restrictions Precautions Precautions: Fall;Back Precaution Booklet Issued: No Precaution Comments: Reviewed back precautions for pain management. Pt unable to remember precautions from previous session Restrictions Weight Bearing Restrictions: No       Mobility Bed Mobility Overal bed mobility: Needs Assistance Bed Mobility: Sidelying to Sit;Sit to Sidelying Rolling: Min guard Sidelying to sit: Min assist     Sit to sidelying: Supervision General bed mobility comments: Min A For trunk elevation  Transfers Overall transfer level: Needs assistance Equipment used:  Rolling walker (2 wheeled) Transfers: Sit to/from Stand Sit to Stand: Mod assist         General transfer comment: Mod A for lift assist and steadying. Pt only able to tolerate brief period in standing secondary to increased pain.     Balance Overall balance assessment: Needs assistance Sitting-balance support: Bilateral upper extremity supported;Feet supported Sitting balance-Leahy Scale: Fair Sitting balance - Comments: Bracing at EOB secondary to pain.    Standing balance support: Bilateral upper extremity supported Standing balance-Leahy Scale: Poor Standing balance comment: Heavy reliance on UE support                            ADL either performed or assessed with clinical judgement   ADL Overall ADL's : Needs assistance/impaired         Upper Body Bathing: Set up;Supervision/ safety;Sitting;With adaptive equipment Upper Body Bathing Details (indicate cue type and reason): educated on use of long handle sponge Lower Body Bathing: Set up;With adaptive equipment;Sitting/lateral leans Lower Body Bathing Details (indicate cue type and reason): educated on long handle sponge Upper Body Dressing : Set up;Supervision/safety   Lower Body Dressing: Moderate assistance;Sit to/from stand Lower Body Dressing Details (indicate cue type and reason): edcuated on grabber/reacher, long handle shoe horn, sock donner             Functional mobility during ADLs: (Nt this session, Pt declined) General ADL Comments: Focused session on AE for LB ADL and maintaining back precautions     Vision       Perception     Praxis      Cognition Arousal/Alertness: Awake/alert Behavior During Therapy: WFL for tasks assessed/performed Overall Cognitive Status: Within Functional Limits  for tasks assessed                                 General Comments: [facility] for tasks completed        Exercises     Shoulder Instructions       General Comments       Pertinent Vitals/ Pain       Pain Assessment: 0-10 Pain Score: 10-Worst pain ever Pain Location: Back Pain Descriptors / Indicators: Constant;Discomfort;Grimacing Pain Intervention(s): Limited activity within patient's tolerance;Monitored during session;Repositioned  Home Living                                          Prior Functioning/Environment              Frequency  Min 2X/week        Progress Toward Goals  OT Goals(current goals can now be found in the care plan section)  Progress towards OT goals: Progressing toward goals  Acute Rehab OT Goals Patient Stated Goal: "Stop pain" OT Goal Formulation: With patient Time For Goal Achievement: 10/18/18 Potential to Achieve Goals: Good  Plan Discharge plan needs to be updated;Frequency remains appropriate    Co-evaluation                 AM-PAC PT "6 Clicks" Daily Activity     Outcome Measure   Help from another person eating meals?: None Help from another person taking care of personal grooming?: A Little Help from another person toileting, which includes using toliet, bedpan, or urinal?: A Little Help from another person bathing (including washing, rinsing, drying)?: A Lot Help from another person to put on and taking off regular upper body clothing?: A Little Help from another person to put on and taking off regular lower body clothing?: A Lot 6 Click Score: 17    End of Session    OT Visit Diagnosis: Unsteadiness on feet (R26.81);Other abnormalities of gait and mobility (R26.89);Muscle weakness (generalized) (M62.81);Pain Pain - part of body: (back)   Activity Tolerance Patient limited by pain   Patient Left in bed;with call bell/phone within reach;with bed alarm set   Nurse Communication Mobility status;Precautions        Time: 6045-4098 OT Time Calculation (min): 18 min  Charges: OT General Charges $OT Visit: 1 Visit OT Treatments $Self Care/Home Management :  8-22 mins  Sherryl Manges OTR/L Acute Rehabilitation Services Pager: [phone] Office: [phone]   Evern Bio Harrell Niehoff 10/05/2018, 6:49 PM

## 2018-10-06 LAB — AEROBIC/ANAEROBIC CULTURE W GRAM STAIN (SURGICAL/DEEP WOUND)

## 2018-10-06 LAB — COMPREHENSIVE METABOLIC PANEL
ALT: 35 U/L (ref 0–44)
AST: 46 U/L — AB (ref 15–41)
Albumin: 2 g/dL — ABNORMAL LOW (ref 3.5–5.0)
Alkaline Phosphatase: 144 U/L — ABNORMAL HIGH (ref 38–126)
Anion gap: 6 (ref 5–15)
BUN: 29 mg/dL — AB (ref 6–20)
CHLORIDE: 101 mmol/L (ref 98–111)
CO2: 28 mmol/L (ref 22–32)
Calcium: 8.3 mg/dL — ABNORMAL LOW (ref 8.9–10.3)
Creatinine, Ser: 0.76 mg/dL (ref 0.61–1.24)
GFR calc Af Amer: >60 mL/min (ref >60)
GLUCOSE: 113 mg/dL — AB (ref 70–99)
Potassium: 4.9 mmol/L (ref 3.5–5.1)
SODIUM: 135 mmol/L (ref 135–145)
Total Bilirubin: 0.3 mg/dL (ref 0.3–1.2)
Total Protein: 6.6 g/dL (ref 6.5–8.1)

## 2018-10-06 LAB — CBC
HEMATOCRIT: 28.9 % — AB (ref 39.0–52.0)
Hemoglobin: 8.6 g/dL — ABNORMAL LOW (ref 13.0–17.0)
MCH: 25.7 pg — ABNORMAL LOW (ref 26.0–34.0)
MCHC: 29.8 g/dL — AB (ref 30.0–36.0)
MCV: 86.5 fL (ref 80.0–100.0)
Platelets: 77 10*3/uL — ABNORMAL LOW (ref 150–400)
RBC: 3.34 MIL/uL — ABNORMAL LOW (ref 4.22–5.81)
RDW: 16.9 % — AB (ref 11.5–15.5)
WBC: 3.5 10*3/uL — ABNORMAL LOW (ref 4.0–10.5)
nRBC: 0 % (ref 0.0–0.2)

## 2018-10-06 LAB — HEPATITIS B E ANTIBODY: HEP B E AB: NEGATIVE

## 2018-10-06 LAB — AEROBIC/ANAEROBIC CULTURE (SURGICAL/DEEP WOUND)

## 2018-10-06 MED ORDER — TENOFOVIR ALAFENAMIDE FUMARATE 25 MG PO TABS: 1.0000 | ORAL_TABLET | Freq: Every day | ORAL | 3 refills | Status: DC

## 2018-10-06 MED ORDER — TENOFOVIR DISOPROXIL FUMARATE 300 MG PO TABS
300.0000 mg | ORAL_TABLET | Freq: Every day | ORAL | Status: DC
  Administered 2018-10-06 – 2018-10-11 (×6): 300 mg via ORAL
  Filled 2018-10-06 (×7): qty 1

## 2018-10-06 NOTE — Progress Notes (Signed)
[facility]  Date of Admission:  09/29/2018     Total days of antibiotics 8         ASSESSMENT/PLAN  Dan [name] is on day 8 of antimicrobial therapy for Serratia marcescens lumbar discitis with ceftriaxone.  Dan [name] is scheduled for TEE on 10/25 to rule out endocarditis.  Dan [name] has remained afebrile and clinically appears to be stable despite continued back pain.  Hepatitis B and C blood work has returned and is positive.  Given Dan [name] state of cirrhosis on CT scan and previous history we will start treatment for hepatitis B.  Hepatitis C can be treated outpatient.  There is concern with decreasing hemoglobin and platelets over the last 1 to 2 days.  1.  Continue current dose of ceftriaxone. 2.  Start tenofovir alafenamide for hepatitis B. 3.  Await TEE results. 4. Continue to monitor CBC and will discuss with GI regarding cirrhosis.    Principal Problem:   Discitis of thoracolumbar region Active Problems:   Other cirrhosis of liver (HCC)   Chronic hepatitis B (HCC)   Chronic hepatitis C without hepatic coma (HCC)   IV drug abuse (HCC)   Malnutrition of moderate degree   Alcoholic cirrhosis of liver with ascites (HCC)   Psoas abscess (HCC)   . feeding supplement (ENSURE ENLIVE)  237 mL Oral TID BM  . multivitamin with minerals  1 tablet Oral Daily  . oxyCODONE  30 mg Oral Q12H  . polyethylene glycol  17 g Oral Daily  . senna-docusate  1 tablet Oral BID  . tenofovir  300 mg Oral Daily  . traZODone  100 mg Oral QHS    SUBJECTIVE:  Afebrile with no leukocytosis. Dan [name] does have Hepatitis B and C. Describes a tight feeling in Dan [name] stomach last night that has improved this morning. Noted that Dan [name] hemoglobin and platelets have been slowly decreasing. Dan [name] denies any bruising or bleeding. Continues to have pain in Dan [name] back that remains labile with Dan [name] current medication regimen.   No Known Allergies   Review of Systems: Review of Systems  Constitutional: Negative  for chills and fever.  Respiratory: Negative for cough, shortness of breath and wheezing.   Cardiovascular: Negative for chest pain and leg swelling.  Gastrointestinal: Negative for abdominal pain, constipation, diarrhea, nausea and vomiting.  Musculoskeletal: Positive for back pain.  Skin: Negative for rash.    OBJECTIVE: Vitals:   10/05/18 2043 10/06/18 0502 10/06/18 0700 10/06/18 1431  BP: 105/76 103/60 103/70 101/66  Pulse: 75 90 73 94  Resp: 16 16 16 16   Temp: 98.2 F (36.8 C) 98 F (36.7 C) 98 F (36.7 C) 98.3 F (36.8 C)  TempSrc: Oral Oral Oral Oral  SpO2: 97% 95% 96% 97%  Weight:      Height:       Body mass index is 22.38 kg/m.  Physical Exam  Constitutional: Dan [name] is oriented to person, place, and time. Dan [name] appears well-developed and well-nourished. No distress.  Cardiovascular: Normal rate, regular rhythm, normal heart sounds and intact distal pulses. Exam reveals no gallop and no friction rub.  No murmur heard. Pulmonary/Chest: Effort normal and breath sounds normal. No stridor. Dan [name] has no wheezes. Dan [name] has no rales. Dan [name] exhibits no tenderness.  Abdominal: Soft. Bowel sounds are normal. Dan [name] exhibits no distension and no mass. There is no tenderness. There is no guarding.  Neurological: Dan [name] is alert and oriented to person, place, and time.  Skin: Skin is warm and  dry.  Psychiatric: Dan [name] has a normal mood and affect. Dan [name] behavior is normal. Judgment and thought content normal.    Lab Results Lab Results  Component Value Date   WBC 3.5 (L) 10/06/2018   HGB 8.6 (L) 10/06/2018   HCT 28.9 (L) 10/06/2018   MCV 86.5 10/06/2018   PLT 77 (L) 10/06/2018    Lab Results  Component Value Date   CREATININE 0.76 10/06/2018   BUN 29 (H) 10/06/2018   NA 135 10/06/2018   K 4.9 10/06/2018   CL 101 10/06/2018   CO2 28 10/06/2018    Lab Results  Component Value Date   ALT 35 10/06/2018   AST 46 (H) 10/06/2018   ALKPHOS 144 (H) 10/06/2018   BILITOT 0.3 10/06/2018      Microbiology: Recent Results (from the past 240 hour(s))  Blood Culture (routine x 2)     Status: None   Collection Time: 09/29/18 11:32 PM  Result Value Ref Range Status   Specimen Description   Final    BLOOD LEFT HAND Performed at [facility], [address] [place], [place] [address]    Special Requests   Final    BOTTLES DRAWN AEROBIC AND ANAEROBIC Blood Culture results may not be optimal due to an inadequate volume of blood received in culture bottles Performed at [facility], [address], [place], [place] [address]    Culture   Final    NO GROWTH 5 DAYS Performed at [facility], [address] [place], [place] [address]    Report Status 10/05/2018 FINAL  Final  Blood Culture (routine x 2)     Status: None   Collection Time: 09/29/18 11:32 PM  Result Value Ref Range Status   Specimen Description   Final    BLOOD RIGHT FOREARM Performed at [facility], [address] [place], [place] [address]    Special Requests   Final    BOTTLES DRAWN AEROBIC AND ANAEROBIC Blood Culture results may not be optimal due to an inadequate volume of blood received in culture bottles Performed at [facility], [address] [place], [place] [address]    Culture   Final    NO GROWTH 5 DAYS Performed at [facility], [address], [place], [place] [address]    Report Status 10/05/2018 FINAL  Final  Urine culture     Status: Abnormal   Collection Time: 09/29/18 11:32 PM  Result Value Ref Range Status   Specimen Description   Final    URINE, CLEAN CATCH Performed at [facility], [address] [place], [place] [address]    Special Requests   Final    NONE Performed at [facility], [address], [place], [place] [address]    Culture >=100,000 COLONIES/mL SERRATIA MARCESCENS (A)  Final   Report Status 10/02/2018 FINAL  Final   Organism ID, Bacteria SERRATIA MARCESCENS  (A)  Final      Susceptibility   Serratia marcescens - MIC*    CEFAZOLIN >=64 RESISTANT Resistant     CEFTRIAXONE <=1 SENSITIVE Sensitive     CIPROFLOXACIN <=0.25 SENSITIVE Sensitive     GENTAMICIN <=1 SENSITIVE Sensitive     NITROFURANTOIN 128 RESISTANT Resistant     TRIMETH/SULFA <=20 SENSITIVE Sensitive     * >=100,000 COLONIES/mL SERRATIA MARCESCENS  Aerobic/Anaerobic Culture (surgical/deep wound)     Status: None   Collection Time: 10/01/18 10:26 AM  Result Value Ref Range Status   Specimen Description ABSCESS VERTEBRA L3 L4  Final   Special Requests NONE  Final   Gram Stain   Final    FEW WBC PRESENT, PREDOMINANTLY PMN NO ORGANISMS SEEN    Culture   Final    FEW SERRATIA MARCESCENS NO ANAEROBES ISOLATED Performed at [facility], [address] [place], [place] [address]    Report Status 10/06/2018 FINAL  Final   Organism ID, Bacteria SERRATIA MARCESCENS  Final      Susceptibility   Serratia marcescens - MIC*    CEFAZOLIN >=64 RESISTANT Resistant     CEFEPIME <=1 SENSITIVE Sensitive     CEFTAZIDIME <=1 SENSITIVE Sensitive     CEFTRIAXONE <=1 SENSITIVE Sensitive     CIPROFLOXACIN <=0.25 SENSITIVE Sensitive     GENTAMICIN <=1 SENSITIVE Sensitive     TRIMETH/SULFA <=20 SENSITIVE Sensitive     * FEW SERRATIA MARCESCENS     Marcos Eke, NP [facility] [phone] Pager  10/06/2018  5:43 PM

## 2018-10-06 NOTE — Plan of Care (Signed)

## 2018-10-06 NOTE — Progress Notes (Signed)
Physical Therapy Treatment Patient Details Name: Dan [name] MRN: [id number] DOB: 05/01/1971 Today's Date: 10/06/2018    History of Present Illness 47 yo male presented with back pain progressively worsening over the past month causing severe debilitation which he started using IV heroin to treat. 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 Neurosurgery consulted and recommend non-surgical approach with IV antibiotics. 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     PT Comments    Pt limited in mobility tolerance secondary to pain this session. Pt only agreeable to sitting EOB, despite education about importance of OOB mobility. Feel pt may be self limiting. Instructed pt to sit at EOB during meals. Pt agreeable to performing supine and seated HEP during session. Current recommendations appropriate. Will continue to follow acutely to maximize functional mobility independence and safety.       Follow Up Recommendations  SNF;Supervision/Assistance - 24 hour     Equipment Recommendations  None recommended by PT    Recommendations for Other Services       Precautions / Restrictions Precautions Precautions: Fall;Back Precaution Booklet Issued: No Precaution Comments: Reviewed back precautions for pain management. Pt unable to remember precautions from previous session Restrictions Weight Bearing Restrictions: No    Mobility  Bed Mobility Overal bed mobility: Needs Assistance Bed Mobility: Sidelying to Sit;Rolling;Sit to Sidelying Rolling: Supervision Sidelying to sit: Min assist     Sit to sidelying: Supervision General bed mobility comments: Min A For trunk elevation. Increased time required.    Transfers                 General transfer comment: Pt refusing this session secondary to increased pain.   Ambulation/Gait                 Stairs             Wheelchair Mobility    Modified Rankin (Stroke Patients Only)       Balance Overall balance assessment: Needs assistance Sitting-balance support: Bilateral upper extremity supported;Feet supported Sitting balance-Leahy Scale: Fair Sitting balance - Comments: Bracing at EOB secondary to pain.                                     Cognition Arousal/Alertness: Awake/alert Behavior During Therapy: WFL for tasks assessed/performed Overall Cognitive Status: Within Functional Limits for tasks assessed                                        Exercises General Exercises - Lower Extremity Ankle Circles/Pumps: AROM;Both;20 reps Quad Sets: AROM;Both;5 reps;Seated Heel Slides: AROM;Both;5 reps;Supine    General Comments General comments (skin integrity, edema, etc.): Educated about importance of working with PT and improving mobility. Instructed to sit up at EOB for meals, and notified RN as well about instruction.       Pertinent Vitals/Pain Pain Assessment: 0-10 Pain Score: 8  Pain Location: Back Pain Descriptors / Indicators: Constant;Discomfort;Grimacing Pain Intervention(s): Limited activity within patient's tolerance;Monitored during session;Repositioned    Home Living  Prior Function            PT Goals (current goals can now be found in the care plan section) Acute Rehab PT Goals Patient Stated Goal: for pain to get better PT Goal Formulation: With patient Time For Goal Achievement: 10/18/18 Potential to Achieve Goals: Fair Progress towards PT goals: Progressing toward goals    Frequency    Min 3X/week      PT Plan Current plan remains appropriate    Co-evaluation              AM-PAC PT "6 Clicks" Daily  Activity  Outcome Measure  Difficulty turning over in bed (including adjusting bedclothes, sheets and blankets)?: A Lot Difficulty moving from lying on back to sitting on the side of the bed? : Unable Difficulty sitting down on and standing up from a chair with arms (e.g., wheelchair, bedside commode, etc,.)?: Unable Help needed moving to and from a bed to chair (including a wheelchair)?: A Lot Help needed walking in hospital room?: Total Help needed climbing 3-5 steps with a railing? : Total 6 Click Score: 8    End of Session   Activity Tolerance: Patient limited by pain Patient left: in bed;with call bell/phone within reach Nurse Communication: Mobility status PT Visit Diagnosis: Other abnormalities of gait and mobility (R26.89);Muscle weakness (generalized) (M62.81);Difficulty in walking, not elsewhere classified (R26.2);Pain;History of falling (Z91.81) Pain - part of body: (back )     Time: 7829-5621 PT Time Calculation (min) (ACUTE ONLY): 10 min  Charges:  $Therapeutic Activity: 8-22 mins                     Gladys Damme, PT, DPT  Acute Rehabilitation Services  Pager: [phone] Office: [phone]    Lehman Prom 10/06/2018, 9:12 AM

## 2018-10-06 NOTE — Progress Notes (Signed)
Initial Nutrition Assessment  DOCUMENTATION CODES:   Non-severe (moderate) malnutrition in context of social or environmental circumstances  INTERVENTION:    Continue Ensure Enlive po TID, each supplement provides 350 kcal and 20 grams of protein  Continue MVI daily  NUTRITION DIAGNOSIS:   Moderate Malnutrition related to social / environmental circumstances(IVDU) as evidenced by mild fat depletion, moderate muscle depletion.  Ongoing  GOAL:   Patient will meet greater than or equal to 90% of their needs  Met with intake of meals and supplements  MONITOR:   PO intake, Supplement acceptance, Weight trends, Labs, I & O's  ASSESSMENT:   47 y/o male PMHx Cirrhosis, Polysubstance abuse, Hep B, Hep C. Presented w/ complaint of back pain, subjective fevers/chills and poor intake at home. Admits he is using IVD at home. Imaging revealed discitis and L3/L4 abscess into bilateral psoas muscle. Admitted for management.   Patient sleeping during RD visit and did not wake up when name called x 2. Per chart documentation, he has been drinking Ensure Enlive 2-3 bottles per day. He is eating 100% of meals. Suspect intake is meeting nutrition needs.  TEE planned for 10/25. Labs and medications reviewed. Receiving IV antibiotics.  Diet Order:   Diet Order            Diet regular Room service appropriate? Yes; Fluid consistency: Thin  Diet effective now              EDUCATION NEEDS:   No education needs have been identified at this time  Skin:  Skin Assessment: Reviewed RN Assessment  Last BM:  10/19  Height:   Ht Readings from Last 1 Encounters:  09/29/18 '5\' 11"'  (1.803 m)    Weight:   Wt Readings from Last 1 Encounters:  10/01/18 72.8 kg    Ideal Body Weight:  78.18 kg  BMI:  Body mass index is 22.38 kg/m.  Estimated Nutritional Needs:   Kcal:  2200-2400  Protein:  105-120 gm  Fluid:  2.2-2.4 L    Molli Barrows, RD, LDN, CNSC Pager [phone] After  Hours Pager [phone]

## 2018-10-06 NOTE — Progress Notes (Signed)
PROGRESS NOTE    ISAC LINCKS  ZOX:[id number] DOB: 12-Jul-1971 DOA: 09/29/2018 PCP: [name], NP    Brief Narrative:  47 y.o. 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 He was afebrile with normal WBC but imaging demonstrated thoracolumbar discitis/osteomyelitis at multiple levels with marked collapse/retropulsion of vertebral body at L3-4 with severe spinal stenosis and abscess extending to bilateral psoas muscles. Neurosurgery was consulted, though the patient has no myelopathic symptoms or signs and medical management was recommended. ID was consulted, recommended vancomycin and ceftriaxone following abscess sampling which was performed 10/19. Abscess and urine cultures grew Serratia, though blood cultures oddly remain negative. 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 ?Hematogenous spread but NG on blood Cx. With IVDU, will need TEE per discussion with ID.  - Follow blood cultures - Consulted cardiology for TEE this week, planned for 10/25. TTE without vegetations. - Continue abx per ID recommendations. Likely eventually home on levofloxacin or cipro x 6-8weeks  L3-4 vertebral body collapse,  retropulsion causing severe spinal stenosis: No myelopathy. Neurosurgery consulted, recommending IV antibiotics, no surgery.  - PT/OT continued - Pain control: Significant tolerance, so on oxycontin 30mg  q12h and oxyIR 10-20mg  q3h prn mod/sev pain. Discussed with patient that we anticipate deescalation as infection is treated.  -continue abx as per above  Liver cirrhosis with ascites, portal hypertensive gastropathy, history of bleeding gastric ulcer, thrombocytopenia: Normal mentation without evidence of GI bleeding at present. Pt denies nausea, abd pain.  - Avoid tylenol.  -Repeat cmp in AM -Hgb down to 8.6 this AM from over 9 yesterday. Will discuss with GI  Polysubstance abuse, IVDU:  - Cessation counseling provided.  - Will NOT be a candidate for home health IV antibiotics - Remains stable at present  Hepatitis B and C:  - Serologies reviewed -Cont as per ID recs  Moderate protein calorie malnutrition:  - Protein supplemented - Continue as per nutrition  Anemia of chronic disease:  - Hgb down to 8.6 this AM. No obvious blood loss noted - Repeat CBC in aM  DVT prophylaxis: SCD's Code Status: Full Family Communication: Pt in room, family not at bedside Disposition Plan: Uncertain at this time  Consultants:   Neurosurgery, Dr. Maurice Small  Infectious Diseases, Dr. Drue Second  Interventional radiology, Dr. Loreta Ave  Procedures:   Imaging guided L3-L4 disc aspiration for sample 10/01/2018 by Dr. Loreta Ave.  Echocardiogram 10/01/2018: - Left ventricle: The cavity size was normal. Simoneaux thickness was normal. Systolic function was normal. The estimated ejection fraction was in the range of 60% to 65%. Farquharson motion was normal; there were no regional Favaro motion abnormalities. Left ventricular diastolic function parameters were normal. - Mitral valve: Mildly thickened leaflets . There was  trace to mild regurgitation. - Left atrium: The atrium was normal in size. -  Inferior vena cava: The vessel was normal in size. The respirophasic diameter changes were in the normal range (= 50%), consistent with normal central venous pressure.  Impressions:  - Compared to a prior study in 2017, there have been no changes. No vegetation is noted.  Antimicrobials: Anti-infectives (From admission, onward)   Start     Dose/Rate Route Frequency Ordered Stop   10/01/18 2000  vancomycin (VANCOCIN) IVPB 1000 mg/200 mL premix  Status:  Discontinued     1,000 mg 200 mL/hr over 60 Minutes Intravenous Every 8 hours 10/01/18 1903 10/03/18 0939   10/01/18 1900  cefTRIAXone (ROCEPHIN) 2 g in sodium chloride 0.9 % 100 mL IVPB     2 g 200 mL/hr over 30 Minutes Intravenous Every 24 hours 10/01/18 1857     09/29/18 2315  piperacillin-tazobactam (ZOSYN) IVPB 3.375 g     3.375 g 100 mL/hr over 30 Minutes Intravenous NOW 09/29/18 2306 09/29/18 2359   09/29/18 2315  vancomycin (VANCOCIN) 1,500 mg in sodium chloride 0.9 % 500 mL IVPB     1,500 mg 250 mL/hr over 120 Minutes Intravenous  Once 09/29/18 2306 09/30/18 0310      Subjective: Eager to have TEE performed  Objective: Vitals:   10/05/18 2043 10/06/18 0502 10/06/18 0700 10/06/18 1431  BP: 105/76 103/60 103/70 101/66  Pulse: 75 90 73 94  Resp: 16 16 16 16   Temp: 98.2 F (36.8 C) 98 F (36.7 C) 98 F (36.7 C) 98.3 F (36.8 C)  TempSrc: Oral Oral Oral Oral  SpO2: 97% 95% 96% 97%  Weight:      Height:        Intake/Output Summary (Last 24 hours) at 10/06/2018 1458 Last data filed at 10/06/2018 1431 Gross per 24 hour  Intake 236 ml  Output 3000 ml  Net -2764 ml   Filed Weights   09/29/18 1828 10/01/18 1157  Weight: 63.5 kg 72.8 kg    Examination: General exam: Awake, laying in bed, in nad Respiratory system: Normal respiratory effort, no wheezing Cardiovascular system: regular rate, s1, s2 Gastrointestinal system: Soft, nondistended, positive BS Central nervous system: CN2-12 grossly intact,  strength intact Extremities: Perfused, no clubbing Skin: Normal skin turgor, no notable skin lesions seen Psychiatry: Mood normal // no visual hallucinations   Data Reviewed: I have personally reviewed following labs and imaging studies  CBC: Recent Labs  Lab 09/29/18 2235 10/01/18 0449 10/03/18 0513 10/06/18 0247  WBC 5.5 4.4 4.0 3.5*  HGB 10.5* 9.5* 9.4* 8.6*  HCT 34.7* 30.9* 30.6* 28.9*  MCV 84.8 84.0 84.3 86.5  PLT 100* 88* 83* 77*   Basic Metabolic Panel: Recent Labs  Lab 09/29/18 2235 10/01/18 0449 10/03/18 0513 10/06/18 0247  NA 137 136 134* 135  K 3.9 4.3 4.8 4.9  CL 102 107 103 101  CO2 29 22 25 28   GLUCOSE 105* 114* 144* 113*  BUN 19 12 25* 29*  CREATININE 0.67 0.80 0.89 0.76  CALCIUM 8.0* 7.7* 7.9* 8.3*   GFR: Estimated Creatinine Clearance: 115 mL/min (by C-G formula based on SCr of 0.76 mg/dL). Liver Function Tests: Recent Labs  Lab 09/29/18 2235 10/01/18 0449 10/06/18 0247  AST 18 16 46*  ALT 14 13 35  ALKPHOS 137* 122 144*  BILITOT 0.4 0.3 0.3  PROT 7.3 6.1* 6.6  ALBUMIN 2.2* 1.9* 2.0*   Recent Labs  Lab 09/29/18 2235  LIPASE 42  No results for input(s): AMMONIA in the last 168 hours. Coagulation Profile: Recent Labs  Lab 09/30/18 1618  INR 1.24   Cardiac Enzymes: No results for input(s): CKTOTAL, CKMB, CKMBINDEX, TROPONINI in the last 168 hours. BNP (last 3 results) No results for input(s): PROBNP in the last 8760 hours. HbA1C: No results for input(s): HGBA1C in the last 72 hours. CBG: No results for input(s): GLUCAP in the last 168 hours. Lipid Profile: No results for input(s): CHOL, HDL, LDLCALC, TRIG, CHOLHDL, LDLDIRECT in the last 72 hours. Thyroid Function Tests: No results for input(s): TSH, T4TOTAL, FREET4, T3FREE, THYROIDAB in the last 72 hours. Anemia Panel: No results for input(s): VITAMINB12, FOLATE, FERRITIN, TIBC, IRON, RETICCTPCT in the last 72 hours. Sepsis Labs: Recent Labs  Lab 09/29/18 2329  09/30/18 0107  LATICACIDVEN 1.19 0.76    Recent Results (from the past 240 hour(s))  Blood Culture (routine x 2)     Status: None   Collection Time: 09/29/18 11:32 PM  Result Value Ref Range Status   Specimen Description   Final    BLOOD LEFT HAND Performed at [facility], [address] [place], [place] [address]    Special Requests   Final    BOTTLES DRAWN AEROBIC AND ANAEROBIC Blood Culture results may not be optimal due to an inadequate volume of blood received in culture bottles Performed at [facility], [address], [place], [place] [address]    Culture   Final    NO GROWTH 5 DAYS Performed at [facility], [address] [place], [place] [address]    Report Status 10/05/2018 FINAL  Final  Blood Culture (routine x 2)     Status: None   Collection Time: 09/29/18 11:32 PM  Result Value Ref Range Status   Specimen Description   Final    BLOOD RIGHT FOREARM Performed at [facility], [address] [place], [place] [address]    Special Requests   Final    BOTTLES DRAWN AEROBIC AND ANAEROBIC Blood Culture results may not be optimal due to an inadequate volume of blood received in culture bottles Performed at [facility], [address] [place], [place] [address]    Culture   Final    NO GROWTH 5 DAYS Performed at [facility], [address] [place], [place] [address]    Report Status 10/05/2018 FINAL  Final  Urine culture     Status: Abnormal   Collection Time: 09/29/18 11:32 PM  Result Value Ref Range Status   Specimen Description   Final    URINE, CLEAN CATCH Performed at [facility], [address] [place], [place] [address]    Special Requests   Final    NONE Performed at [facility], [address], [place], [place] [address]    Culture >=100,000 COLONIES/mL SERRATIA MARCESCENS (A)  Final   Report Status 10/02/2018 FINAL  Final   Organism ID,  Bacteria SERRATIA MARCESCENS (A)  Final      Susceptibility   Serratia marcescens - MIC*    CEFAZOLIN >=64 RESISTANT Resistant     CEFTRIAXONE <=1 SENSITIVE Sensitive     CIPROFLOXACIN <=0.25 SENSITIVE Sensitive     GENTAMICIN <=1 SENSITIVE Sensitive     NITROFURANTOIN 128 RESISTANT Resistant     TRIMETH/SULFA <=20 SENSITIVE Sensitive     * >=100,000 COLONIES/mL SERRATIA MARCESCENS  Aerobic/Anaerobic Culture (surgical/deep wound)     Status: None  Collection Time: 10/01/18 10:26 AM  Result Value Ref Range Status   Specimen Description ABSCESS VERTEBRA L3 L4  Final   Special Requests NONE  Final   Gram Stain   Final    FEW WBC PRESENT, PREDOMINANTLY PMN NO ORGANISMS SEEN    Culture   Final    FEW SERRATIA MARCESCENS NO ANAEROBES ISOLATED Performed at [facility], [address] [place], [place] [address]    Report Status 10/06/2018 FINAL  Final   Organism ID, Bacteria SERRATIA MARCESCENS  Final      Susceptibility   Serratia marcescens - MIC*    CEFAZOLIN >=64 RESISTANT Resistant     CEFEPIME <=1 SENSITIVE Sensitive     CEFTAZIDIME <=1 SENSITIVE Sensitive     CEFTRIAXONE <=1 SENSITIVE Sensitive     CIPROFLOXACIN <=0.25 SENSITIVE Sensitive     GENTAMICIN <=1 SENSITIVE Sensitive     TRIMETH/SULFA <=20 SENSITIVE Sensitive     * FEW SERRATIA MARCESCENS     Radiology Studies: No results found.  Scheduled Meds: . feeding supplement (ENSURE ENLIVE)  237 mL Oral TID BM  . multivitamin with minerals  1 tablet Oral Daily  . oxyCODONE  30 mg Oral Q12H  . polyethylene glycol  17 g Oral Daily  . senna-docusate  1 tablet Oral BID  . traZODone  100 mg Oral QHS   Continuous Infusions: . cefTRIAXone (ROCEPHIN)  IV 200 mL/hr at 10/05/18 2224     LOS: 6 days   Rickey Barbara, MD [facility] Pager On Amion  If 7PM-7AM, please contact night-coverage 10/06/2018, 2:58 PM

## 2018-10-06 NOTE — Evaluation (Deleted)
Physical Therapy Evaluation Patient Details Name: Dan [name] MRN: [id number] DOB: [birth year] Today's Date: 10/06/2018   History of Present Illness  47 yo male presented with back pain progressively worsening over the past month causing severe debilitation which he started using IV heroin to treat. 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 Neurosurgery consulted and recommend non-surgical approach with IV antibiotics. 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   Clinical Impression  Pt limited in mobility tolerance secondary to pain this session. Pt only agreeable to sitting EOB, despite education about importance of OOB mobility. Feel pt may be self limiting. Instructed pt to sit at EOB during meals. Pt agreeable to performing supine and seated HEP during session. Current recommendations appropriate. Will continue to follow acutely to maximize functional mobility independence and safety.     Follow Up Recommendations SNF;Supervision/Assistance - 24 hour    Equipment Recommendations  None recommended by PT    Recommendations for Other Services       Precautions / Restrictions Precautions Precautions: Fall;Back Precaution Booklet Issued: No Precaution Comments: Reviewed back precautions for pain management. Pt unable to remember precautions from previous session Restrictions Weight Bearing Restrictions: No      Mobility  Bed Mobility Overal bed mobility: Needs Assistance Bed Mobility: Sidelying to Sit;Rolling;Sit to Sidelying Rolling: Supervision Sidelying to sit: Min assist     Sit to sidelying: Supervision General bed mobility comments: Min A For trunk elevation. Increased time required.    Transfers                 General transfer comment: Pt refusing this session secondary to increased pain.   Ambulation/Gait                Stairs            Wheelchair Mobility    Modified Rankin (Stroke Patients Only)       Balance Overall balance assessment: Needs assistance Sitting-balance support: Bilateral upper extremity supported;Feet supported Sitting balance-Leahy Scale: Fair Sitting balance - Comments: Bracing at EOB secondary to pain.                                      Pertinent Vitals/Pain Pain Assessment: 0-10 Pain Score: 8  Pain Location: Back Pain Descriptors / Indicators: Constant;Discomfort;Grimacing Pain Intervention(s): Limited activity within patient's tolerance;Monitored during session;Repositioned    Home Living                        Prior Function                 Hand Dominance        Extremity/Trunk Assessment                Communication      Cognition Arousal/Alertness: Awake/alert Behavior During Therapy: WFL for tasks assessed/performed Overall Cognitive Status: Within Functional Limits for tasks assessed                                        General Comments General comments (skin integrity, edema,  etc.): Educated about importance of working with PT and improving mobility. Instructed to sit up at EOB for meals, and notified RN as well about instruction.     Exercises General Exercises - Lower Extremity Ankle Circles/Pumps: AROM;Both;20 reps Quad Sets: AROM;Both;5 reps;Seated Heel Slides: AROM;Both;5 reps;Supine   Assessment/Plan    PT Assessment    PT Problem List         PT Treatment Interventions      PT Goals (Current goals can be found in the Care Plan section)  Acute Rehab PT Goals Patient Stated Goal: for pain to get better PT Goal Formulation: With patient Time For Goal Achievement: 10/18/18 Potential to Achieve Goals: Fair     Frequency Min 3X/week   Barriers to discharge        Co-evaluation               AM-PAC PT "6 Clicks" Daily Activity  Outcome Measure Difficulty turning over in bed (including adjusting bedclothes, sheets and blankets)?: A Lot Difficulty moving from lying on back to sitting on the side of the bed? : Unable Difficulty sitting down on and standing up from a chair with arms (e.g., wheelchair, bedside commode, etc,.)?: Unable Help needed moving to and from a bed to chair (including a wheelchair)?: A Lot Help needed walking in hospital room?: Total Help needed climbing 3-5 steps with a railing? : Total 6 Click Score: 8    End of Session   Activity Tolerance: Patient limited by pain Patient left: in bed;with call bell/phone within reach Nurse Communication: Mobility status PT Visit Diagnosis: Other abnormalities of gait and mobility (R26.89);Muscle weakness (generalized) (M62.81);Difficulty in walking, not elsewhere classified (R26.2);Pain;History of falling (Z91.81) Pain - part of body: (back )    Time: 1610-9604 PT Time Calculation (min) (ACUTE ONLY): 10 min   Charges:     PT Treatments $Therapeutic Activity: 8-22 mins        Gladys Damme, PT, DPT  Acute Rehabilitation Services  Pager: [phone] Office: [phone]   Dan [name] 10/06/2018, 9:09 AM

## 2018-10-07 ENCOUNTER — Encounter (HOSPITAL_COMMUNITY): Payer: Self-pay | Admitting: *Deleted

## 2018-10-07 ENCOUNTER — Encounter (HOSPITAL_COMMUNITY): Payer: Self-pay | Admitting: Certified Registered Nurse Anesthetist

## 2018-10-07 ENCOUNTER — Inpatient Hospital Stay (HOSPITAL_COMMUNITY): Payer: Medicaid Other

## 2018-10-07 ENCOUNTER — Encounter (HOSPITAL_COMMUNITY)
Admission: EM | Disposition: A | Discharge status: Home Health | Payer: Self-pay | Source: Home / Self Care | Attending: Internal Medicine

## 2018-10-07 LAB — CBC
HCT: 27.7 % — ABNORMAL LOW (ref 39.0–52.0)
HEMOGLOBIN: 8.2 g/dL — AB (ref 13.0–17.0)
MCH: 25.5 pg — AB (ref 26.0–34.0)
MCHC: 29.6 g/dL — AB (ref 30.0–36.0)
MCV: 86.3 fL (ref 80.0–100.0)
PLATELETS: 81 10*3/uL — AB (ref 150–400)
RBC: 3.21 MIL/uL — AB (ref 4.22–5.81)
RDW: 17 % — ABNORMAL HIGH (ref 11.5–15.5)
WBC: 4 10*3/uL (ref 4.0–10.5)
nRBC: 0.5 % — ABNORMAL HIGH (ref 0.0–0.2)

## 2018-10-07 SURGERY — CANCELLED PROCEDURE

## 2018-10-07 MED ORDER — SODIUM CHLORIDE 0.9 % IV SOLN
INTRAVENOUS | Status: DC
  Administered 2018-10-07: 08:00:00 via INTRAVENOUS

## 2018-10-07 NOTE — Progress Notes (Signed)
History reviewed.    Pt scheduled for TEE today. With history of cirrhosis, thrombocytopenia, anemia it is a relative contraindication for TEE.   At the least he should have an EGD prior. Note,I saw him back in 2017 for TEE   Had EGD at that time   Portal gastropathy noted   No varices     I have rescheduled TEE for Monday with EGD just prior to procedure.  Dietrich Pates

## 2018-10-07 NOTE — Progress Notes (Signed)
PROGRESS NOTE    Dan [name]  ION:[id number] DOB: 1971-09-20 DOA: 09/29/2018 PCP: [name], NP    Brief Narrative:  47 y.o. 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 He was afebrile with normal WBC but imaging demonstrated thoracolumbar discitis/osteomyelitis at multiple levels with marked collapse/retropulsion of vertebral body at L3-4 with severe spinal stenosis and abscess extending to bilateral psoas muscles. Neurosurgery was consulted, though the patient has no myelopathic symptoms or signs and medical management was recommended. ID was consulted, recommended vancomycin and ceftriaxone following abscess sampling which was performed 10/19. Abscess and urine cultures grew Serratia, though blood cultures oddly remain negative. 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 ?Hematogenous spread but NG on blood Cx. With IVDU, will need TEE per discussion with ID.  - Follow blood cultures - Consulted cardiology for TEE, cannot do without EGD first to r/o varicies. Now planned for . - Continue abx per ID recommendations. Likely eventually home on levofloxacin or cipro x 6-8weeks -Presently stable  L3-4  vertebral body collapse, retropulsion causing severe spinal stenosis: No myelopathy. Neurosurgery consulted, recommending IV antibiotics, no surgery.  - PT/OT continued - Pain control: Significant tolerance, so on oxycontin 30mg  q12h and oxyIR 10-20mg  q3h prn mod/sev pain. Discussed with patient that we anticipate deescalation as infection is treated.  -continue abx as per above  Liver cirrhosis with ascites, portal hypertensive gastropathy, history of bleeding gastric ulcer, thrombocytopenia: Normal mentation without evidence of GI bleeding at present. Pt denies nausea, abd pain.  - Avoid tylenol.  -Continue to follow CMP -Hgb currently 8.2. No obvious blood loss. Repeat CBC in AM  Polysubstance abuse, IVDU:  - Cessation counseling provided.  - Will NOT be a candidate for home health IV antibiotics - Remains stable at present  Hepatitis B and C:  - Serologies reviewed -Cont as per ID recs  Moderate protein calorie malnutrition:  - Protein supplemented - Continue as per nutrition  Anemia of chronic disease:  - No obvious blood loss noted - Repeat CBC in aM  DVT prophylaxis: SCD's Code Status: Full Family Communication: Pt in room, family not at bedside Disposition Plan: Uncertain at this time  Consultants:   Neurosurgery, Dr. Maurice Small  Infectious Diseases, Dr. Drue Second  Interventional radiology, Dr. Loreta Ave  Procedures:   Imaging guided L3-L4 disc aspiration for sample 10/01/2018 by Dr. Loreta Ave.  Echocardiogram 10/01/2018: - Left ventricle: The cavity size was normal. Ing thickness was normal. Systolic function was normal. The estimated ejection fraction was in the range of 60% to 65%. Mathe motion was normal; there were no regional Stauffer motion abnormalities. Left ventricular diastolic function parameters were normal. - Mitral valve: Mildly thickened leaflets . There was trace to mild  regurgitation. - Left atrium: The atrium was normal in size. -  Inferior vena cava: The vessel was normal in size. The respirophasic diameter changes were in the normal range (= 50%), consistent with normal central venous pressure.  Impressions:  - Compared to a prior study in 2017, there have been no changes. No vegetation is noted.  Antimicrobials: Anti-infectives (From admission, onward)   Start     Dose/Rate Route Frequency Ordered Stop   10/06/18 1800  tenofovir (VIREAD) tablet 300 mg     300 mg Oral Daily 10/06/18 1530     10/06/18 0000  Tenofovir Alafenamide Fumarate (VEMLIDY) 25 MG TABS     1 tablet Oral Daily 10/06/18 1500     10/01/18 2000  vancomycin (VANCOCIN) IVPB 1000 mg/200 mL premix  Status:  Discontinued     1,000 mg 200 mL/hr over 60 Minutes Intravenous Every 8 hours 10/01/18 1903 10/03/18 0939   10/01/18 1900  cefTRIAXone (ROCEPHIN) 2 g in sodium chloride 0.9 % 100 mL IVPB     2 g 200 mL/hr over 30 Minutes Intravenous Every 24 hours 10/01/18 1857     09/29/18 2315  piperacillin-tazobactam (ZOSYN) IVPB 3.375 g     3.375 g 100 mL/hr over 30 Minutes Intravenous NOW 09/29/18 2306 09/29/18 2359   09/29/18 2315  vancomycin (VANCOCIN) 1,500 mg in sodium chloride 0.9 % 500 mL IVPB     1,500 mg 250 mL/hr over 120 Minutes Intravenous  Once 09/29/18 2306 09/30/18 0310      Subjective: No complaints this AM  Objective: Vitals:   10/06/18 1431 10/06/18 2112 10/07/18 0526 10/07/18 0730  BP: 101/66 113/65 97/71 109/75  Pulse: 94  94 96  Resp: 16 16 17 15   Temp: 98.3 F (36.8 C) 98 F (36.7 C) 98 F (36.7 C) 98.4 F (36.9 C)  TempSrc: Oral Oral Oral Oral  SpO2: 97% 97% 95% 94%  Weight:    72.8 kg  Height:    5\' 11"  (1.803 m)    Intake/Output Summary (Last 24 hours) at 10/07/2018 1526 Last data filed at 10/07/2018 1504 Gross per 24 hour  Intake 1141.4 ml  Output 2570 ml  Net -1428.6 ml   Filed Weights   09/29/18 1828 10/01/18 1157 10/07/18 0730  Weight: 63.5 kg 72.8 kg 72.8 kg    Examination: General exam:  Conversant, in no acute distress Respiratory system: normal chest rise, clear, no audible wheezing Cardiovascular system: regular rhythm, s1-s2 Gastrointestinal system: Nondistended, nontender, pos BS Central nervous system: No seizures, no tremors Extremities: No cyanosis, no joint deformities Skin: No rashes, no pallor Psychiatry: Affect normal // no auditory hallucinations   Data Reviewed: I have personally reviewed following labs and imaging studies  CBC: Recent Labs  Lab 10/01/18 0449 10/03/18 0513 10/06/18 0247 10/07/18 0240  WBC 4.4 4.0 3.5* 4.0  HGB 9.5* 9.4* 8.6* 8.2*  HCT 30.9* 30.6* 28.9* 27.7*  MCV 84.0 84.3 86.5 86.3  PLT 88* 83* 77* 81*   Basic Metabolic Panel: Recent Labs  Lab 10/01/18 0449 10/03/18 0513 10/06/18 0247  NA 136 134* 135  K 4.3 4.8 4.9  CL 107 103 101  CO2 22 25 28   GLUCOSE 114* 144* 113*  BUN 12 25* 29*  CREATININE 0.80 0.89 0.76  CALCIUM 7.7* 7.9* 8.3*   GFR: Estimated Creatinine Clearance: 115 mL/min (by C-G formula based on SCr of 0.76 mg/dL). Liver Function Tests: Recent Labs  Lab 10/01/18 0449 10/06/18 0247  AST 16 46*  ALT 13 35  ALKPHOS 122 144*  BILITOT 0.3 0.3  PROT 6.1* 6.6  ALBUMIN 1.9* 2.0*   No results for input(s): LIPASE, AMYLASE in the last 168 hours. No results for input(s): AMMONIA in the last 168 hours. Coagulation Profile: Recent Labs  Lab 09/30/18 1618  INR 1.24   Cardiac Enzymes: No results for input(s): CKTOTAL, CKMB, CKMBINDEX, TROPONINI in the last 168 hours. BNP (last 3 results) No results for input(s): PROBNP in the last 8760 hours. HbA1C: No results for input(s): HGBA1C in the last 72 hours. CBG: No results for input(s): GLUCAP in the last 168 hours. Lipid Profile: No results for input(s): CHOL, HDL, LDLCALC, TRIG, CHOLHDL, LDLDIRECT in the last 72 hours. Thyroid Function Tests: No results for input(s): TSH, T4TOTAL, FREET4, T3FREE, THYROIDAB in the last 72 hours. Anemia Panel: No  results for input(s): VITAMINB12, FOLATE, FERRITIN, TIBC, IRON, RETICCTPCT in the last 72 hours. Sepsis Labs: No results for input(s): PROCALCITON, LATICACIDVEN in the last 168 hours.  Recent Results (from the past 240 hour(s))  Blood Culture (routine x 2)     Status: None   Collection Time: 09/29/18 11:32 PM  Result Value Ref Range Status   Specimen Description   Final    BLOOD LEFT HAND Performed at [facility], [address] [place], [place] [address]    Special Requests   Final    BOTTLES DRAWN AEROBIC AND ANAEROBIC Blood Culture results may not be optimal due to an inadequate volume of blood received in culture bottles Performed at [facility], [address], [place], [place] [address]    Culture   Final    NO GROWTH 5 DAYS Performed at [facility], [address] [place], [place] [address]    Report Status 10/05/2018 FINAL  Final  Blood Culture (routine x 2)     Status: None   Collection Time: 09/29/18 11:32 PM  Result Value Ref Range Status   Specimen Description   Final    BLOOD RIGHT FOREARM Performed at [facility], [address], [place], [place] [address]    Special Requests   Final    BOTTLES DRAWN AEROBIC AND ANAEROBIC Blood Culture results may not be optimal due to an inadequate volume of blood received in culture bottles Performed at [facility], [address], [place], [place] [address]    Culture   Final    NO GROWTH 5 DAYS Performed at [facility], [address], [place], [place] [address]    Report Status 10/05/2018 FINAL  Final  Urine culture     Status: Abnormal   Collection Time: 09/29/18 11:32 PM  Result Value Ref Range Status   Specimen Description   Final    URINE, CLEAN CATCH Performed at [facility], [address], [place], [place] [address]    Special Requests   Final    NONE Performed at [facility], [address]  [place], [place] [address]    Culture >=100,000 COLONIES/mL SERRATIA MARCESCENS (A)  Final   Report Status 10/02/2018 FINAL  Final   Organism ID, Bacteria SERRATIA MARCESCENS (A)  Final      Susceptibility   Serratia marcescens - MIC*    CEFAZOLIN >=64 RESISTANT Resistant     CEFTRIAXONE <=1 SENSITIVE Sensitive     CIPROFLOXACIN <=0.25 SENSITIVE Sensitive     GENTAMICIN <=1 SENSITIVE Sensitive     NITROFURANTOIN 128 RESISTANT Resistant  TRIMETH/SULFA <=20 SENSITIVE Sensitive     * >=100,000 COLONIES/mL SERRATIA MARCESCENS  Aerobic/Anaerobic Culture (surgical/deep wound)     Status: None   Collection Time: 10/01/18 10:26 AM  Result Value Ref Range Status   Specimen Description ABSCESS VERTEBRA L3 L4  Final   Special Requests NONE  Final   Gram Stain   Final    FEW WBC PRESENT, PREDOMINANTLY PMN NO ORGANISMS SEEN    Culture   Final    FEW SERRATIA MARCESCENS NO ANAEROBES ISOLATED Performed at [facility], [address], [place], [place] [address]    Report Status 10/06/2018 FINAL  Final   Organism ID, Bacteria SERRATIA MARCESCENS  Final      Susceptibility   Serratia marcescens - MIC*    CEFAZOLIN >=64 RESISTANT Resistant     CEFEPIME <=1 SENSITIVE Sensitive     CEFTAZIDIME <=1 SENSITIVE Sensitive     CEFTRIAXONE <=1 SENSITIVE Sensitive     CIPROFLOXACIN <=0.25 SENSITIVE Sensitive     GENTAMICIN <=1 SENSITIVE Sensitive     TRIMETH/SULFA <=20 SENSITIVE Sensitive     * FEW SERRATIA MARCESCENS     Radiology Studies: No results found.  Scheduled Meds: . feeding supplement (ENSURE ENLIVE)  237 mL Oral TID BM  . multivitamin with minerals  1 tablet Oral Daily  . oxyCODONE  30 mg Oral Q12H  . polyethylene glycol  17 g Oral Daily  . senna-docusate  1 tablet Oral BID  . tenofovir  300 mg Oral Daily  . traZODone  100 mg Oral QHS   Continuous Infusions: . cefTRIAXone (ROCEPHIN)  IV Stopped (10/06/18 1932)     LOS: 7 days   Rickey Barbara, MD [facility] Pager On Amion  If 7PM-7AM, please contact night-coverage 10/07/2018, 3:26 PM

## 2018-10-07 NOTE — Plan of Care (Signed)
  Problem: Pain Managment: Goal: General experience of comfort will improve Outcome: Progressing   Problem: Safety: Goal: Ability to remain free from injury will improve Outcome: Progressing   

## 2018-10-07 NOTE — Anesthesia Preprocedure Evaluation (Deleted)
Anesthesia Evaluation    Reviewed: Allergy & Precautions, Patient's Chart, lab work & pertinent test results  History of Anesthesia Complications Negative for: history of anesthetic complications  Airway Mallampati: II  TM Distance: <3 FB Neck ROM: Full    Dental  (+) Poor Dentition, Chipped, Missing,    Pulmonary Current Smoker,    breath sounds clear to auscultation       Cardiovascular negative cardio ROS       Neuro/Psych PSYCHIATRIC DISORDERS Anxiety Depression  Neuromuscular disease (chronic back pain. Reports blurry L eye vision prior to hospital admission. Denies hx of stroke)    GI/Hepatic PUD, (+) Cirrhosis       , Hepatitis -, B, C  Endo/Other  negative endocrine ROS  Renal/GU negative Renal ROS     Musculoskeletal  (+) Arthritis , Osteoarthritis,    Abdominal   Peds  Hematology negative hematology ROS (+)   Anesthesia Other Findings   Reproductive/Obstetrics                            Anesthesia Physical Anesthesia Plan  ASA: III  Anesthesia Plan: MAC   Post-op Pain Management:    Induction: Intravenous  PONV Risk Score and Plan: 2 and Ondansetron and Propofol infusion  Airway Management Planned: Nasal Cannula and Natural Airway  Additional Equipment: None  Intra-op Plan:   Post-operative Plan: Extubation in OR  Informed Consent:   Plan Discussed with: CRNA  Anesthesia Plan Comments:         Anesthesia Quick Evaluation

## 2018-10-08 NOTE — Progress Notes (Signed)
PROGRESS NOTE    Dan [name]  ZOX:[id number] DOB: 01/01/1971 DOA: 09/29/2018 PCP: [name], NP    Brief Narrative:  47 y.o. 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 He was afebrile with normal WBC but imaging demonstrated thoracolumbar discitis/osteomyelitis at multiple levels with marked collapse/retropulsion of vertebral body at L3-4 with severe spinal stenosis and abscess extending to bilateral psoas muscles. Neurosurgery was consulted, though the patient has no myelopathic symptoms or signs and medical management was recommended. ID was consulted, recommended vancomycin and ceftriaxone following abscess sampling which was performed 10/19. Abscess and urine cultures grew Serratia, though blood cultures oddly remain negative. 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 ?Hematogenous spread but NG on blood Cx. With IVDU, will need TEE per discussion with ID.  - Follow blood cultures - Consulted cardiology for TEE, cannot do without EGD first to r/o varicies. Now planned for 10/10/18. - Continue abx per ID recommendations. Likely eventually home on levofloxacin or cipro x 6-8weeks -Currently  stable  L3-4 vertebral body collapse, retropulsion causing severe spinal stenosis: No myelopathy. Neurosurgery consulted, recommending IV antibiotics, no surgery.  - PT/OT continued - Pain control: Significant tolerance, so on oxycontin 30mg  q12h and oxyIR 10-20mg  q3h prn mod/sev pain. Discussed with patient that we anticipate deescalation as infection is treated.  -continue abx as per above  Liver cirrhosis with ascites, portal hypertensive gastropathy, history of bleeding gastric ulcer, thrombocytopenia: Normal mentation without evidence of GI bleeding at present. Pt denies nausea, abd pain.  - Avoid tylenol.  -Continue to follow CMP -No obvious blood loss. Repeat CBC in AM  Polysubstance abuse, IVDU:  - Cessation counseling provided.  - Will NOT be a candidate for home health IV antibiotics - Remains stable at present  Hepatitis B and C:  - Serologies reviewed -Cont as per ID recs  Moderate protein calorie malnutrition:  - Protein supplemented - Continue as per nutrition  Anemia of chronic disease:  - No obvious blood loss noted - Repeat CBC in aM  DVT prophylaxis: SCD's Code Status: Full Family Communication: Pt in room, family not at bedside Disposition Plan: Uncertain at this time  Consultants:   Neurosurgery, Dr. Maurice Small  Infectious Diseases, Dr. Drue Second  Interventional radiology, Dr. Loreta Ave  Procedures:   Imaging guided L3-L4 disc aspiration for sample 10/01/2018 by Dr. Loreta Ave.  Echocardiogram 10/01/2018: - Left ventricle: The cavity size was normal. Luhn thickness was normal. Systolic function was normal. The estimated ejection fraction was in the range of 60% to 65%. Hor motion was normal; there were no regional Vanderhoef motion abnormalities. Left ventricular diastolic function parameters were normal. - Mitral valve: Mildly thickened leaflets . There was trace to mild regurgitation. - Left  atrium: The atrium was normal in size. -  Inferior vena cava: The vessel was normal in size. The respirophasic diameter changes were in the normal range (= 50%), consistent with normal central venous pressure.  Impressions:  - Compared to a prior study in 2017, there have been no changes. No vegetation is noted.  Antimicrobials: Anti-infectives (From admission, onward)   Start     Dose/Rate Route Frequency Ordered Stop   10/06/18 1800  tenofovir (VIREAD) tablet 300 mg     300 mg Oral Daily 10/06/18 1530     10/06/18 0000  Tenofovir Alafenamide Fumarate (VEMLIDY) 25 MG TABS     1 tablet Oral Daily 10/06/18 1500     10/01/18 2000  vancomycin (VANCOCIN) IVPB 1000 mg/200 mL premix  Status:  Discontinued     1,000 mg 200 mL/hr over 60 Minutes Intravenous Every 8 hours 10/01/18 1903 10/03/18 0939   10/01/18 1900  cefTRIAXone (ROCEPHIN) 2 g in sodium chloride 0.9 % 100 mL IVPB     2 g 200 mL/hr over 30 Minutes Intravenous Every 24 hours 10/01/18 1857     09/29/18 2315  piperacillin-tazobactam (ZOSYN) IVPB 3.375 g     3.375 g 100 mL/hr over 30 Minutes Intravenous NOW 09/29/18 2306 09/29/18 2359   09/29/18 2315  vancomycin (VANCOCIN) 1,500 mg in sodium chloride 0.9 % 500 mL IVPB     1,500 mg 250 mL/hr over 120 Minutes Intravenous  Once 09/29/18 2306 09/30/18 0310      Subjective: Eager to have TEE  Objective: Vitals:   10/07/18 1707 10/07/18 2101 10/08/18 0453 10/08/18 1228  BP: 107/69 94/64 98/62  106/64  Pulse: 93 93 90 82  Resp: 12   20  Temp: 98.1 F (36.7 C) 98.2 F (36.8 C) 97.9 F (36.6 C) 98.3 F (36.8 C)  TempSrc: Oral Oral Oral Oral  SpO2: 97% 96% 96% 97%  Weight:      Height:        Intake/Output Summary (Last 24 hours) at 10/08/2018 1507 Last data filed at 10/08/2018 1416 Gross per 24 hour  Intake 720 ml  Output 2075 ml  Net -1355 ml   Filed Weights   09/29/18 1828 10/01/18 1157 10/07/18 0730  Weight: 63.5 kg 72.8 kg 72.8 kg    Examination: General exam: Conversant, in no acute  distress Respiratory system: normal chest rise, clear, no audible wheezing  Data Reviewed: I have personally reviewed following labs and imaging studies  CBC: Recent Labs  Lab 10/03/18 0513 10/06/18 0247 10/07/18 0240  WBC 4.0 3.5* 4.0  HGB 9.4* 8.6* 8.2*  HCT 30.6* 28.9* 27.7*  MCV 84.3 86.5 86.3  PLT 83* 77* 81*   Basic Metabolic Panel: Recent Labs  Lab 10/03/18 0513 10/06/18 0247  NA 134* 135  K 4.8 4.9  CL 103 101  CO2 25 28  GLUCOSE 144* 113*  BUN 25* 29*  CREATININE 0.89 0.76  CALCIUM 7.9* 8.3*   GFR: Estimated Creatinine Clearance: 115 mL/min (by C-G formula based on SCr of 0.76 mg/dL). Liver Function Tests: Recent Labs  Lab 10/06/18 0247  AST 46*  ALT 35  ALKPHOS 144*  BILITOT 0.3  PROT 6.6  ALBUMIN 2.0*   No results for input(s): LIPASE, AMYLASE in the last 168 hours. No results for input(s): AMMONIA in the last 168 hours. Coagulation Profile: No results for input(s): INR, PROTIME in the last 168 hours. Cardiac Enzymes: No results for input(s): CKTOTAL, CKMB, CKMBINDEX, TROPONINI in the last 168 hours. BNP (last  3 results) No results for input(s): PROBNP in the last 8760 hours. HbA1C: No results for input(s): HGBA1C in the last 72 hours. CBG: No results for input(s): GLUCAP in the last 168 hours. Lipid Profile: No results for input(s): CHOL, HDL, LDLCALC, TRIG, CHOLHDL, LDLDIRECT in the last 72 hours. Thyroid Function Tests: No results for input(s): TSH, T4TOTAL, FREET4, T3FREE, THYROIDAB in the last 72 hours. Anemia Panel: No results for input(s): VITAMINB12, FOLATE, FERRITIN, TIBC, IRON, RETICCTPCT in the last 72 hours. Sepsis Labs: No results for input(s): PROCALCITON, LATICACIDVEN in the last 168 hours.  Recent Results (from the past 240 hour(s))  Blood Culture (routine x 2)     Status: None   Collection Time: 09/29/18 11:32 PM  Result Value Ref Range Status   Specimen Description   Final    BLOOD LEFT HAND Performed at [facility], [address], [place], [place] [address]    Special Requests   Final    BOTTLES DRAWN AEROBIC AND ANAEROBIC Blood Culture results may not be optimal due to an inadequate volume of blood received in culture bottles Performed at [facility], [address], [place], [place] [address]    Culture   Final    NO GROWTH 5 DAYS Performed at [facility], [address], [place], [place] [address]    Report Status 10/05/2018 FINAL  Final  Blood Culture (routine x 2)     Status: None   Collection Time: 09/29/18 11:32 PM  Result Value Ref Range Status   Specimen Description   Final    BLOOD RIGHT FOREARM Performed at [facility], [address], [place], [place] [address]    Special Requests   Final    BOTTLES DRAWN AEROBIC AND ANAEROBIC Blood Culture results may not be optimal due to an inadequate volume of blood received in culture bottles Performed at [facility], [address] [place], [place] [address]    Culture   Final    NO GROWTH 5 DAYS Performed at [facility], [address] [place], [place] [address]    Report Status 10/05/2018 FINAL  Final  Urine culture     Status: Abnormal   Collection Time: 09/29/18 11:32 PM  Result Value Ref Range Status   Specimen Description   Final    URINE, CLEAN CATCH Performed at [facility], [address], [place], [place] [address]    Special Requests   Final    NONE Performed at [facility], [address] [place], [place] [address]    Culture >=100,000 COLONIES/mL SERRATIA MARCESCENS (A)  Final   Report Status 10/02/2018 FINAL  Final   Organism ID, Bacteria SERRATIA MARCESCENS (A)  Final      Susceptibility   Serratia marcescens - MIC*    CEFAZOLIN >=64 RESISTANT Resistant     CEFTRIAXONE <=1 SENSITIVE Sensitive     CIPROFLOXACIN <=0.25 SENSITIVE Sensitive     GENTAMICIN <=1 SENSITIVE Sensitive     NITROFURANTOIN 128  RESISTANT Resistant     TRIMETH/SULFA <=20 SENSITIVE Sensitive     * >=100,000 COLONIES/mL SERRATIA MARCESCENS  Aerobic/Anaerobic Culture (surgical/deep wound)     Status: None   Collection Time: 10/01/18 10:26 AM  Result Value Ref Range Status   Specimen Description ABSCESS VERTEBRA L3 L4  Final   Special Requests NONE  Final   Gram Stain   Final    FEW WBC PRESENT,  PREDOMINANTLY PMN NO ORGANISMS SEEN    Culture   Final    FEW SERRATIA MARCESCENS NO ANAEROBES ISOLATED Performed at [facility], [address], [place], [place] [address]    Report Status 10/06/2018 FINAL  Final   Organism ID, Bacteria SERRATIA MARCESCENS  Final      Susceptibility   Serratia marcescens - MIC*    CEFAZOLIN >=64 RESISTANT Resistant     CEFEPIME <=1 SENSITIVE Sensitive     CEFTAZIDIME <=1 SENSITIVE Sensitive     CEFTRIAXONE <=1 SENSITIVE Sensitive     CIPROFLOXACIN <=0.25 SENSITIVE Sensitive     GENTAMICIN <=1 SENSITIVE Sensitive     TRIMETH/SULFA <=20 SENSITIVE Sensitive     * FEW SERRATIA MARCESCENS     Radiology Studies: No results found.  Scheduled Meds: . feeding supplement (ENSURE ENLIVE)  237 mL Oral TID BM  . multivitamin with minerals  1 tablet Oral Daily  . oxyCODONE  30 mg Oral Q12H  . polyethylene glycol  17 g Oral Daily  . senna-docusate  1 tablet Oral BID  . tenofovir  300 mg Oral Daily  . traZODone  100 mg Oral QHS   Continuous Infusions: . cefTRIAXone (ROCEPHIN)  IV 2 g (10/07/18 2117)     LOS: 8 days   Rickey Barbara, MD [facility] Pager On Amion  If 7PM-7AM, please contact night-coverage 10/08/2018, 3:07 PM

## 2018-10-09 NOTE — H&P (View-Only) (Signed)
PROGRESS NOTE    Dan [name]  MRN:[id number] DOB: 09/14/1971 DOA: 09/29/2018 PCP: York, [name], NP    Brief Narrative:  47 y.o. 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 He was afebrile with normal WBC but imaging demonstrated thoracolumbar discitis/osteomyelitis at multiple levels with marked collapse/retropulsion of vertebral body at L3-4 with severe spinal stenosis and abscess extending to bilateral psoas muscles. Neurosurgery was consulted, though the patient has no myelopathic symptoms or signs and medical management was recommended. ID was consulted, recommended vancomycin and ceftriaxone following abscess sampling which was performed 10/19. Abscess and urine cultures grew Serratia, though blood cultures oddly remain negative. 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 ?Hematogenous spread but NG on blood Cx. With IVDU, will need TEE per discussion with ID.  - Follow blood cultures - Consulted cardiology for TEE, cannot do without EGD first to r/o varicies. Now planned for 10/10/18. - Continue abx per ID recommendations. Likely eventually home on levofloxacin or cipro x 6-8weeks -Remains stable at this  time  L3-4 vertebral body collapse, retropulsion causing severe spinal stenosis: No myelopathy. Neurosurgery consulted, recommending IV antibiotics, no surgery.  - PT/OT continued - Pain control: Significant tolerance, so on oxycontin 30mg q12h and oxyIR 10-20mg q3h prn mod/sev pain. Discussed with patient that we anticipate deescalation as infection is treated.  -continue abx as per above  Liver cirrhosis with ascites, portal hypertensive gastropathy, history of bleeding gastric ulcer, thrombocytopenia: Normal mentation without evidence of GI bleeding at present. Pt denies nausea, abd pain.  - Avoid tylenol.  -Continue to follow CMP -No obvious blood loss. Repeat CBC in AM  Polysubstance abuse, IVDU:  - Cessation counseling provided.  - Will NOT be a candidate for home health IV antibiotics - Remains stable at present  Hepatitis B and C:  - Serologies reviewed -Cont as per ID recs  Moderate protein calorie malnutrition:  - Protein supplemented - Continue as per nutrition  Anemia of chronic disease:  - No obvious blood loss noted - Repeat CBC in aM  DVT prophylaxis: SCD's Code Status: Full Family Communication: Pt in room, family not at bedside Disposition Plan: Uncertain at this time  Consultants:   Neurosurgery, Dr. Ostergard  Infectious Diseases, Dr. Snider  Interventional radiology, Dr. Wagner  Procedures:   Imaging guided L3-L4 disc aspiration for sample 10/01/2018 by Dr. Wagner.  Echocardiogram 10/01/2018: - Left ventricle: The cavity size was normal. Jacobson thickness was normal. Systolic function was normal. The estimated ejection fraction was in the range of 60% to 65%. Fratto motion was normal; there were no regional Boccio motion abnormalities. Left ventricular diastolic function parameters were normal. - Mitral valve: Mildly thickened leaflets . There was trace to mild regurgitation. -   Left atrium: The atrium was normal in size. - Inferior  vena cava: The vessel was normal in size. The respirophasic diameter changes were in the normal range (= 50%), consistent with normal central venous pressure.  Impressions:  - Compared to a prior study in 2017, there have been no changes. No vegetation is noted.  Antimicrobials: Anti-infectives (From admission, onward)   Start     Dose/Rate Route Frequency Ordered Stop   10/06/18 1800  tenofovir (VIREAD) tablet 300 mg     300 mg Oral Daily 10/06/18 1530     10/06/18 0000  Tenofovir Alafenamide Fumarate (VEMLIDY) 25 MG TABS     1 tablet Oral Daily 10/06/18 1500     10/01/18 2000  vancomycin (VANCOCIN) IVPB 1000 mg/200 mL premix  Status:  Discontinued     1,000 mg 200 mL/hr over 60 Minutes Intravenous Every 8 hours 10/01/18 1903 10/03/18 0939   10/01/18 1900  cefTRIAXone (ROCEPHIN) 2 g in sodium chloride 0.9 % 100 mL IVPB     2 g 200 mL/hr over 30 Minutes Intravenous Every 24 hours 10/01/18 1857     09/29/18 2315  piperacillin-tazobactam (ZOSYN) IVPB 3.375 g     3.375 g 100 mL/hr over 30 Minutes Intravenous NOW 09/29/18 2306 09/29/18 2359   09/29/18 2315  vancomycin (VANCOCIN) 1,500 mg in sodium chloride 0.9 % 500 mL IVPB     1,500 mg 250 mL/hr over 120 Minutes Intravenous  Once 09/29/18 2306 09/30/18 0310      Subjective: Pt reports looking forward to proceeding with TEE  Objective: Vitals:   10/08/18 0453 10/08/18 1228 10/08/18 2029 10/09/18 0400  BP: 98/62 106/64 108/63 113/83  Pulse: 90 82 87 80  Resp:  20    Temp: 97.9 F (36.6 C) 98.3 F (36.8 C) 98.1 F (36.7 C) 98.2 F (36.8 C)  TempSrc: Oral Oral Oral Oral  SpO2: 96% 97% 98% 96%  Weight:      Height:        Intake/Output Summary (Last 24 hours) at 10/09/2018 1549 Last data filed at 10/09/2018 1000 Gross per 24 hour  Intake -  Output 800 ml  Net -800 ml   Filed Weights   09/29/18 1828 10/01/18 1157 10/07/18 0730  Weight: 63.5 kg 72.8 kg 72.8 kg    Examination: General exam: Awake, laying  in bed, in nad Respiratory system: Normal respiratory effort, no wheezing  Data Reviewed: I have personally reviewed following labs and imaging studies  CBC: Recent Labs  Lab 10/03/18 0513 10/06/18 0247 10/07/18 0240  WBC 4.0 3.5* 4.0  HGB 9.4* 8.6* 8.2*  HCT 30.6* 28.9* 27.7*  MCV 84.3 86.5 86.3  PLT 83* 77* 81*   Basic Metabolic Panel: Recent Labs  Lab 10/03/18 0513 10/06/18 0247  NA 134* 135  K 4.8 4.9  CL 103 101  CO2 25 28  GLUCOSE 144* 113*  BUN 25* 29*  CREATININE 0.89 0.76  CALCIUM 7.9* 8.3*   GFR: Estimated Creatinine Clearance: 115 mL/min (by C-G formula based on SCr of 0.76 mg/dL). Liver Function Tests: Recent Labs  Lab 10/06/18 0247  AST 46*  ALT 35  ALKPHOS 144*  BILITOT 0.3  PROT 6.6  ALBUMIN 2.0*   No results for input(s): LIPASE, AMYLASE in the last 168 hours. No results for input(s): AMMONIA in the last 168 hours. Coagulation Profile: No results for input(s): INR, PROTIME in the last 168 hours. Cardiac Enzymes: No results for input(s): CKTOTAL, CKMB, CKMBINDEX, TROPONINI in the last 168   hours. BNP (last 3 results) No results for input(s): PROBNP in the last 8760 hours. HbA1C: No results for input(s): HGBA1C in the last 72 hours. CBG: No results for input(s): GLUCAP in the last 168 hours. Lipid Profile: No results for input(s): CHOL, HDL, LDLCALC, TRIG, CHOLHDL, LDLDIRECT in the last 72 hours. Thyroid Function Tests: No results for input(s): TSH, T4TOTAL, FREET4, T3FREE, THYROIDAB in the last 72 hours. Anemia Panel: No results for input(s): VITAMINB12, FOLATE, FERRITIN, TIBC, IRON, RETICCTPCT in the last 72 hours. Sepsis Labs: No results for input(s): PROCALCITON, LATICACIDVEN in the last 168 hours.  Recent Results (from the past 240 hour(s))  Blood Culture (routine x 2)     Status: None   Collection Time: 09/29/18 11:32 PM  Result Value Ref Range Status   Specimen Description   Final    BLOOD LEFT HAND Performed at [facility], [address] [place], [place] [address]    Special Requests   Final    BOTTLES DRAWN AEROBIC AND ANAEROBIC Blood Culture results may not be optimal due to an inadequate volume of blood received in culture bottles Performed at [facility], [address] [place], [place] [address]    Culture   Final    NO GROWTH 5 DAYS Performed at [facility], [address] [place], [place] [address]    Report Status 10/05/2018 FINAL  Final  Blood Culture (routine x 2)     Status: None   Collection Time: 09/29/18 11:32 PM  Result Value Ref Range Status   Specimen Description   Final    BLOOD RIGHT FOREARM Performed at [facility], [address] [place], [place] [address]    Special Requests   Final    BOTTLES DRAWN AEROBIC AND ANAEROBIC Blood Culture results may not be optimal due to an inadequate volume of blood received in culture bottles Performed at [facility], [address] [place], [place] [address]    Culture   Final    NO GROWTH 5 DAYS Performed at [facility], [address] [place], [place] [address]    Report Status 10/05/2018 FINAL  Final  Urine culture     Status: Abnormal   Collection Time: 09/29/18 11:32 PM  Result Value Ref Range Status   Specimen Description   Final    URINE, CLEAN CATCH Performed at [facility], [address] [place], [place] [address]    Special Requests   Final    NONE Performed at [facility], [address] [place],  [address]    Culture >=100,000 COLONIES/mL SERRATIA MARCESCENS (A)  Final   Report Status 10/02/2018 FINAL  Final   Organism ID, Bacteria SERRATIA MARCESCENS (A)  Final      Susceptibility   Serratia marcescens - MIC*    CEFAZOLIN >=64 RESISTANT Resistant     CEFTRIAXONE <=1 SENSITIVE Sensitive     CIPROFLOXACIN <=0.25 SENSITIVE Sensitive     GENTAMICIN <=1 SENSITIVE Sensitive     NITROFURANTOIN 128  RESISTANT Resistant     TRIMETH/SULFA <=20 SENSITIVE Sensitive     * >=100,000 COLONIES/mL SERRATIA MARCESCENS  Aerobic/Anaerobic Culture (surgical/deep wound)     Status: None   Collection Time: 10/01/18 10:26 AM  Result Value Ref Range Status   Specimen Description ABSCESS VERTEBRA L3 L4  Final   Special Requests NONE  Final   Gram Stain   Final      FEW WBC PRESENT, PREDOMINANTLY PMN NO ORGANISMS SEEN    Culture   Final    FEW SERRATIA MARCESCENS NO ANAEROBES ISOLATED Performed at [facility], [address] [place], [place] [address]    Report Status 10/06/2018 FINAL  Final   Organism ID, Bacteria SERRATIA MARCESCENS  Final      Susceptibility   Serratia marcescens - MIC*    CEFAZOLIN >=64 RESISTANT Resistant     CEFEPIME <=1 SENSITIVE Sensitive     CEFTAZIDIME <=1 SENSITIVE Sensitive     CEFTRIAXONE <=1 SENSITIVE Sensitive     CIPROFLOXACIN <=0.25 SENSITIVE Sensitive     GENTAMICIN <=1 SENSITIVE Sensitive     TRIMETH/SULFA <=20 SENSITIVE Sensitive     * FEW SERRATIA MARCESCENS     Radiology Studies: No results found.  Scheduled Meds: . feeding supplement (ENSURE ENLIVE)  237 mL Oral TID BM  . multivitamin with minerals  1 tablet Oral Daily  . oxyCODONE  30 mg Oral Q12H  . polyethylene glycol  17 g Oral Daily  . senna-docusate  1 tablet Oral BID  . tenofovir  300 mg Oral Daily  . traZODone  100 mg Oral QHS   Continuous Infusions: . cefTRIAXone (ROCEPHIN)  IV 2 g (10/08/18 2150)     LOS: 9 days   Faaris Arizpe, MD [facility] Pager On Amion  If 7PM-7AM, please contact night-coverage 10/09/2018, 3:49 PM    

## 2018-10-09 NOTE — Progress Notes (Signed)
PROGRESS NOTE    Dan [name]  DGL:[id number] DOB: 08/30/1971 DOA: 09/29/2018 PCP: [name], NP    Brief Narrative:  47 y.o. 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 He was afebrile with normal WBC but imaging demonstrated thoracolumbar discitis/osteomyelitis at multiple levels with marked collapse/retropulsion of vertebral body at L3-4 with severe spinal stenosis and abscess extending to bilateral psoas muscles. Neurosurgery was consulted, though the patient has no myelopathic symptoms or signs and medical management was recommended. ID was consulted, recommended vancomycin and ceftriaxone following abscess sampling which was performed 10/19. Abscess and urine cultures grew Serratia, though blood cultures oddly remain negative. 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 ?Hematogenous spread but NG on blood Cx. With IVDU, will need TEE per discussion with ID.  - Follow blood cultures - Consulted cardiology for TEE, cannot do without EGD first to r/o varicies. Now planned for 10/10/18. - Continue abx per ID recommendations. Likely eventually home on levofloxacin or cipro x 6-8weeks -Remains stable at this  time  L3-4 vertebral body collapse, retropulsion causing severe spinal stenosis: No myelopathy. Neurosurgery consulted, recommending IV antibiotics, no surgery.  - PT/OT continued - Pain control: Significant tolerance, so on oxycontin 30mg  q12h and oxyIR 10-20mg  q3h prn mod/sev pain. Discussed with patient that we anticipate deescalation as infection is treated.  -continue abx as per above  Liver cirrhosis with ascites, portal hypertensive gastropathy, history of bleeding gastric ulcer, thrombocytopenia: Normal mentation without evidence of GI bleeding at present. Pt denies nausea, abd pain.  - Avoid tylenol.  -Continue to follow CMP -No obvious blood loss. Repeat CBC in AM  Polysubstance abuse, IVDU:  - Cessation counseling provided.  - Will NOT be a candidate for home health IV antibiotics - Remains stable at present  Hepatitis B and C:  - Serologies reviewed -Cont as per ID recs  Moderate protein calorie malnutrition:  - Protein supplemented - Continue as per nutrition  Anemia of chronic disease:  - No obvious blood loss noted - Repeat CBC in aM  DVT prophylaxis: SCD's Code Status: Full Family Communication: Pt in room, family not at bedside Disposition Plan: Uncertain at this time  Consultants:   Neurosurgery, Dr. Maurice Small  Infectious Diseases, Dr. Drue Second  Interventional radiology, Dr. Loreta Ave  Procedures:   Imaging guided L3-L4 disc aspiration for sample 10/01/2018 by Dr. Loreta Ave.  Echocardiogram 10/01/2018: - Left ventricle: The cavity size was normal. Hedtke thickness was normal. Systolic function was normal. The estimated ejection fraction was in the range of 60% to 65%. Jerome motion was normal; there were no regional Hebb motion abnormalities. Left ventricular diastolic function parameters were normal. - Mitral valve: Mildly thickened leaflets . There was trace to mild regurgitation. -  Left atrium: The atrium was normal in size. - Inferior  vena cava: The vessel was normal in size. The respirophasic diameter changes were in the normal range (= 50%), consistent with normal central venous pressure.  Impressions:  - Compared to a prior study in 2017, there have been no changes. No vegetation is noted.  Antimicrobials: Anti-infectives (From admission, onward)   Start     Dose/Rate Route Frequency Ordered Stop   10/06/18 1800  tenofovir (VIREAD) tablet 300 mg     300 mg Oral Daily 10/06/18 1530     10/06/18 0000  Tenofovir Alafenamide Fumarate (VEMLIDY) 25 MG TABS     1 tablet Oral Daily 10/06/18 1500     10/01/18 2000  vancomycin (VANCOCIN) IVPB 1000 mg/200 mL premix  Status:  Discontinued     1,000 mg 200 mL/hr over 60 Minutes Intravenous Every 8 hours 10/01/18 1903 10/03/18 0939   10/01/18 1900  cefTRIAXone (ROCEPHIN) 2 g in sodium chloride 0.9 % 100 mL IVPB     2 g 200 mL/hr over 30 Minutes Intravenous Every 24 hours 10/01/18 1857     09/29/18 2315  piperacillin-tazobactam (ZOSYN) IVPB 3.375 g     3.375 g 100 mL/hr over 30 Minutes Intravenous NOW 09/29/18 2306 09/29/18 2359   09/29/18 2315  vancomycin (VANCOCIN) 1,500 mg in sodium chloride 0.9 % 500 mL IVPB     1,500 mg 250 mL/hr over 120 Minutes Intravenous  Once 09/29/18 2306 09/30/18 0310      Subjective: Pt reports looking forward to proceeding with TEE  Objective: Vitals:   10/08/18 0453 10/08/18 1228 10/08/18 2029 10/09/18 0400  BP: 98/62 106/64 108/63 113/83  Pulse: 90 82 87 80  Resp:  20    Temp: 97.9 F (36.6 C) 98.3 F (36.8 C) 98.1 F (36.7 C) 98.2 F (36.8 C)  TempSrc: Oral Oral Oral Oral  SpO2: 96% 97% 98% 96%  Weight:      Height:        Intake/Output Summary (Last 24 hours) at 10/09/2018 1549 Last data filed at 10/09/2018 1000 Gross per 24 hour  Intake -  Output 800 ml  Net -800 ml   Filed Weights   09/29/18 1828 10/01/18 1157 10/07/18 0730  Weight: 63.5 kg 72.8 kg 72.8 kg    Examination: General exam: Awake, laying  in bed, in nad Respiratory system: Normal respiratory effort, no wheezing  Data Reviewed: I have personally reviewed following labs and imaging studies  CBC: Recent Labs  Lab 10/03/18 0513 10/06/18 0247 10/07/18 0240  WBC 4.0 3.5* 4.0  HGB 9.4* 8.6* 8.2*  HCT 30.6* 28.9* 27.7*  MCV 84.3 86.5 86.3  PLT 83* 77* 81*   Basic Metabolic Panel: Recent Labs  Lab 10/03/18 0513 10/06/18 0247  NA 134* 135  K 4.8 4.9  CL 103 101  CO2 25 28  GLUCOSE 144* 113*  BUN 25* 29*  CREATININE 0.89 0.76  CALCIUM 7.9* 8.3*   GFR: Estimated Creatinine Clearance: 115 mL/min (by C-G formula based on SCr of 0.76 mg/dL). Liver Function Tests: Recent Labs  Lab 10/06/18 0247  AST 46*  ALT 35  ALKPHOS 144*  BILITOT 0.3  PROT 6.6  ALBUMIN 2.0*   No results for input(s): LIPASE, AMYLASE in the last 168 hours. No results for input(s): AMMONIA in the last 168 hours. Coagulation Profile: No results for input(s): INR, PROTIME in the last 168 hours. Cardiac Enzymes: No results for input(s): CKTOTAL, CKMB, CKMBINDEX, TROPONINI in the last 168  hours. BNP (last 3 results) No results for input(s): PROBNP in the last 8760 hours. HbA1C: No results for input(s): HGBA1C in the last 72 hours. CBG: No results for input(s): GLUCAP in the last 168 hours. Lipid Profile: No results for input(s): CHOL, HDL, LDLCALC, TRIG, CHOLHDL, LDLDIRECT in the last 72 hours. Thyroid Function Tests: No results for input(s): TSH, T4TOTAL, FREET4, T3FREE, THYROIDAB in the last 72 hours. Anemia Panel: No results for input(s): VITAMINB12, FOLATE, FERRITIN, TIBC, IRON, RETICCTPCT in the last 72 hours. Sepsis Labs: No results for input(s): PROCALCITON, LATICACIDVEN in the last 168 hours.  Recent Results (from the past 240 hour(s))  Blood Culture (routine x 2)     Status: None   Collection Time: 09/29/18 11:32 PM  Result Value Ref Range Status   Specimen Description   Final    BLOOD LEFT HAND Performed at [facility], [address] [place], [place] [address]    Special Requests   Final    BOTTLES DRAWN AEROBIC AND ANAEROBIC Blood Culture results may not be optimal due to an inadequate volume of blood received in culture bottles Performed at [facility], [address] [place], [place] [address]    Culture   Final    NO GROWTH 5 DAYS Performed at [facility], [address], [place], [place] [address]    Report Status 10/05/2018 FINAL  Final  Blood Culture (routine x 2)     Status: None   Collection Time: 09/29/18 11:32 PM  Result Value Ref Range Status   Specimen Description   Final    BLOOD RIGHT FOREARM Performed at [facility], [address] [place], [place] [address]    Special Requests   Final    BOTTLES DRAWN AEROBIC AND ANAEROBIC Blood Culture results may not be optimal due to an inadequate volume of blood received in culture bottles Performed at [facility], [address], [place], [place] [address]    Culture   Final    NO GROWTH 5 DAYS Performed at [facility], [address] [place], [place] [address]    Report Status 10/05/2018 FINAL  Final  Urine culture     Status: Abnormal   Collection Time: 09/29/18 11:32 PM  Result Value Ref Range Status   Specimen Description   Final    URINE, CLEAN CATCH Performed at [facility], [address] [place], [place] [address]    Special Requests   Final    NONE Performed at [facility], [address] [place], [place] [address]    Culture >=100,000 COLONIES/mL SERRATIA MARCESCENS (A)  Final   Report Status 10/02/2018 FINAL  Final   Organism ID, Bacteria SERRATIA MARCESCENS (A)  Final      Susceptibility   Serratia marcescens - MIC*    CEFAZOLIN >=64 RESISTANT Resistant     CEFTRIAXONE <=1 SENSITIVE Sensitive     CIPROFLOXACIN <=0.25 SENSITIVE Sensitive     GENTAMICIN <=1 SENSITIVE Sensitive     NITROFURANTOIN 128  RESISTANT Resistant     TRIMETH/SULFA <=20 SENSITIVE Sensitive     * >=100,000 COLONIES/mL SERRATIA MARCESCENS  Aerobic/Anaerobic Culture (surgical/deep wound)     Status: None   Collection Time: 10/01/18 10:26 AM  Result Value Ref Range Status   Specimen Description ABSCESS VERTEBRA L3 L4  Final   Special Requests NONE  Final   Gram Stain   Final  FEW WBC PRESENT, PREDOMINANTLY PMN NO ORGANISMS SEEN    Culture   Final    FEW SERRATIA MARCESCENS NO ANAEROBES ISOLATED Performed at [facility], [address] [place], [place] [address]    Report Status 10/06/2018 FINAL  Final   Organism ID, Bacteria SERRATIA MARCESCENS  Final      Susceptibility   Serratia marcescens - MIC*    CEFAZOLIN >=64 RESISTANT Resistant     CEFEPIME <=1 SENSITIVE Sensitive     CEFTAZIDIME <=1 SENSITIVE Sensitive     CEFTRIAXONE <=1 SENSITIVE Sensitive     CIPROFLOXACIN <=0.25 SENSITIVE Sensitive     GENTAMICIN <=1 SENSITIVE Sensitive     TRIMETH/SULFA <=20 SENSITIVE Sensitive     * FEW SERRATIA MARCESCENS     Radiology Studies: No results found.  Scheduled Meds: . feeding supplement (ENSURE ENLIVE)  237 mL Oral TID BM  . multivitamin with minerals  1 tablet Oral Daily  . oxyCODONE  30 mg Oral Q12H  . polyethylene glycol  17 g Oral Daily  . senna-docusate  1 tablet Oral BID  . tenofovir  300 mg Oral Daily  . traZODone  100 mg Oral QHS   Continuous Infusions: . cefTRIAXone (ROCEPHIN)  IV 2 g (10/08/18 2150)     LOS: 9 days   Rickey Barbara, MD [facility] Pager On Amion  If 7PM-7AM, please contact night-coverage 10/09/2018, 3:49 PM

## 2018-10-10 ENCOUNTER — Inpatient Hospital Stay (HOSPITAL_COMMUNITY): Payer: Medicaid Other

## 2018-10-10 ENCOUNTER — Encounter (HOSPITAL_COMMUNITY): Payer: Self-pay | Admitting: Certified Registered Nurse Anesthetist

## 2018-10-10 ENCOUNTER — Inpatient Hospital Stay (HOSPITAL_COMMUNITY): Payer: Medicaid Other | Admitting: Certified Registered Nurse Anesthetist

## 2018-10-10 ENCOUNTER — Encounter (HOSPITAL_COMMUNITY)
Admission: EM | Disposition: A | Discharge status: Home Health | Payer: Self-pay | Source: Home / Self Care | Attending: Internal Medicine

## 2018-10-10 DIAGNOSIS — I340 Nonrheumatic mitral (valve) insufficiency: Secondary | ICD-10-CM

## 2018-10-10 DIAGNOSIS — K228 Other specified diseases of esophagus: Secondary | ICD-10-CM

## 2018-10-10 DIAGNOSIS — K3189 Other diseases of stomach and duodenum: Secondary | ICD-10-CM

## 2018-10-10 DIAGNOSIS — R634 Abnormal weight loss: Secondary | ICD-10-CM

## 2018-10-10 DIAGNOSIS — I8510 Secondary esophageal varices without bleeding: Secondary | ICD-10-CM

## 2018-10-10 DIAGNOSIS — R7881 Bacteremia: Secondary | ICD-10-CM

## 2018-10-10 DIAGNOSIS — Z6822 Body mass index (BMI) 22.0-22.9, adult: Secondary | ICD-10-CM

## 2018-10-10 DIAGNOSIS — M4645 Discitis, unspecified, thoracolumbar region: Secondary | ICD-10-CM

## 2018-10-10 DIAGNOSIS — K7469 Other cirrhosis of liver: Secondary | ICD-10-CM

## 2018-10-10 DIAGNOSIS — B181 Chronic viral hepatitis B without delta-agent: Secondary | ICD-10-CM

## 2018-10-10 DIAGNOSIS — K766 Portal hypertension: Secondary | ICD-10-CM

## 2018-10-10 DIAGNOSIS — K314 Gastric diverticulum: Secondary | ICD-10-CM

## 2018-10-10 HISTORY — PX: BIOPSY: SHX5522

## 2018-10-10 HISTORY — PX: ESOPHAGOGASTRODUODENOSCOPY (EGD) WITH PROPOFOL: SHX5813

## 2018-10-10 HISTORY — PX: TEE WITHOUT CARDIOVERSION: SHX5443

## 2018-10-10 LAB — CBC
HCT: 35 % — ABNORMAL LOW (ref 39.0–52.0)
HEMOGLOBIN: 10.5 g/dL — AB (ref 13.0–17.0)
MCH: 25.7 pg — ABNORMAL LOW (ref 26.0–34.0)
MCHC: 30 g/dL (ref 30.0–36.0)
MCV: 85.6 fL (ref 80.0–100.0)
PLATELETS: 102 10*3/uL — AB (ref 150–400)
RBC: 4.09 MIL/uL — AB (ref 4.22–5.81)
RDW: 17.6 % — ABNORMAL HIGH (ref 11.5–15.5)
WBC: 5.7 10*3/uL (ref 4.0–10.5)
nRBC: 0 % (ref 0.0–0.2)

## 2018-10-10 SURGERY — ESOPHAGOGASTRODUODENOSCOPY (EGD) WITH PROPOFOL
Anesthesia: Monitor Anesthesia Care

## 2018-10-10 MED ORDER — LEVOFLOXACIN 750 MG PO TABS: 750.0000 mg | ORAL_TABLET | Freq: Every day | ORAL | 0 refills | Status: AC

## 2018-10-10 MED ORDER — SODIUM CHLORIDE 0.9 % IV SOLN
INTRAVENOUS | Status: DC | PRN
  Administered 2018-10-10: 20 ug/min via INTRAVENOUS

## 2018-10-10 MED ORDER — LIDOCAINE 2% (20 MG/ML) 5 ML SYRINGE
INTRAMUSCULAR | Status: DC | PRN
  Administered 2018-10-10: 60 mg via INTRAVENOUS

## 2018-10-10 MED ORDER — GLYCOPYRROLATE 0.2 MG/ML IJ SOLN
INTRAMUSCULAR | Status: DC | PRN
  Administered 2018-10-10 (×2): 0.1 mg via INTRAVENOUS

## 2018-10-10 MED ORDER — PROPOFOL 500 MG/50ML IV EMUL
INTRAVENOUS | Status: DC | PRN
  Administered 2018-10-10: 100 ug/kg/min via INTRAVENOUS
  Administered 2018-10-10: 13:00:00 via INTRAVENOUS

## 2018-10-10 MED ORDER — PROPOFOL 10 MG/ML IV BOLUS: INTRAVENOUS | Status: DC | PRN

## 2018-10-10 MED ORDER — ONDANSETRON HCL 4 MG/2ML IJ SOLN
INTRAMUSCULAR | Status: DC | PRN
  Administered 2018-10-10: 4 mg via INTRAVENOUS

## 2018-10-10 MED ORDER — LACTATED RINGERS IV SOLN
INTRAVENOUS | Status: DC | PRN
  Administered 2018-10-10 (×2): via INTRAVENOUS

## 2018-10-10 MED ORDER — PROPOFOL 10 MG/ML IV BOLUS
INTRAVENOUS | Status: DC | PRN
  Administered 2018-10-10: 50 mg via INTRAVENOUS
  Administered 2018-10-10: 40 mg via INTRAVENOUS

## 2018-10-10 MED FILL — VEMLIDY 25 MG TABLET: 25 | 30 days supply | Qty: 30 | Fill #0

## 2018-10-10 MED FILL — levoFLOXacin 750 MG TABS: 750 | 30 days supply | Qty: 30 | Fill #0

## 2018-10-10 SURGICAL SUPPLY — 15 items

## 2018-10-10 NOTE — Interval H&P Note (Signed)
History and Physical Interval Note:  10/10/2018 11:01 AM  Dan [name]  has presented today for surgery, with the diagnosis of bacteremia, cirrohsis  The various methods of treatment have been discussed with the patient and family. After consideration of risks, benefits and other options for treatment, the patient has consented to  Procedure(s): ESOPHAGOGASTRODUODENOSCOPY (EGD) WITH PROPOFOL (N/A) TRANSESOPHAGEAL ECHOCARDIOGRAM (TEE) as a surgical intervention .  The patient's history has been reviewed, patient examined, no change in status, stable for surgery.  I have reviewed the patient's chart and labs.  Questions were answered to the patient's satisfaction.     Tobias Alexander

## 2018-10-10 NOTE — Plan of Care (Signed)

## 2018-10-10 NOTE — Plan of Care (Signed)
  Problem: Education: Goal: Knowledge of General Education information will improve Description Including pain rating scale, medication(s)/side effects and non-pharmacologic comfort measures Outcome: Progressing   

## 2018-10-10 NOTE — Progress Notes (Signed)
Physical Therapy Cancellation Note   10/10/18 1337  PT Visit Information  Last PT Received On 10/10/18  Reason Eval/Treat Not Completed Patient at procedure or test/unavailable   Erline Levine, PTA Acute Rehabilitation Services Pager: [phone] Office: [phone]

## 2018-10-10 NOTE — Anesthesia Postprocedure Evaluation (Signed)
Anesthesia Post Note  Patient: Dan [name]  Procedure(s) Performed: ESOPHAGOGASTRODUODENOSCOPY (EGD) WITH PROPOFOL (N/A ) TRANSESOPHAGEAL ECHOCARDIOGRAM (TEE) BIOPSY     Patient location during evaluation: Endoscopy Anesthesia Type: MAC Level of consciousness: awake Pain management: pain level controlled Vital Signs Assessment: post-procedure vital signs reviewed and stable Respiratory status: spontaneous breathing Cardiovascular status: stable Postop Assessment: no apparent nausea or vomiting Anesthetic complications: no    Last Vitals:  Vitals:   10/10/18 1330 10/10/18 1340  BP: (!) 77/53 100/64  Pulse: 84 85  Resp: 11 14  Temp:    SpO2: 98% 97%    Last Pain:  Vitals:   10/10/18 1350  TempSrc:   PainSc: 0-No pain   Pain Goal: Patients Stated Pain Goal: 3 (10/08/18 0242)               Huston Foley

## 2018-10-10 NOTE — Consult Note (Addendum)
[facility] Consult: 8:45 AM 10/10/2018  LOS: 10 days    Referring Provider: Dr Wyline Copas  Primary Care Physician:  Imagene Riches, NP in [place].  Has not seen her in > 1 year, tends to go to ED for medical needs.   Primary Gastroenterologist:  Althia Forts.  Never followed up in office with GI.    Reason for Consultation:  Need EGD pre TEE.     HPI: Dan [name] is a 47 y.o. male.  PMH chronic pain, polysubstance/IV drug abuse (had quit 2016). 2012 lung abscess, empyema status post thoracotomy. Cirrhosis dx based on CT 2017 revealing cirrhosis, splenomegaly, portal hypertension, probable esophageal varices, large volume ascites. Also showed edema in the stomach and distal esophagus. Hep C positive never treated, viral count 53,500 in 2017 , genotype 1a. Hep B surface Ag and hep Be Ag positive. Thrombocytopenia (low of 55 K in 07/2016).    EGD 07/16/16 for hematemesis, Dr Havery Moros: Large proximal stomach ulceration, portal hypertensive gastropathy. No esophageal varices. Gastric biopsy pathology showed unremarkable gastric type mucosa. No H. pylori, dysplasia or malignancy. 09/2016 EGD to fup ulcer: ulcer totally healed.  Mild portal hypertensive gastropathy.   09/2016 Paracentesis: 1.5 liters, fluid WBCs 457)  Resumed IV heroin (inpast injected crushed Opana) for self mgt of back pain and is having fevers, chills, anorexia, decline.  Also c/o pain in lateral lower abdomen, flanks.   CT ab pelvis w contrast: osteomyelitis lumbar and low thoracic spine, with abscess right psoas muscle. Septic looking widening of right SI joint.  Cirrhosis, splenomegaly, portal htn.  Thickened distal esophagus, may reflect varices.  Small perihepatic ascites. Urine and abscess growing serratia.  Blood clxs negative.    No veg on plain  echo. TEE planned today but needs pre TEE EGD.   PT/INR 15.5/1.2 Alk phos and AST elevated but O/w LFTs normal.     Current HCV quant 36K.  Hep B core, Hep B E Ag  Both positive.  HBV quant 308,000,000 Hgb 8.2 on 10/25, 10.5 today.  MCV 85.   Platelet nadir of 47, 102 today.     Denies use of aspirin, ibuprofen or any other NSAIDs.  Not using PPI or Hs blocker.  Occasionally uses Tums for heartburn.  No dysphagia.  No nausea.  Saw minor rectal bleeding accompanying brown stools for several days in a row 2 or 3 weeks ago.   09/2016 his weight was 72.1 kg/159 #.  Currently weighs 72.8 kg. Lives with his mom in [place].  Does not drink alcohol, has no history of significant EtOH use.  Past Medical History:  Diagnosis Date  . Anxiety   . Chronic lower back pain   . Chronic pain   . Cirrhosis of liver ([place])   . Depression   . Gastric ulceration   . Hepatitis B   . Hepatitis C    "never tx'd" (09/30/2018)  . History of kidney stones   . Polysubstance abuse ([place])   . Upper GI bleed     Past Surgical History:  Procedure Laterality  Date  . CYSTOSCOPY W/ STONE MANIPULATION    . ESOPHAGOGASTRODUODENOSCOPY N/A 07/16/2016   Procedure: ESOPHAGOGASTRODUODENOSCOPY (EGD);  Surgeon: Manus Gunning, MD;  Location: [facility];  Service: Gastroenterology;  Laterality: N/A;  . ESOPHAGOGASTRODUODENOSCOPY N/A 10/08/2016   Procedure: ESOPHAGOGASTRODUODENOSCOPY (EGD);  Surgeon: Doran Stabler, MD;  Location: [facility] ENDOSCOPY;  Service: Endoscopy;  Laterality: N/A;  . IR LUMBAR [place] W/IMG GUIDE  10/01/2018  . LUNG LOBECTOMY Right   . TEE WITHOUT CARDIOVERSION N/A 10/30/2016   Procedure: TRANSESOPHAGEAL ECHOCARDIOGRAM (TEE);  Surgeon: Fay Records, MD;  Location: [facility] ENDOSCOPY;  Service: Cardiovascular;  Laterality: N/A;    Prior to Admission medications   Medication Sig Start Date End Date Taking? Authorizing Provider  cephALEXin (KEFLEX) 500 MG capsule Take 1 capsule (500 mg total)  by mouth 3 (three) times daily. Patient not taking: Reported on 09/29/2018 05/11/18   Domenic Moras, PA-C  oxyCODONE-acetaminophen (PERCOCET) 5-325 MG tablet Take 1 tablet by mouth every 6 (six) hours as needed for moderate pain or severe pain. Patient not taking: Reported on 09/29/2018 05/11/18   Domenic Moras, PA-C  Tenofovir Alafenamide Fumarate (VEMLIDY) 25 MG TABS Take 1 tablet (25 mg total) by mouth daily. 10/06/18   Donne Hazel, MD    Scheduled Meds: . feeding supplement (ENSURE ENLIVE)  237 mL Oral TID BM  . multivitamin with minerals  1 tablet Oral Daily  . oxyCODONE  30 mg Oral Q12H  . polyethylene glycol  17 g Oral Daily  . senna-docusate  1 tablet Oral BID  . tenofovir  300 mg Oral Daily  . traZODone  100 mg Oral QHS   Infusions: . cefTRIAXone (ROCEPHIN)  IV 2 g (10/09/18 2027)   PRN Meds: acetaminophen, oxyCODONE   Allergies as of 09/29/2018  . (No Known Allergies)    Family History  Problem Relation Age of Onset  . Dementia Mother     Social History   Socioeconomic History  . Marital status: Single    Spouse name: Not on file  . Number of children: Not on file  . Years of education: Not on file  . Highest education level: Not on file  Occupational History  . Occupation: disabled  Social Needs  . Financial resource strain: Not on file  . Food insecurity:    Worry: Not on file    Inability: Not on file  . Transportation needs:    Medical: Not on file    Non-medical: Not on file  Tobacco Use  . Smoking status: Current Every Day Smoker    Packs/day: 1.00    Years: 36.00    Pack years: 36.00    Types: Cigarettes  . Smokeless tobacco: Never Used  Substance and Sexual Activity  . Alcohol use: No  . Drug use: Yes    Types: Heroin    Comment: 09/30/2018 "qd"  . Sexual activity: Not Currently  Lifestyle  . Physical activity:    Days per week: Not on file    Minutes per session: Not on file  . Stress: Not on file  Relationships  . Social  connections:    Talks on phone: Not on file    Gets together: Not on file    Attends religious service: Not on file    Active member of club or organization: Not on file    Attends meetings of clubs or organizations: Not on file    Relationship status: Not on file  . Intimate partner violence:  Fear of current or ex partner: Not on file    Emotionally abused: Not on file    Physically abused: Not on file    Forced sexual activity: Not on file  Other Topics Concern  . Not on file  Social History Narrative  . Not on file    REVIEW OF SYSTEMS: Constitutional: Weakness, fatigue.  Feels rundown by chronic pain. ENT:  No nose bleeds Pulm: No new shortness of breath or cough.  No pleuritic pain. CV:  No palpitations, no LE edema.  GU:  No hematuria, no frequency GI:  Per HPI Heme: Denies excessive bleeding or bruising. Transfusions: In the past but none during current admission. Neuro:  No headaches, no peripheral tingling or numbness.   No seizures, no syncope. Derm:  No itching, no rash or sores.  Endocrine: Resolved sweats, chills.  No polyuria or dysuria Immunization: Did not inquire as to recent vaccinations but given his lack of follow-up with primary care physicians, suspect multiple vaccinations are not up-to-date Travel:  None beyond local counties in last few months.    PHYSICAL EXAM: Vital signs in last 24 hours: Vitals:   10/09/18 2034 10/10/18 0426  BP: 112/70 101/71  Pulse: 86 88  Resp:    Temp:  98.2 F (36.8 C)  SpO2: 95% 98%   Wt Readings from Last 3 Encounters:  10/07/18 72.8 kg  05/10/18 63.5 kg  10/31/16 68.9 kg    General: Thin, cachectic, alert, comfortable, pleasant WM.  Looks in poor health.  Looks older than stated age. Head: No facial asymmetry or swelling.  No signs of head trauma.  General wasting of the facial structures, hollowed out appearance Eyes: No scleral icterus or conjunctival pallor.  EOMI. Ears: Not hard of hearing Nose: No  congestion or discharge Mouth: Very few teeth remain, most of them are incisors on the upper and lower jaw.  Mucosa is pink, moist, clear.  Tongue midline. Neck: No JVD, no masses, no thyromegaly. Lungs: Diminished but clear bilaterally.  No cough or labored breathing. Heart: RRR.  Do not appreciate murmur, rub, gallop.  S1, S2 present Abdomen: Soft, firm.  Do not appreciate spleen or liver edge.  No bruits, no hernias.  Bowel sounds active.  Not distended..   Rectal: Deferred Musc/Skeltl: No joint redness, swelling.  No gross deformities Extremities: Thin limbs.  No swelling Neurologic: Alert.  Oriented x3.  No tremors, no asterixis. Skin: No rash, sores Tattoos: Yes Nodes: No cervical adenopathy Psych: Cooperative, calm, pleasant.  Affect bland.  Intake/Output from previous day: 10/27 0701 - 10/28 0700 In: -  Out: 1000 [Urine:1000] Intake/Output this shift: No intake/output data recorded.  LAB RESULTS: Recent Labs    10/10/18 0436  WBC 5.7  HGB 10.5*  HCT 35.0*  PLT 102*   BMET Lab Results  Component Value Date   NA 135 10/06/2018   NA 134 (L) 10/03/2018   NA 136 10/01/2018   K 4.9 10/06/2018   K 4.8 10/03/2018   K 4.3 10/01/2018   CL 101 10/06/2018   CL 103 10/03/2018   CL 107 10/01/2018   CO2 28 10/06/2018   CO2 25 10/03/2018   CO2 22 10/01/2018   GLUCOSE 113 (H) 10/06/2018   GLUCOSE 144 (H) 10/03/2018   GLUCOSE 114 (H) 10/01/2018   BUN 29 (H) 10/06/2018   BUN 25 (H) 10/03/2018   BUN 12 10/01/2018   CREATININE 0.76 10/06/2018   CREATININE 0.89 10/03/2018   CREATININE 0.80 10/01/2018  CALCIUM 8.3 (L) 10/06/2018   CALCIUM 7.9 (L) 10/03/2018   CALCIUM 7.7 (L) 10/01/2018   LFT No results for input(s): PROT, ALBUMIN, AST, ALT, ALKPHOS, BILITOT, BILIDIR, IBILI in the last 72 hours. PT/INR Lab Results  Component Value Date   INR 1.24 09/30/2018   INR 1.26 10/30/2016   INR 1.29 10/27/2016   Hepatitis Panel No results for input(s): HEPBSAG, HCVAB,  HEPAIGM, HEPBIGM in the last 72 hours. C-Diff No components found for: CDIFF Lipase     Component Value Date/Time   LIPASE 42 09/29/2018 2235    Drugs of Abuse     Component Value Date/Time   LABOPIA POSITIVE (A) 10/28/2016 1208   COCAINSCRNUR NONE DETECTED 10/28/2016 1208   LABBENZ NONE DETECTED 10/28/2016 1208   AMPHETMU NONE DETECTED 10/28/2016 1208   THCU NONE DETECTED 10/28/2016 1208   LABBARB NONE DETECTED 10/28/2016 1208     RADIOLOGY STUDIES: No results found.    IMPRESSION:   *   Cirrhosis of liver in pt with active Hep B and C.  Portal htn gatropathy and gastric ulcer on EGDs in 2017.  Current CT suggest esophageal varices, confirms ongong portal htn.   coags normal.    *   Chronic back pain.  IV drug abuse, heroin.  Now with spinal osteomyelitis, psoas abcsess, growing Serratia.    *   Anemia.  Normocytic  *   Thrombocytopenia.    *    Minor BPR 2 to 3 weeks ago, resolved.  No previous colonoscopy.    PLAN:     *    EGD today, pre TEE.  If he requires variceal banding this will require cancellation of TEE.  *   Eventually needs to establish with ID clinic for tx of his Hepatitis B/C.  *    Not currently receiving any stomach acid controlling medications.  Given history of gastric ulcer should probably be on PPI, or at least H2 blocker.  Will assess for peptic acid disease today and determine whether or not he needs these medications.   Azucena Freed  10/10/2018, 8:45 AM Phone [phone]

## 2018-10-10 NOTE — Progress Notes (Signed)
PROGRESS NOTE    Dan [name]  ZOX:[id number] DOB: 03/24/1971 DOA: 09/29/2018 PCP: [name], NP    Brief Narrative:  47 y.o. 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 He was afebrile with normal WBC but imaging demonstrated thoracolumbar discitis/osteomyelitis at multiple levels with marked collapse/retropulsion of vertebral body at L3-4 with severe spinal stenosis and abscess extending to bilateral psoas muscles. Neurosurgery was consulted, though the patient has no myelopathic symptoms or signs and medical management was recommended. ID was consulted, recommended vancomycin and ceftriaxone following abscess sampling which was performed 10/19. Abscess and urine cultures grew Serratia, though blood cultures oddly remain negative. 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 ?Hematogenous spread but NG on blood Cx. With IVDU, underwent TEE to r/o endocarditis - Follow blood cultures -Per ID, recommendation to switch to levofloxacin 750mg  qday to complete 6 weeks of tx on 11/27. -Remains stable at this time -Pt to follow up with ID in 2-3 weeks  L3-4 vertebral body collapse, retropulsion causing severe spinal  stenosis: No myelopathy. Neurosurgery consulted, recommending IV antibiotics, no surgery.  - PT/OT continued - Pain control: Significant tolerance, so on oxycontin 30mg  q12h and oxyIR 10-20mg  q3h prn mod/sev pain. Discussed with patient that we anticipate deescalation as infection is treated.  -continue abx as per above  Liver cirrhosis with ascites, portal hypertensive gastropathy, history of bleeding gastric ulcer, thrombocytopenia: Normal mentation without evidence of GI bleeding at present. Pt denies nausea, abd pain.  - Avoid tylenol.  -Continue to follow CMP -No obvious blood loss. Repeat CBC in AM  Polysubstance abuse, IVDU:  - Cessation counseling provided.  - Will NOT be a candidate for home health IV antibiotics - Remains stable at present  Hepatitis B and C:  - Serologies reviewed -Cont as per ID recs  Moderate protein calorie malnutrition:  - Protein supplemented - Continue as per nutrition  Anemia of chronic disease:  - No obvious blood loss noted - Repeat CBC in aM  DVT prophylaxis: SCD's Code Status: Full Family Communication: Pt in room, family not at bedside Disposition Plan: Uncertain at this time  Consultants:   Neurosurgery, Dr. Maurice Small  Infectious Diseases, Dr. Drue Second  Interventional radiology, Dr. Loreta Ave  Procedures:   Imaging guided L3-L4 disc aspiration for sample 10/01/2018 by Dr. Loreta Ave.  Echocardiogram 10/01/2018: - Left ventricle: The cavity size was normal. Yiu thickness was normal. Systolic function was normal. The estimated ejection fraction was in the range of 60% to 65%. Enriquez motion was normal; there were no regional Dolney motion abnormalities. Left ventricular diastolic function parameters were normal. - Mitral valve: Mildly thickened leaflets . There was trace to mild regurgitation. - Left atrium: The atrium was normal in size. -  Inferior vena cava: The vessel was normal in size. The respirophasic diameter  changes were in the normal range (= 50%), consistent with normal central venous pressure.  Impressions:  - Compared to a prior study in 2017, there have been no changes. No vegetation is noted.  Antimicrobials: Anti-infectives (From admission, onward)   Start     Dose/Rate Route Frequency Ordered Stop   10/10/18 0000  levofloxacin (LEVAQUIN) 750 MG tablet     750 mg Oral Daily 10/10/18 1514 11/09/18 2359   10/06/18 1800  tenofovir (VIREAD) tablet 300 mg     300 mg Oral Daily 10/06/18 1530     10/06/18 0000  Tenofovir Alafenamide Fumarate (VEMLIDY) 25 MG TABS     1 tablet Oral Daily 10/06/18 1500     10/01/18 2000  vancomycin (VANCOCIN) IVPB 1000 mg/200 mL premix  Status:  Discontinued     1,000 mg 200 mL/hr over 60 Minutes Intravenous Every 8 hours 10/01/18 1903 10/03/18 0939   10/01/18 1900  cefTRIAXone (ROCEPHIN) 2 g in sodium chloride 0.9 % 100 mL IVPB     2 g 200 mL/hr over 30 Minutes Intravenous Every 24 hours 10/01/18 1857     09/29/18 2315  piperacillin-tazobactam (ZOSYN) IVPB 3.375 g     3.375 g 100 mL/hr over 30 Minutes Intravenous NOW 09/29/18 2306 09/29/18 2359   09/29/18 2315  vancomycin (VANCOCIN) 1,500 mg in sodium chloride 0.9 % 500 mL IVPB     1,500 mg 250 mL/hr over 120 Minutes Intravenous  Once 09/29/18 2306 09/30/18 0310      Subjective: Feeling tired after TEE this AM  Objective: Vitals:   10/10/18 1126 10/10/18 1324 10/10/18 1330 10/10/18 1340  BP: 112/79 (!) 97/55 (!) 77/53 100/64  Pulse: 86 (!) 59 84 85  Resp: 15 13 11 14   Temp: 98.4 F (36.9 C) 98.9 F (37.2 C)    TempSrc: Oral Oral    SpO2: 96% 93% 98% 97%  Weight:      Height:        Intake/Output Summary (Last 24 hours) at 10/10/2018 1534 Last data filed at 10/10/2018 1312 Gross per 24 hour  Intake 400 ml  Output 600 ml  Net -200 ml   Filed Weights   09/29/18 1828 10/01/18 1157 10/07/18 0730  Weight: 63.5 kg 72.8 kg 72.8 kg    Examination: General exam: Conversant, in no  acute distress Respiratory system: normal chest rise, clear, no audible wheezing Cardiovascular system: regular rhythm, s1-s2  Data Reviewed: I have personally reviewed following labs and imaging studies  CBC: Recent Labs  Lab 10/06/18 0247 10/07/18 0240 10/10/18 0436  WBC 3.5* 4.0 5.7  HGB 8.6* 8.2* 10.5*  HCT 28.9* 27.7* 35.0*  MCV 86.5 86.3 85.6  PLT 77* 81* 102*   Basic Metabolic Panel: Recent Labs  Lab 10/06/18 0247  NA 135  K 4.9  CL 101  CO2 28  GLUCOSE 113*  BUN 29*  CREATININE 0.76  CALCIUM 8.3*   GFR: Estimated Creatinine Clearance: 115 mL/min (by C-G formula based on SCr of 0.76 mg/dL). Liver Function Tests: Recent Labs  Lab 10/06/18 0247  AST 46*  ALT 35  ALKPHOS 144*  BILITOT 0.3  PROT 6.6  ALBUMIN 2.0*   No results for input(s): LIPASE, AMYLASE in the last 168 hours. No results for input(s): AMMONIA in the last 168 hours. Coagulation Profile: No results for input(s): INR, PROTIME in the last 168 hours. Cardiac Enzymes: No results for input(s): CKTOTAL, CKMB, CKMBINDEX,  TROPONINI in the last 168 hours. BNP (last 3 results) No results for input(s): PROBNP in the last 8760 hours. HbA1C: No results for input(s): HGBA1C in the last 72 hours. CBG: No results for input(s): GLUCAP in the last 168 hours. Lipid Profile: No results for input(s): CHOL, HDL, LDLCALC, TRIG, CHOLHDL, LDLDIRECT in the last 72 hours. Thyroid Function Tests: No results for input(s): TSH, T4TOTAL, FREET4, T3FREE, THYROIDAB in the last 72 hours. Anemia Panel: No results for input(s): VITAMINB12, FOLATE, FERRITIN, TIBC, IRON, RETICCTPCT in the last 72 hours. Sepsis Labs: No results for input(s): PROCALCITON, LATICACIDVEN in the last 168 hours.  Recent Results (from the past 240 hour(s))  Aerobic/Anaerobic Culture (surgical/deep wound)     Status: None   Collection Time: 10/01/18 10:26 AM  Result Value Ref Range Status   Specimen Description ABSCESS VERTEBRA L3 L4  Final     Special Requests NONE  Final   Gram Stain   Final    FEW WBC PRESENT, PREDOMINANTLY PMN NO ORGANISMS SEEN    Culture   Final    FEW SERRATIA MARCESCENS NO ANAEROBES ISOLATED Performed at [facility], [address], [place], [place] [address]    Report Status 10/06/2018 FINAL  Final   Organism ID, Bacteria SERRATIA MARCESCENS  Final      Susceptibility   Serratia marcescens - MIC*    CEFAZOLIN >=64 RESISTANT Resistant     CEFEPIME <=1 SENSITIVE Sensitive     CEFTAZIDIME <=1 SENSITIVE Sensitive     CEFTRIAXONE <=1 SENSITIVE Sensitive     CIPROFLOXACIN <=0.25 SENSITIVE Sensitive     GENTAMICIN <=1 SENSITIVE Sensitive     TRIMETH/SULFA <=20 SENSITIVE Sensitive     * FEW SERRATIA MARCESCENS     Radiology Studies: No results found.  Scheduled Meds: . feeding supplement (ENSURE ENLIVE)  237 mL Oral TID BM  . multivitamin with minerals  1 tablet Oral Daily  . oxyCODONE  30 mg Oral Q12H  . polyethylene glycol  17 g Oral Daily  . senna-docusate  1 tablet Oral BID  . tenofovir  300 mg Oral Daily  . traZODone  100 mg Oral QHS   Continuous Infusions: . cefTRIAXone (ROCEPHIN)  IV 2 g (10/09/18 2027)     LOS: 10 days   Rickey Barbara, MD [facility] Pager On Amion  If 7PM-7AM, please contact night-coverage 10/10/2018, 3:34 PM

## 2018-10-10 NOTE — Op Note (Signed)
[facility] Patient Name: Dan [name] Procedure Date : 10/10/2018 MRN: [id number] Attending MD: Corliss Parish , MD Date of Birth: April 03, 1971 CSN: [id number] Age: 47 Admit Type: Inpatient Procedure:                Upper GI endoscopy Indications:              Screening procedure, Cirrhosis rule out esophageal                            varices, Abnormal CT of the GI tract Providers:                Corliss Parish, MD, Zoe Lan, RN, Kandice Robinsons, Technician Referring MD:              Medicines:                Monitored Anesthesia Care Complications:            No immediate complications. Estimated Blood Loss:     Estimated blood loss was minimal. Procedure:                Pre-Anesthesia Assessment:                           - Prior to the procedure, a History and Physical                            was performed, and patient medications and                            allergies were reviewed. The patient's tolerance of                            previous anesthesia was also reviewed. The risks                            and benefits of the procedure and the sedation                            options and risks were discussed with the patient.                            All questions were answered, and informed consent                            was obtained. Prior Anticoagulants: The patient has                            taken no previous anticoagulant or antiplatelet                            agents. ASA Grade Assessment: III - A patient with  severe systemic disease. After reviewing the risks                            and benefits, the patient was deemed in                            satisfactory condition to undergo the procedure.                           After obtaining informed consent, the endoscope was                            passed under direct vision. Throughout the   procedure, the patient's blood pressure, pulse, and                            oxygen saturations were monitored continuously. The                            [id number]-H190 ([id number]) Olympus Adult EGD was introduced                            through the mouth, and advanced to the third part                            of duodenum. The upper GI endoscopy was                            accomplished without difficulty. The patient                            tolerated the procedure. Scope In: Scope Out: Findings:      No gross lesions were noted in the proximal esophagus and in the mid       esophagus.      Grade I, small (< 5 mm) varices were found in the distal esophagus.      Localized mild mucosal changes characterized by nodularity were found at       the gastroesophageal junction. Biopsies were taken with a cold forceps       for histology.      Mild portal hypertensive gastropathy was found in the entire examined       stomach.      Multiple dispersed, small non-bleeding erosions were found on the       greater curvature of the stomach, in the gastric antrum and in the       prepyloric region of the stomach. There were no stigmata of recent       bleeding.      No other gross lesions were noted in the entire examined stomach.       Biopsies were taken with a cold forceps for histology and Helicobacter       pylori testing from antrum/incisura/greater curve/lesser curve.      Localized moderate mucosal changes characterized by nodularity were       found in the duodenal bulb. Biopsies were taken with a cold forceps for       histology.  No gross lesions were noted in the second portion of the duodenum. Impression:               - No gross lesions in esophagus.                           - Grade I and small (< 5 mm) esophageal varices.                           - Nodular mucosa in the esophagus. Biopsied to rule                            out Barrett's.                           -  Portal hypertensive gastropathy.                           - Non-bleeding erosive gastropathy.                           - No other gross lesions in the stomach. Biopsied                            for HP.                           - Mucosal changes in the duodenum. 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                           - No gross lesions in the second portion of the                            duodenum. Recommendation:           - From my perspective, if I were considering                            performing an Endoscopic Ultrasound of a lesion in                            the abdominal cavity, I would not feel                            uncomfortable to proceed. I will discuss this with                            the Cardiologist as they consider possible TEE to                            follow.                           -  Would initiate beta-blockade for patient in the                            coming days as able to decrease risk of bleeding                            from these varices in the future (query carvedilol                            v non-selective beta-blockade).                           - Will relay this information to inpatient GI                            service who will be following along.                           - Await pathology results.                           - Minimize NSAID use.                           - PPI 40 mg QD to continue at least.                           - Follow up with a Gastroenterologist may be                            reasonable as he has not been seen in the                            outpatient setting (only Inpatient setting).                           - The findings and recommendations were discussed                            with the referring physician. Procedure Code(s):        --- Professional ---                           [phone], Esophagogastroduodenoscopy, flexible,                             transoral; with biopsy, single or multiple Diagnosis Code(s):        --- Professional ---                           K74.60, Unspecified cirrhosis of liver                           I85.10, Secondary esophageal varices without  bleeding                           K22.8, Other specified diseases of esophagus                           K76.6, Portal hypertension                           K31.89, Other diseases of stomach and duodenum                           Z13.810, Encounter for screening for upper                            gastrointestinal disorder                           R93.3, Abnormal findings on diagnostic imaging of                            other parts of digestive tract CPT copyright 2018 [employer]. All rights reserved. The codes documented in this report are preliminary and upon coder review may  be revised to meet current compliance requirements. Corliss Parish, MD 10/10/2018 1:10:11 PM Number of Addenda: 0

## 2018-10-10 NOTE — Anesthesia Procedure Notes (Signed)
Procedure Name: MAC Date/Time: 10/10/2018 12:24 PM Performed by: Lowella Dell, CRNA Pre-anesthesia Checklist: Patient identified, Emergency Drugs available, Suction available, Patient being monitored and Timeout performed Patient Re-evaluated:Patient Re-evaluated prior to induction Oxygen Delivery Method: Nasal cannula Induction Type: IV induction Placement Confirmation: positive ETCO2 Dental Injury: Teeth and Oropharynx as per pre-operative assessment

## 2018-10-10 NOTE — Anesthesia Preprocedure Evaluation (Signed)
Anesthesia Evaluation  Patient identified by MRN, date of birth, ID band Patient awake    Reviewed: Allergy & Precautions, NPO status , Patient's Chart, lab work & pertinent test results  History of Anesthesia Complications Negative for: history of anesthetic complications  Airway Mallampati: II  TM Distance: <3 FB Neck ROM: Full    Dental  (+) Poor Dentition, Chipped, Missing,    Pulmonary Current Smoker,    Pulmonary exam normal breath sounds clear to auscultation       Cardiovascular negative cardio ROS Normal cardiovascular exam Rhythm:Regular Rate:Normal     Neuro/Psych PSYCHIATRIC DISORDERS Anxiety Depression  Neuromuscular disease (chronic back pain. Reports blurry L eye vision prior to hospital admission. Denies hx of stroke)    GI/Hepatic PUD, (+) Cirrhosis       , Hepatitis -, B, C  Endo/Other  negative endocrine ROS  Renal/GU negative Renal ROS     Musculoskeletal  (+) Arthritis , Osteoarthritis,    Abdominal Normal abdominal exam  (+)   Peds  Hematology negative hematology ROS (+)   Anesthesia Other Findings   Reproductive/Obstetrics                             Anesthesia Physical  Anesthesia Plan  ASA: III  Anesthesia Plan: MAC   Post-op Pain Management:    Induction: Intravenous  PONV Risk Score and Plan: 2 and Ondansetron and Propofol infusion  Airway Management Planned: Nasal Cannula and Natural Airway  Additional Equipment: None  Intra-op Plan:   Post-operative Plan: Extubation in OR  Informed Consent: I have reviewed the patients History and Physical, chart, labs and discussed the procedure including the risks, benefits and alternatives for the proposed anesthesia with the patient or authorized representative who has indicated his/her understanding and acceptance.   Dental advisory given  Plan Discussed with: CRNA  Anesthesia Plan Comments:          Anesthesia Quick Evaluation

## 2018-10-10 NOTE — Transfer of Care (Signed)
Immediate Anesthesia Transfer of Care Note  Patient: Dan [name]  Procedure(s) Performed: ESOPHAGOGASTRODUODENOSCOPY (EGD) WITH PROPOFOL (N/A ) TRANSESOPHAGEAL ECHOCARDIOGRAM (TEE) BIOPSY  Patient Location: PACU and Endoscopy Unit  Anesthesia Type:MAC  Level of Consciousness: awake and patient cooperative  Airway & Oxygen Therapy: Patient Spontanous Breathing and Patient connected to nasal cannula oxygen  Post-op Assessment: Report given to RN and Post -op Vital signs reviewed and stable  Post vital signs: Reviewed and stable  Last Vitals:  Vitals Value Taken Time  BP 77/53 10/10/2018  1:28 PM  Temp 37.2 C 10/10/2018  1:24 PM  Pulse 84 10/10/2018  1:29 PM  Resp 11 10/10/2018  1:29 PM  SpO2 98 % 10/10/2018  1:29 PM  Vitals shown include unvalidated device data.  Last Pain:  Vitals:   10/10/18 1324  TempSrc: Oral  PainSc: 0-No pain      Patients Stated Pain Goal: 3 (93/79/02 4097)  Complications: No apparent anesthesia complications

## 2018-10-10 NOTE — H&P (Signed)
GASTROENTEROLOGY PROCEDURE H&P NOTE   Primary Care Physician: [name], NP  HPI: Dan [name] is a 47 y.o. male who presents for EGD for evaluation of possible EVs prior to possible TEE for endocarditis rule out.  Past Medical History:  Diagnosis Date  . Anxiety   . Chronic lower back pain   . Chronic pain   . Cirrhosis of liver (HCC)   . Depression   . Gastric ulceration   . Hepatitis B   . Hepatitis C    "never tx'd" (09/30/2018)  . History of kidney stones   . Polysubstance abuse (HCC)   . Upper GI bleed    Past Surgical History:  Procedure Laterality Date  . CYSTOSCOPY W/ STONE MANIPULATION    . ESOPHAGOGASTRODUODENOSCOPY N/A 07/16/2016   Procedure: ESOPHAGOGASTRODUODENOSCOPY (EGD);  Surgeon: Ruffin Frederick, MD;  Location[facility] ENDOSCOPY;  Service: Gastroenterology;  Laterality: N/A;  . ESOPHAGOGASTRODUODENOSCOPY N/A 10/08/2016   Procedure: ESOPHAGOGASTRODUODENOSCOPY (EGD);  Surgeon: Sherrilyn Rist, MD;  Location: [facility] ENDOSCOPY;  Service: Endoscopy;  Laterality: N/A;  . IR LUMBAR DISC ASPIRATION W/IMG GUIDE  10/01/2018  . LUNG LOBECTOMY Right   . TEE WITHOUT CARDIOVERSION N/A 10/30/2016   Procedure: TRANSESOPHAGEAL ECHOCARDIOGRAM (TEE);  Surgeon: Pricilla Riffle, MD;  Location: [facility] ENDOSCOPY;  Service: Cardiovascular;  Laterality: N/A;   Current Facility-Administered Medications  Medication Dose Route Frequency Provider Last Rate Last Dose  . [MAR Hold] acetaminophen (TYLENOL) tablet 650 mg  650 mg Oral Q6H PRN Tyrone Nine, MD   650 mg at 10/05/18 2353  . [MAR Hold] cefTRIAXone (ROCEPHIN) 2 g in sodium chloride 0.9 % 100 mL IVPB  2 g Intravenous Q24H Hazeline Junker B, MD 200 mL/hr at 10/09/18 2027 2 g at 10/09/18 2027  . [MAR Hold] feeding supplement (ENSURE ENLIVE) (ENSURE ENLIVE) liquid 237 mL  237 mL Oral TID BM Tyrone Nine, MD   237 mL at 10/09/18 2024  . [MAR Hold] multivitamin with minerals tablet 1 tablet  1 tablet Oral Daily Tyrone Nine, MD   1 tablet  at 10/10/18 1042  . [MAR Hold] oxyCODONE (Oxy IR/ROXICODONE) immediate release tablet 10-20 mg  10-20 mg Oral Q4H PRN Jerald Kief, MD   20 mg at 10/10/18 0751  . [MAR Hold] oxyCODONE (OXYCONTIN) 12 hr tablet 30 mg  30 mg Oral Q12H Tyrone Nine, MD   30 mg at 10/10/18 1042  . [MAR Hold] polyethylene glycol (MIRALAX / GLYCOLAX) packet 17 g  17 g Oral Daily Tyrone Nine, MD   17 g at 10/07/18 1000  . [MAR Hold] senna-docusate (Senokot-S) tablet 1 tablet  1 tablet Oral BID Tyrone Nine, MD   1 tablet at 10/10/18 1042  . [MAR Hold] tenofovir (VIREAD) tablet 300 mg  300 mg Oral Daily Veryl Speak, FNP   300 mg at 10/09/18 1849  . [MAR Hold] traZODone (DESYREL) tablet 100 mg  100 mg Oral QHS Tyrone Nine, MD   100 mg at 10/09/18 2024   No Known Allergies Family History  Problem Relation Age of Onset  . Dementia Mother    Social History   Socioeconomic History  . Marital status: Single    Spouse name: Not on file  . Number of children: Not on file  . Years of education: Not on file  . Highest education level: Not on file  Occupational History  . Occupation: disabled  Social Needs  . Financial resource strain: Not  on file  . Food insecurity:    Worry: Not on file    Inability: Not on file  . Transportation needs:    Medical: Not on file    Non-medical: Not on file  Tobacco Use  . Smoking status: Current Every Day Smoker    Packs/day: 1.00    Years: 36.00    Pack years: 36.00    Types: Cigarettes  . Smokeless tobacco: Never Used  Substance and Sexual Activity  . Alcohol use: No  . Drug use: Yes    Types: Heroin    Comment: 09/30/2018 "qd"  . Sexual activity: Not Currently  Lifestyle  . Physical activity:    Days per week: Not on file    Minutes per session: Not on file  . Stress: Not on file  Relationships  . Social connections:    Talks on phone: Not on file    Gets together: Not on file    Attends religious service: Not on file    Active member of club or  organization: Not on file    Attends meetings of clubs or organizations: Not on file    Relationship status: Not on file  . Intimate partner violence:    Fear of current or ex partner: Not on file    Emotionally abused: Not on file    Physically abused: Not on file    Forced sexual activity: Not on file  Other Topics Concern  . Not on file  Social History Narrative  . Not on file    Physical Exam: Vital signs in last 24 hours: Temp:  [97.2 F (36.2 C)-98.4 F (36.9 C)] 98.4 F (36.9 C) (10/28 1126) Pulse Rate:  [84-89] 86 (10/28 1126) Resp:  [15-18] 15 (10/28 1126) BP: (99-112)/(69-79) 112/79 (10/28 1126) SpO2:  [95 %-98 %] 96 % (10/28 1126) Last BM Date: 10/07/18 GEN: NAD EYE: Sclerae anicteric ENT: MMM CV: RR without R/Gs  RESP: CTAB posteriorly GI: Soft, NT/ND NEURO:  Alert & Oriented x 3  Lab Results: Recent Labs    10/10/18 0436  WBC 5.7  HGB 10.5*  HCT 35.0*  PLT 102*   BMET No results for input(s): NA, K, CL, CO2, GLUCOSE, BUN, CREATININE, CALCIUM in the last 72 hours. LFT No results for input(s): PROT, ALBUMIN, AST, ALT, ALKPHOS, BILITOT, BILIDIR, IBILI in the last 72 hours. PT/INR No results for input(s): LABPROT, INR in the last 72 hours.   Impression / Plan: This is a 47 y.o.male who presents for EGD for variceal screening  The risks and benefits of endoscopic evaluation were discussed with the patient; these include but are not limited to the risk of perforation, infection, bleeding, missed lesions, lack of diagnosis, severe illness requiring hospitalization, as well as anesthesia and sedation related illnesses.  The patient is agreeable to proceed.    Corliss Parish, MD [facility]oscopy Office # [phone]

## 2018-10-10 NOTE — Progress Notes (Signed)
Patient approved for [facility] through [facility]. This medication will be available through the [facility] transitions of care  pharmacy tomorrow 10/29 after 11 AM. They will also fill his levofloxacin.   Sharin Mons, PharmD, BCPS Infectious Diseases Clinical Pharmacist Phone: [phone]

## 2018-10-10 NOTE — Progress Notes (Signed)
RN went into pt's room to give him is PRN pain medication. He then asked if RN could grab him an extra gown before asking if he could go see a friend on another unit. RN told pt that someone would have to walk with him and stay with him. Pt asked why someone couldn't drop him off and come back to get him later. During this time, RN noticed that pt had left hand closed the whole time that he was trying to put second gown on. RN even encouraged pt to help her put the gown on to manipulate the hand to open but pt would not open his hand and rather asked to go see his friend again. Charge RN notified and on-coming RN notified that pt could have had pills still in his hand while trying to leave the unit.   This RN told pt that she nor the tech could take him to another unit. At this time, pt's hand was finally opened as he was laying on the bed.

## 2018-10-10 NOTE — Progress Notes (Signed)
PT Cancellation Note  Patient Details Name: Dan [name] MRN: [id number] DOB: 1971/12/13   Cancelled Treatment:    Reason Eval/Treat Not Completed: Patient declined, no reason specified Attempted to see pt for mobility progression. Pt pleasantly declined and reported he is tired from procedure (TEE) earlier and would like to finish lunch. PT will continue to follow acutely.    Derek Mound, PTA Acute Rehabilitation Services Pager: [phone] Office: [phone]   10/10/2018, 3:20 PM

## 2018-10-10 NOTE — CV Procedure (Signed)
     Transesophageal Echocardiogram Note  Dan [name] [id number] 06/19/1971  Procedure: Transesophageal Echocardiogram Indications: Serratia bacteremia  Procedure Details Consent: Obtained Time Out: Verified patient identification, verified procedure, site/side was marked, verified correct patient position, special equipment/implants available, Radiology Safety Procedures followed,  medications/allergies/relevent history reviewed, required imaging and test results available.  Performed  Medications: IV propofol administered by anesthesia staff for sedation.   No evidence for an endocarditis. For full report see TEE read.  Complications: No apparent complications Patient did tolerate procedure well.  Tobias [place], MD, [facility] 10/10/2018, 1:16 PM

## 2018-10-10 NOTE — Progress Notes (Addendum)
Patient ID: Dan [name], male   DOB: [birth year], 47 y.o.   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  His aspirate grew Serratia.  He has also been started on treatment for chronic hepatitis B.  He is scheduled for EGD and TEE today.  I think it would be safe and reasonable to switch him to oral levofloxacin after the procedures plan to discharge home soon to complete 6 weeks of total antibiotic therapy.  PLAN: 1. Plan on switching to oral levofloxacin 750 mg daily to complete 6 weeks of therapy on 11/09/2018 2. We are working on prior authorization for tenofovir alafenamide (Vemlidy) for chronic hepatitis B 3. We will arrange follow-up in our clinic in 2 to 3 weeks 4. I will sign off now  Principal Problem:   Discitis of thoracolumbar region Active Problems:   Chronic hepatitis B (HCC)   Other cirrhosis of liver (HCC)   Chronic hepatitis C without hepatic coma (HCC)   IV drug abuse (HCC)   Malnutrition of moderate degree   Alcoholic cirrhosis of liver with ascites (HCC)   Psoas abscess (HCC)   Scheduled Meds: . feeding supplement (ENSURE ENLIVE)  237 mL Oral TID BM  . multivitamin with minerals  1 tablet Oral Daily  . oxyCODONE  30 mg Oral Q12H  . polyethylene glycol  17 g Oral Daily  . senna-docusate  1 tablet Oral BID  . tenofovir  300 mg Oral Daily  . traZODone  100 mg Oral QHS   Continuous Infusions: . cefTRIAXone (ROCEPHIN)  IV 2 g (10/09/18 2027)   PRN Meds:.acetaminophen, oxyCODONE   SUBJECTIVE: He is still having lower midline back pain but feels like it is under better control.  Review of Systems: Review of Systems  Constitutional: Positive for weight loss. Negative for chills, diaphoresis and fever.  Musculoskeletal: Positive for back pain.    No Known  Allergies  OBJECTIVE: Vitals:   10/09/18 1613 10/09/18 2034 10/10/18 0426 10/10/18 0931  BP: 103/75 112/70 101/71 99/69  Pulse: 89 86 88 84  Resp: 18     Temp: 98 F (36.7 C)  98.2 F (36.8 C) (!) 97.2 F (36.2 C)  TempSrc: Oral  Oral Rectal  SpO2: 97% 95% 98% 96%  Weight:      Height:       Body mass index is 22.38 kg/m.  Physical Exam  Constitutional: He is oriented to person, place, and time.  He is resting quietly in bed.  He is thin but in no distress.  Cardiovascular: Normal rate, regular rhythm and normal heart sounds.  Pulmonary/Chest: Effort normal and breath sounds normal.  Abdominal: Soft. He exhibits no mass.  Neurological: He is alert and oriented to person, place, and time.  Skin: No rash noted.  Psychiatric: He has a normal mood and affect.    Lab Results Lab Results  Component Value Date   WBC 5.7 10/10/2018   HGB 10.5 (L) 10/10/2018   HCT 35.0 (L) 10/10/2018   MCV 85.6 10/10/2018   PLT 102 (L) 10/10/2018    Lab Results  Component Value Date   CREATININE 0.76 10/06/2018   BUN 29 (H) 10/06/2018   NA 135 10/06/2018   K 4.9  10/06/2018   CL 101 10/06/2018   CO2 28 10/06/2018    Lab Results  Component Value Date   ALT 35 10/06/2018   AST 46 (H) 10/06/2018   ALKPHOS 144 (H) 10/06/2018   BILITOT 0.3 10/06/2018     Microbiology: Recent Results (from the past 240 hour(s))  Aerobic/Anaerobic Culture (surgical/deep wound)     Status: None   Collection Time: 10/01/18 10:26 AM  Result Value Ref Range Status   Specimen Description ABSCESS VERTEBRA L3 L4  Final   Special Requests NONE  Final   Gram Stain   Final    FEW WBC PRESENT, PREDOMINANTLY PMN NO ORGANISMS SEEN    Culture   Final    FEW SERRATIA MARCESCENS NO ANAEROBES ISOLATED Performed at [facility], [address], [place], [place] [address]    Report Status 10/06/2018 FINAL  Final   Organism ID, Bacteria SERRATIA MARCESCENS  Final      Susceptibility   Serratia marcescens  - MIC*    CEFAZOLIN >=64 RESISTANT Resistant     CEFEPIME <=1 SENSITIVE Sensitive     CEFTAZIDIME <=1 SENSITIVE Sensitive     CEFTRIAXONE <=1 SENSITIVE Sensitive     CIPROFLOXACIN <=0.25 SENSITIVE Sensitive     GENTAMICIN <=1 SENSITIVE Sensitive     TRIMETH/SULFA <=20 SENSITIVE Sensitive     * FEW SERRATIA MARCESCENS    Cliffton Asters, MD [facility] [phone] pager   [phone] cell 10/10/2018, 11:10 AM

## 2018-10-11 ENCOUNTER — Encounter (HOSPITAL_COMMUNITY): Payer: Self-pay | Admitting: Gastroenterology

## 2018-10-11 NOTE — Progress Notes (Cosign Needed)
No plans to follow pt in hospital.  GI signing off.   If no contraindication would start low dose carvedilol for early varices and bleeding prophylaxis. GI available to follow but pt needs to make the appointment.   Historically has not followed up with various MD providers.   Also needs to see ID for tx of Hep B and C  Jennye Moccasin  PA-C.

## 2018-10-11 NOTE — Plan of Care (Signed)
  Problem: Education: Goal: Knowledge of General Education information will improve Description: Including pain rating scale, medication(s)/side effects and non-pharmacologic comfort measures Outcome: Progressing   Problem: Clinical Measurements: Goal: Respiratory complications will improve Outcome: Progressing   Problem: Activity: Goal: Risk for activity intolerance will decrease Outcome: Progressing   Problem: Nutrition: Goal: Adequate nutrition will be maintained Outcome: Progressing   Problem: Elimination: Goal: Will not experience complications related to urinary retention Outcome: Progressing   Problem: Pain Managment: Goal: General experience of comfort will improve Outcome: Progressing   Problem: Safety: Goal: Ability to remain free from injury will improve Outcome: Progressing   

## 2018-10-11 NOTE — Progress Notes (Signed)
Occupational Therapy Treatment Patient Details Name: Dan [name] MRN: [id number] DOB: 1971/09/05 Today's Date: 10/11/2018    History of present illness 47 yo male presented with back pain progressively worsening over the past month causing severe debilitation which he started using IV heroin to treat. 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 Neurosurgery consulted and recommend non-surgical approach with IV antibiotics. 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    OT comments  Pt progressing towards established OT goals and presenting near baseline function. Providing pt with education on safe tub transfers and use of 3N1 as shower seat. Pt performing tub transfer with supervision. Reviewed use of AE for LB ADLs. Answering all questions and providing education. Update dc recommendation to home once medically stable. All acute OT needs met and will sign off. Please re-consult if functional status changes.Thank you.    Follow Up Recommendations  No OT follow up;Supervision/Assistance - 24 hour    Equipment Recommendations  3 in 1 bedside commode    Recommendations for Other Services PT consult    Precautions / Restrictions Precautions Precautions: Fall;Back Precaution Booklet Issued: No Precaution Comments: Reviewed back precautions for pain management. Restrictions Weight Bearing Restrictions: No       Mobility Bed Mobility Overal bed mobility: Modified Independent             General bed mobility comments: Increased time  Transfers Overall transfer level: Needs assistance Equipment used: None Transfers: Sit to/from Stand Sit to Stand: Supervision         General transfer  comment: supervision for safety    Balance Overall balance assessment: Needs assistance Sitting-balance support: Feet supported;No upper extremity supported Sitting balance-Leahy Scale: Fair     Standing balance support: Bilateral upper extremity supported Standing balance-Leahy Scale: Fair                             ADL either performed or assessed with clinical judgement   ADL Overall ADL's : Needs assistance/impaired                       Lower Body Dressing Details (indicate cue type and reason): Reviewed AE for LB ADLs Toilet Transfer: Supervision/safety;Ambulation(simulated in room)       Tub/ Shower Transfer: Supervision/safety;Tub transfer;3 in 1;Ambulation Tub/Shower Transfer Details (indicate cue type and reason): supervision for safety Functional mobility during ADLs: Supervision/safety General ADL Comments: supervision for safety. Near baseline.      Vision       Perception     Praxis      Cognition Arousal/Alertness: Awake/alert Behavior During Therapy: WFL for tasks assessed/performed Overall Cognitive Status: Within Functional Limits for tasks assessed                                 General Comments: a little impulsive. Self limiting behavior.         Exercises     Shoulder Instructions       General Comments      Pertinent Vitals/ Pain       Pain Assessment: Faces Faces Pain Scale: Hurts little more Pain Location: Back Pain Descriptors /  Indicators: Grimacing;Aching;Guarding Pain Intervention(s): Monitored during session;Repositioned  Home Living                                          Prior Functioning/Environment              Frequency  Min 2X/week        Progress Toward Goals  OT Goals(current goals can now be found in the care plan section)  Progress towards OT goals: Progressing toward goals  Acute Rehab OT Goals Patient Stated Goal: for pain to get  better OT Goal Formulation: With patient Time For Goal Achievement: 10/18/18 Potential to Achieve Goals: Good ADL Goals Pt Will Perform Lower Body Bathing: with set-up;with supervision;with adaptive equipment;sit to/from stand Pt Will Perform Lower Body Dressing: with set-up;with supervision;sit to/from stand;with adaptive equipment Pt Will Transfer to Toilet: with set-up;with supervision;ambulating;regular height toilet Pt Will Perform Toileting - Clothing Manipulation and hygiene: with set-up;with supervision;sit to/from stand Pt Will Perform Tub/Shower Transfer: Tub transfer;with set-up;with supervision;3 in 1;ambulating;rolling walker  Plan Discharge plan needs to be updated;Frequency remains appropriate    Co-evaluation                 AM-PAC PT "6 Clicks" Daily Activity     Outcome Measure   Help from another person eating meals?: None Help from another person taking care of personal grooming?: A Little Help from another person toileting, which includes using toliet, bedpan, or urinal?: A Little Help from another person bathing (including washing, rinsing, drying)?: A Little Help from another person to put on and taking off regular upper body clothing?: A Little Help from another person to put on and taking off regular lower body clothing?: A Little 6 Click Score: 19    End of Session    OT Visit Diagnosis: Unsteadiness on feet (R26.81);Other abnormalities of gait and mobility (R26.89);Muscle weakness (generalized) (M62.81);Pain Pain - part of body: (back)   Activity Tolerance Patient tolerated treatment well   Patient Left in bed;with call bell/phone within reach   Nurse Communication Mobility status;Precautions        Time: 2993-7169 OT Time Calculation (min): 10 min  Charges: OT General Charges $OT Visit: 1 Visit OT Treatments $Self Care/Home Management : 8-22 mins  E[place], OTR/L Acute Rehab Pager: [phone] Office:  [place] 10/11/2018, 4:41 PM

## 2018-10-11 NOTE — Plan of Care (Signed)
  Problem: Pain Managment: Goal: General experience of comfort will improve Outcome: Progressing   Problem: Safety: Goal: Ability to remain free from injury will improve Outcome: Progressing   

## 2018-10-11 NOTE — Progress Notes (Signed)
PROGRESS NOTE    Dan [name]  BJY:[id number] DOB: 08/02/1971 DOA: 09/29/2018 PCP: [name], NP    Brief Narrative:  47 y.o. 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 He was afebrile with normal WBC but imaging demonstrated thoracolumbar discitis/osteomyelitis at multiple levels with marked collapse/retropulsion of vertebral body at L3-4 with severe spinal stenosis and abscess extending to bilateral psoas muscles. Neurosurgery was consulted, though the patient has no myelopathic symptoms or signs and medical management was recommended. ID was consulted, recommended vancomycin and ceftriaxone following abscess sampling which was performed 10/19. Abscess and urine cultures grew Serratia, though blood cultures oddly remain negative. 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 ?Hematogenous spread but NG on blood Cx. With IVDU, underwent TEE to r/o endocarditis - Follow blood cultures -Per ID, recommendation to switch to levofloxacin 750mg  qday to complete 6 weeks of tx on 11/27. -Remains stable at this time -ID has signed off -PT has recommended SNF. SW consulted for placement  L3-4 vertebral body collapse,  retropulsion causing severe spinal stenosis: No myelopathy. Neurosurgery consulted, recommending IV antibiotics, no surgery.  - PT/OT continued - Pain control: Significant tolerance, so on oxycontin 30mg  q12h and oxyIR 10-20mg  q3h prn mod/sev pain. Discussed with patient that we anticipate deescalation as infection is treated.  -Will continue with abx as per above  Liver cirrhosis with ascites, portal hypertensive gastropathy, history of bleeding gastric ulcer, thrombocytopenia: Normal mentation without evidence of GI bleeding at present. Pt denies nausea, abd pain.  - Avoid tylenol.  -Continue to follow CMP -No obvious blood loss. Serial CBC has remained stable  Polysubstance abuse, IVDU:  - Cessation counseling provided.  - Will NOT be a candidate for home health IV antibiotics - Remains stable at present  Hepatitis B and C:  - Serologies reviewed -Cont as per ID recs  Moderate protein calorie malnutrition:  - Protein supplemented - Continue as per nutrition  Anemia of chronic disease:  - No obvious blood loss noted - Repeat CBC in aM  DVT prophylaxis: SCD's Code Status: Full Family Communication: Pt in room, family not at bedside Disposition Plan: Uncertain at this time  Consultants:   Neurosurgery, Dr. Maurice Small  Infectious Diseases, Dr. Drue Second  Interventional radiology, Dr. Loreta Ave  Procedures:   Imaging guided L3-L4 disc aspiration for sample 10/01/2018 by Dr. Loreta Ave.  Echocardiogram 10/01/2018: - Left ventricle: The cavity size was normal. Antonacci thickness was normal. Systolic function was normal. The estimated ejection fraction was in the range of 60% to 65%. Andrades motion was normal; there were no regional Swantek motion abnormalities. Left ventricular diastolic function parameters were normal. - Mitral valve: Mildly thickened leaflets . There was trace to mild regurgitation. - Left atrium:  The atrium was normal in size. - Inferior vena cava: The  vessel was normal in size. The respirophasic diameter changes were in the normal range (= 50%), consistent with normal central venous pressure.  Impressions:  - Compared to a prior study in 2017, there have been no changes. No vegetation is noted.  Antimicrobials: Anti-infectives (From admission, onward)   Start     Dose/Rate Route Frequency Ordered Stop   10/10/18 0000  levofloxacin (LEVAQUIN) 750 MG tablet     750 mg Oral Daily 10/10/18 1514 11/09/18 2359   10/06/18 1800  tenofovir (VIREAD) tablet 300 mg     300 mg Oral Daily 10/06/18 1530     10/06/18 0000  Tenofovir Alafenamide Fumarate (VEMLIDY) 25 MG TABS     1 tablet Oral Daily 10/06/18 1500     10/01/18 2000  vancomycin (VANCOCIN) IVPB 1000 mg/200 mL premix  Status:  Discontinued     1,000 mg 200 mL/hr over 60 Minutes Intravenous Every 8 hours 10/01/18 1903 10/03/18 0939   10/01/18 1900  cefTRIAXone (ROCEPHIN) 2 g in sodium chloride 0.9 % 100 mL IVPB     2 g 200 mL/hr over 30 Minutes Intravenous Every 24 hours 10/01/18 1857     09/29/18 2315  piperacillin-tazobactam (ZOSYN) IVPB 3.375 g     3.375 g 100 mL/hr over 30 Minutes Intravenous NOW 09/29/18 2306 09/29/18 2359   09/29/18 2315  vancomycin (VANCOCIN) 1,500 mg in sodium chloride 0.9 % 500 mL IVPB     1,500 mg 250 mL/hr over 120 Minutes Intravenous  Once 09/29/18 2306 09/30/18 0310      Subjective: Feeling well today. Eager to begin therapy at SNF  Objective: Vitals:   10/10/18 1951 10/11/18 0316 10/11/18 0938 10/11/18 1453  BP: 106/69 105/69 95/73 118/75  Pulse: 77 78 81 84  Resp: 20 18    Temp: 97.8 F (36.6 C) 98 F (36.7 C) 98.3 F (36.8 C) 98.2 F (36.8 C)  TempSrc: Oral Oral Oral Oral  SpO2: 97% 100% 96% 100%  Weight:      Height:        Intake/Output Summary (Last 24 hours) at 10/11/2018 1712 Last data filed at 10/11/2018 1634 Gross per 24 hour  Intake 840 ml  Output 3105 ml  Net -2265 ml   Filed Weights   09/29/18 1828 10/01/18  1157 10/07/18 0730  Weight: 63.5 kg 72.8 kg 72.8 kg    Examination: General exam: Awake, laying in bed, in nad Respiratory system: Normal respiratory effort, no wheezing Cardiovascular system: regular rate, s1, s2  Data Reviewed: I have personally reviewed following labs and imaging studies  CBC: Recent Labs  Lab 10/06/18 0247 10/07/18 0240 10/10/18 0436  WBC 3.5* 4.0 5.7  HGB 8.6* 8.2* 10.5*  HCT 28.9* 27.7* 35.0*  MCV 86.5 86.3 85.6  PLT 77* 81* 102*   Basic Metabolic Panel: Recent Labs  Lab 10/06/18 0247  NA 135  K 4.9  CL 101  CO2 28  GLUCOSE 113*  BUN 29*  CREATININE 0.76  CALCIUM 8.3*   GFR: Estimated Creatinine Clearance: 115 mL/min (by C-G formula based on SCr of 0.76 mg/dL). Liver Function Tests: Recent Labs  Lab 10/06/18 0247  AST 46*  ALT 35  ALKPHOS 144*  BILITOT 0.3  PROT 6.6  ALBUMIN 2.0*   No results for input(s): LIPASE, AMYLASE in the last 168 hours. No results for input(s): AMMONIA in the last 168 hours. Coagulation Profile: No results for input(s): INR, PROTIME in  the last 168 hours. Cardiac Enzymes: No results for input(s): CKTOTAL, CKMB, CKMBINDEX, TROPONINI in the last 168 hours. BNP (last 3 results) No results for input(s): PROBNP in the last 8760 hours. HbA1C: No results for input(s): HGBA1C in the last 72 hours. CBG: No results for input(s): GLUCAP in the last 168 hours. Lipid Profile: No results for input(s): CHOL, HDL, LDLCALC, TRIG, CHOLHDL, LDLDIRECT in the last 72 hours. Thyroid Function Tests: No results for input(s): TSH, T4TOTAL, FREET4, T3FREE, THYROIDAB in the last 72 hours. Anemia Panel: No results for input(s): VITAMINB12, FOLATE, FERRITIN, TIBC, IRON, RETICCTPCT in the last 72 hours. Sepsis Labs: No results for input(s): PROCALCITON, LATICACIDVEN in the last 168 hours.  No results found for this or any previous visit (from the past 240 hour(s)).   Radiology Studies: No results found.  Scheduled Meds: .  feeding supplement (ENSURE ENLIVE)  237 mL Oral TID BM  . multivitamin with minerals  1 tablet Oral Daily  . oxyCODONE  30 mg Oral Q12H  . polyethylene glycol  17 g Oral Daily  . senna-docusate  1 tablet Oral BID  . tenofovir  300 mg Oral Daily  . traZODone  100 mg Oral QHS   Continuous Infusions: . cefTRIAXone (ROCEPHIN)  IV 2 g (10/10/18 1831)     LOS: 11 days   Rickey Barbara, MD [facility] Pager On Amion  If 7PM-7AM, please contact night-coverage 10/11/2018, 5:12 PM

## 2018-10-11 NOTE — Progress Notes (Signed)
Physical Therapy Treatment Note  Patient seen for mobility progression. Pt is making progress toward Pt goals and overall supervision/min guard for safety with OOB mobility. Pt educated on back precautions for comfort. Pt will continue to benefit from further skilled PT services to maximize independence and safety with mobility.    10/11/18 1045  PT Visit Information  Last PT Received On 10/11/18  Assistance Needed +1  History of Present Illness 47 yo male presented with back pain progressively worsening over the past month causing severe debilitation which he started using IV heroin to treat. 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 Neurosurgery consulted and recommend non-surgical approach with IV antibiotics. 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   Precautions  Precautions Fall;Back  Precaution Booklet Issued No  Precaution Comments Reviewed back precautions for pain management.  Pain Assessment  Pain Assessment Faces  Faces Pain Scale 4  Pain Location Back  Pain Descriptors / Indicators Grimacing;Aching;Guarding  Pain Intervention(s) Limited activity within patient's tolerance;Monitored during session;Repositioned  Cognition  Arousal/Alertness Awake/alert  Behavior During Therapy WFL for tasks assessed/performed  Overall Cognitive Status Within Functional Limits for tasks assessed  General Comments a little impulsive  Bed Mobility  Overal bed mobility Needs Assistance  Bed Mobility Supine to Sit  Supine to sit Supervision  General bed mobility comments use of rail; cues for log roll technique for comfort  Transfers  Overall transfer level Needs assistance  Equipment used None  Transfers  Sit to/from Stand  Sit to Stand Min guard  General transfer comment for safety  Ambulation/Gait  Ambulation/Gait assistance Min guard;Min assist  Gait Distance (Feet) 100 Feet  Assistive device None  Gait Pattern/deviations Step-through pattern;Decreased stride length;Trunk flexed;Drifts right/left  General Gait Details cues for posture; pt rubbing eyes while ambulating and experienced LOB while eyes closed requiring min A to recover; pt declined ambulating farther due to c/o pain  Balance  Overall balance assessment Needs assistance  Sitting-balance support Feet supported;No upper extremity supported  Sitting balance-Leahy Scale Fair  Standing balance-Leahy Scale Fair  PT - End of Session  Equipment Utilized During Treatment Gait belt  Activity Tolerance Patient limited by pain  Patient left with call bell/phone within reach;in chair;with nursing/sitter in room  Nurse Communication Mobility status   PT - Assessment/Plan  PT Plan Current plan remains appropriate  PT Visit Diagnosis Other abnormalities of gait and mobility (R26.89);Muscle weakness (generalized) (M62.81);Difficulty in walking, not elsewhere classified (R26.2);Pain;History of falling (Z91.81)  Pain - part of body  (back )  PT Frequency (ACUTE ONLY) Min 3X/week  Follow Up Recommendations HHPT;Supervision/Assistance - 24 hour  PT equipment None recommended by PT  AM-PAC PT "6 Clicks" Daily Activity Outcome Measure  Difficulty turning over in bed (including adjusting bedclothes, sheets and blankets)? 2  Difficulty moving from lying on back to sitting on the side of the bed?  1  Difficulty sitting down on and standing up from a chair with arms (e.g., wheelchair, bedside commode, etc,.)? 1  Help needed moving to and from a bed to chair (including a wheelchair)? 3  Help needed walking in hospital room? 3  Help needed climbing 3-5 steps with a railing?  3  6 Click Score 13  Mobility G Code  CK  PT Goal  Progression   Progress towards PT goals Progressing toward goals  PT Time Calculation  PT Start Time (ACUTE ONLY) 1033  PT Stop Time (ACUTE ONLY) 1045  PT Time Calculation (min) (ACUTE ONLY) 12 min  PT General Charges  $$ ACUTE PT VISIT 1 Visit  PT Treatments  $Gait Training 8-22 mins   Erline Levine, PTA Acute Rehabilitation Services Pager: [phone] Office: [phone]

## 2018-10-12 DIAGNOSIS — M4649 Discitis, unspecified, multiple sites in spine: Secondary | ICD-10-CM

## 2018-10-12 DIAGNOSIS — B182 Chronic viral hepatitis C: Secondary | ICD-10-CM

## 2018-10-12 LAB — GLUCOSE, CAPILLARY: Glucose-Capillary: 128 mg/dL — ABNORMAL HIGH (ref 70–99)

## 2018-10-12 MED ORDER — SENNOSIDES-DOCUSATE SODIUM 8.6-50 MG PO TABS: 1.0000 | ORAL_TABLET | Freq: Two times a day (BID) | ORAL | 0 refills | Status: DC

## 2018-10-12 MED ORDER — PANTOPRAZOLE SODIUM 40 MG PO TBEC: 40.0000 mg | DELAYED_RELEASE_TABLET | Freq: Every day | ORAL | 0 refills | Status: DC

## 2018-10-12 MED ORDER — OXYCODONE HCL ER 30 MG PO T12A: 30.0000 mg | EXTENDED_RELEASE_TABLET | Freq: Two times a day (BID) | ORAL | 0 refills | Status: DC

## 2018-10-12 MED ORDER — POLYETHYLENE GLYCOL 3350 17 G PO PACK: 17.0000 g | PACK | Freq: Every day | ORAL | 0 refills | Status: DC

## 2018-10-12 MED ORDER — ADULT MULTIVITAMIN W/MINERALS CH: 1.0000 | ORAL_TABLET | Freq: Every day | ORAL | Status: DC

## 2018-10-12 MED ORDER — ENSURE ENLIVE PO LIQD: 237.0000 mL | Freq: Three times a day (TID) | ORAL | Status: DC

## 2018-10-12 MED ORDER — OXYCODONE HCL 10 MG PO TABS: 10.0000 mg | ORAL_TABLET | Freq: Four times a day (QID) | ORAL | 0 refills | Status: DC | PRN

## 2018-10-12 NOTE — Progress Notes (Signed)
Nutrition Follow-up  DOCUMENTATION CODES:   Non-severe (moderate) malnutrition in context of social or environmental circumstances  INTERVENTION:    Continue Ensure Enlive po TID, each supplement provides 350 kcal and 20 grams of protein  NUTRITION DIAGNOSIS:   Moderate Malnutrition related to social / environmental circumstances(IVDU) as evidenced by mild fat depletion, moderate muscle depletion.  Ongoing  GOAL:   Patient will meet greater than or equal to 90% of their needs  Met with intake of meals and supplements  MONITOR:   PO intake, Supplement acceptance, Weight trends, Labs, I & O's   ASSESSMENT:   47 y/o male PMHx Cirrhosis, Polysubstance abuse, Hep B, Hep C. Presented w/ complaint of back pain, subjective fevers/chills and poor intake at home. Admits he is using IVD at home. Imaging revealed discitis and L3/L4 abscess into bilateral psoas muscle. Admitted for management.   Patient reports good appetite and intake. He is consuming 100% of meals and drinking Ensure supplements a few times per day.   Labs and medications reviewed. Continues to receive IV antibiotics.  Diet Order:   Diet Order            Diet Heart Room service appropriate? Yes; Fluid consistency: Thin  Diet effective now              EDUCATION NEEDS:   No education needs have been identified at this time  Skin:  Skin Assessment: Reviewed RN Assessment  Last BM:  10/19  Height:   Ht Readings from Last 1 Encounters:  10/07/18 '5\' 11"'  (1.803 m)    Weight:   Wt Readings from Last 1 Encounters:  10/07/18 72.8 kg    Ideal Body Weight:  78.18 kg  BMI:  Body mass index is 22.38 kg/m.  Estimated Nutritional Needs:   Kcal:  2200-2400  Protein:  105-120 gm  Fluid:  2.2-2.4 L    Molli Barrows, RD, LDN, CNSC Pager [phone] After Hours Pager [phone]

## 2018-10-12 NOTE — Progress Notes (Signed)
Pt given prescriptions, discharge instructions, doctors letter and medication from pharmacy. Instructions gone over with him, RN stressed importance of follow up appointments. Pt verbalized understanding. Pt's equipment delivered to room and taken with him. All belongings gathered to be sent home with pt.

## 2018-10-12 NOTE — Progress Notes (Signed)
PT Cancellation Note  Patient Details Name: Dan [name] MRN: [id number] DOB: 1971-05-30   Cancelled Treatment:    Reason Eval/Treat Not Completed: Patient declined, no reason specified Pt declined participating in therapy and reports he "walked with the doctor this morning". PT will continue to follow acutely.    Derek Mound, PTA Acute Rehabilitation Services Pager: [phone] Office: [phone]   10/12/2018, 11:55 AM

## 2018-10-12 NOTE — Discharge Summary (Signed)
Physician Discharge Summary  Dan [name]:[id number] DOB: 1971/05/30  PCP: [name], NP  Admit date: 09/29/2018 Discharge date: 10/12/2018  Recommendations for Outpatient Follow-up:  1. Mauricio Po, NP/PCP in 3 days with repeat labs (CBC & CMP).  Patient also needs to follow-up regarding pain management. 2. Dr. Cliffton Asters, ID in 2 to 3 weeks.  Office will arrange follow-up. 3. Dr. Leanora Ivanoff, [facility]  Home Health: PT and OT Equipment/Devices: 3 n 1  Discharge Condition: Improved and stable CODE STATUS: Full Diet recommendation: Heart healthy diet.  Discharge Diagnoses:  Principal Problem:   Discitis of thoracolumbar region Active Problems:   Other cirrhosis of liver (HCC)   Chronic hepatitis B (HCC)   Chronic hepatitis C without hepatic coma (HCC)   IV drug abuse (HCC)   Malnutrition of moderate degree   Alcoholic cirrhosis of liver with ascites (HCC)   Psoas abscess [facility])   Brief Summary: 47 year old male with PMH of liver cirrhosis due to untreated hepatitis C, hepatitis B, prior upper GI bleed, portal gastropathy, polysubstance abuse including IV heroin, chronic pain, presented to the ED on 09/30/2018 due to progressively worsening back pain over the last month.  He was using IV drugs to treat his pain.  He reported subjective fevers and chills at home.  He also had poor oral intake.  In the ED, afebrile, normotensive, not hypoxic.  Imaging demonstrated thoracolumbar discitis/osteomyelitis at multiple levels with marked collapse/retropulsion of vertebral body at L3- 4 with severe spinal stenosis and abscess extending to bilateral psoas muscles.  Neurosurgery was consulted, patient lacked myelopathic signs and symptoms, medical management was recommended.  ID consulted.  [facility] consulted for EGD prior to TEE by cardiology.   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  ?Hematogenous spread but NG on blood Cx. With IVDU, underwent TEE which was negative for vegetations. -Blood cultures from 09/29/2018: Negative. -Per ID, recommendation to switch to levofloxacin 750mg  qday to complete 6 weeks of tx on 11/27.  Levofloxacin has been arranged through [facility] pharmacy. - Improved and stable.  ID signed off and will follow-up in clinic in 2 to 3 weeks. -Patient has ongoing pain issues and remains on OxyContin and as needed OxyIR.  He has been advised that he will be given a short supply of these pain medications until very close outpatient follow-up with his PCP for further medication refills.  He has been counseled extensively that he should not abuse these medications or any other substances and he verbalizes understanding.   -PT follow-up recommended home health and OT did not have any outpatient recommendations.  L3-4 vertebral body collapse, retropulsion causing severe spinal stenosis: No myelopathy. Neurosurgery consulted, recommending antibiotics, no surgery.  - Pain control: Significant tolerance, soon oxycontin 30mg  q12h and oxyIR 10-20mg  q3h prn mod/sev pain.  -Pain management discussion as above.  May consider outpatient pain consultation if has ongoing pain issues.  Liver cirrhosis with ascites, portal hypertensive gastropathy, history of bleeding gastric ulcer, thrombocytopenia: Normal mentation without evidence of GI bleeding at present. Pt denies nausea, abd pain.  - Avoid tylenol.  -Continue to follow CMP periodically. -No obvious blood loss. Serial CBC has remained stable -Patient underwent EGD prior to TEE (to make sure it was safe to proceed with TEE) with findings and recommendations as below. -As per recommendations, start Protonix 40 mg daily.  Outpatient follow-up with PCP at which time consider starting beta-blockers  to decrease bleeding risk from his varices in the future.  -   Polysubstance abuse, IVDU:  - Cessation counseling provided.   - Will NOT be a candidate for home health IV antibiotics - Remains stable at present  Hepatitis B and C:  - Serologies reviewed -Cont as per ID recs >patient was started on Vemlidy for chronic hepatitis B which was sourced for him through the [facility] pharmacy. -Outpatient follow-up with infectious disease.  Moderate protein calorie malnutrition:  - Protein supplemented - Continue as per nutrition  Anemia of chronic disease:  - No obvious blood loss noted -Stable.  Thrombocytopenia Likely due to liver disease.  Stable.  Chronic pain Management as discussed above.  With pharmacy assistance, reviewed [place] controlled substance database and patient last filled medication/Suboxone in May 2018 and none since.   Consultants:   Neurosurgery, Dr. Maurice Small  Infectious Diseases, Dr. Drue Second  Interventional radiology, Dr. Sh[facility]  Procedures:   Imaging guided L3-L4 disc aspiration for sample 10/01/2018 by Dr. Loreta Ave.  Echocardiogram 10/01/2018: - Left ventricle: The cavity size was normal. Lacombe thickness was normal. Systolic function was normal. The estimated ejection fraction was in the range of 60% to 65%. Elsayed motion was normal; there were no regional Haberle motion abnormalities. Left ventricular diastolic function parameters were normal. - Mitral valve: Mildly thickened leaflets . There was trace to mild regurgitation. - Left atrium: The atrium was normal in size. - Inferior vena cava: The vessel was normal in size. The respirophasic diameter changes were in the normal range (= 50%), consistent with normal central venous pressure.  Impressions:  - Compared to a prior study in 2017, there have been no changes. No vegetation is noted.  EGD 10/10/2018:  No gross lesions in esophagus. - Grade I and small (< 5 mm) esophageal varices. - Nodular mucosa in the esophagus. Biopsied to rule out Barrett's. - Portal hypertensive  gastropathy. - Non-bleeding erosive gastropathy. - No other gross lesions in the stomach. Biopsied for HP. - Mucosal changes in the duodenum. 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 - No gross lesions in the second portion of the duodenum.   Recommendations:  - From my perspective, if I were considering performing an Endoscopic Ultrasound of a lesion in the abdominal cavity, I would not feel uncomfortable to proceed. I will discuss this with the Cardiologist as they consider possible TEE to follow. - Would initiate beta-blockade for patient in the coming days as able to decrease risk of bleeding from these varices in the future (query carvedilol v non-selective betablockade). - Will relay this information to inpatient GI service who will be following along. - Await pathology results. - Minimize NSAID use. - PPI 40 mg QD to continue at least. - Follow up with a Gastroenterologist may be reasonable as he has not been seen in the outpatient setting (only Inpatient setting). - The findings and recommendations were discussed with the referring physician.   TEE 10/10/2018:  Study Conclusions  - Left ventricle: Systolic function was normal. The estimated   ejection fraction was in the range of 60% to 65%. Orban motion was   normal; there were no regional Hakanson motion abnormalities. - Aortic valve: No evidence of vegetation. - Mitral valve: No evidence of vegetation. There was mild   regurgitation. - Left atrium: No evidence of thrombus in the atrial cavity or   appendage. No evidence of thrombus in the atrial cavity or   appendage. - Right atrium: No evidence  of thrombus in the atrial cavity or   appendage. - Tricuspid valve: No evidence of vegetation. - Pulmonic valve: No evidence of vegetation.  Impressions:  - There was no evidence of a vegetation  Discharge Instructions  Discharge Instructions    Call MD for:  difficulty breathing, headache or visual  disturbances   Complete by:  As directed    Call MD for:  extreme fatigue   Complete by:  As directed    Call MD for:  persistant dizziness or light-headedness   Complete by:  As directed    Call MD for:  persistant nausea and vomiting   Complete by:  As directed    Call MD for:  severe uncontrolled pain   Complete by:  As directed    Call MD for:  temperature >100.4   Complete by:  As directed    Diet - low sodium heart healthy   Complete by:  As directed    Increase activity slowly   Complete by:  As directed        Medication List    STOP taking these medications   cephALEXin 500 MG capsule Commonly known as:  KEFLEX   oxyCODONE-acetaminophen 5-325 MG tablet Commonly known as:  PERCOCET/ROXICET     TAKE these medications   feeding supplement (ENSURE ENLIVE) Liqd Take 237 mLs by mouth 3 (three) times daily between meals.   levofloxacin 750 MG tablet Commonly known as:  LEVAQUIN Take 1 tablet (750 mg total) by mouth daily.   multivitamin with minerals Tabs tablet Take 1 tablet by mouth daily. Start taking on:  10/13/2018   Oxycodone HCl 10 MG Tabs Take 1-2 tablets (10-20 mg total) by mouth every 6 (six) hours as needed for moderate pain or severe pain.   oxyCODONE 30 MG 12 hr tablet Take 1 tablet (30 mg total) by mouth every 12 (twelve) hours.   pantoprazole 40 MG tablet Commonly known as:  PROTONIX Take 1 tablet (40 mg total) by mouth daily.   polyethylene glycol packet Commonly known as:  MIRALAX / GLYCOLAX Take 17 g by mouth daily. Start taking on:  10/13/2018   senna-docusate 8.6-50 MG tablet Commonly known as:  Senokot-S Take 1 tablet by mouth 2 (two) times daily.   Tenofovir Alafenamide Fumarate 25 MG Tabs Take 1 tablet (25 mg total) by mouth daily.      Follow-up Information    [name], NP. Schedule an appointment as soon as possible for a visit in 3 day(s).   Why:  To be seen with repeat labs (CBC & CMP).  Also follow-up regarding  pain management. Contact information: [address] [place] [address] [phone]        Mansouraty, Netty Starring., MD. Schedule an appointment as soon as possible for a visit.   Specialties:  Gastroenterology, Internal Medicine Contact information: [address] [place] [address] [phone]        Cliffton Asters, MD. Schedule an appointment as soon as possible for a visit in 2 week(s).   Specialty:  Infectious Diseases Why:  Office will also call to arrange follow-up in 2-3 weeks. Contact information: [address] [place] [address] [phone]          No Known Allergies    Procedures/Studies: Dg Chest 2 View  Result Date: 09/29/2018 CLINICAL DATA:  Fever, body aches. EXAM: CHEST - 2 VIEW COMPARISON:  Radiograph 08/21/2017 FINDINGS: Curvilinear lucencies paralleling the right chest [place]more consistent  with skin folds, lung markings are seen extending to the pleural surface. Similar appearance in the left mid thorax to a lesser extent. Chronic bronchial thickening and right upper lobe scarring. No acute airspace disease. Minimal blunting of right costophrenic angle, possible trace effusion. No pulmonary edema. No acute osseous abnormalities. IMPRESSION: 1. Chronic bronchitic change in right upper lobe scarring. No acute airspace disease. 2. Curvilinear lucencies paralleling right greater than left chest walls consistent with skin folds. Electronically Signed   By: Narda Rutherford M.D.   On: 09/29/2018 23:15   Ct Head Wo Contrast  Result Date: 09/30/2018 CLINICAL DATA:  Subacute left vision loss, unable to see out of left eye for 1 month EXAM: CT HEAD WITHOUT CONTRAST TECHNIQUE: Contiguous axial images were obtained from the base of the skull through the vertex without intravenous contrast. COMPARISON:  CT brain 05/30/2013 FINDINGS: Brain: No evidence of acute infarction, hemorrhage, hydrocephalus, extra-axial collection or mass lesion/mass effect.  Vascular: No hyperdense vessel or unexpected calcification. Skull: Normal. Negative for fracture or focal lesion. Sinuses/Orbits: No acute finding. Other: None IMPRESSION: Negative.  No CT evidence for acute intracranial abnormality Electronically Signed   By: Jasmine Pang M.D.   On: 09/30/2018 00:33   Mr Lumbar Spine W Wo Contrast  Result Date: 09/30/2018 CLINICAL DATA:  47 y/o  M; abdominal pain, back pain, hematuria. EXAM: MRI LUMBAR SPINE WITHOUT AND WITH CONTRAST TECHNIQUE: Multiplanar and multiecho pulse sequences of the lumbar spine were obtained without and with intravenous contrast. CONTRAST:  6.3 cc Gadavist COMPARISON:  09/30/2018 CT abdomen and pelvis. 05/09/2018 lumbar spine radiographs. 09/19/2011 lumbar spine MRI. FINDINGS: Segmentation:  Standard. Alignment:  Physiologic. Vertebrae: T12-L1 discitis osteomyelitis. Associated anterior and lateral epidural enhancement extending from mid T11 to lower L1 with moderate stenosis of the thecal sac. Additionally, there is paravertebral soft tissue inflammation extending into the bilateral T12-L1 neural foramen. No discrete epidural abscess or paravertebral abscess at these levels. L3-4 discitis osteomyelitis with destructive changes of the opposing endplates, mild-to-moderate loss of vertebral body height, and disc abscess extending into the psoas muscle insertions bilaterally. The right-sided psoas abscess measures 3.8 x 2.1 cm (series 12, image 23) and the left-sided psoas abscess measures 3.0 x 1.7 cm. Paravertebral soft tissue inflammation extends into the bilateral L3-4 neural foramen which are effaced. Additionally, collapse and retropulsion of the posterior aspects of the L3 and L4 vertebral bodies results in severe spinal canal stenosis. No epidural abscess at this level. No additional level of discitis osteomyelitis identified. Conus medullaris and cauda equina: Conus extends to the L1 level. Conus and cauda equina appear normal. No abnormal  enhancement. Paraspinal and other soft tissues: As above. Cholelithiasis. Splenomegaly. Disc levels: T12-L1: Epidural soft tissue thickening results in moderate stenosis of the thecal sac. Paravertebral soft tissue inflammation extends into the neural foramen bilaterally. L1-2: Mild disc bulge and facet hypertrophy. No significant foraminal or canal stenosis. L2-3: Mild disc bulge and facet hypertrophy. No significant foraminal or canal stenosis. L3-4: Collapse and retropulsion of the posterior aspects of L3 and L4 vertebral bodies combined with facet hypertrophy. Moderate bilateral foraminal stenosis and severe canal stenosis. L4-5: Moderate disc bulge and mild facet hypertrophy. Mild bilateral foraminal stenosis and moderate canal stenosis. L5-S1: Mild disc bulge with small central protrusion, central annular fissure, and endplate marginal osteophytes. Mild bilateral foraminal stenosis. No significant canal stenosis. IMPRESSION: 1. T12-L1 discitis osteomyelitis. Associated inflammation and thickening extending from T11-L1 results in moderate stenosis of the thecal sac. No discrete paravertebral or  epidural abscess at this level. 2. L3-4 discitis osteomyelitis with moderate collapse of vertebral bodies and bony retropulsion. Bony retropulsion results in severe spinal canal stenosis. 3. L3-4 disc abscess extending into bilateral psoas muscles. No epidural component. 4. Mild discogenic degenerative changes at additional lumbar spinal levels. Electronically Signed   By: Mitzi Hansen M.D.   On: 09/30/2018 05:32   Ct Abdomen Pelvis W Contrast  Result Date: 09/30/2018 CLINICAL DATA:  Abd pain, acute, generalized. Patient reports bilateral leg pain and abdominal distension. Patient reports low back pain for "forever". EXAM: CT ABDOMEN AND PELVIS WITH CONTRAST TECHNIQUE: Multidetector CT imaging of the abdomen and pelvis was performed using the standard protocol following bolus administration of  intravenous contrast. CONTRAST:  ISOVUE-300 IOPAMIDOL (ISOVUE-300) INJECTION 61% COMPARISON:  Abdominal CT 11/19/2016. Lumbar spine radiograph 05/09/2018. FINDINGS: Lower chest: Distal esophageal Seidl thickening. Minimal subpleural scarring in the right lower lobe. No pleural fluid. Hepatobiliary: Cirrhotic hepatic morphology with nodular contours. Diffusely heterogeneous hepatic parenchyma without evidence of focal mass. Gallbladder physiologically distended, no calcified stone. No biliary dilatation. Pancreas: No ductal dilatation or inflammation. Spleen: Splenomegaly with spleen measuring 21.1 cm cranial caudal. No focal abnormality. Adrenals/Urinary Tract: No adrenal nodule. No hydronephrosis or perinephric edema. Homogeneous renal enhancement with symmetric excretion on delayed phase imaging. Urinary bladder is only minimally distended and not well evaluated. Stomach/Bowel: Bowel evaluation is limited in the absence of enteric contrast and paucity of intra-abdominal fat. There is Keeter thickening of the distal esophagus. Equivocal gastric Horwitz thickening versus nondistention. No small bowel dilatation or obstruction. Small to moderate volume of stool throughout the colon. Appendix tentatively identified, no evidence of appendicitis. Vascular/Lymphatic: Aortic atherosclerosis without aneurysm. Dilatation of the main portal vein at 2 cm, no visualized portal vein thrombus. Retroaortic left renal vein. Multiple prominent retroperitoneal nodes are likely reactive. Prominent portacaval node measuring 17 mm is nonspecific. Reproductive: Prostate is unremarkable. Other: Small volume of perihepatic ascites. Generalized mesenteric edema. No free air. Musculoskeletal: Bony destruction centered at L3-L4 with disc space loss and associated endplate irregularity. Ill-defined perivertebral fluid collection within the right ileus psoas muscle, for example image 46 series 2 consistent with abscess. Posterior extent causes  mass effect on the spinal canal. There is irregular widening of the right sacroiliac joint that has progressed from right hip CT 05/11/2018. Endplate irregularity with disc space narrowing at T12-L1 is new from lumbar spine radiographs. IMPRESSION: 1. 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 Additional endplate irregularity at T12-L1 is suspicious for additional sites of discitis/osteomyelitis. Progressive right sacroiliac joint widening and irregularity suspicious for septic joint. Recommend MRI to characterize extent and evaluate for epidural abscess. 2. Cirrhosis with portal hypertension and splenomegaly. 3. Distal esophageal Belfield thickening may be secondary to varices. 4.  Aortic Atherosclerosis (ICD10-I70.0). These results were called by telephone at the time of interpretation on 09/30/2018 at 12:45 am to [facility] , who verbally acknowledged these results. Electronically Signed   By: Narda Rutherford M.D.   On: 09/30/2018 00:47   Ir Lumbar Disc Aspiration W/img Guide  Result Date: 10/01/2018 INDICATION: 47 year old male with a history of discitis/osteomyelitis. He has been referred for diagnostic aspiration EXAM: IMAGE GUIDED DISC ASPIRATION FOR DIAGNOSTIC PURPOSE MEDICATIONS: The patient is currently admitted to the hospital and receiving intravenous antibiotics. The antibiotics were administered within an appropriate time frame prior to the initiation of the procedure. ANESTHESIA/SEDATION: Fentanyl 4.0 mcg IV; Versed 100 mg IV  Moderate Sedation Time:  11 minutes The patient was continuously monitored during the procedure by the interventional radiology nurse under my direct supervision. COMPLICATIONS: None PROCEDURE: The procedure risks, benefits, and alternatives were explained to the patient. Questions regarding the procedure were encouraged and answered. The patient understands and consents to the  procedure. Patient position prone position. Scout images of the lumbar spine were stored and sent to PACs. The skin of the lumbar spine was prepped with Chlorhexidine in a sterile fashion, and a sterile drape was applied covering the operative field. A sterile gown and sterile gloves were used for the procedure. Local anesthesia was provided with 1% Lidocaine. Using biplane fluoroscopy, a right posterior oblique approach was performed. 1% lidocaine was used for local anesthesia with deep 1% lidocaine. 18 gauge trocar was advanced under x-ray guidance to the disc space. Sample of approximately 2 cc of fluid was aspirated and sent for culture. Needle was removed and a sterile bandage was placed. Manual pressure was used for hemostasis and a sterile dressing was placed. No complications were encountered no significant blood loss was encountered. Patient tolerated the procedure well and remained hemodynamically stable throughout. IMPRESSION: Status post disc aspiration of L3-L4 fluid for diagnostic sample. Signed, Yvone Neu. Reyne Dumas, RPVI [facility] Radiology Electronically Signed   By: Gilmer Mor D.O.   On: 10/01/2018 10:47      Subjective: Ongoing low back pain, bilateral hip pain, left greater than right.  Rates it at 8/10 in severity, reduces some with pain medications.  Having BMs.  No nausea vomiting.  Denies fever or chills or any other complaints.  Reports ambulating comfortably in the halls.  As per RN, no acute issues noted.  Discharge Exam:  Vitals:   10/11/18 1453 10/11/18 2018 10/12/18 0419 10/12/18 1409  BP: 118/75 104/73 93/60 99/66   Pulse: 84 85 82 79  Resp:  16 16 15   Temp: 98.2 F (36.8 C) (!) 97.5 F (36.4 C) 98.3 F (36.8 C) 98.3 F (36.8 C)  TempSrc: Oral Oral Oral Oral  SpO2: 100% 97% 96%   Weight:      Height:        General: Pleasant young male, moderately built and nourished lying comfortably supine in  bed. Cardiovascular: S1 & S2 heard, RRR, S1/S2 +. No murmurs, rubs, gallops or clicks. No JVD or pedal edema.  Telemetry personally reviewed: Sinus rhythm. Respiratory: Clear to auscultation without wheezing, rhonchi or crackles. No increased work of breathing. Abdominal:  Non distended, non tender & soft. No organomegaly or masses appreciated. Normal bowel sounds heard. CNS: Alert and oriented. No focal deficits. Extremities: no edema, no cyanosis    The results of significant diagnostics from this hospitalization (including imaging, microbiology, ancillary and laboratory) are listed below for reference.       Labs: CBC: Recent Labs  Lab 10/06/18 0247 10/07/18 0240 10/10/18 0436  WBC 3.5* 4.0 5.7  HGB 8.6* 8.2* 10.5*  HCT 28.9* 27.7* 35.0*  MCV 86.5 86.3 85.6  PLT 77* 81* 102*   Basic Metabolic Panel: Recent Labs  Lab 10/06/18 0247  NA 135  K 4.9  CL 101  CO2 28  GLUCOSE 113*  BUN 29*  CREATININE 0.76  CALCIUM 8.3*   Liver Function Tests: Recent Labs  Lab 10/06/18 0247  AST 46*  ALT 35  ALKPHOS 144*  BILITOT 0.3  PROT 6.6  ALBUMIN 2.0*   CBG: Recent Labs  Lab 10/12/18 1110  GLUCAP 128*  Urinalysis    Component Value Date/Time   COLORURINE AMBER (A) 09/29/2018 2332   APPEARANCEUR HAZY (A) 09/29/2018 2332   LABSPEC 1.021 09/29/2018 2332   PHURINE 7.0 09/29/2018 2332   GLUCOSEU NEGATIVE 09/29/2018 2332   HGBUR NEGATIVE 09/29/2018 2332   BILIRUBINUR NEGATIVE 09/29/2018 2332   KETONESUR NEGATIVE 09/29/2018 2332   PROTEINUR NEGATIVE 09/29/2018 2332   NITRITE NEGATIVE 09/29/2018 2332   LEUKOCYTESUR NEGATIVE 09/29/2018 2332      Time coordinating discharge: 40 minutes  SIGNED:  Marcellus Scott, MD, FACP, [facility]. [facility] Pager [phone]  If 7PM-7AM, please contact night-coverage [email or web address] Password [email or web address] 10/12/2018, 3:18 PM

## 2018-10-12 NOTE — Care Management Note (Signed)
Case Management Note  Patient Details  Name: HERMENEGILDO CLAUSEN MRN: [id number] Date of Birth: [birth year]  Subjective/Objective:  47 yr old male admitted with discitis of L3-L4.               Action/Plan: Case manager spoke with patient xconcerning discharge plan and DME. Referral for Home Health was called to Shon Millet, [facility] liaison. Patient says he lives with his mom.     Expected Discharge Date:  10/12/18               Expected Discharge Plan:  Home w Home Health Services  In-House Referral:  NA  Discharge planning Services  CM Consult, Medication Assistance  Post Acute Care Choice:  Home Health, Durable Medical Equipment Choice offered to:  Patient  DME Arranged:  3-N-1 DME Agency:  [facility]  HH Arranged:  PT [facility] Agency:  [facility]  Status of Service:  Completed, signed off  If discussed at Long Length of Stay Meetings, dates discussed:    Additional Comments:  Durenda Guthrie, RN 10/12/2018, 3:39 PM

## 2018-10-12 NOTE — Discharge Instructions (Addendum)
Please get your medications reviewed and adjusted by your Primary MD. ° °Please request your Primary MD to go over all Hospital Tests and Procedure/Radiological results at the follow up, please get all Hospital records sent to your Prim MD by signing hospital release before you go home. ° °If you had Pneumonia of Lung problems at the Hospital: °Please get a 2 view Chest X ray done in 6-8 weeks after hospital discharge or sooner if instructed by your Primary MD. ° °If you have Congestive Heart Failure: °Please call your Cardiologist or Primary MD anytime you have any of the following symptoms:  °1) 3 pound weight gain in 24 hours or 5 pounds in 1 week  °2) shortness of breath, with or without a dry hacking cough  °3) swelling in the hands, feet or stomach  °4) if you have to sleep on extra pillows at night in order to breathe ° °Follow cardiac low salt diet and 1.5 lit/day fluid restriction. ° °If you have diabetes °Accuchecks 4 times/day, Once in AM empty stomach and then before each meal. °Log in all results and show them to your primary doctor at your next visit. °If any glucose reading is under 80 or above 300 call your primary MD immediately. ° °If you have Seizure/Convulsions/Epilepsy: °Please do not drive, operate heavy machinery, participate in activities at heights or participate in high speed sports until you have seen by Primary MD or a Neurologist and advised to do so again. ° °If you had Gastrointestinal Bleeding: °Please ask your Primary MD to check a complete blood count within one week of discharge or at your next visit. Your endoscopic/colonoscopic biopsies that are pending at the time of discharge, will also need to followed by your Primary MD. ° °Get Medicines reviewed and adjusted. °Please take all your medications with you for your next visit with your Primary MD ° °Please request your Primary MD to go over all hospital tests and procedure/radiological results at the follow up, please ask your  Primary MD to get all Hospital records sent to his/her office. ° °If you experience worsening of your admission symptoms, develop shortness of breath, life threatening emergency, suicidal or homicidal thoughts you must seek medical attention immediately by calling 911 or calling your MD immediately  if symptoms less severe. ° °You must read complete instructions/literature along with all the possible adverse reactions/side effects for all the Medicines you take and that have been prescribed to you. Take any new Medicines after you have completely understood and accpet all the possible adverse reactions/side effects.  ° °Do not drive or operate heavy machinery when taking Pain medications.  ° °Do not take more than prescribed Pain, Sleep and Anxiety Medications ° °Special Instructions: If you have smoked or chewed Tobacco  in the last 2 yrs please stop smoking, stop any regular Alcohol  and or any Recreational drug use. ° °Wear Seat belts while driving. ° °Please note °You were cared for by a hospitalist during your hospital stay. If you have any questions about your discharge medications or the care you received while you were in the hospital after you are discharged, you can call the unit and asked to speak with the hospitalist on call if the hospitalist that took care of you is not available. Once you are discharged, your primary care physician will handle any further medical issues. Please note that NO REFILLS for any discharge medications will be authorized once you are discharged, as it is imperative that you   return to your primary care physician (or establish a relationship with a primary care physician if you do not have one) for your aftercare needs so that they can reassess your need for medications and monitor your lab values. ° °You can reach the hospitalist office at phone [phone] or fax [phone] °  °If you do not have a primary care physician, you can call [phone] for a physician  referral. ° ° °Pain Medicine Instructions °How can pain medicine affect me? °You were given a prescription for pain medicine. This medicine may make you tired or drowsy and may affect your ability to think clearly. Pain medicine may also affect your ability to drive or perform certain physical activities. It may not be possible to make all of your pain go away, but you should be comfortable enough to move, breathe, and take care of yourself. °How often should I take pain medicine and how much should I take? °· Take pain medicine only as directed by your health care provider and only as needed for pain. °· You do not need to take pain medicine if you are not having pain, unless directed by your health care provider. °· You can take less than the prescribed dose if you find that a smaller amount of medicine controls your pain. °What restrictions do I have while taking pain medicine? °Follow these instructions after you start taking pain medicine, while you are taking the medicine, and for 8 hours after you stop taking the medicine: °· Do not drive. °· Do not operate machinery. °· Do not operate power tools. °· Do not sign legal documents. °· Do not drink alcohol. °· Do not take sleeping pills. °· Do not supervise children by yourself. °· Do not participate in activities that require climbing or being in high places. °· Do not enter a body of water--such as a lake, river, ocean, spa, or swimming pool--without an adult nearby who can monitor and help you. ° °How can I keep others safe while I am taking pain medicine? °· Store your pain medicine as directed by your health care provider. Make sure that it is placed where children and pets cannot reach it. °· Never share your pain medicine with anyone. °· Do not save any leftover pills. If you have any leftover pain medicine, get rid of it or destroy it as directed by your health care provider. °What else do I need to know about taking pain medicine? °· Use a stool  softener if you become constipated from your pain medicine. Increasing your intake of fruits and vegetables will also help with constipation. °· Write down the times when you take your pain medicine. Look at the times before you take your next dose of medicine. It is easy to become confused while on pain medicine. Recording the times helps you to avoid an overdose. °· If your pain is severe, do not try to treat it yourself by taking more pills than instructed on your prescription. Contact your health care provider for help. °· You may have been prescribed a pain medicine that contains acetaminophen. Do not take any other acetaminophen while taking this medicine. An overdose of acetaminophen can result in severe liver damage. Acetaminophen is found in many over-the-counter (OTC) and prescription medicines. If you are taking any medicines in addition to your pain medicine, check the active ingredients on those medicines to see if acetaminophen is listed. °When should I call my health care provider? °· Your medicine is not helping to make   the pain go away. °· You vomit or have diarrhea shortly after taking the medicine. °· You develop new pain in areas that did not hurt before. °· You have an allergic reaction to your medicine. This may include: °? Itchiness. °? Swelling. °? Dizziness. °? Developing a new rash. °When should I call 911 or go to the emergency room? °· You feel dizzy or you faint. °· You are very confused or disoriented. °· You repeatedly vomit. °· Your skin or lips turn pale or bluish in color. °· You have shortness of breath or you are breathing much more slowly than usual. °· You have a severe allergic reaction to your medicine. This includes: °? Developing tongue swelling. °? Having difficulty breathing. °This information is not intended to replace advice given to you by your health care provider. Make sure you discuss any questions you have with your health care provider. °Document Released:  03/08/2001 Document Revised: 06/19/2016 Document Reviewed: 10/04/2014 °Elsevier Interactive Patient Education © 2018 [employer] ° °

## 2018-10-17 ENCOUNTER — Telehealth: Payer: Self-pay | Admitting: *Deleted

## 2018-10-17 NOTE — Telephone Encounter (Signed)
Baxter Hire, [facility] Physical Therapist, is unable to make contact with patient. The phone number listed in chart belongs to his neighbor. Patient is not home when McKittrick visits.  She attempted to contact primary care physician on record, [place], but they have not seen patient in 3+ years and he has not yet contacted them for hospital follow up appointment. Baxter Hire is attempting to report this to any physician that patient is supposed to follow up with. Patient is on oral antibiotic therapy, is scheduled to follow up with [facility] in December for this as well as hepatitis. Andree Coss, RN

## 2018-10-20 ENCOUNTER — Encounter: Payer: Self-pay | Admitting: Gastroenterology

## 2018-11-16 ENCOUNTER — Inpatient Hospital Stay: Payer: Medicaid Other | Admitting: Internal Medicine

## 2019-06-03 ENCOUNTER — Emergency Department (HOSPITAL_COMMUNITY): Payer: Medicaid Other

## 2019-06-03 ENCOUNTER — Inpatient Hospital Stay (HOSPITAL_COMMUNITY)
Admission: EM | Admit: 2019-06-03 | Discharge: 2019-06-18 | DRG: 029 | Discharge status: Against Medical Advice | Payer: Medicaid Other | Attending: Family Medicine | Admitting: Family Medicine

## 2019-06-03 ENCOUNTER — Other Ambulatory Visit: Payer: Self-pay

## 2019-06-03 DIAGNOSIS — R338 Other retention of urine: Secondary | ICD-10-CM

## 2019-06-03 DIAGNOSIS — Z79899 Other long term (current) drug therapy: Secondary | ICD-10-CM

## 2019-06-03 DIAGNOSIS — R059 Cough, unspecified: Secondary | ICD-10-CM

## 2019-06-03 DIAGNOSIS — G834 Cauda equina syndrome: Secondary | ICD-10-CM | POA: Diagnosis present

## 2019-06-03 DIAGNOSIS — G062 Extradural and subdural abscess, unspecified: Secondary | ICD-10-CM

## 2019-06-03 DIAGNOSIS — R7989 Other specified abnormal findings of blood chemistry: Secondary | ICD-10-CM | POA: Diagnosis not present

## 2019-06-03 DIAGNOSIS — M25562 Pain in left knee: Secondary | ICD-10-CM | POA: Diagnosis not present

## 2019-06-03 DIAGNOSIS — M4687 Other specified inflammatory spondylopathies, lumbosacral region: Secondary | ICD-10-CM | POA: Diagnosis present

## 2019-06-03 DIAGNOSIS — G061 Intraspinal abscess and granuloma: Principal | ICD-10-CM | POA: Diagnosis present

## 2019-06-03 DIAGNOSIS — Z716 Tobacco abuse counseling: Secondary | ICD-10-CM

## 2019-06-03 DIAGNOSIS — F17210 Nicotine dependence, cigarettes, uncomplicated: Secondary | ICD-10-CM | POA: Diagnosis present

## 2019-06-03 DIAGNOSIS — E875 Hyperkalemia: Secondary | ICD-10-CM | POA: Diagnosis present

## 2019-06-03 DIAGNOSIS — K7460 Unspecified cirrhosis of liver: Secondary | ICD-10-CM | POA: Diagnosis present

## 2019-06-03 DIAGNOSIS — G4700 Insomnia, unspecified: Secondary | ICD-10-CM | POA: Diagnosis present

## 2019-06-03 DIAGNOSIS — G8929 Other chronic pain: Secondary | ICD-10-CM | POA: Diagnosis present

## 2019-06-03 DIAGNOSIS — Z20828 Contact with and (suspected) exposure to other viral communicable diseases: Secondary | ICD-10-CM | POA: Diagnosis present

## 2019-06-03 DIAGNOSIS — M868X8 Other osteomyelitis, other site: Secondary | ICD-10-CM

## 2019-06-03 DIAGNOSIS — A4902 Methicillin resistant Staphylococcus aureus infection, unspecified site: Secondary | ICD-10-CM

## 2019-06-03 DIAGNOSIS — R339 Retention of urine, unspecified: Secondary | ICD-10-CM | POA: Diagnosis present

## 2019-06-03 DIAGNOSIS — Z5329 Procedure and treatment not carried out because of patient's decision for other reasons: Secondary | ICD-10-CM | POA: Diagnosis not present

## 2019-06-03 DIAGNOSIS — B9562 Methicillin resistant Staphylococcus aureus infection as the cause of diseases classified elsewhere: Secondary | ICD-10-CM | POA: Diagnosis present

## 2019-06-03 DIAGNOSIS — B181 Chronic viral hepatitis B without delta-agent: Secondary | ICD-10-CM | POA: Diagnosis present

## 2019-06-03 DIAGNOSIS — F1410 Cocaine abuse, uncomplicated: Secondary | ICD-10-CM | POA: Diagnosis present

## 2019-06-03 DIAGNOSIS — F1190 Opioid use, unspecified, uncomplicated: Secondary | ICD-10-CM | POA: Diagnosis present

## 2019-06-03 DIAGNOSIS — M25561 Pain in right knee: Secondary | ICD-10-CM | POA: Diagnosis not present

## 2019-06-03 DIAGNOSIS — M869 Osteomyelitis, unspecified: Secondary | ICD-10-CM

## 2019-06-03 DIAGNOSIS — R079 Chest pain, unspecified: Secondary | ICD-10-CM

## 2019-06-03 DIAGNOSIS — B1920 Unspecified viral hepatitis C without hepatic coma: Secondary | ICD-10-CM | POA: Diagnosis present

## 2019-06-03 DIAGNOSIS — R05 Cough: Secondary | ICD-10-CM

## 2019-06-03 DIAGNOSIS — E871 Hypo-osmolality and hyponatremia: Secondary | ICD-10-CM | POA: Diagnosis present

## 2019-06-03 DIAGNOSIS — Z87442 Personal history of urinary calculi: Secondary | ICD-10-CM

## 2019-06-03 DIAGNOSIS — Z5181 Encounter for therapeutic drug level monitoring: Secondary | ICD-10-CM

## 2019-06-03 DIAGNOSIS — M4628 Osteomyelitis of vertebra, sacral and sacrococcygeal region: Secondary | ICD-10-CM | POA: Diagnosis present

## 2019-06-03 DIAGNOSIS — F419 Anxiety disorder, unspecified: Secondary | ICD-10-CM | POA: Diagnosis present

## 2019-06-03 DIAGNOSIS — F329 Major depressive disorder, single episode, unspecified: Secondary | ICD-10-CM | POA: Diagnosis present

## 2019-06-03 DIAGNOSIS — M009 Pyogenic arthritis, unspecified: Secondary | ICD-10-CM

## 2019-06-03 DIAGNOSIS — Z8711 Personal history of peptic ulcer disease: Secondary | ICD-10-CM

## 2019-06-03 LAB — CBC WITH DIFFERENTIAL/PLATELET
Abs Immature Granulocytes: 0.02 10*3/uL (ref 0.00–0.07)
Basophils Absolute: 0 10*3/uL (ref 0.0–0.1)
Basophils Relative: 1 %
Eosinophils Absolute: 0 10*3/uL (ref 0.0–0.5)
Eosinophils Relative: 1 %
HCT: 35.1 % — ABNORMAL LOW (ref 39.0–52.0)
Hemoglobin: 11.4 g/dL — ABNORMAL LOW (ref 13.0–17.0)
Immature Granulocytes: 0 %
Lymphocytes Relative: 17 %
Lymphs Abs: 1.1 10*3/uL (ref 0.7–4.0)
MCH: 28.4 pg (ref 26.0–34.0)
MCHC: 32.5 g/dL (ref 30.0–36.0)
MCV: 87.3 fL (ref 80.0–100.0)
Monocytes Absolute: 0.5 10*3/uL (ref 0.1–1.0)
Monocytes Relative: 8 %
Neutro Abs: 4.7 10*3/uL (ref 1.7–7.7)
Neutrophils Relative %: 73 %
Platelets: 81 10*3/uL — ABNORMAL LOW (ref 150–400)
RBC: 4.02 MIL/uL — ABNORMAL LOW (ref 4.22–5.81)
RDW: 14.1 % (ref 11.5–15.5)
WBC: 6.4 10*3/uL (ref 4.0–10.5)
nRBC: 0 % (ref 0.0–0.2)

## 2019-06-03 LAB — COMPREHENSIVE METABOLIC PANEL
ALT: 26 U/L (ref 0–44)
AST: 25 U/L (ref 15–41)
Albumin: 2.3 g/dL — ABNORMAL LOW (ref 3.5–5.0)
Alkaline Phosphatase: 207 U/L — ABNORMAL HIGH (ref 38–126)
Anion gap: 11 (ref 5–15)
BUN: 18 mg/dL (ref 6–20)
CO2: 24 mmol/L (ref 22–32)
Calcium: 8.1 mg/dL — ABNORMAL LOW (ref 8.9–10.3)
Chloride: 95 mmol/L — ABNORMAL LOW (ref 98–111)
Creatinine, Ser: 0.7 mg/dL (ref 0.61–1.24)
GFR calc Af Amer: >60 mL/min (ref >60)
GFR calc non Af Amer: >60 mL/min (ref >60)
Glucose, Bld: 86 mg/dL (ref 70–99)
Potassium: 3.9 mmol/L (ref 3.5–5.1)
Sodium: 130 mmol/L — ABNORMAL LOW (ref 135–145)
Total Bilirubin: 0.7 mg/dL (ref 0.3–1.2)
Total Protein: 7.4 g/dL (ref 6.5–8.1)

## 2019-06-03 LAB — TROPONIN I: Troponin I: <0.03 ng/mL (ref <0.03)

## 2019-06-03 LAB — URINALYSIS, ROUTINE W REFLEX MICROSCOPIC
Bilirubin Urine: NEGATIVE
Glucose, UA: NEGATIVE mg/dL
Hgb urine dipstick: NEGATIVE
Ketones, ur: NEGATIVE mg/dL
Leukocytes,Ua: NEGATIVE
Nitrite: NEGATIVE
Protein, ur: NEGATIVE mg/dL
Specific Gravity, Urine: 1.015 (ref 1.005–1.030)
pH: 6 (ref 5.0–8.0)

## 2019-06-03 LAB — SARS CORONAVIRUS 2 BY RT PCR (HOSPITAL ORDER, PERFORMED IN ~~LOC~~ HOSPITAL LAB): SARS Coronavirus 2: NEGATIVE

## 2019-06-03 MED ORDER — KETOROLAC TROMETHAMINE 30 MG/ML IJ SOLN
30.0000 mg | Freq: Once | INTRAMUSCULAR | Status: AC
  Administered 2019-06-03: 30 mg via INTRAVENOUS
  Filled 2019-06-03: qty 1

## 2019-06-03 MED ORDER — GADOBUTROL 1 MMOL/ML IV SOLN
6.0000 mL | Freq: Once | INTRAVENOUS | Status: AC | PRN
  Administered 2019-06-03: 6 mL via INTRAVENOUS

## 2019-06-03 MED ORDER — ONDANSETRON HCL 4 MG/2ML IJ SOLN
4.0000 mg | Freq: Once | INTRAMUSCULAR | Status: AC
  Administered 2019-06-03: 4 mg via INTRAVENOUS
  Filled 2019-06-03: qty 2

## 2019-06-03 NOTE — ED Provider Notes (Addendum)
[facility]   CSN: [id number] Arrival date & time: 06/03/19  1447    History   Chief Complaint Chief Complaint  Patient presents with   Chest Pain   Cough    HPI Nuala Alphaony W Powless is a 48 y.o. male.     HPI   Cough started 3 days ago with the chest pains, chest pains slowly getting worse Cough has been worse than cough he has had before.  Has body aches.  Worried about COVID19 Neighbor had been sent home from work because contact at work positive so he was put in quarantine but had seen him prior Feeling hot sometimes and cold flashes, has not checked temperature.  Last night was burning up.  Nonproductive cough, but feels like needs to cough something up Chest pain feels like the rest of the body, aching, throbbing all over, joints and muscles No shortness of breath No abdominal pain, no vomiting Mild nausea Decreased urination, feels like needs too but can't, no hx of problems urinating before No diarrhea Lower back pain near buttock, no loss of control of bowel or bladder, no numbness or weakness  Reports 8 mos without using IVDU No etoh.  Smokes cigarettes No fam hx of early heart disease   Past Medical History:  Diagnosis Date   Anxiety    Chronic lower back pain    Chronic pain    Cirrhosis of liver (HCC)    Depression    Gastric ulceration    Hepatitis B    Hepatitis C    "never tx'd" (09/30/2018)   History of kidney stones    Polysubstance abuse (HCC)    Upper GI bleed     Patient Active Problem List   Diagnosis Date Noted   Cauda equina compression (HCC) 06/04/2019   Malnutrition of moderate degree 10/01/2018   Alcoholic cirrhosis of liver with ascites (HCC) 10/01/2018   Psoas abscess (HCC) 10/01/2018   Discitis of lumbar region 09/30/2018   Discitis of thoracolumbar region 09/30/2018   Chronic pain syndrome 10/31/2016   IV drug abuse (HCC) 10/31/2016   Thrombocytopenia  (HCC) 10/28/2016   Candidemia (HCC)    Chronic gastric ulcer with hemorrhage    Decompensated HCV cirrhosis (HCC)    Chronic giant gastric ulcer    Chronic hepatitis B (HCC)    Chronic hepatitis C without hepatic coma (HCC)    Other cirrhosis of liver (HCC) 07/14/2016   Abdominal pain 07/14/2016    Past Surgical History:  Procedure Laterality Date   BIOPSY  10/10/2018   Procedure: BIOPSY;  Surgeon: Lemar LoftyMansouraty, Gabriel Jr., MD;  [facility];  Service: Gastroenterology;;   CYSTOSCOPY W/ STONE MANIPULATION     ESOPHAGOGASTRODUODENOSCOPY N/A 07/16/2016   Procedure: ESOPHAGOGASTRODUODENOSCOPY (EGD);  Surgeon: Ruffin FrederickSteven Paul Armbruster, MD;  [facility];  Service: Gastroenterology;  Laterality: N/A;   ESOPHAGOGASTRODUODENOSCOPY N/A 10/08/2016   Procedure: ESOPHAGOGASTRODUODENOSCOPY (EGD);  Surgeon: Sherrilyn RistHenry L Danis III, MD;  [facility];  Service: Endoscopy;  Laterality: N/A;   ESOPHAGOGASTRODUODENOSCOPY (EGD) WITH PROPOFOL N/A 10/10/2018   Procedure: ESOPHAGOGASTRODUODENOSCOPY (EGD) WITH PROPOFOL;  Surgeon: Meridee ScoreMansouraty, Netty StarringGabriel Jr., MD;  [facility];  Service: Gastroenterology;  Laterality: N/A;   IR LUMBAR DISC ASPIRATION W/IMG GUIDE  10/01/2018   LUNG LOBECTOMY Right    TEE WITHOUT CARDIOVERSION N/A 10/30/2016   Procedure: TRANSESOPHAGEAL ECHOCARDIOGRAM (TEE);  Surgeon: Pricilla RifflePaula V Ross, MD;  [facility];  Service: Cardiovascular;  Laterality: N/A;   TEE WITHOUT CARDIOVERSION  10/10/2018   Procedure: TRANSESOPHAGEAL ECHOCARDIOGRAM (TEE);  Surgeon: Lemar LoftyMansouraty, Gabriel Jr., MD;  [facility];  Service: Gastroenterology;;        Home Medications    Prior to Admission medications   Medication Sig Start Date End Date Taking? Authorizing Provider  feeding supplement, ENSURE ENLIVE, (ENSURE ENLIVE) LIQD Take 237 mLs by mouth 3 (three) times daily between meals. 10/12/18   Hongalgi, Maximino GreenlandAnand D, MD  Multiple Vitamin  (MULTIVITAMIN WITH MINERALS) TABS tablet Take 1 tablet by mouth daily. 10/13/18   Hongalgi, Maximino GreenlandAnand D, MD  oxyCODONE 10 MG TABS Take 1-2 tablets (10-20 mg total) by mouth every 6 (six) hours as needed for moderate pain or severe pain. 10/12/18   Hongalgi, Maximino GreenlandAnand D, MD  oxyCODONE 30 MG 12 hr tablet Take 1 tablet (30 mg total) by mouth every 12 (twelve) hours. 10/12/18   Hongalgi, Maximino GreenlandAnand D, MD  pantoprazole (PROTONIX) 40 MG tablet Take 1 tablet (40 mg total) by mouth daily. 10/12/18 11/11/18  Hongalgi, Maximino GreenlandAnand D, MD  polyethylene glycol (MIRALAX / GLYCOLAX) packet Take 17 g by mouth daily. 10/13/18   Hongalgi, Maximino GreenlandAnand D, MD  senna-docusate (SENOKOT-S) 8.6-50 MG tablet Take 1 tablet by mouth 2 (two) times daily. 10/12/18   Hongalgi, Maximino GreenlandAnand D, MD  Tenofovir Alafenamide Fumarate (VEMLIDY) 25 MG TABS Take 1 tablet (25 mg total) by mouth daily. 10/06/18   Jerald Kiefhiu, Stephen K, MD    Family History Family History  Problem Relation Age of Onset   Dementia Mother     Social History Social History   Tobacco Use   Smoking status: Current Every Day Smoker    Packs/day: 1.00    Years: 36.00    Pack years: 36.00    Types: Cigarettes   Smokeless tobacco: Never Used  Substance Use Topics   Alcohol use: No   Drug use: Yes    Types: Heroin    Comment: 09/30/2018 "qd"     Allergies   Patient has no known allergies.   Review of Systems Review of Systems  Constitutional: Positive for appetite change, chills and fever (subjective).  HENT: Negative for sore throat.   Eyes: Negative for visual disturbance.  Respiratory: Positive for cough and shortness of breath.   Cardiovascular: Positive for chest pain.  Gastrointestinal: Negative for abdominal pain, nausea and vomiting.  Genitourinary: Positive for difficulty urinating.  Musculoskeletal: Positive for arthralgias, back pain and myalgias.  Skin: Negative for rash.  Neurological: Negative for syncope, weakness, numbness and headaches.     Physical  Exam Updated Vital Signs BP 108/70    Pulse 70    Temp 98 F (36.7 C) (Oral)    Resp 15    Ht 5\' 11"  (1.803 m)    Wt 68 kg    SpO2 99%    BMI 20.92 kg/m   Physical Exam Vitals signs and nursing note reviewed.  Constitutional:      General: He is not in acute distress.    Appearance: He is well-developed. He is not diaphoretic.  HENT:     Head: Normocephalic and atraumatic.  Eyes:     Conjunctiva/sclera: Conjunctivae normal.  Neck:     Musculoskeletal: Normal range of motion.  Cardiovascular:     Rate and Rhythm: Normal rate and regular rhythm.     Heart sounds: Normal heart sounds. No murmur. No friction rub. No gallop.   Pulmonary:     Effort: Pulmonary effort is normal. No respiratory distress.     Breath sounds: Normal  breath sounds. No wheezing or rales.  Abdominal:     General: There is no distension.     Palpations: Abdomen is soft.     Tenderness: There is no abdominal tenderness. There is no guarding.  Skin:    General: Skin is warm and dry.  Neurological:     Mental Status: He is alert and oriented to person, place, and time.     GCS: GCS eye subscore is 4. GCS verbal subscore is 5. GCS motor subscore is 6.     Sensory: Sensation is intact.     Motor: Motor function is intact.     Comments: 5/5 LE strength, normal sensation      ED Treatments / Results  Labs (all labs ordered are listed, but only abnormal results are displayed) Labs Reviewed  CBC WITH DIFFERENTIAL/PLATELET - Abnormal; Notable for the following components:      Result Value   RBC 4.02 (*)    Hemoglobin 11.4 (*)    HCT 35.1 (*)    Platelets 81 (*)    All other components within normal limits  COMPREHENSIVE METABOLIC PANEL - Abnormal; Notable for the following components:   Sodium 130 (*)    Chloride 95 (*)    Calcium 8.1 (*)    Albumin 2.3 (*)    Alkaline Phosphatase 207 (*)    All other components within normal limits  URINALYSIS, ROUTINE W REFLEX MICROSCOPIC - Abnormal; Notable for  the following components:   Color, Urine AMBER (*)    All other components within normal limits  SEDIMENTATION RATE - Abnormal; Notable for the following components:   Sed Rate 79 (*)    All other components within normal limits  SARS CORONAVIRUS 2 (HOSPITAL ORDER, [place] [facility])  CULTURE, BLOOD (ROUTINE X 2)  CULTURE, BLOOD (ROUTINE X 2)  FUNGUS CULTURE, BLOOD  TROPONIN I  C-REACTIVE PROTEIN  RAPID URINE DRUG SCREEN, HOSP PERFORMED  CBC    EKG EKG Interpretation  Date/Time:  Saturday June 03 2019 14:51:16 EDT Ventricular Rate:  75 PR Interval:    QRS Duration: 91 QT Interval:  376 QTC Calculation: 420 R Axis:   86 Text Interpretation:  Sinus rhythm Borderline short PR interval No significant change since last tracing Confirmed by Gareth Morgan [phone]) on 06/03/2019 4:52:33 PM   Radiology Mr Lumbar Spine W Wo Contrast  Result Date: 06/03/2019 CLINICAL DATA:  Back pain, cauda equina syndrome suspected. EXAM: MRI LUMBAR SPINE WITHOUT AND WITH CONTRAST TECHNIQUE: Multiplanar and multiecho pulse sequences of the lumbar spine were obtained without and with intravenous contrast. CONTRAST:  Gadavist 6 mL. COMPARISON:  09/30/2018 FINDINGS: Segmentation:  Standard. Alignment: Straightening, and reversal, of the normal lumbar lordosis. Vertebrae: T12-L1 diskitis with regional osteomyelitis as well as L3-L4 diskitis with regional osteomyelitis has healed. However, there is abnormal bone marrow edema at L5 and S1 on the RIGHT extending into the RIGHT sacral ala and SI joint. Conus medullaris and cauda equina: Conus extends to the L1 level level. Conus appears normal. There is relative stenosis at L3-4 due to chronic L3 retropulsion. Below this, there are space-occupying fluid collections in the epidural space, RIGHT greater than LEFT extending into the sacral canal consistent with epidural abscesses. The show peripheral enhancement. See series 11, image 30 where a  RIGHT-sided collection measures 8 x 17 mm cross-section. A LEFT-sided collection at this level is slightly smaller. Paraspinal and other soft tissues: Paravertebral abscesses are seen dorsal to the RIGHT L5-S1 facet,  roughly 1 cm in size, and are indicative of septic facet arthritis. Disc levels: L1-L2: Central and leftward protrusion. Mild stenosis but no impingement. L2-L3:  Unremarkable. L3-L4: Healed discitis and osteomyelitis. L3 retropulsion. Moderate stenosis. BILATERAL subarticular zone narrowing. L4-L5: Disc space narrowing. Central protrusion. Posterior element hypertrophy. Moderate stenosis. BILATERAL L5 neural impingement likely. L5-S1: Advanced disc space narrowing. Central protrusion. Trefoil appearance to the thecal sac due to BILATERAL epidural abscesses, severe compression. Significant subarticular zone and foraminal zone narrowing affecting the L5 and S1 nerve roots. Abnormal edema of L5 on the RIGHT and S1 on the RIGHT reflecting septic facet joint and septic sacroiliac joint. Enhancing abscesses extend into the sacral canal, greater on the RIGHT. IMPRESSION: T12-L1 and L3-4 discitis with regional osteomyelitis has essentially resolved. Moderate stenosis at L3-4 related to retropulsion of L3. The patient has developed septic arthritis of the RIGHT L5-S1 facet joint, and septic arthritis of the RIGHT sacroiliac joint. Osteomyelitis of the RIGHT sacrum and RIGHT L5 pedicle are noted. Epidural abscesses in the lower lumbar and upper sacral canal, resulting in severe compression of the thecal sac and cauda equina nerve roots. Depending on the patient's neurologic status, neurosurgical consultation may be warranted. These results were called by telephone at the time of interpretation on 06/03/2019 at 9:36 pm to Dr. Alvira Monday , who verbally acknowledged these results. Electronically Signed   By: Elsie Stain M.D.   On: 06/03/2019 21:41   Dg Chest Portable 1 View  Result Date:  06/03/2019 CLINICAL DATA:  Chest pain and cough EXAM: PORTABLE CHEST 1 VIEW COMPARISON:  09/29/2018 FINDINGS: Bulla and scarring in the right lung apex. No acute consolidation or effusion. Normal cardiomediastinal silhouette. No pneumothorax. IMPRESSION: No active disease.  Stable scarring in the right lung apex. Electronically Signed   By: Jasmine Pang M.D.   On: 06/03/2019 16:30    Procedures .Critical Care Performed by: Alvira Monday, MD Authorized by: Alvira Monday, MD   Critical care provider statement:    Critical care time (minutes):  45   Critical care was time spent personally by me on the following activities:  Discussions with consultants, evaluation of patient's response to treatment, examination of patient, ordering and performing treatments and interventions, ordering and review of laboratory studies, ordering and review of radiographic studies, pulse oximetry, re-evaluation of patient's condition, obtaining history from patient or surrogate and review of old charts   (including critical care time)  Medications Ordered in ED Medications  acetaminophen (TYLENOL) tablet 650 mg (has no administration in time range)    Or  acetaminophen (TYLENOL) suppository 650 mg (has no administration in time range)  nicotine (NICODERM CQ - dosed in mg/24 hours) patch 21 mg (has no administration in time range)  morphine 2 MG/ML injection 1 mg (has no administration in time range)  0.9 %  sodium chloride infusion (has no administration in time range)  vancomycin (VANCOCIN) IVPB 1000 mg/200 mL premix (has no administration in time range)  cefTRIAXone (ROCEPHIN) 2 g in sodium chloride 0.9 % 100 mL IVPB (has no administration in time range)  ketorolac (TORADOL) 30 MG/ML injection 30 mg (30 mg Intravenous Given 06/03/19 1600)  ondansetron (ZOFRAN) injection 4 mg (4 mg Intravenous Given 06/03/19 1600)  gadobutrol (GADAVIST) 1 MMOL/ML injection 6 mL (6 mLs Intravenous Contrast Given 06/03/19  2114)  propofol (DIPRIVAN) 10 mg/mL bolus/IV push (has no administration in time range)  midazolam (VERSED) 2 MG/2ML injection (has no administration in time range)  SUFentanil (SUFENTA) 50 MCG/ML  injection (has no administration in time range)  ketamine HCl 50 MG/5ML SOSY (has no administration in time range)     Initial Impression / Assessment and Plan / ED Course  I have reviewed the triage vital signs and the nursing notes.  Pertinent labs & imaging results that were available during my care of the patient were reviewed by me and considered in my medical decision making (see chart for details).        48 year old male with history of cirrhosis, IV drug abuse with history of hepatitis C, hepatitis B, discitis of the lumbar region, psoas abscess in October 2019, who presents with concern for cough, chest pain and body aches.  EKG shows no findings to suggest ST elevations or pericarditis.  X-ray shows no evidence of pneumonia, pneumothorax, or pulmonary edema.  Labs show no significant findings  Troponin negative, given constellation of symptoms, doubt ACS.  While patient reports he has not used IV drugs for 7 or 8 months, given combination of symptoms described including chills and body aches, ordered blood cultures.  He is afebrile here in the emergency department, and do not feel that empiric antibiotic therapy is indicated given his vital signs and lack of temperature.  Describes symptoms which may be consistent with COVID19, test ordered and pending.   COVID19 negative.  Reported difficulty urinating for 2 days, postvoid residual with greater than 200cc in bladder and foley cathter placed with 600cc of urine. Ordered MRI WWO contrast for eval for epidural abscess/discitis given hx of infectious symptoms, urinary retention, back pain and prior hx of disciitis/IVDU.  MR shows osteomyelitis, septic arthritis of SI joint and facet, and epidural abscesses. Discussed with Dr. Jordan LikesPool of [facility].  Will  hold abx until sampling given pt hemodynamically stable.  Discussed with hospitalist and Dr. Dion SaucierLandau of Orthopedics. Given location of osteomyelitis is extension of spinal disease, do not feel there will be additional benefit of orthopedics unless requested by [facility].  Dr. Jordan LikesPool to come to bedside for evaluation and pt to be admitted to hospital.    Final Clinical Impressions(s) / ED Diagnoses   Final diagnoses:  Cough  Chest pain, unspecified type  Epidural abscess  Other osteomyelitis, other site [facility](HCC)  Pyogenic arthritis of multiple sites, due to unspecified organism [facility](HCC)    ED Discharge Orders    None       Alvira MondaySchlossman, Edmundo Tedesco, MD 06/04/19 [id number]    Alvira MondaySchlossman, Caeley Dohrmann, MD 06/04/19 [id number]

## 2019-06-03 NOTE — ED Notes (Signed)
Patient transported to MRI 

## 2019-06-03 NOTE — H&P (Signed)
History and Physical    Dan [name] SPQ:[id number] DOB: [birth year] DOA: 06/03/2019  PCP: Imagene Riches, NP Patient coming from: Home  Chief Complaint: Generalized body aches  HPI: Dan [name] is a 48 y.o. male with medical history significant of anxiety, chronic low back pain, liver cirrhosis, depression, hep B, hep C, polysubstance abuse presenting with a chief complaint of generalized body aches.  Patient states he has been having generalized body aches for the past 5 to 6 days.  States he has chronic back pain but it has been much worse for the past few days.  He thinks he may be having fevers but has not checked his temperature.  Denies any lower extremity weakness or numbness.  States he has not urinated for the past 2 days and has not had a bowel movement for the past 3 days.  Denies any chest pain, shortness of breath, cough, nausea, vomiting, or abdominal pain.  States he has not used IV drugs for the past 8 months.  ED Course: Afebrile and hemodynamically stable.  No leukocytosis.  Hemoglobin 11.4, at baseline.  Platelet count 81,000, chronically low.  Sodium 130.  Alkaline phosphatase 207, remainder of LFTs normal.  Troponin negative.  EKG not suggestive of ACS.  UA not suggestive of infection.  Blood culture x2 pending.  ESR and CRP pending.  COVID-19 rapid test negative. Chest x-ray showing no active disease, stable scarring in the right lung apex.  MRI of lumbar spine showing resolution of T12-L1 and L3-4 discitis with regional osteomyelitis.  Moderate stenosis at L3-4 related to retropulsion of L3.  MRI showing new finding of septic arthritis of the right L5-S1 facet joint and septic arthritis of the right sacroiliac joint.  Also showing osteomyelitis of the right sacrum and right L5 pedicle.  In addition, there are epidural abscesses in the lower lumbar and upper sacral canal, resulting in severe compression of the thecal sac and cauda equina nerve roots. Patient received Zofran and  Toradol in the ED.  Review of Systems:  All systems reviewed and apart from history of presenting illness, are negative.  Past Medical History:  Diagnosis Date   Anxiety    Chronic lower back pain    Chronic pain    Cirrhosis of liver ([place])    Depression    Gastric ulceration    Hepatitis B    Hepatitis C    "never tx'd" (09/30/2018)   History of kidney stones    Polysubstance abuse ([place])    Upper GI bleed     Past Surgical History:  Procedure Laterality Date   BIOPSY  10/10/2018   Procedure: BIOPSY;  Surgeon: Irving Copas., MD;  Location: [place] [facility];  Service: Gastroenterology;;   CYSTOSCOPY W/ STONE MANIPULATION     ESOPHAGOGASTRODUODENOSCOPY N/A 07/16/2016   Procedure: ESOPHAGOGASTRODUODENOSCOPY (EGD);  Surgeon: Manus Gunning, MD;  Location: [facility];  Service: Gastroenterology;  Laterality: N/A;   ESOPHAGOGASTRODUODENOSCOPY N/A 10/08/2016   Procedure: ESOPHAGOGASTRODUODENOSCOPY (EGD);  Surgeon: Doran Stabler, MD;  Location[facility] END[facility]COPY;  Service: Endoscopy;  Laterality: N/A;   ESOPHAGOGASTRODUODENOSCOPY (EGD) WITH PROPOFOL N/A 10/10/2018   Procedure: ESOPHAGOGASTRODUODENOSCOPY (EGD) WITH PROPOFOL;  Surgeon: Rush Landmark Telford Nab., MD;  Location: [place];  Service: Gastroenterology;  Laterality: N/A;   IR LUMBAR DISC ASPIRATION W/IMG GUIDE  10/01/2018   LUNG LOBECTOMY Right    TEE WITHOUT CARDIOVERSION N/A 10/30/2016   Procedure: TRANSESOPHAGEAL ECHOCARDIOGRAM (TEE);  Surgeon: Fay Records, MD;  Location: [facility] ENDOSCOPY;  Service: Cardiovascular;  Laterality: N/A;   TEE WITHOUT CARDIOVERSION  10/10/2018   Procedure: TRANSESOPHAGEAL ECHOCARDIOGRAM (TEE);  Surgeon: Rush Landmark Telford Nab., MD;  Location: [place];  Service: Gastroenterology;;     reports that he has been smoking cigarettes. He has a 36.00 pack-year smoking history. He has never used smokeless tobacco. He reports current drug use. Drug: Heroin. He reports  that he does not drink alcohol.  No Known Allergies  Family History  Problem Relation Age of Onset   Dementia Mother     Prior to Admission medications   Medication Sig Start Date End Date Taking? Authorizing Provider  feeding supplement, ENSURE ENLIVE, (ENSURE ENLIVE) LIQD Take 237 mLs by mouth 3 (three) times daily between meals. 10/12/18   Hongalgi, Lenis Dickinson, MD  Multiple Vitamin (MULTIVITAMIN WITH MINERALS) TABS tablet Take 1 tablet by mouth daily. 10/13/18   Hongalgi, Lenis Dickinson, MD  oxyCODONE 10 MG TABS Take 1-2 tablets (10-20 mg total) by mouth every 6 (six) hours as needed for moderate pain or severe pain. 10/12/18   Hongalgi, Lenis Dickinson, MD  oxyCODONE 30 MG 12 hr tablet Take 1 tablet (30 mg total) by mouth every 12 (twelve) hours. 10/12/18   Hongalgi, Lenis Dickinson, MD  pantoprazole (PROTONIX) 40 MG tablet Take 1 tablet (40 mg total) by mouth daily. 10/12/18 11/11/18  Hongalgi, Lenis Dickinson, MD  polyethylene glycol (MIRALAX / GLYCOLAX) packet Take 17 g by mouth daily. 10/13/18   Hongalgi, Lenis Dickinson, MD  senna-docusate (SENOKOT-S) 8.6-50 MG tablet Take 1 tablet by mouth 2 (two) times daily. 10/12/18   Hongalgi, Lenis Dickinson, MD  Tenofovir Alafenamide Fumarate (VEMLIDY) 25 MG TABS Take 1 tablet (25 mg total) by mouth daily. 10/06/18   Donne Hazel, MD    Physical Exam: Vitals:   06/04/19 0344 06/04/19 0345 06/04/19 0354 06/04/19 0421  BP:  116/78 116/76 116/77  Pulse: 73 79 83 72  Resp: 19 20 (!) 21 19  Temp:   (!) 97.5 F (36.4 C) 98.2 F (36.8 C)  TempSrc:    Oral  SpO2: 100% 100% 100% 98%  Weight:      Height:        Physical Exam  Constitutional: He is oriented to person, place, and time. He appears well-developed and well-nourished.  Distress secondary to back pain  HENT:  Head: Normocephalic.  Mouth/Throat: Oropharynx is clear and moist.  Eyes: EOM are normal. Right eye exhibits no discharge. Left eye exhibits no discharge.  Neck: Neck supple.  Cardiovascular: Normal rate, regular  rhythm and intact distal pulses.  Pulmonary/Chest: Effort normal and breath sounds normal. No respiratory distress. He has no wheezes. He has no rales.  Abdominal: Soft. Bowel sounds are normal. He exhibits no distension. There is no abdominal tenderness. There is no guarding.  Musculoskeletal:        General: No edema.  Neurological: He is alert and oriented to person, place, and time.  Strength 5 out of 5 in bilateral lower extremities.  Sensation to light touch intact in bilateral lower extremities.  Skin: Skin is warm and dry. He is not diaphoretic.     Labs on Admission: I have personally reviewed following labs and imaging studies  CBC: Recent Labs  Lab 06/03/19 1600  WBC 6.4  NEUTROABS 4.7  HGB 11.4*  HCT 35.1*  MCV 87.3  PLT 81*   Basic Metabolic Panel: Recent Labs  Lab 06/03/19 1600  NA 130*  K 3.9  CL 95*  CO2 24  GLUCOSE 86  BUN 18  CREATININE 0.70  CALCIUM 8.1*   GFR: Estimated Creatinine Clearance: 107.4 mL/min (by C-G formula based on SCr of 0.7 mg/dL). Liver Function Tests: Recent Labs  Lab 06/03/19 1600  AST 25  ALT 26  ALKPHOS 207*  BILITOT 0.7  PROT 7.4  ALBUMIN 2.3*   No results for input(s): LIPASE, AMYLASE in the last 168 hours. No results for input(s): AMMONIA in the last 168 hours. Coagulation Profile: No results for input(s): INR, PROTIME in the last 168 hours. Cardiac Enzymes: Recent Labs  Lab 06/03/19 1600  TROPONINI <0.03   BNP (last 3 results) No results for input(s): PROBNP in the last 8760 hours. HbA1C: No results for input(s): HGBA1C in the last 72 hours. CBG: No results for input(s): GLUCAP in the last 168 hours. Lipid Profile: No results for input(s): CHOL, HDL, LDLCALC, TRIG, CHOLHDL, LDLDIRECT in the last 72 hours. Thyroid Function Tests: No results for input(s): TSH, T4TOTAL, FREET4, T3FREE, THYROIDAB in the last 72 hours. Anemia Panel: No results for input(s): VITAMINB12, FOLATE, FERRITIN, TIBC, IRON,  RETICCTPCT in the last 72 hours. Urine analysis:    Component Value Date/Time   COLORURINE AMBER (A) 06/03/2019 1840   APPEARANCEUR CLEAR 06/03/2019 1840   LABSPEC 1.015 06/03/2019 1840   PHURINE 6.0 06/03/2019 1840   GLUCOSEU NEGATIVE 06/03/2019 1840   HGBUR NEGATIVE 06/03/2019 1840   BILIRUBINUR NEGATIVE 06/03/2019 [place] 06/03/2019 1840   PROTEINUR NEGATIVE 06/03/2019 1840   NITRITE NEGATIVE 06/03/2019 1840   LEUKOCYTESUR NEGATIVE 06/03/2019 1840    Radiological Exams on Admission: Mr Lumbar Spine W Wo Contrast  Result Date: 06/03/2019 CLINICAL DATA:  Back pain, cauda equina syndrome suspected. EXAM: MRI LUMBAR SPINE WITHOUT AND WITH CONTRAST TECHNIQUE: Multiplanar and multiecho pulse sequences of the lumbar spine were obtained without and with intravenous contrast. CONTRAST:  Gadavist 6 mL. COMPARISON:  09/30/2018 FINDINGS: Segmentation:  Standard. Alignment: Straightening, and reversal, of the normal lumbar lordosis. Vertebrae: T12-L1 diskitis with regional osteomyelitis as well as L3-L4 diskitis with regional osteomyelitis has healed. However, there is abnormal bone marrow edema at L5 and S1 on the RIGHT extending into the RIGHT sacral ala and SI joint. Conus medullaris and cauda equina: Conus extends to the L1 level level. Conus appears normal. There is relative stenosis at L3-4 due to chronic L3 retropulsion. Below this, there are space-occupying fluid collections in the epidural space, RIGHT greater than LEFT extending into the sacral canal consistent with epidural abscesses. The show peripheral enhancement. See series 11, image 30 where a RIGHT-sided collection measures 8 x 17 mm cross-section. A LEFT-sided collection at this level is slightly smaller. Paraspinal and other soft tissues: Paravertebral abscesses are seen dorsal to the RIGHT L5-S1 facet, roughly 1 cm in size, and are indicative of septic facet arthritis. Disc levels: L1-L2: Central and leftward  protrusion. Mild stenosis but no impingement. L2-L3:  Unremarkable. L3-L4: Healed discitis and osteomyelitis. L3 retropulsion. Moderate stenosis. BILATERAL subarticular zone narrowing. L4-L5: Disc space narrowing. Central protrusion. Posterior element hypertrophy. Moderate stenosis. BILATERAL L5 neural impingement likely. L5-S1: Advanced disc space narrowing. Central protrusion. Trefoil appearance to the thecal sac due to BILATERAL epidural abscesses, severe compression. Significant subarticular zone and foraminal zone narrowing affecting the L5 and S1 nerve roots. Abnormal edema of L5 on the RIGHT and S1 on the RIGHT reflecting septic facet joint and septic sacroiliac joint. Enhancing abscesses extend into the sacral canal, greater on the RIGHT. IMPRESSION: T12-L1 and L3-4 discitis with regional  osteomyelitis has essentially resolved. Moderate stenosis at L3-4 related to retropulsion of L3. The patient has developed septic arthritis of the RIGHT L5-S1 facet joint, and septic arthritis of the RIGHT sacroiliac joint. Osteomyelitis of the RIGHT sacrum and RIGHT L5 pedicle are noted. Epidural abscesses in the lower lumbar and upper sacral canal, resulting in severe compression of the thecal sac and cauda equina nerve roots. Depending on the patient's neurologic status, neurosurgical consultation may be warranted. These results were called by telephone at the time of interpretation on 06/03/2019 at 9:36 pm to Dr. Gareth Morgan , who verbally acknowledged these results. Electronically Signed   By: Staci Righter M.D.   On: 06/03/2019 21:41   Dg Chest Portable 1 View  Result Date: 06/03/2019 CLINICAL DATA:  Chest pain and cough EXAM: PORTABLE CHEST 1 VIEW COMPARISON:  09/29/2018 FINDINGS: [place] and scarring in the right lung apex. No acute consolidation or effusion. Normal cardiomediastinal silhouette. No pneumothorax. IMPRESSION: No active disease.  Stable scarring in the right lung apex. Electronically Signed   By:  Donavan Foil M.D.   On: 06/03/2019 16:30    EKG: Independently reviewed.  Sinus rhythm, borderline shortened PR interval.  Assessment/Plan Principal Problem:   Cauda equina compression (HCC) Active Problems:   Acute urinary retention   Epidural abscess   Septic arthritis (HCC)   Osteomyelitis (HCC)   Lumbar epidural abscess with severe cauda equina compression and acute urinary retention Afebrile and no leukocytosis. No lower extremity weakness or paresthesias.  -Frequent neurochecks -Foley catheter placed -Patient was seen by neurosurgery and underwent emergent decompression with evacuation of epidural abscess. -Intraoperative cultures taken -Started on ceftriaxone and vancomycin -Pain management postop: Dilaudid PRN severe pain, OxyIR PRN, OxyContin and Toradol scheduled.  Reassess in a.m. and de-escalate if appropriate. -Continuous pulse ox -Blood culture x2 pending -Fungal culture pending  Septic arthritis of the right L5-S1 facet joint and right SI joint, osteomyelitis of the right sacrum and right L5 pedicle Afebrile and no leukocytosis.  Hemodynamically stable.  ESR and CRP elevated. -Ceftriaxone and vancomycin. -Blood culture x2 pending -Fungal culture pending -ID will consult in a.m.  Mild hyponatremia Sodium 130.   -IV fluid hydration -Continue to monitor BMP  Elevated alkaline phosphatase Alkaline phosphatase 207.  Likely secondary to bone pathology/osteomyelitis.  History of polysubstance abuse -Check UDS  Tobacco use -Counseled to quit -NicoDerm patch  DVT prophylaxis: SCDs at least 24 hours postop as recommended by neurosurgery Code Status: Full code Family Communication: No family available. Disposition Plan: Anticipate discharge after clinical improvement. Consults called: Neurosurgery (Dr. Trenton Gammon), infectious disease (Dr. Graylon Good) Admission status: It is my clinical opinion that admission to [facility] is reasonable and necessary in this 48 y.o.  male  presenting with spinal epidural abscess with severe cauda equina compression and acute urinary retention.  In addition, has septic arthritis of the right L5-S1 facet joint and right SI joint, osteomyelitis of the right sacrum and right L5 pedicle.  Underwent emergent decompression with evacuation of epidural abscess.  Patient will be seen by infectious disease in the morning.  Anticipate prolonged hospitalization for IV antibiotic course.  Given the aforementioned, the predictability of an adverse outcome is felt to be significant. I expect that the patient will require at least 2 midnights in the hospital to treat this condition.   The medical decision making on this patient was of high complexity and the patient is at high risk for clinical deterioration, therefore this is a level 3 visit.  Shela Leff  MD [facility] Pager [phone]  If 7PM-7AM, please contact night-coverage [email or web address] Password [facility]  06/04/2019, 5:45 AM

## 2019-06-03 NOTE — ED Triage Notes (Signed)
Pt here for evaluation of generalized throbbing/aching chest pain, worse on the L side, and worse with breathing, coughing and palpation. Non productive cough, generalized body aches, and weakness x 3 days. Denies sick contacts. Pt took 650 ASA prior to EMS arrival. Per EMS bed bugs present.

## 2019-06-04 ENCOUNTER — Inpatient Hospital Stay (HOSPITAL_COMMUNITY): Payer: Medicaid Other | Admitting: Certified Registered"

## 2019-06-04 ENCOUNTER — Encounter (HOSPITAL_COMMUNITY)
Admission: EM | Discharge status: Against Medical Advice | Payer: Self-pay | Source: Home / Self Care | Attending: Family Medicine

## 2019-06-04 ENCOUNTER — Encounter (HOSPITAL_COMMUNITY): Payer: Self-pay | Admitting: Certified Registered"

## 2019-06-04 DIAGNOSIS — F1410 Cocaine abuse, uncomplicated: Secondary | ICD-10-CM | POA: Diagnosis present

## 2019-06-04 DIAGNOSIS — F419 Anxiety disorder, unspecified: Secondary | ICD-10-CM | POA: Diagnosis present

## 2019-06-04 DIAGNOSIS — G834 Cauda equina syndrome: Secondary | ICD-10-CM | POA: Diagnosis present

## 2019-06-04 DIAGNOSIS — M869 Osteomyelitis, unspecified: Secondary | ICD-10-CM

## 2019-06-04 DIAGNOSIS — Z20828 Contact with and (suspected) exposure to other viral communicable diseases: Secondary | ICD-10-CM | POA: Diagnosis present

## 2019-06-04 DIAGNOSIS — M4687 Other specified inflammatory spondylopathies, lumbosacral region: Secondary | ICD-10-CM | POA: Diagnosis present

## 2019-06-04 DIAGNOSIS — Z5181 Encounter for therapeutic drug level monitoring: Secondary | ICD-10-CM | POA: Diagnosis not present

## 2019-06-04 DIAGNOSIS — E871 Hypo-osmolality and hyponatremia: Secondary | ICD-10-CM | POA: Diagnosis present

## 2019-06-04 DIAGNOSIS — Z79899 Other long term (current) drug therapy: Secondary | ICD-10-CM | POA: Diagnosis not present

## 2019-06-04 DIAGNOSIS — R338 Other retention of urine: Secondary | ICD-10-CM

## 2019-06-04 DIAGNOSIS — F17210 Nicotine dependence, cigarettes, uncomplicated: Secondary | ICD-10-CM | POA: Diagnosis present

## 2019-06-04 DIAGNOSIS — M4645 Discitis, unspecified, thoracolumbar region: Secondary | ICD-10-CM | POA: Diagnosis not present

## 2019-06-04 DIAGNOSIS — Z87442 Personal history of urinary calculi: Secondary | ICD-10-CM | POA: Diagnosis not present

## 2019-06-04 DIAGNOSIS — G4700 Insomnia, unspecified: Secondary | ICD-10-CM | POA: Diagnosis present

## 2019-06-04 DIAGNOSIS — G061 Intraspinal abscess and granuloma: Secondary | ICD-10-CM | POA: Diagnosis present

## 2019-06-04 DIAGNOSIS — G062 Extradural and subdural abscess, unspecified: Secondary | ICD-10-CM | POA: Diagnosis not present

## 2019-06-04 DIAGNOSIS — R339 Retention of urine, unspecified: Secondary | ICD-10-CM | POA: Diagnosis present

## 2019-06-04 DIAGNOSIS — B1920 Unspecified viral hepatitis C without hepatic coma: Secondary | ICD-10-CM | POA: Diagnosis present

## 2019-06-04 DIAGNOSIS — R05 Cough: Secondary | ICD-10-CM | POA: Diagnosis present

## 2019-06-04 DIAGNOSIS — E875 Hyperkalemia: Secondary | ICD-10-CM | POA: Diagnosis present

## 2019-06-04 DIAGNOSIS — M009 Pyogenic arthritis, unspecified: Secondary | ICD-10-CM

## 2019-06-04 DIAGNOSIS — F1190 Opioid use, unspecified, uncomplicated: Secondary | ICD-10-CM | POA: Diagnosis present

## 2019-06-04 DIAGNOSIS — K7460 Unspecified cirrhosis of liver: Secondary | ICD-10-CM | POA: Diagnosis present

## 2019-06-04 DIAGNOSIS — F329 Major depressive disorder, single episode, unspecified: Secondary | ICD-10-CM | POA: Diagnosis present

## 2019-06-04 DIAGNOSIS — Z5329 Procedure and treatment not carried out because of patient's decision for other reasons: Secondary | ICD-10-CM | POA: Diagnosis not present

## 2019-06-04 DIAGNOSIS — M4627 Osteomyelitis of vertebra, lumbosacral region: Secondary | ICD-10-CM | POA: Diagnosis not present

## 2019-06-04 DIAGNOSIS — B181 Chronic viral hepatitis B without delta-agent: Secondary | ICD-10-CM | POA: Diagnosis present

## 2019-06-04 DIAGNOSIS — M4628 Osteomyelitis of vertebra, sacral and sacrococcygeal region: Secondary | ICD-10-CM | POA: Diagnosis present

## 2019-06-04 DIAGNOSIS — Z716 Tobacco abuse counseling: Secondary | ICD-10-CM | POA: Diagnosis not present

## 2019-06-04 DIAGNOSIS — B9562 Methicillin resistant Staphylococcus aureus infection as the cause of diseases classified elsewhere: Secondary | ICD-10-CM | POA: Diagnosis present

## 2019-06-04 DIAGNOSIS — G8929 Other chronic pain: Secondary | ICD-10-CM | POA: Diagnosis present

## 2019-06-04 HISTORY — PX: LUMBAR LAMINECTOMY FOR EPIDURAL ABSCESS: SHX5956

## 2019-06-04 LAB — CBC
HCT: 36.2 % — ABNORMAL LOW (ref 39.0–52.0)
Hemoglobin: 11.9 g/dL — ABNORMAL LOW (ref 13.0–17.0)
MCH: 28.1 pg (ref 26.0–34.0)
MCHC: 32.9 g/dL (ref 30.0–36.0)
MCV: 85.6 fL (ref 80.0–100.0)
Platelets: 71 10*3/uL — ABNORMAL LOW (ref 150–400)
RBC: 4.23 MIL/uL (ref 4.22–5.81)
RDW: 13.9 % (ref 11.5–15.5)
WBC: 4.4 10*3/uL (ref 4.0–10.5)
nRBC: 0 % (ref 0.0–0.2)

## 2019-06-04 LAB — RAPID URINE DRUG SCREEN, HOSP PERFORMED
Amphetamines: NOT DETECTED
Barbiturates: NOT DETECTED
Benzodiazepines: POSITIVE — AB
Cocaine: NOT DETECTED
Opiates: POSITIVE — AB
Tetrahydrocannabinol: NOT DETECTED

## 2019-06-04 LAB — SEDIMENTATION RATE: Sed Rate: 79 mm/hr — ABNORMAL HIGH (ref 0–16)

## 2019-06-04 LAB — C-REACTIVE PROTEIN: CRP: 12.6 mg/dL — ABNORMAL HIGH (ref <1.0)

## 2019-06-04 SURGERY — LUMBAR LAMINECTOMY FOR EPIDURAL ABSCESS
Anesthesia: General | Site: Back

## 2019-06-04 MED ORDER — THROMBIN 5000 UNITS EX SOLR
OROMUCOSAL | Status: DC | PRN
  Administered 2019-06-04: 02:00:00 5 mL via TOPICAL

## 2019-06-04 MED ORDER — KETAMINE HCL 50 MG/5ML IJ SOSY
PREFILLED_SYRINGE | INTRAMUSCULAR | Status: AC
  Filled 2019-06-04: qty 5

## 2019-06-04 MED ORDER — OXYCODONE HCL 5 MG PO TABS
10.0000 mg | ORAL_TABLET | Freq: Four times a day (QID) | ORAL | Status: DC | PRN
  Administered 2019-06-04 – 2019-06-06 (×5): 10 mg via ORAL
  Filled 2019-06-04 (×5): qty 2

## 2019-06-04 MED ORDER — TENOFOVIR ALAFENAMIDE FUMARATE 25 MG PO TABS
1.0000 | ORAL_TABLET | Freq: Every day | ORAL | Status: DC
  Administered 2019-06-04 – 2019-06-18 (×15): 25 mg via ORAL
  Filled 2019-06-04 (×18): qty 1

## 2019-06-04 MED ORDER — THROMBIN 5000 UNITS EX SOLR
CUTANEOUS | Status: AC
  Filled 2019-06-04: qty 5000

## 2019-06-04 MED ORDER — NICOTINE 21 MG/24HR TD PT24
21.0000 mg | MEDICATED_PATCH | Freq: Every day | TRANSDERMAL | Status: DC
  Administered 2019-06-05 – 2019-06-18 (×12): 21 mg via TRANSDERMAL
  Filled 2019-06-04 (×15): qty 1

## 2019-06-04 MED ORDER — HYDROMORPHONE HCL 1 MG/ML IJ SOLN: 1.0000 mg | INTRAMUSCULAR | Status: DC | PRN

## 2019-06-04 MED ORDER — CYCLOBENZAPRINE HCL 10 MG PO TABS
10.0000 mg | ORAL_TABLET | Freq: Three times a day (TID) | ORAL | Status: DC | PRN
  Administered 2019-06-04 – 2019-06-17 (×11): 10 mg via ORAL
  Filled 2019-06-04 (×13): qty 1

## 2019-06-04 MED ORDER — ROCURONIUM 10MG/ML (10ML) SYRINGE FOR MEDFUSION PUMP - OPTIME
INTRAVENOUS | Status: DC | PRN
  Administered 2019-06-04: 50 mg via INTRAVENOUS

## 2019-06-04 MED ORDER — ADULT MULTIVITAMIN W/MINERALS CH
1.0000 | ORAL_TABLET | Freq: Every day | ORAL | Status: DC
  Administered 2019-06-04 – 2019-06-18 (×15): 1 via ORAL
  Filled 2019-06-04 (×15): qty 1

## 2019-06-04 MED ORDER — SODIUM CHLORIDE 0.9 % IV SOLN
INTRAVENOUS | Status: DC | PRN
  Administered 2019-06-04: 500 mL

## 2019-06-04 MED ORDER — OXYCODONE HCL 5 MG PO TABS: 5.0000 mg | ORAL_TABLET | Freq: Once | ORAL | Status: DC | PRN

## 2019-06-04 MED ORDER — HYDROMORPHONE HCL 1 MG/ML IJ SOLN
1.0000 mg | INTRAMUSCULAR | Status: DC | PRN
  Administered 2019-06-04 – 2019-06-06 (×10): 1 mg via INTRAVENOUS
  Filled 2019-06-04 (×11): qty 1

## 2019-06-04 MED ORDER — LIDOCAINE 2% (20 MG/ML) 5 ML SYRINGE
INTRAMUSCULAR | Status: DC | PRN
  Administered 2019-06-04: 100 mg via INTRAVENOUS

## 2019-06-04 MED ORDER — KETOROLAC TROMETHAMINE 15 MG/ML IJ SOLN
30.0000 mg | Freq: Four times a day (QID) | INTRAMUSCULAR | Status: AC
  Administered 2019-06-04 – 2019-06-05 (×3): 30 mg via INTRAVENOUS
  Filled 2019-06-04 (×4): qty 2

## 2019-06-04 MED ORDER — MENTHOL 3 MG MT LOZG: 1.0000 | LOZENGE | OROMUCOSAL | Status: DC | PRN

## 2019-06-04 MED ORDER — ONDANSETRON HCL 4 MG/2ML IJ SOLN
INTRAMUSCULAR | Status: DC | PRN
  Administered 2019-06-04: 4 mg via INTRAVENOUS

## 2019-06-04 MED ORDER — SODIUM CHLORIDE 0.9% FLUSH
3.0000 mL | Freq: Two times a day (BID) | INTRAVENOUS | Status: DC
  Administered 2019-06-04 – 2019-06-18 (×6): 3 mL via INTRAVENOUS

## 2019-06-04 MED ORDER — SODIUM CHLORIDE 0.9 % IV SOLN
INTRAVENOUS | Status: DC
  Administered 2019-06-04: 04:00:00 via INTRAVENOUS

## 2019-06-04 MED ORDER — MIDAZOLAM HCL 5 MG/5ML IJ SOLN
INTRAMUSCULAR | Status: DC | PRN
  Administered 2019-06-04: 2 mg via INTRAVENOUS

## 2019-06-04 MED ORDER — PROPOFOL 10 MG/ML IV BOLUS
INTRAVENOUS | Status: AC
  Filled 2019-06-04: qty 20

## 2019-06-04 MED ORDER — ONDANSETRON HCL 4 MG PO TABS: 4.0000 mg | ORAL_TABLET | Freq: Four times a day (QID) | ORAL | Status: DC | PRN

## 2019-06-04 MED ORDER — HYDROMORPHONE HCL 1 MG/ML IJ SOLN
0.2500 mg | INTRAMUSCULAR | Status: DC | PRN
  Administered 2019-06-04 (×2): 0.5 mg via INTRAVENOUS

## 2019-06-04 MED ORDER — ONDANSETRON HCL 4 MG/2ML IJ SOLN: 4.0000 mg | Freq: Four times a day (QID) | INTRAMUSCULAR | Status: DC | PRN

## 2019-06-04 MED ORDER — SODIUM CHLORIDE 0.9 % IV SOLN: 2.0000 g | Freq: Two times a day (BID) | INTRAVENOUS | Status: DC

## 2019-06-04 MED ORDER — SODIUM CHLORIDE 0.9% FLUSH: 3.0000 mL | INTRAVENOUS | Status: DC | PRN

## 2019-06-04 MED ORDER — HYDROCODONE-ACETAMINOPHEN 5-325 MG PO TABS: 1.0000 | ORAL_TABLET | ORAL | Status: DC | PRN

## 2019-06-04 MED ORDER — SODIUM CHLORIDE 0.9 % IV SOLN
2.0000 g | Freq: Once | INTRAVENOUS | Status: AC
  Administered 2019-06-04: 2 g via INTRAVENOUS
  Filled 2019-06-04: qty 20

## 2019-06-04 MED ORDER — OXYCODONE HCL 5 MG/5ML PO SOLN: 5.0000 mg | Freq: Once | ORAL | Status: DC | PRN

## 2019-06-04 MED ORDER — ENSURE ENLIVE PO LIQD
237.0000 mL | Freq: Three times a day (TID) | ORAL | Status: DC
  Administered 2019-06-04 – 2019-06-18 (×25): 237 mL via ORAL

## 2019-06-04 MED ORDER — SENNOSIDES-DOCUSATE SODIUM 8.6-50 MG PO TABS
1.0000 | ORAL_TABLET | Freq: Two times a day (BID) | ORAL | Status: DC
  Administered 2019-06-04 – 2019-06-18 (×28): 1 via ORAL
  Filled 2019-06-04 (×29): qty 1

## 2019-06-04 MED ORDER — MIDAZOLAM HCL 2 MG/2ML IJ SOLN
INTRAMUSCULAR | Status: AC
  Filled 2019-06-04: qty 2

## 2019-06-04 MED ORDER — SODIUM CHLORIDE 0.9 % IV SOLN
2.0000 g | INTRAVENOUS | Status: DC
  Administered 2019-06-05: 2 g via INTRAVENOUS
  Filled 2019-06-04: qty 20
  Filled 2019-06-04: qty 2

## 2019-06-04 MED ORDER — SUFENTANIL CITRATE 50 MCG/ML IV SOLN
INTRAVENOUS | Status: AC
  Filled 2019-06-04: qty 1

## 2019-06-04 MED ORDER — MORPHINE SULFATE (PF) 2 MG/ML IV SOLN
1.0000 mg | INTRAVENOUS | Status: DC | PRN
  Administered 2019-06-04: 1 mg via INTRAVENOUS
  Filled 2019-06-04: qty 1

## 2019-06-04 MED ORDER — SODIUM CHLORIDE 0.9% FLUSH: 10.0000 mL | INTRAVENOUS | Status: DC | PRN

## 2019-06-04 MED ORDER — SUGAMMADEX SODIUM 200 MG/2ML IV SOLN
INTRAVENOUS | Status: DC | PRN
  Administered 2019-06-04: 200 mg via INTRAVENOUS

## 2019-06-04 MED ORDER — ACETAMINOPHEN 325 MG PO TABS
650.0000 mg | ORAL_TABLET | Freq: Four times a day (QID) | ORAL | Status: DC | PRN
  Administered 2019-06-09 – 2019-06-15 (×4): 650 mg via ORAL
  Filled 2019-06-04 (×5): qty 2

## 2019-06-04 MED ORDER — HEMOSTATIC AGENTS (NO CHARGE) OPTIME
TOPICAL | Status: DC | PRN
  Administered 2019-06-04: 1 via TOPICAL

## 2019-06-04 MED ORDER — HYDROMORPHONE HCL 1 MG/ML IJ SOLN
INTRAMUSCULAR | Status: AC
  Administered 2019-06-04: 0.5 mg via INTRAVENOUS
  Filled 2019-06-04: qty 1

## 2019-06-04 MED ORDER — PHENOL 1.4 % MT LIQD: 1.0000 | OROMUCOSAL | Status: DC | PRN

## 2019-06-04 MED ORDER — OXYCODONE HCL ER 15 MG PO T12A
30.0000 mg | EXTENDED_RELEASE_TABLET | Freq: Two times a day (BID) | ORAL | Status: DC
  Administered 2019-06-04 – 2019-06-11 (×15): 30 mg via ORAL
  Filled 2019-06-04 (×15): qty 2

## 2019-06-04 MED ORDER — LACTATED RINGERS IV SOLN
INTRAVENOUS | Status: DC | PRN
  Administered 2019-06-04 (×2): via INTRAVENOUS

## 2019-06-04 MED ORDER — ZOLPIDEM TARTRATE 5 MG PO TABS
5.0000 mg | ORAL_TABLET | Freq: Every evening | ORAL | Status: DC | PRN
  Administered 2019-06-05 – 2019-06-16 (×5): 5 mg via ORAL
  Filled 2019-06-04 (×6): qty 1

## 2019-06-04 MED ORDER — DEXAMETHASONE SODIUM PHOSPHATE 10 MG/ML IJ SOLN
INTRAMUSCULAR | Status: DC | PRN
  Administered 2019-06-04: 10 mg via INTRAVENOUS

## 2019-06-04 MED ORDER — PANTOPRAZOLE SODIUM 40 MG PO TBEC
40.0000 mg | DELAYED_RELEASE_TABLET | Freq: Every day | ORAL | Status: DC
  Administered 2019-06-04 – 2019-06-18 (×14): 40 mg via ORAL
  Filled 2019-06-04 (×15): qty 1

## 2019-06-04 MED ORDER — KETAMINE HCL 50 MG/ML IJ SOLN
INTRAMUSCULAR | Status: DC | PRN
  Administered 2019-06-04 (×5): 10 mg via INTRAMUSCULAR

## 2019-06-04 MED ORDER — SUFENTANIL CITRATE 50 MCG/ML IV SOLN
INTRAVENOUS | Status: DC | PRN
  Administered 2019-06-04: 10 ug via INTRAVENOUS
  Administered 2019-06-04 (×2): 20 ug via INTRAVENOUS

## 2019-06-04 MED ORDER — VANCOMYCIN HCL IN DEXTROSE 1-5 GM/200ML-% IV SOLN
1000.0000 mg | Freq: Three times a day (TID) | INTRAVENOUS | Status: DC
  Administered 2019-06-04 – 2019-06-06 (×7): 1000 mg via INTRAVENOUS
  Filled 2019-06-04 (×7): qty 200

## 2019-06-04 MED ORDER — VANCOMYCIN HCL IN DEXTROSE 1-5 GM/200ML-% IV SOLN
INTRAVENOUS | Status: AC
  Filled 2019-06-04: qty 200

## 2019-06-04 MED ORDER — PROPOFOL 10 MG/ML IV BOLUS
INTRAVENOUS | Status: DC | PRN
  Administered 2019-06-04: 170 mg via INTRAVENOUS

## 2019-06-04 MED ORDER — 0.9 % SODIUM CHLORIDE (POUR BTL) OPTIME
TOPICAL | Status: DC | PRN
  Administered 2019-06-04: 1000 mL

## 2019-06-04 MED ORDER — ACETAMINOPHEN 650 MG RE SUPP: 650.0000 mg | Freq: Four times a day (QID) | RECTAL | Status: DC | PRN

## 2019-06-04 MED ORDER — VANCOMYCIN HCL IN DEXTROSE 1-5 GM/200ML-% IV SOLN
1000.0000 mg | Freq: Once | INTRAVENOUS | Status: AC
  Administered 2019-06-04: 03:00:00 1000 mg via INTRAVENOUS
  Filled 2019-06-04: qty 200

## 2019-06-04 SURGICAL SUPPLY — 61 items
ADH SKN CLS APL DERMABOND .7 (GAUZE/BANDAGES/DRESSINGS) ×1
APL SKNCLS STERI-STRIP NONHPOA (GAUZE/BANDAGES/DRESSINGS) ×1
BAG DECANTER FOR FLEXI CONT (MISCELLANEOUS) ×3 IMPLANT
BENZOIN TINCTURE PRP APPL 2/3 (GAUZE/BANDAGES/DRESSINGS) ×3 IMPLANT
BLADE CLIPPER SURG (BLADE) IMPLANT
BUR CUTTER 7.0 ROUND (BURR) ×3 IMPLANT
CANISTER SUCT 3000ML PPV (MISCELLANEOUS) ×3 IMPLANT
CARTRIDGE OIL MAESTRO DRILL (MISCELLANEOUS) ×1 IMPLANT
CLOSURE STERI-STRIP 1/2X4 (GAUZE/BANDAGES/DRESSINGS) ×1
CLOSURE WOUND 1/2 X4 (GAUZE/BANDAGES/DRESSINGS) ×1
CLSR STERI-STRIP ANTIMIC 1/2X4 (GAUZE/BANDAGES/DRESSINGS) ×1 IMPLANT
COVER WAND RF STERILE (DRAPES) ×3 IMPLANT
DECANTER SPIKE VIAL GLASS SM (MISCELLANEOUS) ×3 IMPLANT
DERMABOND ADVANCED (GAUZE/BANDAGES/DRESSINGS) ×2
DERMABOND ADVANCED .7 DNX12 (GAUZE/BANDAGES/DRESSINGS) ×1 IMPLANT
DIFFUSER DRILL AIR PNEUMATIC (MISCELLANEOUS) ×3 IMPLANT
DRAPE HALF SHEET 40X57 (DRAPES) IMPLANT
DRAPE LAPAROTOMY 100X72X124 (DRAPES) ×3 IMPLANT
DRAPE MICROSCOPE LEICA (MISCELLANEOUS) ×3 IMPLANT
DRAPE POUCH INSTRU U-SHP 10X18 (DRAPES) ×3 IMPLANT
DRAPE SURG 17X23 STRL (DRAPES) ×6 IMPLANT
DRSG OPSITE 4X5.5 SM (GAUZE/BANDAGES/DRESSINGS) ×2 IMPLANT
DRSG OPSITE POSTOP 3X4 (GAUZE/BANDAGES/DRESSINGS) ×2 IMPLANT
DURAPREP 26ML APPLICATOR (WOUND CARE) ×3 IMPLANT
ELECT REM PT RETURN 9FT ADLT (ELECTROSURGICAL) ×3
ELECTRODE REM PT RTRN 9FT ADLT (ELECTROSURGICAL) ×1 IMPLANT
GAUZE 4X4 16PLY RFD (DISPOSABLE) IMPLANT
GAUZE SPONGE 4X4 12PLY STRL (GAUZE/BANDAGES/DRESSINGS) ×3 IMPLANT
GLOVE BIO SURGEON STRL SZ 6.5 (GLOVE) ×1 IMPLANT
GLOVE BIO SURGEON STRL SZ7.5 (GLOVE) ×2 IMPLANT
GLOVE BIO SURGEONS STRL SZ 6.5 (GLOVE) ×1
GLOVE BIOGEL PI IND STRL 6.5 (GLOVE) IMPLANT
GLOVE BIOGEL PI IND STRL 7.5 (GLOVE) IMPLANT
GLOVE BIOGEL PI INDICATOR 6.5 (GLOVE) ×2
GLOVE BIOGEL PI INDICATOR 7.5 (GLOVE) ×2
GLOVE ECLIPSE 9.0 STRL (GLOVE) ×3 IMPLANT
GLOVE EXAM NITRILE XL STR (GLOVE) IMPLANT
GOWN STRL REUS W/ TWL LRG LVL3 (GOWN DISPOSABLE) IMPLANT
GOWN STRL REUS W/ TWL XL LVL3 (GOWN DISPOSABLE) ×1 IMPLANT
GOWN STRL REUS W/TWL 2XL LVL3 (GOWN DISPOSABLE) IMPLANT
GOWN STRL REUS W/TWL LRG LVL3 (GOWN DISPOSABLE) ×6
GOWN STRL REUS W/TWL XL LVL3 (GOWN DISPOSABLE) ×3
KIT BASIN OR (CUSTOM PROCEDURE TRAY) ×3 IMPLANT
KIT TURNOVER KIT B (KITS) ×3 IMPLANT
NDL SPNL 22GX3.5 QUINCKE BK (NEEDLE) ×1 IMPLANT
NEEDLE HYPO 22GX1.5 SAFETY (NEEDLE) ×3 IMPLANT
NEEDLE SPNL 22GX3.5 QUINCKE BK (NEEDLE) ×3 IMPLANT
NS IRRIG 1000ML POUR BTL (IV SOLUTION) ×3 IMPLANT
OIL CARTRIDGE MAESTRO DRILL (MISCELLANEOUS) ×3
PACK LAMINECTOMY NEURO (CUSTOM PROCEDURE TRAY) ×3 IMPLANT
PAD ARMBOARD 7.5X6 YLW CONV (MISCELLANEOUS) ×9 IMPLANT
RUBBERBAND STERILE (MISCELLANEOUS) ×6 IMPLANT
SPONGE SURGIFOAM ABS GEL SZ50 (HEMOSTASIS) ×3 IMPLANT
STRIP CLOSURE SKIN 1/2X4 (GAUZE/BANDAGES/DRESSINGS) ×2 IMPLANT
SUT VIC AB 2-0 CT1 18 (SUTURE) ×3 IMPLANT
SUT VIC AB 3-0 SH 8-18 (SUTURE) ×3 IMPLANT
SWAB COLLECTION DEVICE MRSA (MISCELLANEOUS) IMPLANT
SWAB CULTURE ESWAB REG 1ML (MISCELLANEOUS) IMPLANT
TOWEL GREEN STERILE (TOWEL DISPOSABLE) ×3 IMPLANT
TOWEL GREEN STERILE FF (TOWEL DISPOSABLE) ×3 IMPLANT
WATER STERILE IRR 1000ML POUR (IV SOLUTION) ×3 IMPLANT

## 2019-06-04 NOTE — Op Note (Signed)
Date of procedure: 06/04/2019  Date of dictation: Same  Service: Neurosurgery  Preoperative diagnosis: L5-S1 epidural abscess with cauda equina syndrome  Postoperative diagnosis: Same  Procedure Name: L5-S1 decompressive laminectomy with evacuation of epidural abscess  Surgeon:Charee Tumblin A.Radha Coggins, M.D.  Asst. Surgeon: Reinaldo Meeker, NP  Anesthesia: General  Indication: 48 year old male history of IV drug abuse and cirrhosis secondary to hepatitis.  Patient with prior history of L3-4 osteomyelitis discitis status post antibiotic treatment.  Patient presents now with severe back pain and urinary retention.  Work-up demonstrates evidence of probable osteomyelitis with septic facet joint on the right side at L5-S1.  There is secondary epidural abscess with extension into his sacral canal with severe constriction of the thecal sac and compression of the lower cauda equina.  Patient presents now for emergent decompression and evacuation of abscess.  Operative note: After induction anesthesia, patient position prone on the Wilson frame and appropriately padded.  Lumbar region prepped and draped sterilely.  Incision made over L5-S1.  Dissection performed bilaterally.  Retractor placed.  Surface landmarks were used to identify the L5-S1 interspace.  Inferior aspect of the spinous processes of L5 was resected.  Partial inferior laminectomy of L5 and superior laminectomy S1 was then performed using high-speed drill and Kerrison Rogers.  Ligament flavum elevated.  Upon elevating the ligamentum flavum pus was returned.  Pus was sampled and cultures were taken.  The epidural abscesses were followed into the sacral canal and I felt good about the decompression that was achieved.  There was no evidence of injury to the thecal sac or nerve roots.  The tissue itself was hyperemic and hemostasis was somewhat difficult to control.  After cultures have been obtained the patient was given vancomycin and Rocephin.  Hemostasis was  eventually achieved.  A medium Hemovac drain was left in the epidural space.  Wounds and closed in layers of Vicryl sutures.  Steri-Strips and sterile dressing were applied.  No apparent complications.  Patient tolerated the procedure reasonably well and returned to recovery room postop.

## 2019-06-04 NOTE — Progress Notes (Addendum)
Pharmacy Antibiotic Note  Dan [name] is a 48 y.o. male admitted on 06/03/2019 with epidural abscess.  Pharmacy has been consulted for Vancomycin dosing. Pt now pos-op laminectomy with evacuation of abscess. WBC WNL. Renal function good.   Plan: Vancomycin 1000 mg IV q8h >>Estimated AUC: 523 Ceftriaxone 2g IV q24h per MD Trend WBC, temp, renal function  Drug levels as indicated F/U cultures to guide therapy   Height: 5\' 11"  (180.3 cm) Weight: 150 lb (68 kg) IBW/kg (Calculated) : 75.3  Temp (24hrs), Avg:97.9 F (36.6 C), Min:97.5 F (36.4 C), Max:98.2 F (36.8 C)  Recent Labs  Lab 06/03/19 1600  WBC 6.4  CREATININE 0.70    Estimated Creatinine Clearance: 107.4 mL/min (by C-G formula based on SCr of 0.7 mg/dL).    No Known Allergies   Narda Bonds 06/04/2019 4:34 AM

## 2019-06-04 NOTE — Evaluation (Signed)
Physical Therapy Evaluation Patient Details Name: Dan [name] MRN: [id number] DOB: 03-Apr-1971 Today's Date: 06/04/2019   History of Present Illness  48 y.o. male with medical history significant of anxiety, chronic low back pain, liver cirrhosis, depression, hep B, hep C, polysubstance abuse presenting with a chief complaint of generalized body aches. MRI findings included EPIDURAL ABSCESS, LUMBAR. Pt is now s/p LUMBAR LAMINECTOMY FOR EPIDURAL ABSCESS, L5-S1.  Clinical Impression  PTA pt living with parent in mobile home with 6 steps to enter, parent unable to provide any physical assist. Pt was independent with ambulation and iADLs. Pt is currently limited by back pain, as well as decreased strength and endurance. Pt only agreeable to sitting EoB for evaluation due to increased pain with movement. Pt requires minA for getting to the EoB and min guard for returning to supine. Given parent's decreased ability to provide assist and pt's generalized weakness PT recommending SNF level rehab at discharge to return to PLOF. PT will continue to follow acutely.     Follow Up Recommendations SNF    Equipment Recommendations  Other (comment)(TBD at next venue)       Precautions / Restrictions Precautions Precautions: Fall Restrictions Weight Bearing Restrictions: No      Mobility  Bed Mobility Overal bed mobility: Needs Assistance Bed Mobility: Supine to Sit;Sit to Supine     Supine to sit: Min guard;Min assist;HOB elevated Sit to supine: Min guard;HOB elevated   General bed mobility comments: light steadying assist. Pt using BUE to pivot hips to full EOB position.   Transfers                 General transfer comment: Pt declined standing this session. "I'm not standing today."         Balance Overall balance assessment: Needs assistance Sitting-balance support: Bilateral upper extremity supported;Feet supported Sitting balance-Leahy Scale: Fair                                        Pertinent Vitals/Pain Pain Assessment: 0-10 Pain Score: 7  Pain Location: back and radiating into BLE Pain Descriptors / Indicators: Operative site guarding;Grimacing;Moaning Pain Intervention(s): Premedicated before session;Monitored during session;Limited activity within patient's tolerance;Repositioned    Home Living Family/patient expects to be discharged to:: Private residence Living Arrangements: Parent Available Help at Discharge: Available PRN/intermittently Type of Home: Mobile home Home Access: Stairs to enter Entrance Stairs-Rails: Right Entrance Stairs-Number of Steps: 6 Home Layout: One level Home Equipment: Walker - 2 wheels;Cane - single point;Wheelchair - manual Additional Comments: Lives with mother, and states she is unable to physically assist.     Prior Function Level of Independence: Independent                  Extremity/Trunk Assessment   Upper Extremity Assessment Upper Extremity Assessment: Defer to OT evaluation    Lower Extremity Assessment Lower Extremity Assessment: RLE deficits/detail;LLE deficits/detail RLE Deficits / Details: ROM WFL, strength grossly 4/5 RLE Sensation: WNL RLE Coordination: WNL LLE Deficits / Details: ROM WFL strength grossly assessed at 4/5 LLE Sensation: WNL LLE Coordination: WNL    Cervical / Trunk Assessment Cervical / Trunk Assessment: Normal  Communication   Communication: No difficulties  Cognition Arousal/Alertness: Awake/alert Behavior During Therapy: Anxious Overall Cognitive Status: Within Functional Limits for tasks assessed  General Comments General comments (skin integrity, edema, etc.): SaO2 on RA 97%O2, VSS        Assessment/Plan    PT Assessment Patient needs continued PT services  PT Problem List Decreased strength;Decreased activity tolerance;Decreased balance;Decreased mobility;Pain       PT  Treatment Interventions DME instruction;Gait training;Stair training;Functional mobility training;Therapeutic activities;Therapeutic exercise;Balance training;Cognitive remediation;Patient/family education    PT Goals (Current goals can be found in the Care Plan section)  Acute Rehab PT Goals Patient Stated Goal: rehab at SNF then home PT Goal Formulation: With patient Time For Goal Achievement: 06/18/19 Potential to Achieve Goals: Good    Frequency Min 3X/week   Barriers to discharge Decreased caregiver support      Co-evaluation PT/OT/SLP Co-Evaluation/Treatment: Yes Reason for Co-Treatment: For patient/therapist safety PT goals addressed during session: Mobility/safety with mobility OT goals addressed during session: ADL's and self-care       AM-PAC PT "6 Clicks" Mobility  Outcome Measure Help needed turning from your back to your side while in a flat bed without using bedrails?: None Help needed moving from lying on your back to sitting on the side of a flat bed without using bedrails?: None Help needed moving to and from a bed to a chair (including a wheelchair)?: A Lot Help needed standing up from a chair using your arms (e.g., wheelchair or bedside chair)?: A Lot Help needed to walk in hospital room?: A Lot Help needed climbing 3-5 steps with a railing? : Total 6 Click Score: 15    End of Session Equipment Utilized During Treatment: Gait belt Activity Tolerance: Patient tolerated treatment well Patient left: in bed;with call bell/phone within reach;with bed alarm set;with SCD's reapplied Nurse Communication: Mobility status PT Visit Diagnosis: Unsteadiness on feet (R26.81);Other abnormalities of gait and mobility (R26.89);Muscle weakness (generalized) (M62.81);Difficulty in walking, not elsewhere classified (R26.2);Other symptoms and signs involving the nervous system (Z61.096(R29.898)    Time: [id number] PT Time Calculation (min) (ACUTE ONLY): 18 min   Charges:   PT  Evaluation $PT Eval Moderate Complexity: 1 Mod          Dan [name] PT, DPT Acute Rehabilitation Services Pager [phone] Office [phone]   Dan [name] 06/04/2019, 4:04 PM

## 2019-06-04 NOTE — Progress Notes (Signed)
OT Cancellation Note  Patient Details Name: Dan [name] MRN: [id number] DOB: 1971/11/03   Cancelled Treatment:    Reason Eval/Treat Not Completed: Pain limiting ability to participate. Pt with 10/10 back pain. Nursing notified and oral pain med given. Plan to reattempt in a bit.  Tyrone Schimke, OT Acute Rehabilitation Services Pager: [phone] Office: [phone]  06/04/2019, 11:20 AM

## 2019-06-04 NOTE — Progress Notes (Signed)
I was called from the emergency room for possible orthopedic consultation.  Patient with osteomyelitis of the spine, as well as epidural abscess, neurosurgery has been consulted.  This is outside of my scope of practice, and I would defer to neurosurgery regarding management.  If orthopedic involvement is further needed, please feel free to contact me, or reconsult as necessary.  Johnny Bridge, MD

## 2019-06-04 NOTE — Progress Notes (Signed)
Received patient from PACU with gauze dressing and hemovac at left lower back.  Patient AOx4, VSS, O2Sat at 98% on RA, oriented to room, bed control, call light and plan of care.  Gave graham crackers and ice cream to eat and 2 coke to drink.  Administered PRN morphine 1mg  Inj and flexeril for pain at 10/10 after repositioning on bed, now resting on bed with both eyes closed.  Will monitor.

## 2019-06-04 NOTE — Anesthesia Procedure Notes (Signed)
Procedure Name: Intubation Date/Time: 06/04/2019 1:49 AM Performed by: Claris Che, CRNA Pre-anesthesia Checklist: Patient identified, Emergency Drugs available, Suction available, Patient being monitored and Timeout performed Patient Re-evaluated:Patient Re-evaluated prior to induction Oxygen Delivery Method: Circle system utilized Preoxygenation: Pre-oxygenation with 100% oxygen Induction Type: IV induction Ventilation: Mask ventilation without difficulty Laryngoscope Size: Mac and 3 Grade View: Grade II Tube type: Oral Tube size: 8.0 mm Number of attempts: 1 Airway Equipment and Method: Stylet Placement Confirmation: ETT inserted through vocal cords under direct vision,  positive ETCO2 and breath sounds checked- equal and bilateral Secured at: 23 cm Tube secured with: Tape Dental Injury: Teeth and Oropharynx as per pre-operative assessment

## 2019-06-04 NOTE — Evaluation (Signed)
Occupational Therapy Evaluation Patient Details Name: Dan [name] MRN: [id number] DOB: Dec 06, 1971 Today's Date: 06/04/2019    History of Present Illness 48 y.o. male with medical history significant of anxiety, chronic low back pain, liver cirrhosis, depression, hep B, hep C, polysubstance abuse presenting with a chief complaint of generalized body aches. MRI findings included EPIDURAL ABSCESS, LUMBAR. Pt is now s/p LUMBAR LAMINECTOMY FOR EPIDURAL ABSCESS, L5-S1.   Clinical Impression   Pt admitted with the above diagnoses and presents with below problem list. Pt will benefit from continued acute OT to address the below listed deficits and maximize independence with basic ADLs prior to d/c to venue below. PTA pt was independent with ADLs, reports he walks around his neighborhood. Pt limited by pain this session. Completed bed mobility and sat EOB a few minutes. Declined further OOB despite encouragement, "I'm not standing today. My surgery was just this morning." Pt agreeable to goal of getting up to recliner tomorrow. Lives with elderly parent who is limited in the amount assistance they can give. Recommend ST SNF for rehab prior to d/c home.     Follow Up Recommendations  SNF    Equipment Recommendations  Other (comment)(defer to next venue)    Recommendations for Other Services       Precautions / Restrictions Precautions Precautions: Fall Restrictions Weight Bearing Restrictions: No      Mobility Bed Mobility Overal bed mobility: Needs Assistance Bed Mobility: Supine to Sit;Sit to Supine     Supine to sit: Min guard;Min assist;HOB elevated Sit to supine: Min guard;HOB elevated   General bed mobility comments: light steadying assist. Pt using BUE to pivot hips to full EOB position.   Transfers                 General transfer comment: Pt declined standing this session. "I'm not standing today."     Balance Overall balance assessment: Needs  assistance Sitting-balance support: Bilateral upper extremity supported;Feet supported Sitting balance-Leahy Scale: Fair                                     ADL either performed or assessed with clinical judgement   ADL Overall ADL's : Needs assistance/impaired Eating/Feeding: Set up;Sitting   Grooming: Set up;Sitting;Bed level   Upper Body Bathing: Minimal assistance;Sitting   Lower Body Bathing: Minimal assistance;Sitting/lateral leans   Upper Body Dressing : Set up;Sitting;Minimal assistance   Lower Body Dressing: Minimal assistance;Sitting/lateral leans                 General ADL Comments: Pt completed supine<>EOB. Sat EOB a few munites with BUE support.     Vision         Perception     Praxis      Pertinent Vitals/Pain Pain Assessment: 0-10 Pain Score: 7  Pain Location: back and radiating into BLE Pain Descriptors / Indicators: Operative site guarding;Grimacing;Moaning Pain Intervention(s): Premedicated before session;Monitored during session;Limited activity within patient's tolerance;Repositioned     Hand Dominance     Extremity/Trunk Assessment Upper Extremity Assessment Upper Extremity Assessment: Overall WFL for tasks assessed   Lower Extremity Assessment Lower Extremity Assessment: Defer to PT evaluation       Communication Communication Communication: No difficulties   Cognition Arousal/Alertness: Awake/alert Behavior During Therapy: Anxious Overall Cognitive Status: Within Functional Limits for tasks assessed  General Comments       Exercises     Shoulder Instructions      Home Living Family/patient expects to be discharged to:: Private residence Living Arrangements: Parent Available Help at Discharge: Available PRN/intermittently Type of Home: Mobile home Home Access: Stairs to enter Entrance Stairs-Number of Steps: 6 Entrance Stairs-Rails: Right Home  Layout: One level     Bathroom Shower/Tub: Chief Strategy OfficerTub/shower unit   Bathroom Toilet: Standard     Home Equipment: Environmental consultantWalker - 2 wheels;Cane - single point;Wheelchair - manual   Additional Comments: Lives with mother, and states she is unable to physically assist.       Prior Functioning/Environment Level of Independence: Independent                 OT Problem List: Impaired balance (sitting and/or standing);Decreased knowledge of use of DME or AE;Decreased knowledge of precautions;Pain      OT Treatment/Interventions: Self-care/ADL training;DME and/or AE instruction;Therapeutic activities;Patient/family education;Balance training    OT Goals(Current goals can be found in the care plan section) Acute Rehab OT Goals Patient Stated Goal: rehab at SNF then home OT Goal Formulation: With patient Time For Goal Achievement: 06/18/19 Potential to Achieve Goals: Good ADL Goals Pt Will Perform Lower Body Bathing: with modified independence;sit to/from stand Pt Will Perform Lower Body Dressing: with modified independence;sit to/from stand Pt Will Transfer to Toilet: with modified independence;ambulating Pt Will Perform Toileting - Clothing Manipulation and hygiene: with modified independence;sit to/from stand Pt Will Perform Tub/Shower Transfer: with supervision;ambulating;shower seat;rolling walker  OT Frequency: Min 2X/week   Barriers to D/C:            Co-evaluation PT/OT/SLP Co-Evaluation/Treatment: Yes Reason for Co-Treatment: For patient/therapist safety;To address functional/ADL transfers   OT goals addressed during session: ADL's and self-care      AM-PAC OT "6 Clicks" Daily Activity     Outcome Measure Help from another person eating meals?: None Help from another person taking care of personal grooming?: None Help from another person toileting, which includes using toliet, bedpan, or urinal?: A Little Help from another person bathing (including washing, rinsing,  drying)?: A Lot Help from another person to put on and taking off regular upper body clothing?: A Little Help from another person to put on and taking off regular lower body clothing?: A Little 6 Click Score: 19   End of Session    Activity Tolerance: Patient limited by pain;Patient tolerated treatment well Patient left: in bed;with call bell/phone within reach;with SCD's reapplied;with bed alarm set  OT Visit Diagnosis: Pain;Unsteadiness on feet (R26.81)                Time: [id number] OT Time Calculation (min): 15 min Charges:  OT General Charges $OT Visit: 1 Visit OT Evaluation $OT Eval Low Complexity: 1 Low  Raynald KempKathryn Zamaya Rapaport, OT Acute Rehabilitation Services Pager: [phone] Office: [phone]   Pilar GrammesMathews, Olegario Emberson H 06/04/2019, 3:49 PM

## 2019-06-04 NOTE — Brief Op Note (Signed)
06/03/2019 - 06/04/2019  2:56 AM  PATIENT:  Dan [name]  48 y.o. male  PRE-OPERATIVE DIAGNOSIS:  EPIDURAL ABSCESS, LUMBAR  POST-OPERATIVE DIAGNOSIS:  EPIDURAL ABSCESS, LUMBAR  PROCEDURE:  Procedure(s): LUMBAR LAMINECTOMY FOR EPIDURAL ABSCESS, L5-S1 (N/A)  SURGEON:  Surgeon(s) and Role:    * Floretta Petro, Mallie Mussel, MD - Primary  PHYSICIAN ASSISTANT:   ASSISTANTS: bergman, NP   ANESTHESIA:   general  EBL:  200 mL   BLOOD ADMINISTERED:none  DRAINS: (med) Hemovact drain(s) in the epidural space with  Suction Open   LOCAL MEDICATIONS USED:  MARCAINE     SPECIMEN:  No Specimen  DISPOSITION OF SPECIMEN:  N/A  COUNTS:  YES  TOURNIQUET:  * No tourniquets in log *  DICTATION: .Dragon Dictation  PLAN OF CARE: Admit to inpatient   PATIENT DISPOSITION:  PACU - hemodynamically stable.   Delay start of Pharmacological VTE agent (>24hrs) due to surgical blood loss or risk of bleeding: yes

## 2019-06-04 NOTE — ED Notes (Signed)
Admitting MD at the bedside.  

## 2019-06-04 NOTE — Progress Notes (Signed)
[facility] Progress Note  Patient: Dan [name] NID:[id number]   PCP: Imagene Riches, NP DOB: 09/20/1971   DOA: 06/03/2019   DOS: 06/04/2019   Date of Service: the patient was seen and examined on 06/04/2019  Brief hospital course: Pt. with PMH of anxiety, chronic low back pain, liver cirrhosis, depression, hep B, hep C, polysubstance abuse presenting with a chief complaint of generalized body aches. MRI findings included EPIDURAL ABSCESS, LUMBAR. Pt is now s/p LUMBAR LAMINECTOMY FOR EPIDURAL ABSCESS, L5-S1. Currently further plan is Continue antibiotics and follow ID recommendation.  Subjective: Continues to report back pain.  No nausea no vomiting.  No fever no chills.  Assessment and Plan: Lumbar epidural abscess with severe cauda equina compression and acute urinary retention Afebrile and no leukocytosis. No lower extremity weakness or paresthesias.  -Frequent neurochecks -Foley catheter placed -Patient was seen by neurosurgery and underwent emergent decompression with evacuation of epidural abscess. -Intraoperative cultures taken -Started on ceftriaxone and vancomycin -Pain management postop: Dilaudid PRN severe pain, OxyIR PRN, OxyContin and Toradol scheduled.  Reassess in a.m. and de-escalate if appropriate. -Continuous pulse ox -Blood culture x2 pending -Fungal culture pending  Septic arthritis of the right L5-S1 facet joint and right SI joint, osteomyelitis of the right sacrum and right L5 pedicle Afebrile and no leukocytosis.  Hemodynamically stable.  ESR and CRP elevated. -Ceftriaxone and vancomycin. -Blood culture x2 pending -Fungal culture pending -ID will consult in a.m.  Mild hyponatremia Sodium 130.   -IV fluid hydration -Continue to monitor BMP  Elevated alkaline phosphatase Alkaline phosphatase 207.  Likely secondary to bone pathology/osteomyelitis.  History of polysubstance abuse -Check UDS  Tobacco use -Counseled to quit -NicoDerm patch   Diet: Regular diet DVT Prophylaxis: Subcutaneous Heparin   Advance goals of care discussion: full code  Family Communication: no family was present at bedside, at the time of interview.   Disposition:  Will need to stay in the hospital versus SNF to complete the IV antibiotics given history of IVDA.  Consultants: Infectious disease, neurosurgery, orthopedics Procedures: L5-S1 decompressive laminectomy with evacuation of epidural abscess  Scheduled Meds: . feeding supplement (ENSURE ENLIVE)  237 mL Oral TID BM  . ketorolac  30 mg Intravenous Q6H  . multivitamin with minerals  1 tablet Oral Daily  . nicotine  21 mg Transdermal Daily  . oxyCODONE  30 mg Oral Q12H  . pantoprazole  40 mg Oral Daily  . senna-docusate  1 tablet Oral BID  . sodium chloride flush  3 mL Intravenous Q12H  . Tenofovir Alafenamide Fumarate  1 tablet Oral Daily   Continuous Infusions: . cefTRIAXone (ROCEPHIN)  IV    . vancomycin 1,000 mg (06/04/19 1125)   PRN Meds: acetaminophen **OR** acetaminophen, cyclobenzaprine, HYDROmorphone (DILAUDID) injection, menthol-cetylpyridinium **OR** phenol, ondansetron **OR** ondansetron (ZOFRAN) IV, oxyCODONE, sodium chloride flush Antibiotics: Anti-infectives (From admission, onward)   Start     Dose/Rate Route Frequency Ordered Stop   06/04/19 2200  cefTRIAXone (ROCEPHIN) 2 g in sodium chloride 0.9 % 100 mL IVPB     2 g 200 mL/hr over 30 Minutes Intravenous Every 24 hours 06/04/19 0421     06/04/19 1100  vancomycin (VANCOCIN) IVPB 1000 mg/200 mL premix     1,000 mg 200 mL/hr over 60 Minutes Intravenous Every 8 hours 06/04/19 0437     06/04/19 1000  Tenofovir Alafenamide Fumarate TABS 25 mg     1 tablet Oral Daily 06/04/19 0421     06/04/19 0222  bacitracin 50,000 Units in  sodium chloride 0.9 % 500 mL irrigation  Status:  Discontinued       As needed 06/04/19 0222 06/04/19 0304   06/04/19 0145  vancomycin (VANCOCIN) IVPB 1000 mg/200 mL premix     1,000 mg 200 mL/hr  over 60 Minutes Intravenous  Once 06/04/19 0133 06/04/19 0250   06/04/19 0145  cefTRIAXone (ROCEPHIN) 2 g in sodium chloride 0.9 % 100 mL IVPB  Status:  Discontinued     2 g 200 mL/hr over 30 Minutes Intravenous Every 12 hours 06/04/19 0133 06/04/19 0134   06/04/19 0145  cefTRIAXone (ROCEPHIN) 2 g in sodium chloride 0.9 % 100 mL IVPB     2 g 200 mL/hr over 30 Minutes Intravenous  Once 06/04/19 0134 06/04/19 0240   06/04/19 0134  vancomycin (VANCOCIN) 1-5 GM/200ML-% IVPB  Status:  Discontinued    Note to Pharmacy: Rejeana Brock   : cabinet override      06/04/19 0134 06/04/19 0140       Objective: Physical Exam: Vitals:   06/04/19 0354 06/04/19 0421 06/04/19 0935 06/04/19 1353  BP: 116/76 116/77 129/84 108/67  Pulse: 83 72 61 75  Resp: (!) '21 19 15 18  ' Temp: (!) 97.5 F (36.4 C) 98.2 F (36.8 C) 97.7 F (36.5 C) 98.2 F (36.8 C)  TempSrc:  Oral Oral Oral  SpO2: 100% 98% 100% 100%  Weight:      Height:        Intake/Output Summary (Last 24 hours) at 06/04/2019 1653 Last data filed at 06/04/2019 0617 Gross per 24 hour  Intake 2501.56 ml  Output 1320 ml  Net 1181.56 ml   Filed Weights   06/03/19 1452  Weight: 68 kg   General: alert and oriented to time, place, and person. Appear in moderate distress, affect appropriate Eyes: PERRL, Conjunctiva normal ENT: Oral Mucosa Clear, moist  Neck: no JVD, no Abnormal Mass Or lumps Cardiovascular: S1 and S2 Present, no Murmur, peripheral pulses symmetrical Respiratory: normal respiratory effort, Bilateral Air entry equal and Decreased, no use of accessory muscle, Clear to Auscultation, no Crackles, no wheezes Abdomen: Bowel Sound present, Soft and no tenderness, no hernia Skin: no rashes  Extremities: no Pedal edema, no calf tenderness Neurologic: normal without focal findings, mental status, speech normal, alert and oriented x3, PERLA, Motor strength 5/5 and symmetric and sensation grossly normal to light touch Gait not checked due  to patient safety concerns  Data Reviewed: CBC: Recent Labs  Lab 06/03/19 1600 06/04/19 0748  WBC 6.4 4.4  NEUTROABS 4.7  --   HGB 11.4* 11.9*  HCT 35.1* 36.2*  MCV 87.3 85.6  PLT 81* 71*   Basic Metabolic Panel: Recent Labs  Lab 06/03/19 1600  NA 130*  K 3.9  CL 95*  CO2 24  GLUCOSE 86  BUN 18  CREATININE 0.70  CALCIUM 8.1*    Liver Function Tests: Recent Labs  Lab 06/03/19 1600  AST 25  ALT 26  ALKPHOS 207*  BILITOT 0.7  PROT 7.4  ALBUMIN 2.3*   No results for input(s): LIPASE, AMYLASE in the last 168 hours. No results for input(s): AMMONIA in the last 168 hours. Coagulation Profile: No results for input(s): INR, PROTIME in the last 168 hours. Cardiac Enzymes: Recent Labs  Lab 06/03/19 1600  TROPONINI <0.03   BNP (last 3 results) No results for input(s): PROBNP in the last 8760 hours. CBG: No results for input(s): GLUCAP in the last 168 hours. Studies: Mr Lumbar Spine W Wo Contrast  Result Date: 06/03/2019 CLINICAL DATA:  Back pain, cauda equina syndrome suspected. EXAM: MRI LUMBAR SPINE WITHOUT AND WITH CONTRAST TECHNIQUE: Multiplanar and multiecho pulse sequences of the lumbar spine were obtained without and with intravenous contrast. CONTRAST:  Gadavist 6 mL. COMPARISON:  09/30/2018 FINDINGS: Segmentation:  Standard. Alignment: Straightening, and reversal, of the normal lumbar lordosis. Vertebrae: T12-L1 diskitis with regional osteomyelitis as well as L3-L4 diskitis with regional osteomyelitis has healed. However, there is abnormal bone marrow edema at L5 and S1 on the RIGHT extending into the RIGHT sacral ala and SI joint. Conus medullaris and cauda equina: Conus extends to the L1 level level. Conus appears normal. There is relative stenosis at L3-4 due to chronic L3 retropulsion. Below this, there are space-occupying fluid collections in the epidural space, RIGHT greater than LEFT extending into the sacral canal consistent with epidural abscesses. The  show peripheral enhancement. See series 11, image 30 where a RIGHT-sided collection measures 8 x 17 mm cross-section. A LEFT-sided collection at this level is slightly smaller. Paraspinal and other soft tissues: Paravertebral abscesses are seen dorsal to the RIGHT L5-S1 facet, roughly 1 cm in size, and are indicative of septic facet arthritis. Disc levels: L1-L2: Central and leftward protrusion. Mild stenosis but no impingement. L2-L3:  Unremarkable. L3-L4: Healed discitis and osteomyelitis. L3 retropulsion. Moderate stenosis. BILATERAL subarticular zone narrowing. L4-L5: Disc space narrowing. Central protrusion. Posterior element hypertrophy. Moderate stenosis. BILATERAL L5 neural impingement likely. L5-S1: Advanced disc space narrowing. Central protrusion. Trefoil appearance to the thecal sac due to BILATERAL epidural abscesses, severe compression. Significant subarticular zone and foraminal zone narrowing affecting the L5 and S1 nerve roots. Abnormal edema of L5 on the RIGHT and S1 on the RIGHT reflecting septic facet joint and septic sacroiliac joint. Enhancing abscesses extend into the sacral canal, greater on the RIGHT. IMPRESSION: T12-L1 and L3-4 discitis with regional osteomyelitis has essentially resolved. Moderate stenosis at L3-4 related to retropulsion of L3. The patient has developed septic arthritis of the RIGHT L5-S1 facet joint, and septic arthritis of the RIGHT sacroiliac joint. Osteomyelitis of the RIGHT sacrum and RIGHT L5 pedicle are noted. Epidural abscesses in the lower lumbar and upper sacral canal, resulting in severe compression of the thecal sac and cauda equina nerve roots. Depending on the patient's neurologic status, neurosurgical consultation may be warranted. These results were called by telephone at the time of interpretation on 06/03/2019 at 9:36 pm to Dr. Gareth Morgan , who verbally acknowledged these results. Electronically Signed   By: Staci Righter M.D.   On: 06/03/2019 21:41      Time spent: 35 minutes  Author: Berle Mull, MD [facility] 06/04/2019 4:53 PM  To reach On-call, see care teams to locate the attending and reach out to them via [email or web address]. If 7PM-7AM, please contact night-coverage If you still have difficulty reaching the attending provider, please page the [facility] (Director on Call) for [facility] on amion for assistance.

## 2019-06-04 NOTE — Consult Note (Signed)
Reason for Consult: Epidural abscess Referring Physician: Emergency department  Dan [name] is an 48 y.o. male.  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  Patient with history of IV drug abuse.  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  Patient presents now with severe back pain with some radiation into his lower extremities.  Symptoms are greatly aggravated by standing or walking.  Patient with urinary retention.  Work-up demonstrates evidence of a lumbar epidural abscess with significant facet joint involvement at L5-S1.  Patient with critical constriction of his lower thecal sac at the L5-S1 level extending into his sacrum.  Patient denies sacral anesthesia.  He did have significant urinary retention with post void residual of more than 700.  He has had no fecal incontinence.  Past Medical History:  Diagnosis Date  . Anxiety   . Chronic lower back pain   . Chronic pain   . Cirrhosis of liver ([place])   . Depression   . Gastric ulceration   . Hepatitis B   . Hepatitis C    "never tx'd" (09/30/2018)  . History of kidney stones   . Polysubstance abuse ([place])   . Upper GI bleed     Past Surgical History:  Procedure Laterality Date  . BIOPSY  10/10/2018   Procedure: BIOPSY;  Surgeon: Rush Landmark Telford Nab., MD;  Location: [place];  Service: Gastroenterology;;  . CYSTOSCOPY W/ STONE MANIPULATION    . ESOPHAGOGASTRODUODENOSCOPY N/A 07/16/2016   Procedure: ESOPHAGOGASTRODUODENOSCOPY (EGD);  Surgeon: Manus Gunning, MD;  Location: [place];  Service: Gastroenterology;  Laterality: N/A;  . ESOPHAGOGASTRODUODENOSCOPY N/A 10/08/2016   Procedure: ESOPHAGOGASTRODUODENOSCOPY (EGD);  Surgeon: Doran Stabler, MD;  Location: [facility] ENDOSCOPY;  Service: Endoscopy;  Laterality: N/A;  . ESOPHAGOGASTRODUODENOSCOPY (EGD) WITH PROPOFOL N/A 10/10/2018   Procedure: ESOPHAGOGASTRODUODENOSCOPY (EGD)  WITH PROPOFOL;  Surgeon: Rush Landmark Telford Nab., MD;  Location: [place];  Service: Gastroenterology;  Laterality: N/A;  . IR LUMBAR [place] W/IMG GUIDE  10/01/2018  . LUNG LOBECTOMY Right   . TEE WITHOUT CARDIOVERSION N/A 10/30/2016   Procedure: TRANSESOPHAGEAL ECHOCARDIOGRAM (TEE);  Surgeon: Fay Records, MD;  [facility] ENDOSCOPY;  Service: Cardiovascular;  Laterality: N/A;  . TEE WITHOUT CARDIOVERSION  10/10/2018   Procedure: TRANSESOPHAGEAL ECHOCARDIOGRAM (TEE);  Surgeon: Rush Landmark Telford Nab., MD;  Location: [facility] ENDOSCOPY;  Service: Gastroenterology;;    Family History  Problem Relation Age of Onset  . Dementia Mother     Social History:  reports that he has been smoking cigarettes. He has a 36.00 pack-year smoking history. He has never used smokeless tobacco. He reports current drug use. Drug: Heroin. He reports that he does not drink alcohol.  Allergies: No Known Allergies  Medications: I have reviewed the patient's current medications.  Results for orders placed or performed during the hospital encounter of 06/03/19 (from the past 48 hour(s))  SARS Coronavirus 2 (CEPHEID- Performed in [place] hospital lab), Hosp Order     Status: None   Collection Time: 06/03/19  3:56 PM   Specimen: Nasopharyngeal Swab  Result Value Ref Range   SARS Coronavirus 2 NEGATIVE NEGATIVE    Comment: (NOTE) If result is NEGATIVE SARS-CoV-2 target nucleic acids are NOT DETECTED. The SARS-CoV-2 RNA is generally detectable in upper and lower  respiratory specimens during the acute phase of infection. The lowest  concentration of SARS-CoV-2 viral copies this assay can detect is 250  copies / mL.  A negative result does not preclude SARS-CoV-2 infection  and should not be used as the sole basis for treatment or other  patient management decisions.  A negative result may occur with  improper specimen collection / handling, submission of specimen other  than nasopharyngeal swab,  presence of viral mutation(s) within the  areas targeted by this assay, and inadequate number of viral copies  (<250 copies / mL). A negative result must be combined with clinical  observations, patient history, and epidemiological information. If result is POSITIVE SARS-CoV-2 target nucleic acids are DETECTED. The SARS-CoV-2 RNA is generally detectable in upper and lower  respiratory specimens dur ing the acute phase of infection.  Positive  results are indicative of active infection with SARS-CoV-2.  Clinical  correlation with patient history and other diagnostic information is  necessary to determine patient infection status.  Positive results do  not rule out bacterial infection or co-infection with other viruses. If result is PRESUMPTIVE POSTIVE SARS-CoV-2 nucleic acids MAY BE PRESENT.   A presumptive positive result was obtained on the submitted specimen  and confirmed on repeat testing.  While 2019 novel coronavirus  (SARS-CoV-2) nucleic acids may be present in the submitted sample  additional confirmatory testing may be necessary for epidemiological  and / or clinical management purposes  to differentiate between  SARS-CoV-2 and other Sarbecovirus currently known to infect humans.  If clinically indicated additional testing with an alternate test  methodology [phone] is advised. The SARS-CoV-2 RNA is generally  detectable in upper and lower respiratory sp ecimens during the acute  phase of infection. The expected result is Negative. Fact Sheet for Patients:  BoilerBrush.com.cyhttps://www.fda.gov/media/136312/download Fact Sheet for Healthcare Providers: https://pope.com/https://www.fda.gov/media/136313/download This test is not yet approved or cleared by the [place] FDA and has been authorized for detection and/or diagnosis of SARS-CoV-2 by FDA under an Emergency Use Authorization (EUA).  This EUA will remain in effect (meaning this test can be used) for the duration of the COVID-19 declaration under  Section 564(b)(1) of the Act, 21 U.S.C. section 360bbb-3(b)(1), unless the authorization is terminated or revoked sooner. Performed at [facility], [address] [place], [place] [address]   CBC with Differential     Status: Abnormal   Collection Time: 06/03/19  4:00 PM  Result Value Ref Range   WBC 6.4 4.0 - 10.5 K/uL   RBC 4.02 (L) 4.22 - 5.81 MIL/uL   Hemoglobin 11.4 (L) 13.0 - 17.0 g/dL   HCT 84.135.1 (L) 32.439.0 - 40.152.0 %   MCV 87.3 80.0 - 100.0 fL   MCH 28.4 26.0 - 34.0 pg   MCHC 32.5 30.0 - 36.0 g/dL   RDW 02.714.1 25.311.5 - 66.415.5 %   Platelets 81 (L) 150 - 400 K/uL    Comment: REPEATED TO VERIFY PLATELET COUNT CONFIRMED BY SMEAR Immature Platelet Fraction may be clinically indicated, consider ordering this additional test [id number]    nRBC 0.0 0.0 - 0.2 %   Neutrophils Relative % 73 %   Neutro Abs 4.7 1.7 - 7.7 K/uL   Lymphocytes Relative 17 %   Lymphs Abs 1.1 0.7 - 4.0 K/uL   Monocytes Relative 8 %   Monocytes Absolute 0.5 0.1 - 1.0 K/uL   Eosinophils Relative 1 %   Eosinophils Absolute 0.0 0.0 - 0.5 K/uL   Basophils Relative 1 %   Basophils Absolute 0.0 0.0 - 0.1 K/uL   Immature Granulocytes 0 %   Abs Immature Granulocytes 0.02 0.00 - 0.07 K/uL  Comment: Performed at [facility], [address] [place], [place] [address]  Comprehensive metabolic panel     Status: Abnormal   Collection Time: 06/03/19  4:00 PM  Result Value Ref Range   Sodium 130 (L) 135 - 145 mmol/L   Potassium 3.9 3.5 - 5.1 mmol/L   Chloride 95 (L) 98 - 111 mmol/L   CO2 24 22 - 32 mmol/L   Glucose, Bld 86 70 - 99 mg/dL   BUN 18 6 - 20 mg/dL   Creatinine, Ser 8.290.70 0.61 - 1.24 mg/dL   Calcium 8.1 (L) 8.9 - 10.3 mg/dL   Total Protein 7.4 6.5 - 8.1 g/dL   Albumin 2.3 (L) 3.5 - 5.0 g/dL   AST 25 15 - 41 U/L   ALT 26 0 - 44 U/L   Alkaline Phosphatase 207 (H) 38 - 126 U/L   Total Bilirubin 0.7 0.3 - 1.2 mg/dL   GFR calc non Af Amer >60 >60 mL/min   GFR calc Af Amer >60 >60 mL/min   Anion  gap 11 5 - 15    Comment: Performed at [facility], [address] [place], [place] [address]  Troponin I - ONCE - STAT     Status: None   Collection Time: 06/03/19  4:00 PM  Result Value Ref Range   Troponin I <0.03 <0.03 ng/mL    Comment: Performed at [facility], [address] [place], [place] [address]  Urinalysis, Routine w reflex microscopic     Status: Abnormal   Collection Time: 06/03/19  6:40 PM  Result Value Ref Range   Color, Urine AMBER (A) YELLOW    Comment: BIOCHEMICALS MAY BE AFFECTED BY COLOR   APPearance CLEAR CLEAR   Specific Gravity, Urine 1.015 1.005 - 1.030   pH 6.0 5.0 - 8.0   Glucose, UA NEGATIVE NEGATIVE mg/dL   Hgb urine dipstick NEGATIVE NEGATIVE   Bilirubin Urine NEGATIVE NEGATIVE   Ketones, ur NEGATIVE NEGATIVE mg/dL   Protein, ur NEGATIVE NEGATIVE mg/dL   Nitrite NEGATIVE NEGATIVE   Leukocytes,Ua NEGATIVE NEGATIVE    Comment: Performed at [facility], [address] [place], [place] [address]    Mr Lumbar Spine W Wo Contrast  Result Date: 06/03/2019 CLINICAL DATA:  Back pain, cauda equina syndrome suspected. EXAM: MRI LUMBAR SPINE WITHOUT AND WITH CONTRAST TECHNIQUE: Multiplanar and multiecho pulse sequences of the lumbar spine were obtained without and with intravenous contrast. CONTRAST:  Gadavist 6 mL. COMPARISON:  09/30/2018 FINDINGS: Segmentation:  Standard. Alignment: Straightening, and reversal, of the normal lumbar lordosis. Vertebrae: T12-L1 diskitis with regional osteomyelitis as well as L3-L4 diskitis with regional osteomyelitis has healed. However, there is abnormal bone marrow edema at L5 and S1 on the RIGHT extending into the RIGHT sacral ala and SI joint. Conus medullaris and cauda equina: Conus extends to the L1 level level. Conus appears normal. There is relative stenosis at L3-4 due to chronic L3 retropulsion. Below this, there are space-occupying fluid collections in the epidural space, RIGHT greater than LEFT  extending into the sacral canal consistent with epidural abscesses. The show peripheral enhancement. See series 11, image 30 where a RIGHT-sided collection measures 8 x 17 mm cross-section. A LEFT-sided collection at this level is slightly smaller. Paraspinal and other soft tissues: Paravertebral abscesses are seen dorsal to the RIGHT L5-S1 facet, roughly 1 cm in size, and are indicative of septic facet arthritis. Disc levels: L1-L2: Central and leftward protrusion. Mild stenosis  but no impingement. L2-L3:  Unremarkable. L3-L4: Healed discitis and osteomyelitis. L3 retropulsion. Moderate stenosis. BILATERAL subarticular zone narrowing. L4-L5: Disc space narrowing. Central protrusion. Posterior element hypertrophy. Moderate stenosis. BILATERAL L5 neural impingement likely. L5-S1: Advanced disc space narrowing. Central protrusion. Trefoil appearance to the thecal sac due to BILATERAL epidural abscesses, severe compression. Significant subarticular zone and foraminal zone narrowing affecting the L5 and S1 nerve roots. Abnormal edema of L5 on the RIGHT and S1 on the RIGHT reflecting septic facet joint and septic sacroiliac joint. Enhancing abscesses extend into the sacral canal, greater on the RIGHT. IMPRESSION: T12-L1 and L3-4 discitis with regional osteomyelitis has essentially resolved. Moderate stenosis at L3-4 related to retropulsion of L3. The patient has developed septic arthritis of the RIGHT L5-S1 facet joint, and septic arthritis of the RIGHT sacroiliac joint. Osteomyelitis of the RIGHT sacrum and RIGHT L5 pedicle are noted. Epidural abscesses in the lower lumbar and upper sacral canal, resulting in severe compression of the thecal sac and cauda equina nerve roots. Depending on the patient's neurologic status, neurosurgical consultation may be warranted. These results were called by telephone at the time of interpretation on 06/03/2019 at 9:36 pm to Dr. Alvira MondayERIN SCHLOSSMAN , who verbally acknowledged these results.  Electronically Signed   By: Elsie StainJohn T Curnes M.D.   On: 06/03/2019 21:41   Dg Chest Portable 1 View  Result Date: 06/03/2019 CLINICAL DATA:  Chest pain and cough EXAM: PORTABLE CHEST 1 VIEW COMPARISON:  09/29/2018 FINDINGS: [place] and scarring in the right lung apex. No acute consolidation or effusion. Normal cardiomediastinal silhouette. No pneumothorax. IMPRESSION: No active disease.  Stable scarring in the right lung apex. Electronically Signed   By: Jasmine PangKim  Fujinaga M.D.   On: 06/03/2019 16:30    Pertinent items noted in HPI and remainder of comprehensive ROS otherwise negative. Blood pressure 108/70, pulse 70, temperature 98 F (36.7 C), temperature source Oral, resp. rate 15, height 5\' 11"  (1.803 m), weight 68 kg, SpO2 99 %. Patient is awake and alert.  He is oriented and reasonably appropriate.  His speech is fluent.  His judgment insight appear intact.  Cranial nerve function normal bilateral.  Motor examination reveals intact motor strength in both upper and lower extremities.  Sensory examination with some perianal sensory loss worse on the right side.  Rectal tone is somewhat diminished.  Examination head ears eyes nose and throat is unremarked.  Chest and abdomen are benign.  Extremities are free from injury or deformity.  Assessment/Plan: Lumbar epidural abscess with severe cauda equina compression and urinary retention with some sacral loss.  I discussed situation with patient.  Given the degree of compression in his lower lumbar and sacral spine I think that we need to move forward with emergent decompression with evacuation of epidural abscess in hopes of preserving neurologic function.  Plan for intraoperative cultures to be taken.  I discussed the risks and benefits involved with the surgery with the patient.  He is aware.  He wishes to proceed.  Sherilyn CooterHenry A Clarice Zulauf 06/04/2019, 12:40 AM

## 2019-06-04 NOTE — Progress Notes (Signed)
Pt declined a call to a family member for updates.

## 2019-06-04 NOTE — ED Notes (Signed)
Neuro surgery MD at the bedside.

## 2019-06-04 NOTE — Anesthesia Preprocedure Evaluation (Signed)
Anesthesia Evaluation  Patient identified by MRN, date of birth, ID band Patient awake    Reviewed: Allergy & Precautions, H&P , NPO status , Patient's Chart, lab work & pertinent test results  Airway Mallampati: II   Neck ROM: full    Dental   Pulmonary Current Smoker,    breath sounds clear to auscultation       Cardiovascular negative cardio ROS   Rhythm:regular Rate:Normal     Neuro/Psych PSYCHIATRIC DISORDERS Anxiety Depression Lumbar epidural abscess  Neuromuscular disease    GI/Hepatic PUD, (+) Hepatitis -, C, B  Endo/Other    Renal/GU      Musculoskeletal  (+) Arthritis ,   Abdominal   Peds  Hematology   Anesthesia Other Findings   Reproductive/Obstetrics                             Anesthesia Physical Anesthesia Plan  ASA: II  Anesthesia Plan: General   Post-op Pain Management:    Induction: Intravenous  PONV Risk Score and Plan: 1 and Ondansetron, Dexamethasone, Midazolam and Treatment may vary due to age or medical condition  Airway Management Planned: Oral ETT  Additional Equipment:   Intra-op Plan:   Post-operative Plan: Extubation in OR  Informed Consent: I have reviewed the patients History and Physical, chart, labs and discussed the procedure including the risks, benefits and alternatives for the proposed anesthesia with the patient or authorized representative who has indicated his/her understanding and acceptance.       Plan Discussed with: CRNA, Anesthesiologist and Surgeon  Anesthesia Plan Comments:         Anesthesia Quick Evaluation

## 2019-06-04 NOTE — Transfer of Care (Signed)
Immediate Anesthesia Transfer of Care Note  Patient: Dan [name]  Procedure(s) Performed: LUMBAR LAMINECTOMY FOR EPIDURAL ABSCESS, L5-S1 (N/A Back)  Patient Location: PACU  Anesthesia Type:General  Level of Consciousness: oriented, drowsy, patient cooperative and responds to stimulation  Airway & Oxygen Therapy: Patient Spontanous Breathing and Patient connected to nasal cannula oxygen  Post-op Assessment: Report given to RN, Post -op Vital signs reviewed and stable and Patient moving all extremities X 4  Post vital signs: Reviewed and stable  Last Vitals:  Vitals Value Taken Time  BP 102/90 06/04/19 0309  Temp    Pulse 78 06/04/19 0315  Resp 17 06/04/19 0315  SpO2 100 % 06/04/19 0315  Vitals shown include unvalidated device data.  Last Pain:  Vitals:   06/03/19 1503  TempSrc:   PainSc: 8          Complications: No apparent anesthesia complications

## 2019-06-04 NOTE — Progress Notes (Signed)
In Progress  06/04/19 8:46 PM [place], Dan Roller, RN  Consulted to check IV line. IV to the left upper arm is working fine. Would consider PICC line placement for long term vancomycin treatment.

## 2019-06-05 ENCOUNTER — Encounter (HOSPITAL_COMMUNITY): Payer: Self-pay | Admitting: Neurosurgery

## 2019-06-05 DIAGNOSIS — K766 Portal hypertension: Secondary | ICD-10-CM

## 2019-06-05 DIAGNOSIS — M4627 Osteomyelitis of vertebra, lumbosacral region: Secondary | ICD-10-CM

## 2019-06-05 DIAGNOSIS — Z978 Presence of other specified devices: Secondary | ICD-10-CM

## 2019-06-05 DIAGNOSIS — M4645 Discitis, unspecified, thoracolumbar region: Secondary | ICD-10-CM

## 2019-06-05 DIAGNOSIS — G061 Intraspinal abscess and granuloma: Principal | ICD-10-CM

## 2019-06-05 DIAGNOSIS — Z720 Tobacco use: Secondary | ICD-10-CM

## 2019-06-05 DIAGNOSIS — Z8739 Personal history of other diseases of the musculoskeletal system and connective tissue: Secondary | ICD-10-CM

## 2019-06-05 DIAGNOSIS — K0889 Other specified disorders of teeth and supporting structures: Secondary | ICD-10-CM

## 2019-06-05 DIAGNOSIS — B182 Chronic viral hepatitis C: Secondary | ICD-10-CM

## 2019-06-05 DIAGNOSIS — K7460 Unspecified cirrhosis of liver: Secondary | ICD-10-CM

## 2019-06-05 DIAGNOSIS — B181 Chronic viral hepatitis B without delta-agent: Secondary | ICD-10-CM

## 2019-06-05 LAB — CBC
HCT: 31.4 % — ABNORMAL LOW (ref 39.0–52.0)
Hemoglobin: 10 g/dL — ABNORMAL LOW (ref 13.0–17.0)
MCH: 28.1 pg (ref 26.0–34.0)
MCHC: 31.8 g/dL (ref 30.0–36.0)
MCV: 88.2 fL (ref 80.0–100.0)
Platelets: 74 10*3/uL — ABNORMAL LOW (ref 150–400)
RBC: 3.56 MIL/uL — ABNORMAL LOW (ref 4.22–5.81)
RDW: 13.7 % (ref 11.5–15.5)
WBC: 3.9 10*3/uL — ABNORMAL LOW (ref 4.0–10.5)
nRBC: 0 % (ref 0.0–0.2)

## 2019-06-05 LAB — BASIC METABOLIC PANEL
Anion gap: 5 (ref 5–15)
BUN: 21 mg/dL — ABNORMAL HIGH (ref 6–20)
CO2: 25 mmol/L (ref 22–32)
Calcium: 7.5 mg/dL — ABNORMAL LOW (ref 8.9–10.3)
Chloride: 102 mmol/L (ref 98–111)
Creatinine, Ser: 0.83 mg/dL (ref 0.61–1.24)
GFR calc Af Amer: >60 mL/min (ref >60)
GFR calc non Af Amer: >60 mL/min (ref >60)
Glucose, Bld: 173 mg/dL — ABNORMAL HIGH (ref 70–99)
Potassium: 3.7 mmol/L (ref 3.5–5.1)
Sodium: 132 mmol/L — ABNORMAL LOW (ref 135–145)

## 2019-06-05 MED ORDER — VEMLIDY 25 MG PO TABS: 1.0000 | ORAL_TABLET | Freq: Every day | ORAL | 3 refills | Status: AC

## 2019-06-05 NOTE — Anesthesia Postprocedure Evaluation (Signed)
Anesthesia Post Note  Patient: Dan [name]  Procedure(s) Performed: LUMBAR LAMINECTOMY FOR EPIDURAL ABSCESS, L5-S1 (N/A Back)     Patient location during evaluation: PACU Anesthesia Type: General Level of consciousness: awake and alert Pain management: pain level controlled Vital Signs Assessment: post-procedure vital signs reviewed and stable Respiratory status: spontaneous breathing, nonlabored ventilation, respiratory function stable and patient connected to nasal cannula oxygen Cardiovascular status: blood pressure returned to baseline and stable Postop Assessment: no apparent nausea or vomiting Anesthetic complications: no    Last Vitals:  Vitals:   06/04/19 2123 06/05/19 0605  BP: 113/81 120/78  Pulse: 74 76  Resp: 18 18  Temp: 36.9 C 36.9 C  SpO2: 97% 97%    Last Pain:  Vitals:   06/05/19 0621  TempSrc:   PainSc: [place]

## 2019-06-05 NOTE — Progress Notes (Signed)
Physical Therapy Treatment Patient Details Name: Dan [name] MRN: [id number] DOB: Dec 05, 1971 Today's Date: 06/05/2019    History of Present Illness 48 y.o. male with medical history significant of anxiety, chronic low back pain, liver cirrhosis, depression, hep B, hep C, polysubstance abuse presenting with a chief complaint of generalized body aches. MRI findings included EPIDURAL ABSCESS, LUMBAR. Pt is now s/p LUMBAR LAMINECTOMY FOR EPIDURAL ABSCESS, L5-S1.    PT Comments    Pt seen for functional mobility and gait trial. He required some encouragement to participate. Pt requiring less assistance for bed mobility and transfers; min guard for safety.  Pt able to ambulate ~130 ft with min guard. Pt unable to tolerate longer session due to elevated pain level. Pt progressing towards goals and will benefit from continued PT intervention.  Will continue to follow acutely.    Follow Up Recommendations  SNF     Equipment Recommendations  Other (comment)(TBD at next venue)    Recommendations for Other Services       Precautions / Restrictions Precautions Precautions: Fall Required Braces or Orthoses: (no brace needed) Restrictions Weight Bearing Restrictions: No    Mobility  Bed Mobility Overal bed mobility: Needs Assistance Bed Mobility: Sit to Supine       Sit to supine: Min guard;HOB elevated(cues for log roll; incresed time )      Transfers Overall transfer level: Needs assistance Equipment used: None Transfers: Sit to/from Stand Sit to Stand: Supervision;Min guard         General transfer comment: Pt somewhat impulsive; cues for safety.   Ambulation/Gait Ambulation/Gait assistance: Min guard Gait Distance (Feet): 130 Feet Assistive device: IV Pole Gait Pattern/deviations: Step-through pattern;Decreased stride length;Narrow base of support     General Gait Details: Pt ambulated quickly during gait trial as he was interested to return back to bed.  Pt reported  improved pain after walk.      Balance Overall balance assessment: Needs assistance Sitting-balance support: Feet supported;No upper extremity supported Sitting balance-Leahy Scale: Good     Standing balance support: Single extremity supported Standing balance-Leahy Scale: Fair                              Cognition Arousal/Alertness: Awake/alert Behavior During Therapy: Anxious Overall Cognitive Status: Within Functional Limits for tasks assessed                                        Exercises General Exercises - Lower Extremity Ankle Circles/Pumps: Both;10 reps Quad Sets: Both;10 reps Hip ABduction/ADduction: AROM;Left;Right;10 reps;Supine Other Exercises Other Exercises: transverse abdominus isometric x 5 reps of 5 sec     General Comments General comments (skin integrity, edema, etc.): saO2 on RA 97%      Pertinent Vitals/Pain Pain Assessment: 0-10 Pain Score: 8  Pain Location: back and radiating into BLE Pain Descriptors / Indicators: Operative site guarding;Grimacing;Moaning Pain Intervention(s): Limited activity within patient's tolerance;Monitored during session;Premedicated before session    Home Living                      Prior Function            PT Goals (current goals can now be found in the care plan section) Acute Rehab PT Goals Patient Stated Goal: rehab at SNF then home PT Goal Formulation: With patient Time  For Goal Achievement: 06/18/19 Potential to Achieve Goals: Good Progress towards PT goals: Progressing toward goals    Frequency    Min 3X/week      PT Plan Discharge plan needs to be updated    Co-evaluation PT/OT/SLP Co-Evaluation/Treatment: Yes   PT goals addressed during session: Mobility/safety with mobility        AM-PAC PT "6 Clicks" Mobility   Outcome Measure  Help needed turning from your back to your side while in a flat bed without using bedrails?: None Help needed moving  from lying on your back to sitting on the side of a flat bed without using bedrails?: None Help needed moving to and from a bed to a chair (including a wheelchair)?: A Little Help needed standing up from a chair using your arms (e.g., wheelchair or bedside chair)?: A Little Help needed to walk in hospital room?: A Little Help needed climbing 3-5 steps with a railing? : Total 6 Click Score: 18    End of Session Equipment Utilized During Treatment: Gait belt Activity Tolerance: Patient tolerated treatment well Patient left: in bed;with call bell/phone within reach;with bed alarm set;with SCD's reapplied Nurse Communication: Mobility status PT Visit Diagnosis: Unsteadiness on feet (R26.81);Other abnormalities of gait and mobility (R26.89);Muscle weakness (generalized) (M62.81);Difficulty in walking, not elsewhere classified (R26.2);Other symptoms and signs involving the nervous system (R29.898)     Time: 1359-1415 PT Time Calculation (min) (ACUTE ONLY): 16 min  Charges:  $Gait Training: 8-22 mins            Mayer CamelJennifer Carlson-Long, PTA Acute Rehab [phone]    Salvadore OxfordCarlson-Long, Shiana Rappleye L 06/05/2019, 2:42 PM

## 2019-06-05 NOTE — Progress Notes (Signed)
Postop day 1.  Patient complains of back and incisional pain.  No radiating pain.  No new numbness or paresthesias.  Foley catheter remains in place.  Afebrile.  Vital signs are stable.  Urine output good.  Drain output low.  Patient awake and alert.  He is oriented and appropriate.  Motor and sensory examination stable.  Wound clean and dry.  Overall progressing reasonably well following emergent decompressive laminectomy and evacuation of epidural abscess.  Gram stain with gram-positive cocci.  Cultures pending.  Continue antibiotics.  Continue drain for at least 1 more day.  Mobilize without restriction.

## 2019-06-05 NOTE — Consult Note (Signed)
[facility]    Date of Admission:  06/03/2019     Total days of antibiotics 3               Reason for Consult: Osteomyelitis   Referring Provider: Allena KatzPatel  Primary Care Provider: Dema SeverinYork, [name], NP   Assessment/Plan:  [name] is a 48 y/o male admitted generalized body aches and decreased bowel/bladder function found to have new sacral and right L5 pedicle osteomyelitis complicated with cauda equina compression requiring emergent decompression. 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  Sacral / L5 pedicle osteomyelitis - POD #1 with return of bladder function. Hemodynamically stable and afebrile. Blood cultures remain without growth to date. Surgical specimens from the abscess with gram stain showing gram positive cocci and cultures positive for Staphylococcus aureus with sensitivities pending. Will discontinue ceftriaxone and continue vancomycin with further narrowing as able. Wound care per neurosurgery.   Hepatitis B - E antigen positive Hepatitis B at last interval check and previously started on Vemlidy during his last hospitalization due to cirrhosis. Stopped taking medication due to lack of follow up. Recheck Hepatitis B blood work to determine staging. Restarted on [facility]idy. [facility].  Hepatitis C - Last viral load of 36,000 in October 2019. Has not been treated to date. Plan for outpatient treatment with follow up.  Cirrhosis -  CT October 2019 with Cirrhosis with portal hypertension. Likely decompensated. Recommend GI consult/follow up at some point. Platelets are down to 81,000 with AST 25 / ALT 26.   Tobacco use - Continues to smoke about 1 pack per day on average. Currently with nicotine patch. Continue treatment per primary team.    Principal Problem:   Cauda equina compression (HCC) Active Problems:   Chronic  hepatitis B (HCC)   Acute urinary retention   Epidural abscess   Septic arthritis (HCC)   Osteomyelitis (HCC)    feeding supplement (ENSURE ENLIVE)  237 mL Oral TID BM   multivitamin with minerals  1 tablet Oral Daily   nicotine  21 mg Transdermal Daily   oxyCODONE  30 mg Oral Q12H   pantoprazole  40 mg Oral Daily   senna-docusate  1 tablet Oral BID   sodium chloride flush  3 mL Intravenous Q12H   Tenofovir Alafenamide Fumarate  1 tablet Oral Daily     HPI: Dan [name] is a 48 y.o. male with previous medical history of polysubstance abuse, Chronic Genotype 1A Hepatitis C, hepatitis B, liver cirrhosis, depression, and previous L3-L4 osteomyelitis/discitis s/p antibiotic treatment in October 2019 with levofloxacin for 6 weeks presenting to the ED with generalized body aches for 6-6 days prior to presentation and anuria for 2 days.   In the ED Dan [name] was afebrile and hemodynamically stable. Lab work significant from ALP 207, platelet count of 81,000 (chronically low), Sars-Cov-2 negative.  Inflammatory markers of CRP 12.6 and sed rate of 79.  Chest x-ray without active disease. MRI of the spine with resolution of T12-L1 and L3-L4 discitis with regional osteomyelitis and new onset septic arthritis of the right L5-S1 facet joint and right sacroiliac joint.  There is also a new right sacrum and right L5 pedicle osteomyelitis.  Epidural abscess is in the lower lumbar and upper sacral canal resulting in severe compression of the thecal sac and cauda equina nerve roots.  Neurosurgery was emergently consulted for  decompression with evacuation of the epidural abscess.  Upon elevating the ligamentum flavum pus was returned and sampled for culture.  The epidural abscesses were followed into the sacral canal.  Dan [name] was started on vancomycin and ceftriaxone post procedure.  Dan [name] has remained hemodynamically stable and afebrile since admission.  Surgical specimens with Gram stains showing gram-positive  cocci.  Dan [name] is on day 3 of vancomycin and ceftriaxone.  ID has been asked for antibiotic recommendations.  Dan [name] has chronic E antigen positive hepatitis B previously on tenofovir. No recent blood work is available for evaluation at the present time and Dan [name] has been restarted on his Tenofovir given his hepatic cirrhosis.   Having back pain today, otherwise able to urinate and feeling a little better today. Does have radiculopathy with pain in his lower extremities. Denies feelings of fevers, chills, or sweats. Continues to smoke about 1 pack per day.   Review of Systems: Review of Systems  Constitutional: Negative for chills, fever and weight loss.  Respiratory: Negative for cough, shortness of breath and wheezing.   Cardiovascular: Negative for chest pain and leg swelling.  Gastrointestinal: Negative for abdominal pain, constipation, diarrhea, nausea and vomiting.  Musculoskeletal: Positive for back pain.  Skin: Negative for rash.     Past Medical History:  Diagnosis Date   Anxiety    Chronic lower back pain    Chronic pain    Cirrhosis of liver (HCC)    Depression    Gastric ulceration    Hepatitis B    Hepatitis C    "never tx'd" (09/30/2018)   History of kidney stones    Polysubstance abuse ([place])    Upper GI bleed     Social History   Tobacco Use   Smoking status: Current Every Day Smoker    Packs/day: 1.00    Years: 36.00    Pack years: 36.00    Types: Cigarettes   Smokeless tobacco: Never Used  Substance Use Topics   Alcohol use: No   Drug use: Yes    Types: Heroin    Comment: 09/30/2018 "qd"    Family History  Problem Relation Age of Onset   Dementia Mother     No Known Allergies  OBJECTIVE: Blood pressure 118/76, pulse 84, temperature 98.1 F (36.7 C), temperature source Oral, resp. rate 18, height 5\' 11"  (1.803 m), weight 68 kg, SpO2 97 %.  Physical Exam Constitutional:      General: Dan [name] is not in acute distress.    Appearance:  Dan [name] is well-developed.  HENT:     Head:     Comments: Poor dentition Cardiovascular:     Rate and Rhythm: Normal rate and regular rhythm.     Heart sounds: Normal heart sounds.  Pulmonary:     Effort: Pulmonary effort is normal.     Breath sounds: Normal breath sounds.  Musculoskeletal:     Comments: Hemovac drain present.   Skin:    General: Skin is warm and dry.  Neurological:     Mental Status: Dan [name] is alert and oriented to person, place, and time.  Psychiatric:        Behavior: Behavior normal.        Thought Content: Thought content normal.        Judgment: Judgment normal.     Lab Results Lab Results  Component Value Date   WBC 3.9 (L) 06/05/2019   HGB 10.0 (L) 06/05/2019   HCT 31.4 (L) 06/05/2019   MCV 88.2 06/05/2019  PLT 74 (L) 06/05/2019    Lab Results  Component Value Date   CREATININE 0.83 06/05/2019   BUN 21 (H) 06/05/2019   NA 132 (L) 06/05/2019   K 3.7 06/05/2019   CL 102 06/05/2019   CO2 25 06/05/2019    Lab Results  Component Value Date   ALT 26 06/03/2019   AST 25 06/03/2019   ALKPHOS 207 (H) 06/03/2019   BILITOT 0.7 06/03/2019     Microbiology: Recent Results (from the past 240 hour(s))  SARS Coronavirus 2 (CEPHEID- Performed in [facility] hospital lab), Hosp Order     Status: None   Collection Time: 06/03/19  3:56 PM   Specimen: Nasopharyngeal Swab  Result Value Ref Range Status   SARS Coronavirus 2 NEGATIVE NEGATIVE Final    Comment: (NOTE) If result is NEGATIVE SARS-CoV-2 target nucleic acids are NOT DETECTED. The SARS-CoV-2 RNA is generally detectable in upper and lower  respiratory specimens during the acute phase of infection. The lowest  concentration of SARS-CoV-2 viral copies this assay can detect is 250  copies / mL. A negative result does not preclude SARS-CoV-2 infection  and should not be used as the sole basis for treatment or other  patient management decisions.  A negative result may occur with  improper specimen  collection / handling, submission of specimen other  than nasopharyngeal swab, presence of viral mutation(s) within the  areas targeted by this assay, and inadequate number of viral copies  (<250 copies / mL). A negative result must be combined with clinical  observations, patient history, and epidemiological information. If result is POSITIVE SARS-CoV-2 target nucleic acids are DETECTED. The SARS-CoV-2 RNA is generally detectable in upper and lower  respiratory specimens dur ing the acute phase of infection.  Positive  results are indicative of active infection with SARS-CoV-2.  Clinical  correlation with patient history and other diagnostic information is  necessary to determine patient infection status.  Positive results do  not rule out bacterial infection or co-infection with other viruses. If result is PRESUMPTIVE POSTIVE SARS-CoV-2 nucleic acids MAY BE PRESENT.   A presumptive positive result was obtained on the submitted specimen  and confirmed on repeat testing.  While 2019 novel coronavirus  (SARS-CoV-2) nucleic acids may be present in the submitted sample  additional confirmatory testing may be necessary for epidemiological  and / or clinical management purposes  to differentiate between  SARS-CoV-2 and other Sarbecovirus currently known to infect humans.  If clinically indicated additional testing with an alternate test  methodology [phone] is advised. The SARS-CoV-2 RNA is generally  detectable in upper and lower respiratory sp ecimens during the acute  phase of infection. The expected result is Negative. Fact Sheet for Patients:  BoilerBrush.com.cyhttps://www.fda.gov/media/136312/download Fact Sheet for Healthcare Providers: https://pope.com/https://www.fda.gov/media/136313/download This test is not yet approved or cleared by the [place] FDA and has been authorized for detection and/or diagnosis of SARS-CoV-2 by FDA under an Emergency Use Authorization (EUA).  This EUA will remain in effect  (meaning this test can be used) for the duration of the COVID-19 declaration under Section 564(b)(1) of the Act, 21 U.S.C. section 360bbb-3(b)(1), unless the authorization is terminated or revoked sooner. Performed at [facility], [address] [place], [place] [address]   Blood culture (routine x 2)     Status: None (Preliminary result)   Collection Time: 06/03/19  4:00 PM   Specimen: BLOOD RIGHT FOREARM  Result Value Ref Range Status   Specimen Description BLOOD RIGHT FOREARM  Final   Special Requests   Final    BOTTLES DRAWN AEROBIC AND ANAEROBIC Blood Culture adequate volume   Culture   Final    NO GROWTH 2 DAYS Performed at [facility], [address] [place], [place] [address]    Report Status PENDING  Incomplete  Blood culture (routine x 2)     Status: None (Preliminary result)   Collection Time: 06/03/19  4:59 PM   Specimen: BLOOD RIGHT FOREARM  Result Value Ref Range Status   Specimen Description BLOOD RIGHT FOREARM  Final   Special Requests AEROBIC BOTTLE ONLY Blood Culture adequate volume  Final   Culture   Final    NO GROWTH 2 DAYS Performed at [facility], [address] [place], [place] [address]    Report Status PENDING  Incomplete  Aerobic Culture (superficial specimen)     Status: None (Preliminary result)   Collection Time: 06/04/19  2:29 AM   Specimen: Abscess  Result Value Ref Range Status   Specimen Description ABSCESS  Final   Special Requests NONE  Final   Gram Stain   Final    RARE WBC PRESENT, PREDOMINANTLY PMN RARE GRAM POSITIVE COCCI Performed at [facility], [address] [place], [place] [address]    Culture ABUNDANT STAPHYLOCOCCUS AUREUS  Final   Report Status PENDING  Incomplete     Marcos EkeGreg Shell Yandow, NP [facility] [phone] Pager  06/05/2019  2:11 PM

## 2019-06-05 NOTE — Progress Notes (Signed)
[facility] Progress Note  Patient: Dan [name] NTZ:[id number]   PCP: Imagene Riches, NP DOB: 05/23/1971   DOA: 06/03/2019   DOS: 06/05/2019   Date of Service: the patient was seen and examined on 06/05/2019  Brief hospital course: Pt. with PMH of anxiety, chronic low back pain, liver cirrhosis, depression, hep B, hep C, polysubstance abuse presenting with a chief complaint of generalized body aches. MRI findings included EPIDURAL ABSCESS, LUMBAR. Pt is now s/p LUMBAR LAMINECTOMY FOR EPIDURAL ABSCESS, L5-S1. Currently further plan is Continue antibiotics and follow ID recommendation.  Subjective: Feeling better but still has pain.  No nausea no vomiting no fever no chills.  No chest pain abdominal pain.  Assessment and Plan: Lumbar epidural abscess with severe cauda equina compression and acute urinary retention Afebrile and no leukocytosis. No lower extremity weakness or paresthesias.  -Frequent neurochecks -Foley catheter placed -Patient was seen by neurosurgery and underwent emergent decompression with evacuation of epidural abscess. -Intraoperative cultures taken -Started on ceftriaxone and vancomycin -Pain management postop: Dilaudid PRN severe pain, OxyIR PRN, OxyContin and Toradol scheduled.  Reassess in a.m. and de-escalate if appropriate. -Continuous pulse ox -Blood culture x2 so far no growth -Fungal culture pending  Septic arthritis of the right L5-S1 facet joint and right SI joint, osteomyelitis of the right sacrum and right L5 pedicle Afebrile and no leukocytosis.  Hemodynamically stable.  ESR and CRP elevated. -Ceftriaxone and vancomycin. -Blood culture x2 pending -Fungal culture pending -ID will consult in a.m.  Mild hyponatremia Sodium 130.   -IV fluid hydration -Continue to monitor BMP  Elevated alkaline phosphatase Alkaline phosphatase 207.  Likely secondary to bone pathology/osteomyelitis.  History of polysubstance abuse -Check UDS  Tobacco use  -Counseled to quit -NicoDerm patch  Cirrhosis of the liver. Patient will require GI consult outpatient.  Hepatitis B and C. ID consulting. We will follow-up on recommendation.  Diet: Regular diet DVT Prophylaxis: Subcutaneous Heparin   Advance goals of care discussion: full code  Family Communication: no family was present at bedside, at the time of interview.   Disposition:  Will need to stay in the hospital versus SNF to complete the IV antibiotics given history of IVDA.  Consultants: Infectious disease, neurosurgery, orthopedics Procedures: L5-S1 decompressive laminectomy with evacuation of epidural abscess  Scheduled Meds: . feeding supplement (ENSURE ENLIVE)  237 mL Oral TID BM  . multivitamin with minerals  1 tablet Oral Daily  . nicotine  21 mg Transdermal Daily  . oxyCODONE  30 mg Oral Q12H  . pantoprazole  40 mg Oral Daily  . senna-docusate  1 tablet Oral BID  . sodium chloride flush  3 mL Intravenous Q12H  . Tenofovir Alafenamide Fumarate  1 tablet Oral Daily   Continuous Infusions: . vancomycin 1,000 mg (06/05/19 1010)   PRN Meds: acetaminophen **OR** acetaminophen, cyclobenzaprine, HYDROmorphone (DILAUDID) injection, menthol-cetylpyridinium **OR** phenol, ondansetron **OR** ondansetron (ZOFRAN) IV, oxyCODONE, sodium chloride flush, sodium chloride flush, zolpidem Antibiotics: Anti-infectives (From admission, onward)   Start     Dose/Rate Route Frequency Ordered Stop   06/05/19 0000  Tenofovir Alafenamide Fumarate (VEMLIDY) 25 MG TABS     1 tablet Oral Daily 06/05/19 1128     06/04/19 2200  cefTRIAXone (ROCEPHIN) 2 g in sodium chloride 0.9 % 100 mL IVPB  Status:  Discontinued     2 g 200 mL/hr over 30 Minutes Intravenous Every 24 hours 06/04/19 0421 06/05/19 1405   06/04/19 1100  vancomycin (VANCOCIN) IVPB 1000 mg/200 mL premix  1,000 mg 200 mL/hr over 60 Minutes Intravenous Every 8 hours 06/04/19 0437     06/04/19 1000  Tenofovir Alafenamide Fumarate  TABS 25 mg     1 tablet Oral Daily 06/04/19 0421     06/04/19 0222  bacitracin 50,000 Units in sodium chloride 0.9 % 500 mL irrigation  Status:  Discontinued       As needed 06/04/19 0222 06/04/19 0304   06/04/19 0145  vancomycin (VANCOCIN) IVPB 1000 mg/200 mL premix     1,000 mg 200 mL/hr over 60 Minutes Intravenous  Once 06/04/19 0133 06/04/19 0250   06/04/19 0145  cefTRIAXone (ROCEPHIN) 2 g in sodium chloride 0.9 % 100 mL IVPB  Status:  Discontinued     2 g 200 mL/hr over 30 Minutes Intravenous Every 12 hours 06/04/19 0133 06/04/19 0134   06/04/19 0145  cefTRIAXone (ROCEPHIN) 2 g in sodium chloride 0.9 % 100 mL IVPB     2 g 200 mL/hr over 30 Minutes Intravenous  Once 06/04/19 0134 06/04/19 0240   06/04/19 0134  vancomycin (VANCOCIN) 1-5 GM/200ML-% IVPB  Status:  Discontinued    Note to Pharmacy: Rejeana Brock   : cabinet override      06/04/19 0134 06/04/19 0140       Objective: Physical Exam: Vitals:   06/04/19 1735 06/04/19 2123 06/05/19 0605 06/05/19 1354  BP: 117/68 113/81 120/78 118/76  Pulse: 77 74 76 84  Resp: '15 18 18 18  ' Temp: 98.1 F (36.7 C) 98.5 F (36.9 C) 98.4 F (36.9 C) 98.1 F (36.7 C)  TempSrc: Oral Oral Oral Oral  SpO2: 98% 97% 97% 97%  Weight:      Height:        Intake/Output Summary (Last 24 hours) at 06/05/2019 1748 Last data filed at 06/05/2019 1742 Gross per 24 hour  Intake 820 ml  Output 1715 ml  Net -895 ml   Filed Weights   06/03/19 1452  Weight: 68 kg   General: alert and oriented to time, place, and person. Appear in moderate distress, affect appropriate Eyes: PERRL, Conjunctiva normal ENT: Oral Mucosa Clear, moist  Neck: no JVD, no Abnormal Mass Or lumps Cardiovascular: S1 and S2 Present, no Murmur, peripheral pulses symmetrical Respiratory: normal respiratory effort, Bilateral Air entry equal and Decreased, no use of accessory muscle, Clear to Auscultation, no Crackles, no wheezes Abdomen: Bowel Sound present, Soft and no  tenderness, no hernia Skin: no rashes  Extremities: no Pedal edema, no calf tenderness Neurologic: normal without focal findings, mental status, speech normal, alert and oriented x3, PERLA, Motor strength 5/5 and symmetric and sensation grossly normal to light touch Gait not checked due to patient safety concerns  Data Reviewed: CBC: Recent Labs  Lab 06/03/19 1600 06/04/19 0748 06/05/19 0608  WBC 6.4 4.4 3.9*  NEUTROABS 4.7  --   --   HGB 11.4* 11.9* 10.0*  HCT 35.1* 36.2* 31.4*  MCV 87.3 85.6 88.2  PLT 81* 71* 74*   Basic Metabolic Panel: Recent Labs  Lab 06/03/19 1600 06/05/19 0608  NA 130* 132*  K 3.9 3.7  CL 95* 102  CO2 24 25  GLUCOSE 86 173*  BUN 18 21*  CREATININE 0.70 0.83  CALCIUM 8.1* 7.5*    Liver Function Tests: Recent Labs  Lab 06/03/19 1600  AST 25  ALT 26  ALKPHOS 207*  BILITOT 0.7  PROT 7.4  ALBUMIN 2.3*   No results for input(s): LIPASE, AMYLASE in the last 168 hours. No results for input(s):  AMMONIA in the last 168 hours. Coagulation Profile: No results for input(s): INR, PROTIME in the last 168 hours. Cardiac Enzymes: Recent Labs  Lab 06/03/19 1600  TROPONINI <0.03   BNP (last 3 results) No results for input(s): PROBNP in the last 8760 hours. CBG: No results for input(s): GLUCAP in the last 168 hours. Studies: No results found.   Time spent: 35 minutes  Author: Berle Mull, MD [facility] 06/05/2019 5:48 PM  To reach On-call, see care teams to locate the attending and reach out to them via [email or web address]. If 7PM-7AM, please contact night-coverage If you still have difficulty reaching the attending provider, please page the [facility] (Director on Call) for [facility] on amion for assistance.

## 2019-06-05 NOTE — Social Work (Addendum)
CSW acknowledging consult for SNF placement. Will follow for therapy recommendations- pt refused to stand or ambulate with therapy; level of function unclear. Due to pt hx of iv substance use as well as his Medicaid placement if needed will be limited if we are able to find a SNF able to meet his needs.    Westley Hummer, MSW, [place] [employer] [phone]

## 2019-06-05 NOTE — Plan of Care (Signed)
  Problem: Nutrition: Goal: Adequate nutrition will be maintained Outcome: Progressing   Problem: Coping: Goal: Level of anxiety will decrease Outcome: Progressing   Problem: Elimination: Goal: Will not experience complications related to urinary retention Outcome: Progressing   Problem: Pain Managment: Goal: General experience of comfort will improve Outcome: Progressing   Problem: Safety: Goal: Ability to remain free from injury will improve Outcome: Progressing   

## 2019-06-06 DIAGNOSIS — G062 Extradural and subdural abscess, unspecified: Secondary | ICD-10-CM

## 2019-06-06 DIAGNOSIS — G834 Cauda equina syndrome: Secondary | ICD-10-CM

## 2019-06-06 DIAGNOSIS — B9562 Methicillin resistant Staphylococcus aureus infection as the cause of diseases classified elsewhere: Secondary | ICD-10-CM

## 2019-06-06 LAB — CBC
HCT: 33.2 % — ABNORMAL LOW (ref 39.0–52.0)
Hemoglobin: 10.6 g/dL — ABNORMAL LOW (ref 13.0–17.0)
MCH: 28 pg (ref 26.0–34.0)
MCHC: 31.9 g/dL (ref 30.0–36.0)
MCV: 87.6 fL (ref 80.0–100.0)
Platelets: 78 10*3/uL — ABNORMAL LOW (ref 150–400)
RBC: 3.79 MIL/uL — ABNORMAL LOW (ref 4.22–5.81)
RDW: 14 % (ref 11.5–15.5)
WBC: 4 10*3/uL (ref 4.0–10.5)
nRBC: 0 % (ref 0.0–0.2)

## 2019-06-06 LAB — BASIC METABOLIC PANEL
Anion gap: 7 (ref 5–15)
BUN: 16 mg/dL (ref 6–20)
CO2: 26 mmol/L (ref 22–32)
Calcium: 8.1 mg/dL — ABNORMAL LOW (ref 8.9–10.3)
Chloride: 100 mmol/L (ref 98–111)
Creatinine, Ser: 0.71 mg/dL (ref 0.61–1.24)
GFR calc Af Amer: >60 mL/min (ref >60)
GFR calc non Af Amer: >60 mL/min (ref >60)
Glucose, Bld: 138 mg/dL — ABNORMAL HIGH (ref 70–99)
Potassium: 4.3 mmol/L (ref 3.5–5.1)
Sodium: 133 mmol/L — ABNORMAL LOW (ref 135–145)

## 2019-06-06 LAB — AEROBIC CULTURE W GRAM STAIN (SUPERFICIAL SPECIMEN)

## 2019-06-06 LAB — VANCOMYCIN, RANDOM
Vancomycin Rm: 40
Vancomycin Rm: 22

## 2019-06-06 MED ORDER — OXYCODONE HCL 5 MG PO TABS
10.0000 mg | ORAL_TABLET | Freq: Four times a day (QID) | ORAL | Status: DC | PRN
  Administered 2019-06-06 – 2019-06-13 (×16): 10 mg via ORAL
  Filled 2019-06-06 (×16): qty 2

## 2019-06-06 MED ORDER — METHOCARBAMOL 500 MG PO TABS
500.0000 mg | ORAL_TABLET | Freq: Three times a day (TID) | ORAL | Status: DC
  Administered 2019-06-06 – 2019-06-18 (×37): 500 mg via ORAL
  Filled 2019-06-06 (×37): qty 1

## 2019-06-06 MED ORDER — VANCOMYCIN HCL IN DEXTROSE 1-5 GM/200ML-% IV SOLN
1000.0000 mg | Freq: Two times a day (BID) | INTRAVENOUS | Status: DC
  Administered 2019-06-06 – 2019-06-15 (×19): 1000 mg via INTRAVENOUS
  Filled 2019-06-06 (×21): qty 200

## 2019-06-06 MED ORDER — HYDROMORPHONE HCL 1 MG/ML IJ SOLN
0.5000 mg | INTRAMUSCULAR | Status: DC | PRN
  Administered 2019-06-06 – 2019-06-12 (×38): 0.5 mg via INTRAVENOUS
  Filled 2019-06-06 (×36): qty 1

## 2019-06-06 NOTE — Progress Notes (Signed)
Occupational Therapy Treatment Patient Details Name: Dan [name] MRN: [id number] DOB: Oct 15, 1971 Today's Date: 06/06/2019    History of present illness 48 y.o. male with medical history significant of anxiety, chronic low back pain, liver cirrhosis, depression, hep B, hep C, polysubstance abuse presenting with a chief complaint of generalized body aches. MRI findings included EPIDURAL ABSCESS, LUMBAR. Pt is now s/p LUMBAR LAMINECTOMY FOR EPIDURAL ABSCESS, L5-S1.   OT comments  Pt tolerated session well with verbal complaints of pain, however, session not limited due to pain. Pt received R sidelying in bed, agreeable to session. Independent in bed mobility and supervision in functional mobility to toilet for safety. Pt is observed to be impulsive requiring cues for safety as well as inattention towards lines and leads. Pt will benefit from continued acute therapy address deficits and to prepare for dc to SNF for continued rehab. OT will continue to follow acutely.    Follow Up Recommendations  SNF    Equipment Recommendations  Other (comment)(defer to next venue)    Recommendations for Other Services      Precautions / Restrictions Precautions Precautions: Fall Required Braces or Orthoses: (no brace needed) Restrictions Weight Bearing Restrictions: No       Mobility Bed Mobility Overal bed mobility: Needs Assistance Bed Mobility: Sidelying to Sit   Sidelying to sit: Independent   Sit to supine: (cues for log roll; incresed time )   General bed mobility comments: pt received lying on right side in bed. Independent to sit at EOB.   Transfers Overall transfer level: Needs assistance Equipment used: None Transfers: Sit to/from Stand Sit to Stand: Supervision         General transfer comment: Pt somewhat impulsive; cues for safety.     Balance Overall balance assessment: Needs assistance Sitting-balance support: Feet supported;No upper extremity supported Sitting  balance-Leahy Scale: Good     Standing balance support: No upper extremity supported Standing balance-Leahy Scale: Fair                             ADL either performed or assessed with clinical judgement   ADL Overall ADL's : Needs assistance/impaired Eating/Feeding: Set up;Sitting                       Toilet Transfer: Supervision/safety;Cueing for safety;Cueing for sequencing;Ambulation Toilet Transfer Details (indicate cue type and reason): cueing for safety due to impulsivity. Unaware of lines and leads.         Functional mobility during ADLs: Supervision/safety General ADL Comments: pt Independent in bed mobility to prepare for transfer to bathroom toilet. Supervision required for safety.     Vision       Perception     Praxis      Cognition Arousal/Alertness: Awake/alert Behavior During Therapy: Anxious Overall Cognitive Status: Within Functional Limits for tasks assessed                                 General Comments: Pt observed to be impulsive when instructed to engaged in ADL in restroom. Required cues for safety.        Exercises     Shoulder Instructions       General Comments      Pertinent Vitals/ Pain       Pain Assessment: 0-10 Pain Score: 10-Worst pain ever Pain Location: back and radiating into  BLE Pain Descriptors / Indicators: Operative site guarding;Grimacing;Moaning Pain Intervention(s): Monitored during session;Repositioned  Home Living                                          Prior Functioning/Environment              Frequency  Min 2X/week        Progress Toward Goals  OT Goals(current goals can now be found in the care plan section)  Progress towards OT goals: Progressing toward goals  Acute Rehab OT Goals Patient Stated Goal: rehab at SNF then home OT Goal Formulation: With patient Time For Goal Achievement: 06/18/19 Potential to Achieve Goals: Good ADL  Goals Pt Will Perform Lower Body Bathing: with modified independence;sit to/from stand Pt Will Perform Lower Body Dressing: with modified independence;sit to/from stand Pt Will Transfer to Toilet: with modified independence;ambulating Pt Will Perform Toileting - Clothing Manipulation and hygiene: with modified independence;sit to/from stand Pt Will Perform Tub/Shower Transfer: with supervision;ambulating;shower seat;rolling walker  Plan Discharge plan remains appropriate;Frequency remains appropriate    Co-evaluation                 AM-PAC OT "6 Clicks" Daily Activity     Outcome Measure   Help from another person eating meals?: None Help from another person taking care of personal grooming?: None Help from another person toileting, which includes using toliet, bedpan, or urinal?: A Little Help from another person bathing (including washing, rinsing, drying)?: A Little Help from another person to put on and taking off regular upper body clothing?: A Little Help from another person to put on and taking off regular lower body clothing?: A Little 6 Click Score: 20    End of Session Equipment Utilized During Treatment: Gait belt  OT Visit Diagnosis: Pain;Unsteadiness on feet (R26.81)   Activity Tolerance Patient tolerated treatment well(reports high pain but able to demonstrate functional mob.)   Patient Left in bed;with call bell/phone within reach;with bed alarm set   Nurse Communication Mobility status        Time: 1350-1404 OT Time Calculation (min): 14 min  Charges: OT General Charges $OT Visit: 1 Visit OT Treatments $Self Care/Home Management : 8-22 mins  Dan [name], Dan [name], Dan [name]  [facility]  [phone]    Marius Ditch 06/06/2019, 2:18 PM

## 2019-06-06 NOTE — Progress Notes (Signed)
[facility] Progress Note  Patient: Dan [name] XNT:[id number]   PCP: Imagene Riches, NP DOB: 02-Apr-1971   DOA: 06/03/2019   DOS: 06/06/2019   Date of Service: the patient was seen and examined on 06/06/2019  Brief hospital course: Pt. with PMH of anxiety, chronic low back pain, liver cirrhosis, depression, hep B, hep C, polysubstance abuse presenting with a chief complaint of generalized body aches. MRI findings included EPIDURAL ABSCESS, LUMBAR. Pt is now s/p LUMBAR LAMINECTOMY FOR EPIDURAL ABSCESS, L5-S1. Currently further plan is Continue antibiotics and follow ID recommendation.  Subjective: Continues to have back pain.  No nausea no vomiting no fever no chills.  Denies any constipation.  Assessment and Plan: Lumbar epidural abscess with severe cauda equina compression and acute urinary retention Afebrile and no leukocytosis. No lower extremity weakness or paresthesias.  -So far neurologically intact. -Foley catheter placed, duration per neurosurgery -Patient was seen by neurosurgery and underwent emergent decompression with evacuation of epidural abscess. -Intraoperative cultures taken -Started on ceftriaxone and vancomycin -Pain management postop: Dilaudid PRN severe pain, OxyIR PRN, OxyContin and Toradol scheduled.  -Reduce Dilaudid from 1 mg to 2.5 mg.  Recommended patient to rely more on oral Oxley's. Patient does not have any provider prescribing him long-term pain medication.  Verified via PDMP as well as patient himself. -Continuous pulse ox -Blood culture x2 so far no growth -Fungal culture pending -Cultures from the wound are growing MRSA.  Continue vancomycin per ID. Will probably require long-term antibiotic, due to history of IV drug abuse would not be a good candidate for home therapy.  Will require SNF level care versus prolonged hospitalization for continuation of antibiotic.  Septic arthritis of the right L5-S1 facet joint and right SI joint, osteomyelitis of  the right sacrum and right L5 pedicle Afebrile and no leukocytosis.  Hemodynamically stable.  ESR and CRP elevated. Secondary to MRSA.  Currently on IV vancomycin.  See above. -Blood culture x2 so far no growth  Mild hyponatremia -Continue to monitor BMP, Slowly getting better.  Elevated alkaline phosphatase Alkaline phosphatase 207.  Likely secondary to bone pathology/osteomyelitis.  History of polysubstance abuse UDS positive for benzodiazepine and opiates.,  Patient was taken for surgery prior to the UDS therefore results are not reliable.  Patient reports use of heroin prior to admission.  Tobacco use -Counseled to quit -NicoDerm patch  Cirrhosis of the liver. Patient will require GI consult outpatient.  Hepatitis B and C. ID consulting. We will follow-up on recommendation.  Diet: Regular diet DVT Prophylaxis: Subcutaneous Heparin   Advance goals of care discussion: full code  Family Communication: no family was present at bedside, at the time of interview.   Disposition:  Will need to stay in the hospital versus SNF to complete the IV antibiotics given history of IVDA.  Consultants: Infectious disease, neurosurgery, orthopedics Procedures: L5-S1 decompressive laminectomy with evacuation of epidural abscess  Scheduled Meds: . feeding supplement (ENSURE ENLIVE)  237 mL Oral TID BM  . methocarbamol  500 mg Oral TID  . multivitamin with minerals  1 tablet Oral Daily  . nicotine  21 mg Transdermal Daily  . oxyCODONE  30 mg Oral Q12H  . pantoprazole  40 mg Oral Daily  . senna-docusate  1 tablet Oral BID  . sodium chloride flush  3 mL Intravenous Q12H  . Tenofovir Alafenamide Fumarate  1 tablet Oral Daily   Continuous Infusions: . vancomycin     PRN Meds: acetaminophen **OR** acetaminophen, cyclobenzaprine, HYDROmorphone (DILAUDID) injection, menthol-cetylpyridinium **  OR** phenol, ondansetron **OR** ondansetron (ZOFRAN) IV, oxyCODONE, sodium chloride flush,  sodium chloride flush, zolpidem Antibiotics: Anti-infectives (From admission, onward)   Start     Dose/Rate Route Frequency Ordered Stop   06/06/19 2251  vancomycin (VANCOCIN) IVPB 1000 mg/200 mL premix     1,000 mg 200 mL/hr over 60 Minutes Intravenous Every 12 hours 06/06/19 1156     06/05/19 0000  Tenofovir Alafenamide Fumarate (VEMLIDY) 25 MG TABS     1 tablet Oral Daily 06/05/19 1128     06/04/19 2200  cefTRIAXone (ROCEPHIN) 2 g in sodium chloride 0.9 % 100 mL IVPB  Status:  Discontinued     2 g 200 mL/hr over 30 Minutes Intravenous Every 24 hours 06/04/19 0421 06/05/19 1405   06/04/19 1100  vancomycin (VANCOCIN) IVPB 1000 mg/200 mL premix  Status:  Discontinued     1,000 mg 200 mL/hr over 60 Minutes Intravenous Every 8 hours 06/04/19 0437 06/06/19 1156   06/04/19 1000  Tenofovir Alafenamide Fumarate TABS 25 mg     1 tablet Oral Daily 06/04/19 0421     06/04/19 0222  bacitracin 50,000 Units in sodium chloride 0.9 % 500 mL irrigation  Status:  Discontinued       As needed 06/04/19 0222 06/04/19 0304   06/04/19 0145  vancomycin (VANCOCIN) IVPB 1000 mg/200 mL premix     1,000 mg 200 mL/hr over 60 Minutes Intravenous  Once 06/04/19 0133 06/04/19 0250   06/04/19 0145  cefTRIAXone (ROCEPHIN) 2 g in sodium chloride 0.9 % 100 mL IVPB  Status:  Discontinued     2 g 200 mL/hr over 30 Minutes Intravenous Every 12 hours 06/04/19 0133 06/04/19 0134   06/04/19 0145  cefTRIAXone (ROCEPHIN) 2 g in sodium chloride 0.9 % 100 mL IVPB     2 g 200 mL/hr over 30 Minutes Intravenous  Once 06/04/19 0134 06/04/19 0240   06/04/19 0134  vancomycin (VANCOCIN) 1-5 GM/200ML-% IVPB  Status:  Discontinued    Note to Pharmacy: Rejeana Brock   : cabinet override      06/04/19 0134 06/04/19 0140       Objective: Physical Exam: Vitals:   06/05/19 1354 06/05/19 2104 06/06/19 0034 06/06/19 0551  BP: 118/76 101/62 106/76 135/81  Pulse: 84 100 91 89  Resp: _0 Temp: 98.1 F (36.7 C) 98.4 F (36.9 C)  98.1 F (36.7 C) 98 F (36.7 C)  TempSrc: Oral Oral Oral Oral  SpO2: 97% 97% 97% 99%  Weight:      Height:        Intake/Output Summary (Last 24 hours) at 06/06/2019 1721 Last data filed at 06/06/2019 1319 Gross per 24 hour  Intake -  Output 2790 ml  Net -2790 ml   Filed Weights   06/03/19 1452  Weight: 68 kg   General: alert and oriented to time, place, and person. Appear in moderate distress, affect appropriate Eyes: PERRL, Conjunctiva normal ENT: Oral Mucosa Clear, moist  Neck: no JVD, no Abnormal Mass Or lumps Cardiovascular: S1 and S2 Present, no Murmur, peripheral pulses symmetrical Respiratory: normal respiratory effort, Bilateral Air entry equal and Decreased, no use of accessory muscle, Clear to Auscultation, no Crackles, no wheezes Abdomen: Bowel Sound present, Soft and no tenderness, no hernia Skin: no rashes  Extremities: no Pedal edema, no calf tenderness Neurologic: normal without focal findings, mental status, speech normal, alert and oriented x3, PERLA, Motor strength 5/5 and symmetric and sensation grossly normal to light touch Gait not  checked due to patient safety concerns  Data Reviewed: CBC: Recent Labs  Lab 06/03/19 1600 06/04/19 0748 06/05/19 0608 06/06/19 0416  WBC 6.4 4.4 3.9* 4.0  NEUTROABS 4.7  --   --   --   HGB 11.4* 11.9* 10.0* 10.6*  HCT 35.1* 36.2* 31.4* 33.2*  MCV 87.3 85.6 88.2 87.6  PLT 81* 71* 74* 78*   Basic Metabolic Panel: Recent Labs  Lab 06/03/19 1600 06/05/19 0608 06/06/19 0416  NA 130* 132* 133*  K 3.9 3.7 4.3  CL 95* 102 100  CO2 _0 GLUCOSE 86 173* 138*  BUN 18 21* 16  CREATININE 0.70 0.83 0.71  CALCIUM 8.1* 7.5* 8.1*    Liver Function Tests: Recent Labs  Lab 06/03/19 1600  AST 25  ALT 26  ALKPHOS 207*  BILITOT 0.7  PROT 7.4  ALBUMIN 2.3*   No results for input(s): LIPASE, AMYLASE in the last 168 hours. No results for input(s): AMMONIA in the last 168 hours. Coagulation Profile: No results  for input(s): INR, PROTIME in the last 168 hours. Cardiac Enzymes: Recent Labs  Lab 06/03/19 1600  TROPONINI <0.03   BNP (last 3 results) No results for input(s): PROBNP in the last 8760 hours. CBG: No results for input(s): GLUCAP in the last 168 hours. Studies: No results found.   Time spent: 35 minutes  Author: Berle Mull, MD [facility] 06/06/2019 5:21 PM  To reach On-call, see care teams to locate the attending and reach out to them via [email or web address]. If 7PM-7AM, please contact night-coverage If you still have difficulty reaching the attending provider, please page the [facility] (Director on Call) for [facility] on amion for assistance.

## 2019-06-06 NOTE — Progress Notes (Signed)
Patient hemovac discontinued per orders. Patient tolerated procedure well, dressing applied to drain site. Will continue to monitor for remainder of shift.

## 2019-06-06 NOTE — Progress Notes (Signed)
   Providing Compassionate, Quality Care - Together   Subjective: Patient reports severe back and incisional pain, but keeps dozing off during assessment. He denies radicular symptoms. He reports he has been able to void without issue since surgery.  Objective: Vital signs in last 24 hours: Temp:  [98 F (36.7 C)-98.4 F (36.9 C)] 98 F (36.7 C) (06/23 0551) Pulse Rate:  [84-100] 89 (06/23 0551) Resp:  [18-20] 18 (06/23 0551) BP: (101-135)/(62-81) 135/81 (06/23 0551) SpO2:  [97 %-99 %] 99 % (06/23 0551)  Intake/Output from previous day: 06/22 0701 - 06/23 0700 In: 720 [P.O.:720] Out: 1815 [Urine:1750; Drains:65] Intake/Output this shift: Total I/O In: -  Out: 525 [Urine:525]  Alert and oriented x 4 PERRLA MAE, Strength and sensation intact Incision clean, dry, and intact Hemovac with 30 mL out overnight  Lab Results: Recent Labs    06/05/19 0608 06/06/19 0416  WBC 3.9* 4.0  HGB 10.0* 10.6*  HCT 31.4* 33.2*  PLT 74* 78*   BMET Recent Labs    06/05/19 0608 06/06/19 0416  NA 132* 133*  K 3.7 4.3  CL 102 100  CO2 25 26  GLUCOSE 173* 138*  BUN 21* 16  CREATININE 0.83 0.71  CALCIUM 7.5* 8.1*     Assessment/Plan: Patient is two days s/p L5-S1 decompressive laminectomy with evacuation of epidural abscess by Dr. Annette Stable.   LOS: 2 days    -Discontinue Hemovac drain -Continue to mobilize -Abx therapy per ID  Viona Gilmore, DNP, AGNP-C Nurse Practitioner  [facility] [place]. [address], [place], [place] [address] P: [phone]    F: [phone]  06/06/2019, 10:09 AM

## 2019-06-06 NOTE — Progress Notes (Signed)
[facility]  Date of Admission:  06/03/2019     Total days of antibiotics 4         ASSESSMENT/PLAN  [name] is a 48 y/o male admitted generalized body aches and decreased bowel/bladder function found to have new sacral and right L5 pedicle osteomyelitis complicated with cauda equina compression requiring emergent decompression. Cultures positive for MRSA with current antimicrobial regimen of vancomycin. Restarted on Vemlidy for Chronic Hepatitis B complicated by cirrhosis.  MRSA sacral / L5 pedicle osteomyelitis s/p decompression - POD #4. Cultures confirming MRSA. Will require prolonged antimicrobial therapy. Blood cultures remain without growth to date. Wound care per neurosurgery. Continue current dose of vancomycin.   Chronic Hepatitis B - Blood work / current status pending. Continue current dose of Vemlidy.  Therapeutic Drug monitoring - Renal function stable without evidence of nephrotoxicity. Continue monitoring AUC per pharmacy protocol.    Principal Problem:   Cauda equina compression (HCC) Active Problems:   Chronic hepatitis B (HCC)   Acute urinary retention   Epidural abscess   Septic arthritis (HCC)   Osteomyelitis ([place])   . feeding supplement (ENSURE ENLIVE)  237 mL Oral TID BM  . methocarbamol  500 mg Oral TID  . multivitamin with minerals  1 tablet Oral Daily  . nicotine  21 mg Transdermal Daily  . oxyCODONE  30 mg Oral Q12H  . pantoprazole  40 mg Oral Daily  . senna-docusate  1 tablet Oral BID  . sodium chloride flush  3 mL Intravenous Q12H  . Tenofovir Alafenamide Fumarate  1 tablet Oral Daily    SUBJECTIVE:  Afebrile and hemodynamically stable overnight with no leukocytosis. Cultures positive for MRSA. Continues to have back pain and unable to sleep last night.    No Known Allergies   Review of Systems: Review of Systems  Constitutional: Negative for chills, fever and weight loss.  Respiratory: Negative for  cough, shortness of breath and wheezing.   Cardiovascular: Negative for chest pain and leg swelling.  Gastrointestinal: Negative for abdominal pain, constipation, diarrhea, nausea and vomiting.  Musculoskeletal: Positive for back pain.  Skin: Negative for rash.      OBJECTIVE: Vitals:   06/05/19 1354 06/05/19 2104 06/06/19 0034 06/06/19 0551  BP: 118/76 101/62 106/76 135/81  Pulse: 84 100 91 89  Resp: 18 18 20 18   Temp: 98.1 F (36.7 C) 98.4 F (36.9 C) 98.1 F (36.7 C) 98 F (36.7 C)  TempSrc: Oral Oral Oral Oral  SpO2: 97% 97% 97% 99%  Weight:      Height:       Body mass index is 20.92 kg/m.  Physical Exam Constitutional:      General: He is not in acute distress.    Appearance: He is well-developed.     Comments: Lying in bed resting; pleasant.   Cardiovascular:     Rate and Rhythm: Normal rate and regular rhythm.     Heart sounds: Normal heart sounds.  Pulmonary:     Effort: Pulmonary effort is normal.     Breath sounds: Normal breath sounds.  Skin:    General: Skin is warm and dry.  Neurological:     Mental Status: He is alert and oriented to person, place, and time.  Psychiatric:        Mood and Affect: Mood normal.     Lab Results Lab Results  Component Value Date   WBC 4.0 06/06/2019   HGB 10.6 (L) 06/06/2019  HCT 33.2 (L) 06/06/2019   MCV 87.6 06/06/2019   PLT 78 (L) 06/06/2019    Lab Results  Component Value Date   CREATININE 0.71 06/06/2019   BUN 16 06/06/2019   NA 133 (L) 06/06/2019   K 4.3 06/06/2019   CL 100 06/06/2019   CO2 26 06/06/2019    Lab Results  Component Value Date   ALT 26 06/03/2019   AST 25 06/03/2019   ALKPHOS 207 (H) 06/03/2019   BILITOT 0.7 06/03/2019     Microbiology: Recent Results (from the past 240 hour(s))  SARS Coronavirus 2 (CEPHEID- Performed in [facility] hospital lab), Hosp Order     Status: None   Collection Time: 06/03/19  3:56 PM   Specimen: Nasopharyngeal Swab  Result Value Ref Range Status    SARS Coronavirus 2 NEGATIVE NEGATIVE Final    Comment: (NOTE) If result is NEGATIVE SARS-CoV-2 target nucleic acids are NOT DETECTED. The SARS-CoV-2 RNA is generally detectable in upper and lower  respiratory specimens during the acute phase of infection. The lowest  concentration of SARS-CoV-2 viral copies this assay can detect is 250  copies / mL. A negative result does not preclude SARS-CoV-2 infection  and should not be used as the sole basis for treatment or other  patient management decisions.  A negative result may occur with  improper specimen collection / handling, submission of specimen other  than nasopharyngeal swab, presence of viral mutation(s) within the  areas targeted by this assay, and inadequate number of viral copies  (<250 copies / mL). A negative result must be combined with clinical  observations, patient history, and epidemiological information. If result is POSITIVE SARS-CoV-2 target nucleic acids are DETECTED. The SARS-CoV-2 RNA is generally detectable in upper and lower  respiratory specimens dur ing the acute phase of infection.  Positive  results are indicative of active infection with SARS-CoV-2.  Clinical  correlation with patient history and other diagnostic information is  necessary to determine patient infection status.  Positive results do  not rule out bacterial infection or co-infection with other viruses. If result is PRESUMPTIVE POSTIVE SARS-CoV-2 nucleic acids MAY BE PRESENT.   A presumptive positive result was obtained on the submitted specimen  and confirmed on repeat testing.  While 2019 novel coronavirus  (SARS-CoV-2) nucleic acids may be present in the submitted sample  additional confirmatory testing may be necessary for epidemiological  and / or clinical management purposes  to differentiate between  SARS-CoV-2 and other Sarbecovirus currently known to infect humans.  If clinically indicated additional testing with an alternate test   methodology [phone]) is advised. The SARS-CoV-2 RNA is generally  detectable in upper and lower respiratory sp ecimens during the acute  phase of infection. The expected result is Negative. Fact Sheet for Patients:  BoilerBrush.com.cyhttps://www.fda.gov/media/136312/download Fact Sheet for Healthcare Providers: https://pope.com/https://www.fda.gov/media/136313/download This test is not yet approved or cleared by the [place] FDA and has been authorized for detection and/or diagnosis of SARS-CoV-2 by FDA under an Emergency Use Authorization (EUA).  This EUA will remain in effect (meaning this test can be used) for the duration of the COVID-19 declaration under Section 564(b)(1) of the Act, 21 U.S.C. section 360bbb-3(b)(1), unless the authorization is terminated or revoked sooner. Performed at [facility], [address] [place], [place] [address]   Blood culture (routine x 2)     Status: None (Preliminary result)   Collection Time: 06/03/19  4:00 PM   Specimen: BLOOD RIGHT FOREARM  Result Value  Ref Range Status   Specimen Description BLOOD RIGHT FOREARM  Final   Special Requests   Final    BOTTLES DRAWN AEROBIC AND ANAEROBIC Blood Culture adequate volume   Culture   Final    NO GROWTH 3 DAYS Performed at [facility], [address] [place], [place] [address]    Report Status PENDING  Incomplete  Blood culture (routine x 2)     Status: None (Preliminary result)   Collection Time: 06/03/19  4:59 PM   Specimen: BLOOD RIGHT FOREARM  Result Value Ref Range Status   Specimen Description BLOOD RIGHT FOREARM  Final   Special Requests AEROBIC BOTTLE ONLY Blood Culture adequate volume  Final   Culture   Final    NO GROWTH 3 DAYS Performed at [facility], [address] [place], [place] [address]    Report Status PENDING  Incomplete  Aerobic Culture (superficial specimen)     Status: None (Preliminary result)   Collection Time: 06/04/19  2:29 AM   Specimen: Abscess  Result Value Ref  Range Status   Specimen Description ABSCESS  Final   Special Requests NONE  Final   Gram Stain   Final    RARE WBC PRESENT, PREDOMINANTLY PMN RARE GRAM POSITIVE COCCI Performed at [facility], [address] [place], [place] [address]    Culture   Final    ABUNDANT METHICILLIN RESISTANT STAPHYLOCOCCUS AUREUS   Report Status PENDING  Incomplete   Organism ID, Bacteria METHICILLIN RESISTANT STAPHYLOCOCCUS AUREUS  Final      Susceptibility   Methicillin resistant staphylococcus aureus - MIC*    CIPROFLOXACIN >=8 RESISTANT Resistant     ERYTHROMYCIN >=8 RESISTANT Resistant     GENTAMICIN <=0.5 SENSITIVE Sensitive     OXACILLIN >=4 RESISTANT Resistant     TETRACYCLINE <=1 SENSITIVE Sensitive     VANCOMYCIN 1 SENSITIVE Sensitive     TRIMETH/SULFA <=10 SENSITIVE Sensitive     CLINDAMYCIN <=0.25 SENSITIVE Sensitive     RIFAMPIN <=0.5 SENSITIVE Sensitive     Inducible Clindamycin NEGATIVE Sensitive     * ABUNDANT METHICILLIN RESISTANT STAPHYLOCOCCUS AUREUS     Marcos EkeGreg , NP [facility] [phone] Pager  06/06/2019  2:31 PM

## 2019-06-06 NOTE — Progress Notes (Addendum)
Pharmacy Antibiotic Note  Dan [name] is a 48 y.o. male admitted on 06/03/2019 with epidural abscess.  Pharmacy has been consulted for Vancomycin dosing. Pt pos-op laminectomy with evacuation of abscess.   WBC WNL, Scr stable at 0.71. Currently supratherapeutic with calculated AUC 777.   Plan: Reduce vancomycin to 1000 mg IV q12h >>Estimated AUC: 517.8 Trend WBC, temp, renal function  Drug levels as indicated   Height: 5\' 11"  (180.3 cm) Weight: 150 lb (68 kg) IBW/kg (Calculated) : 75.3  Temp (24hrs), Avg:98.2 F (36.8 C), Min:98 F (36.7 C), Max:98.4 F (36.9 C)  Recent Labs  Lab 06/03/19 1600 06/04/19 0748 06/05/19 0608 06/06/19 0416 06/06/19 1038  WBC 6.4 4.4 3.9* 4.0  --   CREATININE 0.70  --  0.83 0.71  --   VANCORANDOM  --   --   --  40 22    Estimated Creatinine Clearance: 107.4 mL/min (by C-G formula based on SCr of 0.71 mg/dL).    No Known Allergies  Antibiotics this admission: 6/21 CTX >>6/22 6/21 Vanc >> 6/21 Tenofovir>>  Dose adjustments this admission: Vancomycin 1000mg  Q 8hr >> AUC 777, reduced to Vancomycin 1000mg  Q 12hr   Micro results: 6/21 AbscessCx: abundant MRSA 6/20 BCx: ngtd 6/20 Covid: neg  Azzie Roup D PGY1 Pharmacy Resident  Phone [phone] Please use AMION for clinical pharmacists numbers  06/06/2019      12:08 PM

## 2019-06-07 ENCOUNTER — Inpatient Hospital Stay: Payer: Self-pay

## 2019-06-07 DIAGNOSIS — R338 Other retention of urine: Secondary | ICD-10-CM

## 2019-06-07 LAB — BASIC METABOLIC PANEL
Anion gap: 6 (ref 5–15)
BUN: 13 mg/dL (ref 6–20)
CO2: 29 mmol/L (ref 22–32)
Calcium: 8 mg/dL — ABNORMAL LOW (ref 8.9–10.3)
Chloride: 97 mmol/L — ABNORMAL LOW (ref 98–111)
Creatinine, Ser: 0.74 mg/dL (ref 0.61–1.24)
GFR calc Af Amer: >60 mL/min (ref >60)
GFR calc non Af Amer: >60 mL/min (ref >60)
Glucose, Bld: 121 mg/dL — ABNORMAL HIGH (ref 70–99)
Potassium: 4.1 mmol/L (ref 3.5–5.1)
Sodium: 132 mmol/L — ABNORMAL LOW (ref 135–145)

## 2019-06-07 LAB — CBC
HCT: 30.9 % — ABNORMAL LOW (ref 39.0–52.0)
Hemoglobin: 10 g/dL — ABNORMAL LOW (ref 13.0–17.0)
MCH: 28.4 pg (ref 26.0–34.0)
MCHC: 32.4 g/dL (ref 30.0–36.0)
MCV: 87.8 fL (ref 80.0–100.0)
Platelets: 81 10*3/uL — ABNORMAL LOW (ref 150–400)
RBC: 3.52 MIL/uL — ABNORMAL LOW (ref 4.22–5.81)
RDW: 13.9 % (ref 11.5–15.5)
WBC: 3.9 10*3/uL — ABNORMAL LOW (ref 4.0–10.5)
nRBC: 0 % (ref 0.0–0.2)

## 2019-06-07 LAB — HEPATITIS B E ANTIGEN: Hep B E Ag: POSITIVE — AB

## 2019-06-07 LAB — HEPATITIS B CORE ANTIBODY, TOTAL: Hep B Core Total Ab: POSITIVE — AB

## 2019-06-07 LAB — HEPATITIS B E ANTIBODY: Hep B E Ab: NEGATIVE

## 2019-06-07 NOTE — Progress Notes (Signed)
PROGRESS NOTE    Dan [name]  WUJ:[id number] DOB: 01/20/1971 DOA: 06/03/2019 PCP: Dema SeverinYork, [name], NP   Brief Narrative: Dan [name] is a 48 y.o. male with a history of anxiety, chronic low back pain, liver cirrhosis, depression, hepatitis B/C, polysubstance abuse. He presented secondary to generalized body aches and found to have an epidural abscess with associated septic arthritis leading to severe cauda equina compression. He underwent disc laminectomy by neurosurgery and abscess is growing MRS. Started on Vancomycin IV for planned course of 8 weeks inpatient.   Assessment & Plan:   Principal Problem:   Cauda equina compression (HCC) Active Problems:   Chronic hepatitis B (HCC)   Acute urinary retention   Epidural abscess   Septic arthritis (HCC)   Osteomyelitis (HCC)   Lumbar epidural abscess Severe cauda equina compression Septic arthritis of right L5-S1 facet joint, right SI joint/sacrum Patient underwent urgent lumbar laminectomy on 6/21 per neurosurgery for evacuation of epidural abscess. Cultures from abscess obtained and are significant for MRSA.  Infectious disease consulted and recommends 8 weeks of IV vancomycin. Blood cultures from 6/20 have been no growth to date.  Likely related to patient's IV drug use. Neurologically stable. -Continue vancomycin IV -Continue OxyContin, oxycodone IR as needed, Dilaudid as needed.  Will need to de-escalate narcotic therapy for discharge when ready  Hyponatremia Mild. Stable.  Elevated alkaline phosphatase Thought secondary to bone pathology. No clinical symptoms of cholecystitis. Has a history of distended gallbladder from CT abdomen last year.  History of polysubstance abuse Heroin use as an outpatient  Tobacco abuse -Continue nicotine patch  Cirrhosis Hepatitis B/C Appears stable clinically. Outpatient follow-up with GI and ID -Continue Tonofovir -ID recommendations   DVT prophylaxis: Heparin subq Code Status:   Code  Status: Full Code Family Communication: None Disposition Plan: Discharge after 8 weeks of IV Vancomycin   Consultants:   Neurosurgery  Infectious disease  Procedures:   6/21: L5-S1 decompressive laminectomy with evacuation of epidural abscess  Antimicrobials:  Ceftriaxone (6/20>>6/21)  Vancomycin (6/20>>  Tenofovir (6/21>>   Subjective: Back pain.  Objective: Vitals:   06/06/19 0551 06/06/19 1741 06/06/19 2152 06/07/19 0538  BP: 135/81 119/63 119/73 103/68  Pulse: 89 (!) 103 94 82  Resp: 18 18    Temp: 98 F (36.7 C) 98.9 F (37.2 C) 98.2 F (36.8 C) 98.2 F (36.8 C)  TempSrc: Oral Oral Oral Oral  SpO2: 99% 100% 99% 98%  Weight:      Height:        Intake/Output Summary (Last 24 hours) at 06/07/2019 0844 Last data filed at 06/07/2019 0556 Gross per 24 hour  Intake 200 ml  Output 2200 ml  Net -2000 ml   Filed Weights   06/03/19 1452  Weight: 68 kg    Examination:  General exam: Appears calm and comfortable Respiratory system: Clear to auscultation. Respiratory effort normal. Cardiovascular system: S1 & S2 heard, RRR. No murmurs, rubs, gallops or clicks. Gastrointestinal system: Abdomen is nondistended, soft and nontender. No organomegaly or masses felt. Normal bowel sounds heard. Central nervous system: Alert and oriented. No focal neurological deficits. Musculoskeletal: No edema. No calf tenderness. Back with honeycomb dressing intact and clean. No hematoma or erythema noted. Skin: No cyanosis. No rashes Psychiatry: Judgement and insight appear normal. Mood & affect appropriate.     Data Reviewed: I have personally reviewed following labs and imaging studies  CBC: Recent Labs  Lab 06/03/19 1600 06/04/19 0748 06/05/19 0608 06/06/19 0416 06/07/19 0400  WBC 6.4 4.4 3.9* 4.0 3.9*  NEUTROABS 4.7  --   --   --   --   HGB 11.4* 11.9* 10.0* 10.6* 10.0*  HCT 35.1* 36.2* 31.4* 33.2* 30.9*  MCV 87.3 85.6 88.2 87.6 87.8  PLT 81* 71* 74* 78* 81*    Basic Metabolic Panel: Recent Labs  Lab 06/03/19 1600 06/05/19 0608 06/06/19 0416 06/07/19 0400  NA 130* 132* 133* 132*  K 3.9 3.7 4.3 4.1  CL 95* 102 100 97*  CO2 24 25 26 29   GLUCOSE 86 173* 138* 121*  BUN 18 21* 16 13  CREATININE 0.70 0.83 0.71 0.74  CALCIUM 8.1* 7.5* 8.1* 8.0*   GFR: Estimated Creatinine Clearance: 107.4 mL/min (by C-G formula based on SCr of 0.74 mg/dL). Liver Function Tests: Recent Labs  Lab 06/03/19 1600  AST 25  ALT 26  ALKPHOS 207*  BILITOT 0.7  PROT 7.4  ALBUMIN 2.3*   No results for input(s): LIPASE, AMYLASE in the last 168 hours. No results for input(s): AMMONIA in the last 168 hours. Coagulation Profile: No results for input(s): INR, PROTIME in the last 168 hours. Cardiac Enzymes: Recent Labs  Lab 06/03/19 1600  TROPONINI <0.03   BNP (last 3 results) No results for input(s): PROBNP in the last 8760 hours. HbA1C: No results for input(s): HGBA1C in the last 72 hours. CBG: No results for input(s): GLUCAP in the last 168 hours. Lipid Profile: No results for input(s): CHOL, HDL, LDLCALC, TRIG, CHOLHDL, LDLDIRECT in the last 72 hours. Thyroid Function Tests: No results for input(s): TSH, T4TOTAL, FREET4, T3FREE, THYROIDAB in the last 72 hours. Anemia Panel: No results for input(s): VITAMINB12, FOLATE, FERRITIN, TIBC, IRON, RETICCTPCT in the last 72 hours. Sepsis Labs: No results for input(s): PROCALCITON, LATICACIDVEN in the last 168 hours.  Recent Results (from the past 240 hour(s))  SARS Coronavirus 2 (CEPHEID- Performed in [facility] hospital lab), Hosp Order     Status: None   Collection Time: 06/03/19  3:56 PM   Specimen: Nasopharyngeal Swab  Result Value Ref Range Status   SARS Coronavirus 2 NEGATIVE NEGATIVE Final    Comment: (NOTE) If result is NEGATIVE SARS-CoV-2 target nucleic acids are NOT DETECTED. The SARS-CoV-2 RNA is generally detectable in upper and lower  respiratory specimens during the acute phase of  infection. The lowest  concentration of SARS-CoV-2 viral copies this assay can detect is 250  copies / mL. A negative result does not preclude SARS-CoV-2 infection  and should not be used as the sole basis for treatment or other  patient management decisions.  A negative result may occur with  improper specimen collection / handling, submission of specimen other  than nasopharyngeal swab, presence of viral mutation(s) within the  areas targeted by this assay, and inadequate number of viral copies  (<250 copies / mL). A negative result must be combined with clinical  observations, patient history, and epidemiological information. If result is POSITIVE SARS-CoV-2 target nucleic acids are DETECTED. The SARS-CoV-2 RNA is generally detectable in upper and lower  respiratory specimens dur ing the acute phase of infection.  Positive  results are indicative of active infection with SARS-CoV-2.  Clinical  correlation with patient history and other diagnostic information is  necessary to determine patient infection status.  Positive results do  not rule out bacterial infection or co-infection with other viruses. If result is PRESUMPTIVE POSTIVE SARS-CoV-2 nucleic acids MAY BE PRESENT.   A presumptive positive result was obtained on the submitted specimen  and confirmed on repeat testing.  While 2019 novel coronavirus  (SARS-CoV-2) nucleic acids may be present in the submitted sample  additional confirmatory testing may be necessary for epidemiological  and / or clinical management purposes  to differentiate between  SARS-CoV-2 and other Sarbecovirus currently known to infect humans.  If clinically indicated additional testing with an alternate test  methodology [phone]) is advised. The SARS-CoV-2 RNA is generally  detectable in upper and lower respiratory sp ecimens during the acute  phase of infection. The expected result is Negative. Fact Sheet for Patients:   StrictlyIdeas.no Fact Sheet for Healthcare Providers: BankingDealers.co.za This test is not yet approved or cleared by the [place] FDA and has been authorized for detection and/or diagnosis of SARS-CoV-2 by FDA under an Emergency Use Authorization (EUA).  This EUA will remain in effect (meaning this test can be used) for the duration of the COVID-19 declaration under Section 564(b)(1) of the Act, 21 U.S.C. section 360bbb-3(b)(1), unless the authorization is terminated or revoked sooner. Performed at [facility], [place] [address] [place], [place] [address]   Blood culture (routine x 2)     Status: None (Preliminary result)   Collection Time: 06/03/19  4:00 PM   Specimen: BLOOD RIGHT FOREARM  Result Value Ref Range Status   Specimen Description BLOOD RIGHT FOREARM  Final   Special Requests   Final    BOTTLES DRAWN AEROBIC AND ANAEROBIC Blood Culture adequate volume   Culture   Final    NO GROWTH 3 DAYS Performed at [facility], [place] [address], [place], [place] [address]    Report Status PENDING  Incomplete  Blood culture (routine x 2)     Status: None (Preliminary result)   Collection Time: 06/03/19  4:59 PM   Specimen: BLOOD RIGHT FOREARM  Result Value Ref Range Status   Specimen Description BLOOD RIGHT FOREARM  Final   Special Requests AEROBIC BOTTLE ONLY Blood Culture adequate volume  Final   Culture   Final    NO GROWTH 3 DAYS Performed at [facility], [place] [address] [place], [place] [address]    Report Status PENDING  Incomplete  Aerobic Culture (superficial specimen)     Status: None   Collection Time: 06/04/19  2:29 AM   Specimen: Abscess  Result Value Ref Range Status   Specimen Description ABSCESS  Final   Special Requests NONE  Final   Gram Stain   Final    RARE WBC PRESENT, PREDOMINANTLY PMN RARE GRAM POSITIVE COCCI Performed at [facility], [place] [address], [place], [place] [address]     Culture   Final    ABUNDANT METHICILLIN RESISTANT STAPHYLOCOCCUS AUREUS   Report Status 06/06/2019 FINAL  Final   Organism ID, Bacteria METHICILLIN RESISTANT STAPHYLOCOCCUS AUREUS  Final      Susceptibility   Methicillin resistant staphylococcus aureus - MIC*    CIPROFLOXACIN >=8 RESISTANT Resistant     ERYTHROMYCIN >=8 RESISTANT Resistant     GENTAMICIN <=0.5 SENSITIVE Sensitive     OXACILLIN >=4 RESISTANT Resistant     TETRACYCLINE <=1 SENSITIVE Sensitive     VANCOMYCIN 1 SENSITIVE Sensitive     TRIMETH/SULFA <=10 SENSITIVE Sensitive     CLINDAMYCIN <=0.25 SENSITIVE Sensitive     RIFAMPIN <=0.5 SENSITIVE Sensitive     Inducible Clindamycin NEGATIVE Sensitive     * ABUNDANT METHICILLIN RESISTANT STAPHYLOCOCCUS AUREUS         Radiology Studies: No results found.  Scheduled Meds: . feeding supplement (ENSURE ENLIVE)  237 mL Oral TID BM  . methocarbamol  500 mg Oral TID  . multivitamin with minerals  1 tablet Oral Daily  . nicotine  21 mg Transdermal Daily  . oxyCODONE  30 mg Oral Q12H  . pantoprazole  40 mg Oral Daily  . senna-docusate  1 tablet Oral BID  . sodium chloride flush  3 mL Intravenous Q12H  . Tenofovir Alafenamide Fumarate  1 tablet Oral Daily   Continuous Infusions: . vancomycin Stopped (06/06/19 2335)     LOS: 3 days     Dan Hawkingalph Dwane Andres, MD [facility] 06/07/2019, 8:44 AM  If 7PM-7AM, please contact night-coverage [email or web address]

## 2019-06-07 NOTE — Progress Notes (Signed)
IV Team called and stated PICC line would have to be placed tomorrow 06/08/2019 due to the order of which the order was received. Will continue to monitor.

## 2019-06-07 NOTE — Progress Notes (Addendum)
   Providing Compassionate, Quality Care - Together   Subjective: Patient reports bilateral knee pain and incisional pain. He also reports anxiety.  Objective: Vital signs in last 24 hours: Temp:  [98.2 F (36.8 C)-98.9 F (37.2 C)] 98.2 F (36.8 C) (06/24 0538) Pulse Rate:  [82-103] 82 (06/24 0538) Resp:  [18] 18 (06/23 1741) BP: (103-119)/(63-73) 103/68 (06/24 0538) SpO2:  [98 %-100 %] 98 % (06/24 0538)  Intake/Output from previous day: 06/23 0701 - 06/24 0700 In: 200 [IV Piggyback:200] Out: 2725 [Urine:2725] Intake/Output this shift: Total I/O In: 240 [P.O.:240] Out: 480 [Urine:480]  Alert and oriented x 4 PERRLA MAE, Strength and sensation intact Incision clean, dry, and intact; Honeycomb removed, steri strips in place Drain removed yesterday, site is without drainage  Lab Results: Recent Labs    06/06/19 0416 06/07/19 0400  WBC 4.0 3.9*  HGB 10.6* 10.0*  HCT 33.2* 30.9*  PLT 78* 81*   BMET Recent Labs    06/06/19 0416 06/07/19 0400  NA 133* 132*  K 4.3 4.1  CL 100 97*  CO2 26 29  GLUCOSE 138* 121*  BUN 16 13  CREATININE 0.71 0.74  CALCIUM 8.1* 8.0*    Studies/Results: No results found.  Assessment/Plan: Patient is three days s/p L5-S1 decompressive laminectomy with evacuation of epidural abscess by Dr. Annette Stable. Abscess culture has grown MRSA.   LOS: 3 days   -Continue to mobilize -Abx therapy per ID   Viona Gilmore, DNP, AGNP-C Nurse Practitioner  [facility] [place]. [address], [place], [place], [place] [address] P: [phone]    F: [phone]  06/07/2019, 11:59 AM

## 2019-06-07 NOTE — Progress Notes (Signed)
Physical Therapy Treatment Patient Details Name: Dan [name] MRN: [id number] DOB: 1971-05-02 Today's Date: 06/07/2019    History of Present Illness 48 y.o. male with medical history significant of anxiety, chronic low back pain, liver cirrhosis, depression, hep B, hep C, polysubstance abuse presenting with a chief complaint of generalized body aches. MRI findings included EPIDURAL ABSCESS, LUMBAR. Pt is now s/p LUMBAR LAMINECTOMY FOR EPIDURAL ABSCESS, L5-S1.    PT Comments    Continuing work on functional mobility and activity tolerance;  Noting overall excellent progress with functional mobility and activity tolerance; Reports he is very painful, but he is still walking the hallways overall quite well without an assistive device; noted need for SNF is more related to the need for management of IV antibiotics more than for physical rehabilitation; He will likely meet acute PT goals soon; Agree with continuing efforts at keeping his activity up with [employer].  Follow Up Recommendations  SNF     Equipment Recommendations  Other (comment)(No obvious need for DME at this time)    Recommendations for Other Services       Precautions / Restrictions Precautions Precautions: Fall Required Braces or Orthoses: (no brace needed)    Mobility  Bed Mobility Overal bed mobility: Needs Assistance Bed Mobility: Supine to Sit;Sit to Sidelying     Supine to sit: Supervision   Sit to sidelying: Supervision General bed mobility comments: Did not employ rolling and sidelie to sit technique to get up initially; performed sit to sidelying well  Transfers Overall transfer level: Needs assistance Equipment used: None Transfers: Sit to/from Stand Sit to Stand: Supervision         General transfer comment: Pt somewhat impulsive; cues for safety.   Ambulation/Gait Ambulation/Gait assistance: Min guard(without physical contact) Gait Distance (Feet): 130 Feet Assistive device: None Gait  Pattern/deviations: Step-through pattern;Decreased stride length;Narrow base of support     General Gait Details: Pt ambulated quickly during gait trial as he was interested to return back to bed.  Pt reported improved pain after walk.    Stairs         General stair comments: Declined stair training when offered   Wheelchair Mobility    Modified Rankin (Stroke Patients Only)       Balance             Standing balance-Leahy Scale: Fair(approaching Good)                              Cognition Arousal/Alertness: Awake/alert Behavior During Therapy: WFL for tasks assessed/performed;Impulsive Overall Cognitive Status: (for simple mobility)                                        Exercises      General Comments General comments (skin integrity, edema, etc.): No acute distress; told me he intends to ignore his birthday      Pertinent Vitals/Pain Pain Assessment: Faces Pain Score: 10-Worst pain ever Faces Pain Scale: Hurts little more Pain Location: back and radiating into BLE Pain Descriptors / Indicators: Discomfort;Grimacing Pain Intervention(s): Monitored during session    Home Living                      Prior Function            PT Goals (current goals can now be found  in the care plan section) Acute Rehab PT Goals Patient Stated Goal: did not state PT Goal Formulation: With patient Time For Goal Achievement: 06/18/19 Potential to Achieve Goals: Good Progress towards PT goals: Progressing toward goals    Frequency    Min 3X/week      PT Plan Current plan remains appropriate    Co-evaluation              AM-PAC PT "6 Clicks" Mobility   Outcome Measure  Help needed turning from your back to your side while in a flat bed without using bedrails?: None Help needed moving from lying on your back to sitting on the side of a flat bed without using bedrails?: None Help needed moving to and from a bed  to a chair (including a wheelchair)?: None Help needed standing up from a chair using your arms (e.g., wheelchair or bedside chair)?: None Help needed to walk in hospital room?: A Little Help needed climbing 3-5 steps with a railing? : A Little 6 Click Score: 22    End of Session   Activity Tolerance: Patient tolerated treatment well Patient left: in bed;with call bell/phone within reach Nurse Communication: Mobility status PT Visit Diagnosis: Unsteadiness on feet (R26.81);Other abnormalities of gait and mobility (R26.89);Muscle weakness (generalized) (M62.81);Difficulty in walking, not elsewhere classified (R26.2);Other symptoms and signs involving the nervous system (R29.898)     Time: 3664-4034 PT Time Calculation (min) (ACUTE ONLY): 11 min  Charges:  $Gait Training: 8-22 mins                     Roney Marion, [place] Pager [phone] Office [place] 06/07/2019, 1:55 PM

## 2019-06-07 NOTE — Progress Notes (Signed)
[facility]  Date of Admission:  06/03/2019     Total days of antibiotics 5         ASSESSMENT/PLAN  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  Cultures positive for MRSA and currently on vancomycin.  Remains on Vemlidy for chronic hepatitis B complicated by cirrhosis.  MRI with a sacral/L5 pedicle osteomyelitis status post decompression - Post op day 5 and doing okay today.  No evidence of bacteremia on blood cultures.  Will require at least 8 weeks of antimicrobial therapy.  Currently has a midline in place and will replace with PICC line due to duration of therapy as well as concern for irritation of the veins with long-term vancomycin.  Continue current dose of vancomycin.  Wound care per neurosurgery as indicated.  Chronic hepatitis B-E antigen positive with remaining blood work pending.  No signs of exacerbation.  Continue current dose of Vemlidy.  Therapeutic drug monitoring - renal function stable with no evidence of nephrotoxicity due to vancomycin.  Continue area under the curve monitoring per pharmacy protocol.   Principal Problem:   Cauda equina compression (HCC) Active Problems:   Chronic hepatitis B (HCC)   Acute urinary retention   Epidural abscess   Septic arthritis (HCC)   Osteomyelitis (HCC)   . feeding supplement (ENSURE ENLIVE)  237 mL Oral TID BM  . methocarbamol  500 mg Oral TID  . multivitamin with minerals  1 tablet Oral Daily  . nicotine  21 mg Transdermal Daily  . oxyCODONE  30 mg Oral Q12H  . pantoprazole  40 mg Oral Daily  . senna-docusate  1 tablet Oral BID  . sodium chloride flush  3 mL Intravenous Q12H  . Tenofovir Alafenamide Fumarate  1 tablet Oral Daily    SUBJECTIVE:  Afebrile overnight with no new concerns.  Continues to have back pain. Feeling a little better today.   No Known Allergies   Review of Systems: Review of Systems  Constitutional: Negative for chills, fever and weight loss.  Respiratory: Negative for cough, shortness of breath and wheezing.   Cardiovascular: Negative for chest pain and leg swelling.  Gastrointestinal: Negative for abdominal pain, constipation, diarrhea, nausea and vomiting.  Skin: Negative for rash.      OBJECTIVE: Vitals:   06/06/19 0551 06/06/19 1741 06/06/19 2152 06/07/19 0538  BP: 135/81 119/63 119/73 103/68  Pulse: 89 (!) 103 94 82  Resp: 18 18    Temp: 98 F (36.7 C) 98.9 F (37.2 C) 98.2 F (36.8 C) 98.2 F (36.8 C)  TempSrc: Oral Oral Oral Oral  SpO2: 99% 100% 99% 98%  Weight:      Height:       Body mass index is 20.92 kg/m.  Physical Exam Constitutional:      General: He is not in acute distress.    Appearance: He is well-developed.     Comments: Lying in bed; pleasant.  Cardiovascular:     Rate and Rhythm: Normal rate and regular rhythm.     Heart sounds: Normal heart sounds.     Comments: Midline in Right upper extremity. Dressing clean and dry. Site without evidence of infection.  Pulmonary:     Effort: Pulmonary effort is normal.     Breath sounds: Normal breath sounds.  Musculoskeletal:     Comments: Surgical  dressing is clean and dry. Wound with good approximation and no evidence of drainage, odor or infection.   Skin:    General: Skin is warm and dry.  Neurological:     Mental Status: He is alert and oriented to person, place, and time.  Psychiatric:        Mood and Affect: Mood normal.     Lab Results Lab Results  Component Value Date   WBC 3.9 (L) 06/07/2019   HGB 10.0 (L) 06/07/2019   HCT 30.9 (L) 06/07/2019   MCV 87.8 06/07/2019   PLT 81 (L) 06/07/2019    Lab Results  Component Value Date   CREATININE 0.74 06/07/2019   BUN 13 06/07/2019   NA 132 (L) 06/07/2019   K 4.1 06/07/2019   CL 97 (L)  06/07/2019   CO2 29 06/07/2019    Lab Results  Component Value Date   ALT 26 06/03/2019   AST 25 06/03/2019   ALKPHOS 207 (H) 06/03/2019   BILITOT 0.7 06/03/2019     Microbiology: Recent Results (from the past 240 hour(s))  SARS Coronavirus 2 (CEPHEID- [facility] Hosp Order     Status: None   Collection Time: 06/03/19  3:56 PM   Specimen: Nasopharyngeal Swab  Result Value Ref Range Status   SARS Coronavirus 2 NEGATIVE NEGATIVE Final    Comment: (NOTE) If result is NEGATIVE SARS-CoV-2 target nucleic acids are NOT DETECTED. The SARS-CoV-2 RNA is generally detectable in upper and lower  respiratory specimens during the acute phase of infection. The lowest  concentration of SARS-CoV-2 viral copies this assay can detect is 250  copies / mL. A negative result does not preclude SARS-CoV-2 infection  and should not be used as the sole basis for treatment or other  patient management decisions.  A negative result may occur with  improper specimen collection / handling, submission of specimen other  than nasopharyngeal swab, presence of viral mutation(s) within the  areas targeted by this assay, and inadequate number of viral copies  (<250 copies / mL). A negative result must be combined with clinical  observations, patient history, and epidemiological information. If result is POSITIVE SARS-CoV-2 target nucleic acids are DETECTED. The SARS-CoV-2 RNA is generally detectable in upper and lower  respiratory specimens dur ing the acute phase of infection.  Positive  results are indicative of active infection with SARS-CoV-2.  Clinical  correlation with patient history and other diagnostic information is  necessary to determine patient infection status.  Positive results do  not rule out bacterial infection or co-infection with other viruses. If result is PRESUMPTIVE POSTIVE SARS-CoV-2 nucleic acids MAY BE PRESENT.   A presumptive positive result was obtained  on the submitted specimen  and confirmed on repeat testing.  While 2019 novel coronavirus  (SARS-CoV-2) nucleic acids may be present in the submitted sample  additional confirmatory testing may be necessary for epidemiological  and / or clinical management purposes  to differentiate between  SARS-CoV-2 and other Sarbecovirus currently known to infect humans.  If clinically indicated additional testing with an alternate test  methodology [phone] is advised. The SARS-CoV-2 RNA is generally  detectable in upper and lower respiratory sp ecimens during the acute  phase of infection. The expected result is Negative. Fact Sheet for Patients:  BoilerBrush.com.cyhttps://www.fda.gov/media/136312/download Fact Sheet for Healthcare Providers: https://pope.com/https://www.fda.gov/media/136313/download This test is not yet approved or cleared by the [place] FDA and has been authorized for detection and/or diagnosis of SARS-CoV-2 by FDA under  an Emergency Use Authorization (EUA).  This EUA will remain in effect (meaning this test can be used) for the duration of the COVID-19 declaration under Section 564(b)(1) of the Act, 21 U.S.C. section 360bbb-3(b)(1), unless the authorization is terminated or revoked sooner. Performed at [facility], [place] [address] [place], [place] [address]   Blood culture (routine x 2)     Status: None (Preliminary result)   Collection Time: 06/03/19  4:00 PM   Specimen: BLOOD RIGHT FOREARM  Result Value Ref Range Status   Specimen Description BLOOD RIGHT FOREARM  Final   Special Requests   Final    BOTTLES DRAWN AEROBIC AND ANAEROBIC Blood Culture adequate volume   Culture   Final    NO GROWTH 3 DAYS Performed at [facility], [place] [address] [place], [place] [address]    Report Status PENDING  Incomplete  Blood culture (routine x 2)     Status: None (Preliminary result)   Collection Time: 06/03/19  4:59 PM   Specimen: BLOOD RIGHT FOREARM  Result Value Ref Range Status   Specimen  Description BLOOD RIGHT FOREARM  Final   Special Requests AEROBIC BOTTLE ONLY Blood Culture adequate volume  Final   Culture   Final    NO GROWTH 3 DAYS Performed at [facility], [place] [address] [place], [place] [address]    Report Status PENDING  Incomplete  Aerobic Culture (superficial specimen)     Status: None   Collection Time: 06/04/19  2:29 AM   Specimen: Abscess  Result Value Ref Range Status   Specimen Description ABSCESS  Final   Special Requests NONE  Final   Gram Stain   Final    RARE WBC PRESENT, PREDOMINANTLY PMN RARE GRAM POSITIVE COCCI Performed at [facility], [place] [address] [place], [place] [address]    Culture   Final    ABUNDANT METHICILLIN RESISTANT STAPHYLOCOCCUS AUREUS   Report Status 06/06/2019 FINAL  Final   Organism ID, Bacteria METHICILLIN RESISTANT STAPHYLOCOCCUS AUREUS  Final      Susceptibility   Methicillin resistant staphylococcus aureus - MIC*    CIPROFLOXACIN >=8 RESISTANT Resistant     ERYTHROMYCIN >=8 RESISTANT Resistant     GENTAMICIN <=0.5 SENSITIVE Sensitive     OXACILLIN >=4 RESISTANT Resistant     TETRACYCLINE <=1 SENSITIVE Sensitive     VANCOMYCIN 1 SENSITIVE Sensitive     TRIMETH/SULFA <=10 SENSITIVE Sensitive     CLINDAMYCIN <=0.25 SENSITIVE Sensitive     RIFAMPIN <=0.5 SENSITIVE Sensitive     Inducible Clindamycin NEGATIVE Sensitive     * ABUNDANT METHICILLIN RESISTANT STAPHYLOCOCCUS AUREUS     Terri Piedra, NP [facility] [phone] Pager  06/07/2019  12:27 PM

## 2019-06-07 NOTE — Plan of Care (Signed)
  Problem: Education: Goal: Knowledge of General Education information will improve Description Including pain rating scale, medication(s)/side effects and non-pharmacologic comfort measures Outcome: Progressing   Problem: Health Behavior/Discharge Planning: Goal: Ability to manage health-related needs will improve Outcome: Progressing   Problem: Clinical Measurements: Goal: Ability to maintain clinical measurements within normal limits will improve Outcome: Progressing Goal: Will remain free from infection Outcome: Progressing   Problem: Activity: Goal: Risk for activity intolerance will decrease Outcome: Progressing   Problem: Nutrition: Goal: Adequate nutrition will be maintained Outcome: Progressing   Problem: Coping: Goal: Level of anxiety will decrease Outcome: Progressing   Problem: Elimination: Goal: Will not experience complications related to urinary retention Outcome: Progressing   Problem: Pain Managment: Goal: General experience of comfort will improve Outcome: Progressing   Problem: Safety: Goal: Ability to remain free from injury will improve Outcome: Progressing   Problem: Skin Integrity: Goal: Risk for impaired skin integrity will decrease Outcome: Progressing   

## 2019-06-08 LAB — HBV REAL-TIME PCR, QUANT
HBV AS IU/ML: 27200000 IU/mL
LOG10 HBV AS IU/ML: 7.435 log10 IU/mL

## 2019-06-08 LAB — CBC
HCT: 32.1 % — ABNORMAL LOW (ref 39.0–52.0)
Hemoglobin: 10.3 g/dL — ABNORMAL LOW (ref 13.0–17.0)
MCH: 28.4 pg (ref 26.0–34.0)
MCHC: 32.1 g/dL (ref 30.0–36.0)
MCV: 88.4 fL (ref 80.0–100.0)
Platelets: 82 10*3/uL — ABNORMAL LOW (ref 150–400)
RBC: 3.63 MIL/uL — ABNORMAL LOW (ref 4.22–5.81)
RDW: 13.9 % (ref 11.5–15.5)
WBC: 4.8 10*3/uL (ref 4.0–10.5)
nRBC: 0 % (ref 0.0–0.2)

## 2019-06-08 LAB — BASIC METABOLIC PANEL
Anion gap: 5 (ref 5–15)
BUN: 13 mg/dL (ref 6–20)
CO2: 27 mmol/L (ref 22–32)
Calcium: 8 mg/dL — ABNORMAL LOW (ref 8.9–10.3)
Chloride: 100 mmol/L (ref 98–111)
Creatinine, Ser: 0.81 mg/dL (ref 0.61–1.24)
GFR calc Af Amer: >60 mL/min (ref >60)
GFR calc non Af Amer: >60 mL/min (ref >60)
Glucose, Bld: 138 mg/dL — ABNORMAL HIGH (ref 70–99)
Potassium: 4.3 mmol/L (ref 3.5–5.1)
Sodium: 132 mmol/L — ABNORMAL LOW (ref 135–145)

## 2019-06-08 LAB — CULTURE, BLOOD (ROUTINE X 2)
Culture: NO GROWTH
Culture: NO GROWTH
Special Requests: ADEQUATE
Special Requests: ADEQUATE

## 2019-06-08 LAB — VANCOMYCIN, TROUGH: Vancomycin Tr: 10 ug/mL — ABNORMAL LOW (ref 15–20)

## 2019-06-08 LAB — HEPATITIS B DNA, ULTRAQUANTITATIVE, PCR

## 2019-06-08 LAB — VANCOMYCIN, PEAK: Vancomycin Pk: 35 ug/mL (ref 30–40)

## 2019-06-08 MED ORDER — HYDROXYZINE HCL 25 MG PO TABS
25.0000 mg | ORAL_TABLET | Freq: Three times a day (TID) | ORAL | Status: DC | PRN
  Administered 2019-06-08 – 2019-06-15 (×5): 25 mg via ORAL
  Filled 2019-06-08 (×5): qty 1

## 2019-06-08 NOTE — Progress Notes (Signed)
Chart reviewed for LOS; B Annarae Macnair RN,MHA,BSN Advanced Care Supervisor [phone] 

## 2019-06-08 NOTE — Plan of Care (Signed)
  Problem: Education: Goal: Knowledge of General Education information will improve Description Including pain rating scale, medication(s)/side effects and non-pharmacologic comfort measures Outcome: Progressing   Problem: Health Behavior/Discharge Planning: Goal: Ability to manage health-related needs will improve Outcome: Progressing   Problem: Clinical Measurements: Goal: Ability to maintain clinical measurements within normal limits will improve Outcome: Progressing Goal: Will remain free from infection Outcome: Progressing   Problem: Activity: Goal: Risk for activity intolerance will decrease Outcome: Progressing   Problem: Nutrition: Goal: Adequate nutrition will be maintained Outcome: Progressing   Problem: Coping: Goal: Level of anxiety will decrease Outcome: Progressing   Problem: Elimination: Goal: Will not experience complications related to urinary retention Outcome: Progressing   Problem: Pain Managment: Goal: General experience of comfort will improve Outcome: Progressing   Problem: Safety: Goal: Ability to remain free from injury will improve Outcome: Progressing   Problem: Skin Integrity: Goal: Risk for impaired skin integrity will decrease Outcome: Progressing   

## 2019-06-08 NOTE — Progress Notes (Signed)
   Providing Compassionate, Quality Care - Together   Subjective: Patient reports no issues overnight. Continued incisional pain. Still voiding without issue.  Objective: Vital signs in last 24 hours: Temp:  [98.1 F (36.7 C)-98.9 F (37.2 C)] 98.1 F (36.7 C) (06/25 0542) Pulse Rate:  [83-90] 83 (06/25 0542) Resp:  [17-18] 18 (06/25 0542) BP: (99-114)/(65-78) 114/78 (06/25 0542) SpO2:  [96 %-97 %] 97 % (06/25 0542)  Intake/Output from previous day: 06/24 0701 - 06/25 0700 In: 53 [P.O.:960] Out: 2780 [Urine:2780] Intake/Output this shift: Total I/O In: 250 [P.O.:250] Out: 900 [Urine:900]  Alert and oriented x 4 PERRLA MAE, Strength and sensation intact Incision clean, dry, and intact; Steri strips in place Site is without drainage  Lab Results: Recent Labs    06/07/19 0400 06/08/19 0413  WBC 3.9* 4.8  HGB 10.0* 10.3*  HCT 30.9* 32.1*  PLT 81* 82*   BMET Recent Labs    06/07/19 0400 06/08/19 0413  NA 132* 132*  K 4.1 4.3  CL 97* 100  CO2 29 27  GLUCOSE 121* 138*  BUN 13 13  CREATININE 0.74 0.81  CALCIUM 8.0* 8.0*    Studies/Results: [place] Ekg Site Rite  Result Date: 06/07/2019 If Site Rite image not attached, placement could not be confirmed due to current cardiac rhythm.   Assessment/Plan: Patient is 4 days s/p L5-S1 decompressive laminectomy with evacuation of epidural abscess by Dr. Annette Stable. Abscess culture has grown MRSA.   LOS: 4 days    -Continue to mobilize -Abx therapy per ID  Viona Gilmore, DNP, AGNP-C Nurse Practitioner  [facility] [place]. [address], [address], [place], [place] [address] P: [phone]    F: [phone]  06/08/2019, 10:03 AM

## 2019-06-08 NOTE — Progress Notes (Signed)
Pharmacy Antibiotic Note  Dan [name] is a 48 y.o. male admitted on 06/03/2019 with epidural abscess.  Pharmacy has been consulted for Vancomycin dosing. Pt pos-op laminectomy with evacuation of abscess 6/21.   WBC WNL, Scr stable at 0.81. Currently therapeutic with calculated AUC 529.   Plan: Continue vancomycin to 1000 mg IV q12h >>Estimated AUC: 528 Vancomycin levels as indicated Current plan for 8 weeks of IV Vancomycin therapy   Height: 5\' 11"  (180.3 cm) Weight: 150 lb (68 kg) IBW/kg (Calculated) : 75.3  Temp (24hrs), Avg:98.5 F (36.9 C), Min:98.1 F (36.7 C), Max:98.9 F (37.2 C)  Recent Labs  Lab 06/03/19 1600 06/04/19 0748 06/05/19 0608 06/06/19 0416 06/06/19 1038 06/07/19 0400 06/08/19 0413 06/08/19 1040 06/08/19 1257  WBC 6.4 4.4 3.9* 4.0  --  3.9* 4.8  --   --   CREATININE 0.70  --  0.83 0.71  --  0.74 0.81  --   --   VANCOTROUGH  --   --   --   --   --   --   --  10*  --   VANCOPEAK  --   --   --   --   --   --   --   --  35  VANCORANDOM  --   --   --  40 22  --   --   --   --     Estimated Creatinine Clearance: 104.9 mL/min (by C-G formula based on SCr of 0.81 mg/dL).    No Known Allergies  Antibiotics this admission: 6/21 CTX >>6/22 6/21 Vanc >> 6/21 Tenofovir>>  Dose adjustments this admission: Vancomycin 1000mg  Q 8hr >> AUC 777, reduced to Vancomycin 1000mg  Q 12hr   Micro results: 6/21 AbscessCx: abundant MRSA 6/20 BCx: ngtd 6/20 Covid: neg  Azzie Roup D PGY1 Pharmacy Resident  Phone [phone] Please use AMION for clinical pharmacists numbers  06/08/2019      2:28 PM

## 2019-06-08 NOTE — Progress Notes (Signed)
PT Cancellation Note  Patient Details Name: Dan [name] MRN: [id number] DOB: 04/22/1971   Cancelled Treatment:    Reason Eval/Treat Not Completed: Pain limiting ability to participate   Patient reporting pain 8 out of 10 despite pain medication ~1 hr ago. Patient requesting additional pain medication prior to PT/activity. RN made aware. Will return    [employer], PT 06/08/2019, 10:57 AM

## 2019-06-08 NOTE — Progress Notes (Signed)
PROGRESS NOTE    [name]  WFU:[id number] DOB: 1971-10-10 DOA: 06/03/2019 PCP: Imagene Riches, NP   Brief Narrative: Dan [name] is a 48 y.o. male with a history of anxiety, chronic low back pain, liver cirrhosis, depression, hepatitis B/C, polysubstance abuse. He presented secondary to generalized body aches and found to have an epidural abscess with associated septic arthritis leading to severe cauda equina compression. He underwent disc laminectomy by neurosurgery and abscess is growing MRS. Started on Vancomycin IV for planned course of 8 weeks inpatient.   Assessment & Plan:   Principal Problem:   Cauda equina compression (HCC) Active Problems:   Chronic hepatitis B (HCC)   Acute urinary retention   Epidural abscess   Septic arthritis (HCC)   Osteomyelitis (HCC)   Lumbar epidural abscess Severe cauda equina compression Septic arthritis of right L5-S1 facet joint, right SI joint/sacrum Patient underwent urgent lumbar laminectomy on 6/21 per neurosurgery for evacuation of epidural abscess. Cultures from abscess obtained and are significant for MRSA.  Infectious disease consulted and recommends 8 weeks of IV vancomycin. Blood cultures from 6/20 have been no growth to date.  Likely related to patient's IV drug use. Neurologically stable. -Continue vancomycin IV -Continue OxyContin, oxycodone IR as needed, Dilaudid as needed.  Will need to de-escalate narcotic therapy for discharge when ready  Hyponatremia Mild. Stable.  Elevated alkaline phosphatase Thought secondary to bone pathology. No clinical symptoms of cholecystitis. Has a history of distended gallbladder from CT abdomen last year.  History of polysubstance abuse Heroin use as an outpatient  Tobacco abuse -Continue nicotine patch  Anxiety Patient states he is prescribed Xanax 1 mg for this. Checked database and no prescription seen for Xanax -Atarax 25 mg TID prn  Cirrhosis Hepatitis B/C Appears stable  clinically. E antigen is positive. Outpatient follow-up with GI and ID -Continue Tonofovir -ID recommendations   DVT prophylaxis: Heparin subq Code Status:   Code Status: Full Code Family Communication: None Disposition Plan: Discharge after 8 weeks of IV Vancomycin   Consultants:   Neurosurgery  Infectious disease  Procedures:   6/21: L5-S1 decompressive laminectomy with evacuation of epidural abscess  Antimicrobials:  Ceftriaxone (6/20>>6/21)  Vancomycin (6/20>>  Tenofovir (6/21>>   Subjective: Back pain.  Objective: Vitals:   06/06/19 2152 06/07/19 0538 06/07/19 2042 06/08/19 0542  BP: 119/73 103/68 99/65 114/78  Pulse: 94 82 90 83  Resp:   17 18  Temp: 98.2 F (36.8 C) 98.2 F (36.8 C) 98.9 F (37.2 C) 98.1 F (36.7 C)  TempSrc: Oral Oral  Oral  SpO2: 99% 98% 96% 97%  Weight:      Height:        Intake/Output Summary (Last 24 hours) at 06/08/2019 1126 Last data filed at 06/08/2019 0925 Gross per 24 hour  Intake 970 ml  Output 3200 ml  Net -2230 ml   Filed Weights   06/03/19 1452  Weight: 68 kg    Examination:  General exam: Appears calm and comfortable Respiratory system: Clear to auscultation. Respiratory effort normal. Cardiovascular system: S1 & S2 heard, RRR. No murmurs, rubs, gallops or clicks. Gastrointestinal system: Abdomen is nondistended, soft and nontender. No organomegaly or masses felt. Normal bowel sounds heard. Central nervous system: Alert and oriented. No focal neurological deficits. Extremities: No edema. No calf tenderness Skin: No cyanosis. No rashes Psychiatry: Judgement and insight appear normal. Mood & affect appropriate.    Data Reviewed: I have personally reviewed following labs and imaging studies  CBC:  Recent Labs  Lab 06/03/19 1600 06/04/19 0748 06/05/19 0608 06/06/19 0416 06/07/19 0400 06/08/19 0413  WBC 6.4 4.4 3.9* 4.0 3.9* 4.8  NEUTROABS 4.7  --   --   --   --   --   HGB 11.4* 11.9* 10.0* 10.6*  10.0* 10.3*  HCT 35.1* 36.2* 31.4* 33.2* 30.9* 32.1*  MCV 87.3 85.6 88.2 87.6 87.8 88.4  PLT 81* 71* 74* 78* 81* 82*   Basic Metabolic Panel: Recent Labs  Lab 06/03/19 1600 06/05/19 0608 06/06/19 0416 06/07/19 0400 06/08/19 0413  NA 130* 132* 133* 132* 132*  K 3.9 3.7 4.3 4.1 4.3  CL 95* 102 100 97* 100  CO2 24 25 26 29 27   GLUCOSE 86 173* 138* 121* 138*  BUN 18 21* 16 13 13   CREATININE 0.70 0.83 0.71 0.74 0.81  CALCIUM 8.1* 7.5* 8.1* 8.0* 8.0*   GFR: Estimated Creatinine Clearance: 104.9 mL/min (by C-G formula based on SCr of 0.81 mg/dL). Liver Function Tests: Recent Labs  Lab 06/03/19 1600  AST 25  ALT 26  ALKPHOS 207*  BILITOT 0.7  PROT 7.4  ALBUMIN 2.3*   No results for input(s): LIPASE, AMYLASE in the last 168 hours. No results for input(s): AMMONIA in the last 168 hours. Coagulation Profile: No results for input(s): INR, PROTIME in the last 168 hours. Cardiac Enzymes: Recent Labs  Lab 06/03/19 1600  TROPONINI <0.03   BNP (last 3 results) No results for input(s): PROBNP in the last 8760 hours. HbA1C: No results for input(s): HGBA1C in the last 72 hours. CBG: No results for input(s): GLUCAP in the last 168 hours. Lipid Profile: No results for input(s): CHOL, HDL, LDLCALC, TRIG, CHOLHDL, LDLDIRECT in the last 72 hours. Thyroid Function Tests: No results for input(s): TSH, T4TOTAL, FREET4, T3FREE, THYROIDAB in the last 72 hours. Anemia Panel: No results for input(s): VITAMINB12, FOLATE, FERRITIN, TIBC, IRON, RETICCTPCT in the last 72 hours. Sepsis Labs: No results for input(s): PROCALCITON, LATICACIDVEN in the last 168 hours.  Recent Results (from the past 240 hour(s))  SARS Coronavirus 2 (CEPHEID- Performed in [facility] hospital lab), Hosp Order     Status: None   Collection Time: 06/03/19  3:56 PM   Specimen: Nasopharyngeal Swab  Result Value Ref Range Status   SARS Coronavirus 2 NEGATIVE NEGATIVE Final    Comment: (NOTE) If result is NEGATIVE  SARS-CoV-2 target nucleic acids are NOT DETECTED. The SARS-CoV-2 RNA is generally detectable in upper and lower  respiratory specimens during the acute phase of infection. The lowest  concentration of SARS-CoV-2 viral copies this assay can detect is 250  copies / mL. A negative result does not preclude SARS-CoV-2 infection  and should not be used as the sole basis for treatment or other  patient management decisions.  A negative result may occur with  improper specimen collection / handling, submission of specimen other  than nasopharyngeal swab, presence of viral mutation(s) within the  areas targeted by this assay, and inadequate number of viral copies  (<250 copies / mL). A negative result must be combined with clinical  observations, patient history, and epidemiological information. If result is POSITIVE SARS-CoV-2 target nucleic acids are DETECTED. The SARS-CoV-2 RNA is generally detectable in upper and lower  respiratory specimens dur ing the acute phase of infection.  Positive  results are indicative of active infection with SARS-CoV-2.  Clinical  correlation with patient history and other diagnostic information is  necessary to determine patient infection status.  Positive results do  not rule out bacterial infection or co-infection with other viruses. If result is PRESUMPTIVE POSTIVE SARS-CoV-2 nucleic acids MAY BE PRESENT.   A presumptive positive result was obtained on the submitted specimen  and confirmed on repeat testing.  While 2019 novel coronavirus  (SARS-CoV-2) nucleic acids may be present in the submitted sample  additional confirmatory testing may be necessary for epidemiological  and / or clinical management purposes  to differentiate between  SARS-CoV-2 and other Sarbecovirus currently known to infect humans.  If clinically indicated additional testing with an alternate test  methodology [phone] is advised. The SARS-CoV-2 RNA is generally  detectable in upper  and lower respiratory sp ecimens during the acute  phase of infection. The expected result is Negative. Fact Sheet for Patients:  BoilerBrush.com.cyhttps://www.fda.gov/media/136312/download Fact Sheet for Healthcare Providers: https://pope.com/https://www.fda.gov/media/136313/download This test is not yet approved or cleared by the [place] FDA and has been authorized for detection and/or diagnosis of SARS-CoV-2 by FDA under an Emergency Use Authorization (EUA).  This EUA will remain in effect (meaning this test can be used) for the duration of the COVID-19 declaration under Section 564(b)(1) of the Act, 21 U.S.C. section 360bbb-3(b)(1), unless the authorization is terminated or revoked sooner. Performed at [facility], [address] [place], [place] [address]   Blood culture (routine x 2)     Status: None   Collection Time: 06/03/19  4:00 PM   Specimen: BLOOD RIGHT FOREARM  Result Value Ref Range Status   Specimen Description BLOOD RIGHT FOREARM  Final   Special Requests   Final    BOTTLES DRAWN AEROBIC AND ANAEROBIC Blood Culture adequate volume   Culture   Final    NO GROWTH 5 DAYS Performed at [facility], [address] [place], [place] [address]    Report Status 06/08/2019 FINAL  Final  Blood culture (routine x 2)     Status: None   Collection Time: 06/03/19  4:59 PM   Specimen: BLOOD RIGHT FOREARM  Result Value Ref Range Status   Specimen Description BLOOD RIGHT FOREARM  Final   Special Requests AEROBIC BOTTLE ONLY Blood Culture adequate volume  Final   Culture   Final    NO GROWTH 5 DAYS Performed at [facility], [address] [place], [place] [address]    Report Status 06/08/2019 FINAL  Final  Aerobic Culture (superficial specimen)     Status: None   Collection Time: 06/04/19  2:29 AM   Specimen: Abscess  Result Value Ref Range Status   Specimen Description ABSCESS  Final   Special Requests NONE  Final   Gram Stain   Final    RARE WBC PRESENT, PREDOMINANTLY PMN  RARE GRAM POSITIVE COCCI Performed at [facility], [address] [place], [place] [address]    Culture   Final    ABUNDANT METHICILLIN RESISTANT STAPHYLOCOCCUS AUREUS   Report Status 06/06/2019 FINAL  Final   Organism ID, Bacteria METHICILLIN RESISTANT STAPHYLOCOCCUS AUREUS  Final      Susceptibility   Methicillin resistant staphylococcus aureus - MIC*    CIPROFLOXACIN >=8 RESISTANT Resistant     ERYTHROMYCIN >=8 RESISTANT Resistant     GENTAMICIN <=0.5 SENSITIVE Sensitive     OXACILLIN >=4 RESISTANT Resistant     TETRACYCLINE <=1 SENSITIVE Sensitive     VANCOMYCIN 1 SENSITIVE Sensitive     TRIMETH/SULFA <=10 SENSITIVE Sensitive     CLINDAMYCIN <=0.25 SENSITIVE Sensitive     RIFAMPIN <=0.5 SENSITIVE  Sensitive     Inducible Clindamycin NEGATIVE Sensitive     * ABUNDANT METHICILLIN RESISTANT STAPHYLOCOCCUS AUREUS         Radiology Studies: [place] Ekg Site Rite  Result Date: 06/07/2019 If Site Rite image not attached, placement could not be confirmed due to current cardiac rhythm.       Scheduled Meds: . feeding supplement (ENSURE ENLIVE)  237 mL Oral TID BM  . methocarbamol  500 mg Oral TID  . multivitamin with minerals  1 tablet Oral Daily  . nicotine  21 mg Transdermal Daily  . oxyCODONE  30 mg Oral Q12H  . pantoprazole  40 mg Oral Daily  . senna-docusate  1 tablet Oral BID  . sodium chloride flush  3 mL Intravenous Q12H  . Tenofovir Alafenamide Fumarate  1 tablet Oral Daily   Continuous Infusions: . vancomycin 1,000 mg (06/08/19 1103)     LOS: 4 days     Jacquelin Hawkingalph Sherece Gambrill, MD [facility] 06/08/2019, 11:26 AM  If 7PM-7AM, please contact night-coverage [email or web address]

## 2019-06-08 NOTE — Progress Notes (Addendum)
Physical Therapy Treatment Patient Details Name: Dan [name] MRN: [id number] DOB: 1971/08/18 Today's Date: 06/08/2019    History of Present Illness 48 y.o. male with medical history significant of anxiety, chronic low back pain, liver cirrhosis, depression, hep B, hep C, polysubstance abuse presenting with a chief complaint of generalized body aches. MRI findings included EPIDURAL ABSCESS, LUMBAR. Pt is now s/p LUMBAR LAMINECTOMY FOR EPIDURAL ABSCESS, L5-S1.    PT Comments    Patient agreed to PT session after receiving additional pain medication. Educated on role of activity in managing his pain and he verbalized understanding. He reported his pain 8/10 prior to session and 7/10 afterwards. He is walking well without imbalance, although reports considerable bil knee pain (worse than his back pain). Based on his ability to get EOB independently, sit to stand independently, and walk with supervision x 120 ft updated his discharge plan for his therapy needs. As per discussion with Octavio GravesIsabel Chasse, LCSWA , he may need inpatient setting for his IV antibiotics due to h/o substance abuse.     Follow Up Recommendations  No PT follow up     Equipment Recommendations  None recommended by PT    Recommendations for Other Services       Precautions / Restrictions Precautions Precautions: Fall Required Braces or Orthoses: (no brace needed) Restrictions Weight Bearing Restrictions: No    Mobility  Bed Mobility               General bed mobility comments: pt sitting EOB on arrival  Transfers Overall transfer level: Independent   Transfers: Sit to/from Stand Sit to Stand: Independent            Ambulation/Gait Ambulation/Gait assistance: Supervision Gait Distance (Feet): 120 Feet Assistive device: None Gait Pattern/deviations: Step-through pattern;Decreased stride length;Narrow base of support     General Gait Details: Patient walks quickly; reported pain decr 8/10 to 7/10.     Stairs         General stair comments: Declined stair training when offered, "it won't be a problem"   Wheelchair Mobility    Modified Rankin (Stroke Patients Only)       Balance   Sitting-balance support: Feet supported;No upper extremity supported Sitting balance-Leahy Scale: Good     Standing balance support: No upper extremity supported Standing balance-Leahy Scale: Good                              Cognition Arousal/Alertness: Awake/alert Behavior During Therapy: WFL for tasks assessed/performed;Impulsive Overall Cognitive Status: Within Functional Limits for tasks assessed(for simple mobility)                                        Exercises      General Comments        Pertinent Vitals/Pain Pain Assessment: 0-10 Pain Score: 7  Pain Location: bil knees worse than back Pain Descriptors / Indicators: Discomfort Pain Intervention(s): Limited activity within patient's tolerance;Premedicated before session;Monitored during session;Repositioned    Home Living Family/patient expects to be discharged to:: Private residence(or SNF) Living Arrangements: Parent Available Help at Discharge: Available PRN/intermittently Type of Home: Mobile home Home Access: Stairs to enter Entrance Stairs-Rails: Right Home Layout: One level Home Equipment: Environmental consultantWalker - 2 wheels;Cane - single point;Wheelchair - manual Additional Comments: Lives with mother, and states she is unable to physically assist.  Prior Function Level of Independence: Independent      Comments: Reports he has been using RW, however, did require use of WC when he felt more debilitated. Reports he has had increased difficulty with mobility and has had falls at home.    PT Goals (current goals can now be found in the care plan section) Acute Rehab PT Goals Patient Stated Goal: did not state Time For Goal Achievement: 06/18/19 Progress towards PT goals: Progressing toward  goals    Frequency    Min 3X/week      PT Plan Discharge plan needs to be updated    Co-evaluation              AM-PAC PT "6 Clicks" Mobility   Outcome Measure  Help needed turning from your back to your side while in a flat bed without using bedrails?: None Help needed moving from lying on your back to sitting on the side of a flat bed without using bedrails?: None Help needed moving to and from a bed to a chair (including a wheelchair)?: None Help needed standing up from a chair using your arms (e.g., wheelchair or bedside chair)?: None Help needed to walk in hospital room?: A Little Help needed climbing 3-5 steps with a railing? : A Little 6 Click Score: 22    End of Session   Activity Tolerance: Patient tolerated treatment well;No increased pain Patient left: with call bell/phone within reach;in chair   PT Visit Diagnosis: Unsteadiness on feet (R26.81);Other abnormalities of gait and mobility (R26.89);Muscle weakness (generalized) (M62.81);Difficulty in walking, not elsewhere classified (R26.2);Other symptoms and signs involving the nervous system (R29.898)     Time: 6578-4696 PT Time Calculation (min) (ACUTE ONLY): 10 min  Charges:  $Gait Training: 8-22 mins                        [employer], PT 06/08/2019, 12:04 PM

## 2019-06-09 DIAGNOSIS — A4902 Methicillin resistant Staphylococcus aureus infection, unspecified site: Secondary | ICD-10-CM

## 2019-06-09 LAB — BASIC METABOLIC PANEL
Anion gap: 10 (ref 5–15)
BUN: 16 mg/dL (ref 6–20)
CO2: 24 mmol/L (ref 22–32)
Calcium: 8.2 mg/dL — ABNORMAL LOW (ref 8.9–10.3)
Chloride: 98 mmol/L (ref 98–111)
Creatinine, Ser: 0.68 mg/dL (ref 0.61–1.24)
GFR calc Af Amer: >60 mL/min (ref >60)
GFR calc non Af Amer: >60 mL/min (ref >60)
Glucose, Bld: 102 mg/dL — ABNORMAL HIGH (ref 70–99)
Potassium: 5 mmol/L (ref 3.5–5.1)
Sodium: 132 mmol/L — ABNORMAL LOW (ref 135–145)

## 2019-06-09 LAB — CBC
HCT: 32.8 % — ABNORMAL LOW (ref 39.0–52.0)
Hemoglobin: 11.1 g/dL — ABNORMAL LOW (ref 13.0–17.0)
MCH: 28.7 pg (ref 26.0–34.0)
MCHC: 33.8 g/dL (ref 30.0–36.0)
MCV: 84.8 fL (ref 80.0–100.0)
Platelets: 92 10*3/uL — ABNORMAL LOW (ref 150–400)
RBC: 3.87 MIL/uL — ABNORMAL LOW (ref 4.22–5.81)
RDW: 13.8 % (ref 11.5–15.5)
WBC: 5.8 10*3/uL (ref 4.0–10.5)
nRBC: 0 % (ref 0.0–0.2)

## 2019-06-09 MED ORDER — SODIUM CHLORIDE 0.9% FLUSH: 10.0000 mL | INTRAVENOUS | Status: DC | PRN

## 2019-06-09 NOTE — Progress Notes (Signed)
Peripherally Inserted Central Catheter/Midline Placement  The IV Nurse has discussed with the patient and/or persons authorized to consent for the patient, the purpose of this procedure and the potential benefits and risks involved with this procedure.  The benefits include less needle sticks, lab draws from the catheter, and the patient may be discharged home with the catheter. Risks include, but not limited to, infection, bleeding, blood clot (thrombus formation), and puncture of an artery; nerve damage and irregular heartbeat and possibility to perform a PICC exchange if needed/ordered by physician.  Alternatives to this procedure were also discussed.  Bard Power PICC patient education guide, fact sheet on infection prevention and patient information card has been provided to patient /or left at bedside.    PICC/Midline Placement Documentation        Dan [name] 06/09/2019, 10:01 AM

## 2019-06-09 NOTE — Progress Notes (Signed)
[facility] for Infectious Disease    Date of Admission:  06/03/2019   Total days of antibiotics 7           ID: Dan [name] is a 48 y.o. male with mrsa epidural abscess causing cauda equina s/p decompression Principal Problem:   Cauda equina compression (HCC) Active Problems:   Chronic hepatitis B (HCC)   Acute urinary retention   Epidural abscess   Septic arthritis (HCC)   Osteomyelitis (HCC)    Subjective: Afebrile, continues to urinate without difficulty.  Medications:  . feeding supplement (ENSURE ENLIVE)  237 mL Oral TID BM  . methocarbamol  500 mg Oral TID  . multivitamin with minerals  1 tablet Oral Daily  . nicotine  21 mg Transdermal Daily  . oxyCODONE  30 mg Oral Q12H  . pantoprazole  40 mg Oral Daily  . senna-docusate  1 tablet Oral BID  . sodium chloride flush  3 mL Intravenous Q12H  . Tenofovir Alafenamide Fumarate  1 tablet Oral Daily    Objective: Vital signs in last 24 hours: Temp:  [98 F (36.7 C)-98.3 F (36.8 C)] 98.3 F (36.8 C) (06/26 0416) Pulse Rate:  [85-98] 89 (06/26 0416) Resp:  [16-18] 16 (06/26 0416) BP: (95-107)/(53-82) 101/64 (06/26 0416) SpO2:  [97 %-100 %] 100 % (06/26 0416) Physical Exam  Constitutional: He is oriented to person, place, and time. He appears well-developed and well-nourished. No distress.  HENT:  Mouth/Throat: Oropharynx is clear and moist. No oropharyngeal exudate.  Cardiovascular: Normal rate, regular rhythm and normal heart sounds. Exam reveals no gallop and no friction rub.  No murmur heard.  Pulmonary/Chest: Effort normal and breath sounds normal. No respiratory distress. He has no wheezes.  Abdominal: Soft. Bowel sounds are normal. He exhibits no distension. There is no tenderness.  Back: back incision is c/d/i Neurological: He is alert and oriented to person, place, and time.  Skin: Skin is warm and dry. No rash noted. No erythema.  Psychiatric: He has a normal mood and affect. His behavior is  normal.     Lab Results Recent Labs    06/08/19 0413 06/09/19 0656  WBC 4.8 5.8  HGB 10.3* 11.1*  HCT 32.1* 32.8*  NA 132* 132*  K 4.3 5.0  CL 100 98  CO2 27 24  BUN 13 16  CREATININE 0.81 0.68    Microbiology: 6/21 epidural abscess / 6/20 blood cx ngtd Organism ID, Bacteria METHICILLIN RESISTANT STAPHYLOCOCCUS AUREUS   Resulting Agency [facility]  Susceptibility   Methicillin resistant staphylococcus aureus    MIC    CIPROFLOXACIN >=8 RESISTANT  Resistant    CLINDAMYCIN <=0.25 SENS... Sensitive    ERYTHROMYCIN >=8 RESISTANT  Resistant    GENTAMICIN <=0.5 SENSI... Sensitive    Inducible Clindamycin NEGATIVE  Sensitive    OXACILLIN >=4 RESISTANT  Resistant    RIFAMPIN <=0.5 SENSI... Sensitive    TETRACYCLINE <=1 SENSITIVE  Sensitive    TRIMETH/SULFA <=10 SENSIT... Sensitive    VANCOMYCIN 1 SENSITIVE  Sensitive         Susceptibility Comments     Studies/Results: No results found.   Assessment/Plan: MRSA epidural abscess = continue on iv vancomycin - plan to treat for 8 wk using 6/22 as day 1. Will have opat orders in depending on his dispo. Will arrange for follow up appt in 6-8 wk.   Will check in on periodically while hospitalized. If questions, call id on-call  [facility] for  Infectious Diseases Cell: [phone] Pager: [phone]  06/09/2019, 1:04 PM

## 2019-06-09 NOTE — Progress Notes (Signed)
   Providing Compassionate, Quality Care - Together   Subjective: Patient reports back is sore. No issues overnight.  Objective: Vital signs in last 24 hours: Temp:  [98 F (36.7 C)-98.3 F (36.8 C)] 98.3 F (36.8 C) (06/26 0416) Pulse Rate:  [85-98] 89 (06/26 0416) Resp:  [16-18] 16 (06/26 0416) BP: (95-107)/(53-82) 101/64 (06/26 0416) SpO2:  [97 %-100 %] 100 % (06/26 0416)  Intake/Output from previous day: 06/25 0701 - 06/26 0700 In: 250 [P.O.:250] Out: 1625 [Urine:1625] Intake/Output this shift: Total I/O In: 480 [P.O.:480] Out: 800 [Urine:800]  Alert and oriented x 4 PERRLA MAE, Strength and sensation intact Incision clean, dry, and intact; Steri strips in place Site is without drainage  Lab Results: Recent Labs    06/08/19 0413 06/09/19 0656  WBC 4.8 5.8  HGB 10.3* 11.1*  HCT 32.1* 32.8*  PLT 82* 92*   BMET Recent Labs    06/08/19 0413 06/09/19 0656  NA 132* 132*  K 4.3 5.0  CL 100 98  CO2 27 24  GLUCOSE 138* 102*  BUN 13 16  CREATININE 0.81 0.68  CALCIUM 8.0* 8.2*    Studies/Results: [place] Ekg Site Rite  Result Date: 06/07/2019 If Site Rite image not attached, placement could not be confirmed due to current cardiac rhythm.   Assessment/Plan: Patient is 5 days s/p L5-S1 decompressive laminectomy with evacuation of epidural abscess by Dr. Annette Stable.Patient is voiding without issue. Abscess culture has grown MRSA.   LOS: 5 days    -Continue to mobilize -Abx therapy per ID -Neurosurgery will sign off. Patient can follow up with Dr. Annette Stable as an outpatient.  Viona Gilmore, DNP, AGNP-C Nurse Practitioner  [facility] [place] [address] [place], [place], [place] [address] P: [phone]    F: [phone]  06/09/2019, 11:44 AM

## 2019-06-09 NOTE — Progress Notes (Signed)
PROGRESS NOTE    Dan Alphaony W Luhmann  WUJ:[id number] DOB: 20-Aug-1971 DOA: 06/03/2019 PCP: Dema SeverinYork, [name], NP   Brief Narrative: Dan [name] is a 48 y.o. male with a history of anxiety, chronic low back pain, liver cirrhosis, depression, hepatitis B/C, polysubstance abuse. He presented secondary to generalized body aches and found to have an epidural abscess with associated septic arthritis leading to severe cauda equina compression. He underwent disc laminectomy by neurosurgery and abscess is growing MRS. Started on Vancomycin IV for planned course of 8 weeks inpatient.   Assessment & Plan:   Principal Problem:   Cauda equina compression (HCC) Active Problems:   Chronic hepatitis B (HCC)   Acute urinary retention   Epidural abscess   Septic arthritis (HCC)   Osteomyelitis (HCC)   Lumbar epidural abscess Severe cauda equina compression Septic arthritis of right L5-S1 facet joint, right SI joint/sacrum Patient underwent urgent lumbar laminectomy on 6/21 per neurosurgery for evacuation of epidural abscess. Cultures from abscess obtained and are significant for MRSA.  Infectious disease consulted and recommends 8 weeks of IV vancomycin. Blood cultures from 6/20 have been no growth to date.  Likely related to patient's IV drug use. Neurologically stable. -Continue vancomycin IV -Continue OxyContin, oxycodone IR as needed, Dilaudid as needed.  Will need to de-escalate narcotic therapy for discharge when ready. This was discussed with the patient.  Hyponatremia Mild. Stable.  Elevated alkaline phosphatase Thought secondary to bone pathology. No clinical symptoms of cholecystitis. Has a history of distended gallbladder from CT abdomen last year.  History of polysubstance abuse Heroin use as an outpatient  Tobacco abuse -Continue nicotine patch  Anxiety Patient states he is prescribed Xanax 1 mg for this. Checked database and no prescription seen for Xanax -Atarax 25 mg TID prn   Cirrhosis Hepatitis B/C Appears stable clinically. E antigen is positive. Outpatient follow-up with GI and ID -Continue Tonofovir   DVT prophylaxis: Heparin subq Code Status:   Code Status: Full Code Family Communication: None Disposition Plan: Discharge after 8 weeks of IV Vancomycin   Consultants:   Neurosurgery  Infectious disease  Procedures:   6/21: L5-S1 decompressive laminectomy with evacuation of epidural abscess  Antimicrobials:  Ceftriaxone (6/20>>6/21)  Vancomycin (6/20>>  Tenofovir (6/21>>   Subjective: Still with some back pain.  Objective: Vitals:   06/08/19 0542 06/08/19 1431 06/08/19 2054 06/09/19 0416  BP: 114/78 107/82 (!) 95/53 101/64  Pulse: 83 85 98 89  Resp: 18 18 16 16   Temp: 98.1 F (36.7 C) 98 F (36.7 C) 98.2 F (36.8 C) 98.3 F (36.8 C)  TempSrc: Oral Oral Oral Oral  SpO2: 97% 97% 99% 100%  Weight:      Height:        Intake/Output Summary (Last 24 hours) at 06/09/2019 1122 Last data filed at 06/09/2019 1045 Gross per 24 hour  Intake 480 ml  Output 1525 ml  Net -1045 ml   Filed Weights   06/03/19 1452  Weight: 68 kg    Examination:  General exam: Appears calm and comfortable Respiratory system: Clear to auscultation. Respiratory effort normal. Cardiovascular system: S1 & S2 heard, RRR. No murmurs, rubs, gallops or clicks. Gastrointestinal system: Abdomen is nondistended, soft and nontender. No organomegaly or masses felt. Normal bowel sounds heard. Central nervous system: Alert and oriented. No focal neurological deficits. Extremities: No edema. No calf tenderness Skin: No cyanosis. No rashes Psychiatry: Judgement and insight appear normal. Mood & affect appropriate.     Data Reviewed: I have  personally reviewed following labs and imaging studies  CBC: Recent Labs  Lab 06/03/19 1600  06/05/19 0608 06/06/19 0416 06/07/19 0400 06/08/19 0413 06/09/19 0656  WBC 6.4   < > 3.9* 4.0 3.9* 4.8 5.8  NEUTROABS 4.7   --   --   --   --   --   --   HGB 11.4*   < > 10.0* 10.6* 10.0* 10.3* 11.1*  HCT 35.1*   < > 31.4* 33.2* 30.9* 32.1* 32.8*  MCV 87.3   < > 88.2 87.6 87.8 88.4 84.8  PLT 81*   < > 74* 78* 81* 82* 92*   < > = values in this interval not displayed.   Basic Metabolic Panel: Recent Labs  Lab 06/05/19 0608 06/06/19 0416 06/07/19 0400 06/08/19 0413 06/09/19 0656  NA 132* 133* 132* 132* 132*  K 3.7 4.3 4.1 4.3 5.0  CL 102 100 97* 100 98  CO2 25 26 29 27 24   GLUCOSE 173* 138* 121* 138* 102*  BUN 21* 16 13 13 16   CREATININE 0.83 0.71 0.74 0.81 0.68  CALCIUM 7.5* 8.1* 8.0* 8.0* 8.2*   GFR: Estimated Creatinine Clearance: 106.3 mL/min (by C-G formula based on SCr of 0.68 mg/dL). Liver Function Tests: Recent Labs  Lab 06/03/19 1600  AST 25  ALT 26  ALKPHOS 207*  BILITOT 0.7  PROT 7.4  ALBUMIN 2.3*   No results for input(s): LIPASE, AMYLASE in the last 168 hours. No results for input(s): AMMONIA in the last 168 hours. Coagulation Profile: No results for input(s): INR, PROTIME in the last 168 hours. Cardiac Enzymes: Recent Labs  Lab 06/03/19 1600  TROPONINI <0.03   BNP (last 3 results) No results for input(s): PROBNP in the last 8760 hours. HbA1C: No results for input(s): HGBA1C in the last 72 hours. CBG: No results for input(s): GLUCAP in the last 168 hours. Lipid Profile: No results for input(s): CHOL, HDL, LDLCALC, TRIG, CHOLHDL, LDLDIRECT in the last 72 hours. Thyroid Function Tests: No results for input(s): TSH, T4TOTAL, FREET4, T3FREE, THYROIDAB in the last 72 hours. Anemia Panel: No results for input(s): VITAMINB12, FOLATE, FERRITIN, TIBC, IRON, RETICCTPCT in the last 72 hours. Sepsis Labs: No results for input(s): PROCALCITON, LATICACIDVEN in the last 168 hours.  Recent Results (from the past 240 hour(s))  SARS Coronavirus 2 (CEPHEID- Performed in [facility] hospital lab), Hosp Order     Status: None   Collection Time: 06/03/19  3:56 PM   Specimen:  Nasopharyngeal Swab  Result Value Ref Range Status   SARS Coronavirus 2 NEGATIVE NEGATIVE Final    Comment: (NOTE) If result is NEGATIVE SARS-CoV-2 target nucleic acids are NOT DETECTED. The SARS-CoV-2 RNA is generally detectable in upper and lower  respiratory specimens during the acute phase of infection. The lowest  concentration of SARS-CoV-2 viral copies this assay can detect is 250  copies / mL. A negative result does not preclude SARS-CoV-2 infection  and should not be used as the sole basis for treatment or other  patient management decisions.  A negative result may occur with  improper specimen collection / handling, submission of specimen other  than nasopharyngeal swab, presence of viral mutation(s) within the  areas targeted by this assay, and inadequate number of viral copies  (<250 copies / mL). A negative result must be combined with clinical  observations, patient history, and epidemiological information. If result is POSITIVE SARS-CoV-2 target nucleic acids are DETECTED. The SARS-CoV-2 RNA is generally detectable in upper and lower  respiratory specimens dur ing the acute phase of infection.  Positive  results are indicative of active infection with SARS-CoV-2.  Clinical  correlation with patient history and other diagnostic information is  necessary to determine patient infection status.  Positive results do  not rule out bacterial infection or co-infection with other viruses. If result is PRESUMPTIVE POSTIVE SARS-CoV-2 nucleic acids MAY BE PRESENT.   A presumptive positive result was obtained on the submitted specimen  and confirmed on repeat testing.  While 2019 novel coronavirus  (SARS-CoV-2) nucleic acids may be present in the submitted sample  additional confirmatory testing may be necessary for epidemiological  and / or clinical management purposes  to differentiate between  SARS-CoV-2 and other Sarbecovirus currently known to infect humans.  If clinically  indicated additional testing with an alternate test  methodology [phone] is advised. The SARS-CoV-2 RNA is generally  detectable in upper and lower respiratory sp ecimens during the acute  phase of infection. The expected result is Negative. Fact Sheet for Patients:  BoilerBrush.com.cyhttps://www.fda.gov/media/136312/download Fact Sheet for Healthcare Providers: https://pope.com/https://www.fda.gov/media/136313/download This test is not yet approved or cleared by the [place] FDA and has been authorized for detection and/or diagnosis of SARS-CoV-2 by FDA under an Emergency Use Authorization (EUA).  This EUA will remain in effect (meaning this test can be used) for the duration of the COVID-19 declaration under Section 564(b)(1) of the Act, 21 U.S.C. section 360bbb-3(b)(1), unless the authorization is terminated or revoked sooner. Performed at [facility], [address] [place], [place] [address]   Blood culture (routine x 2)     Status: None   Collection Time: 06/03/19  4:00 PM   Specimen: BLOOD RIGHT FOREARM  Result Value Ref Range Status   Specimen Description BLOOD RIGHT FOREARM  Final   Special Requests   Final    BOTTLES DRAWN AEROBIC AND ANAEROBIC Blood Culture adequate volume   Culture   Final    NO GROWTH 5 DAYS Performed at [facility], [address] [place], [place] [address]    Report Status 06/08/2019 FINAL  Final  Blood culture (routine x 2)     Status: None   Collection Time: 06/03/19  4:59 PM   Specimen: BLOOD RIGHT FOREARM  Result Value Ref Range Status   Specimen Description BLOOD RIGHT FOREARM  Final   Special Requests AEROBIC BOTTLE ONLY Blood Culture adequate volume  Final   Culture   Final    NO GROWTH 5 DAYS Performed at [facility], [address] [place], [place] [address]    Report Status 06/08/2019 FINAL  Final  Aerobic Culture (superficial specimen)     Status: None   Collection Time: 06/04/19  2:29 AM   Specimen: Abscess  Result Value Ref Range  Status   Specimen Description ABSCESS  Final   Special Requests NONE  Final   Gram Stain   Final    RARE WBC PRESENT, PREDOMINANTLY PMN RARE GRAM POSITIVE COCCI Performed at [facility], [address] [place], [place] [address]    Culture   Final    ABUNDANT METHICILLIN RESISTANT STAPHYLOCOCCUS AUREUS   Report Status 06/06/2019 FINAL  Final   Organism ID, Bacteria METHICILLIN RESISTANT STAPHYLOCOCCUS AUREUS  Final      Susceptibility   Methicillin resistant staphylococcus aureus - MIC*    CIPROFLOXACIN >=8 RESISTANT Resistant     ERYTHROMYCIN >=8 RESISTANT Resistant     GENTAMICIN <=0.5 SENSITIVE Sensitive  OXACILLIN >=4 RESISTANT Resistant     TETRACYCLINE <=1 SENSITIVE Sensitive     VANCOMYCIN 1 SENSITIVE Sensitive     TRIMETH/SULFA <=10 SENSITIVE Sensitive     CLINDAMYCIN <=0.25 SENSITIVE Sensitive     RIFAMPIN <=0.5 SENSITIVE Sensitive     Inducible Clindamycin NEGATIVE Sensitive     * ABUNDANT METHICILLIN RESISTANT STAPHYLOCOCCUS AUREUS         Radiology Studies: [place] Ekg Site Rite  Result Date: 06/07/2019 If Site Rite image not attached, placement could not be confirmed due to current cardiac rhythm.       Scheduled Meds: . feeding supplement (ENSURE ENLIVE)  237 mL Oral TID BM  . methocarbamol  500 mg Oral TID  . multivitamin with minerals  1 tablet Oral Daily  . nicotine  21 mg Transdermal Daily  . oxyCODONE  30 mg Oral Q12H  . pantoprazole  40 mg Oral Daily  . senna-docusate  1 tablet Oral BID  . sodium chloride flush  3 mL Intravenous Q12H  . Tenofovir Alafenamide Fumarate  1 tablet Oral Daily   Continuous Infusions: . vancomycin 1,000 mg (06/09/19 1034)     LOS: 5 days     Cordelia Poche, MD [facility] 06/09/2019, 11:22 AM  If 7PM-7AM, please contact night-coverage [email or web address]

## 2019-06-10 LAB — BASIC METABOLIC PANEL
Anion gap: 7 (ref 5–15)
BUN: 15 mg/dL (ref 6–20)
CO2: 28 mmol/L (ref 22–32)
Calcium: 8.3 mg/dL — ABNORMAL LOW (ref 8.9–10.3)
Chloride: 97 mmol/L — ABNORMAL LOW (ref 98–111)
Creatinine, Ser: 0.64 mg/dL (ref 0.61–1.24)
GFR calc Af Amer: >60 mL/min (ref >60)
GFR calc non Af Amer: >60 mL/min (ref >60)
Glucose, Bld: 148 mg/dL — ABNORMAL HIGH (ref 70–99)
Potassium: 4.4 mmol/L (ref 3.5–5.1)
Sodium: 132 mmol/L — ABNORMAL LOW (ref 135–145)

## 2019-06-10 LAB — CBC
HCT: 32.7 % — ABNORMAL LOW (ref 39.0–52.0)
Hemoglobin: 10.4 g/dL — ABNORMAL LOW (ref 13.0–17.0)
MCH: 28.3 pg (ref 26.0–34.0)
MCHC: 31.8 g/dL (ref 30.0–36.0)
MCV: 89.1 fL (ref 80.0–100.0)
Platelets: 86 10*3/uL — ABNORMAL LOW (ref 150–400)
RBC: 3.67 MIL/uL — ABNORMAL LOW (ref 4.22–5.81)
RDW: 14 % (ref 11.5–15.5)
WBC: 4.7 10*3/uL (ref 4.0–10.5)
nRBC: 0 % (ref 0.0–0.2)

## 2019-06-10 LAB — SODIUM, URINE, RANDOM: Sodium, Ur: 135 mmol/L

## 2019-06-10 LAB — OSMOLALITY, URINE: Osmolality, Ur: 475 mOsm/kg (ref 300–900)

## 2019-06-10 NOTE — Progress Notes (Signed)
PROGRESS NOTE   48 y.o. male with a history of anxiety, chronic low back pain, liver cirrhosis, depression, hepatitis B/C, polysubstance abuse.  Admitted with epidural abscess with associated septic arthritis leading to severe cauda equina compression.   disc laminectomy 6/21 by neurosurgery and abscess is growing MRSA.  Continuing 2/2 ID on Vancomycin IV X 8 weeks ending 07/31/2019   Assessment & Plan:   Principal Problem:   Cauda equina compression (HCC) Active Problems:   Chronic hepatitis B (HCC)   Acute urinary retention   Epidural abscess   Septic arthritis (HCC)   Osteomyelitis (HCC)   MRSA infection (methicillin-resistant Staphylococcus aureus)   Lumbar epidural abscess with MRSA growing from culture Severe cauda equina compression status post laminectomy 6/21 Septic arthritis of right L5-S1 facet joint, right SI joint/sacrum -Continue vancomycin IV till 08/03/2019 as above -Continue OxyContin, oxycodone IR as needed, Dilaudid as needed.  Will need to de-escalate narcotic and I have told him that after this weekend we will transition to p.o. opiates  Hyponatremia Mild.  Get urinary sodium and urinary awesome's  Elevated alkaline phosphatase Thought secondary to bone pathology. No clinical symptoms of cholecystitis. Has a history of distended gallbladder from CT abdomen last year.  History of polysubstance abuse Heroin use as an outpatient See above discussion regarding de-escalation of meds  Tobacco abuse -Continue nicotine patch  Anxiety Patient states he is prescribed Xanax 1 mg for this. Checked database and no prescription seen for Xanax -Atarax 25 mg TID prn I have asked social worker to check in on him to gauge level of interest in terms of cessation in addition to giving him resources for polysubstance abuse cessation  Cirrhosis Hepatitis B/C Appears stable clinically. E antigen is positive. Outpatient follow-up with GI and ID -Continue Tonofovir    DVT prophylaxis: Heparin subq Code Status:   Code Status: Full Code Family Communication: None Disposition Plan: Discharge after 8 weeks of IV Vancomycin   Consultants:   Neurosurgery  Infectious disease  Procedures:   6/21: L5-S1 decompressive laminectomy with evacuation of epidural abscess  Antimicrobials:  Ceftriaxone (6/20>>6/21)  Vancomycin (6/20>>  Tenofovir (6/21>>   Subjective: Awake Coherent Nursing relates patient asking for meds on the hour when needed No chest pain No fever Ambulatory in the room No nausea no vomiting eating   Objective: Vitals:   06/09/19 0416 06/09/19 2023 06/10/19 0538 06/10/19 1006  BP: 101/64 126/69 (!) 142/82 (!) 155/74  Pulse: 89 83 83 84  Resp: 16 16 18 16   Temp: 98.3 F (36.8 C) 98 F (36.7 C) 98.2 F (36.8 C) 98 F (36.7 C)  TempSrc: Oral Oral Oral Axillary  SpO2: 100% 100% 100% 100%  Weight:      Height:        Intake/Output Summary (Last 24 hours) at 06/10/2019 1238 Last data filed at 06/10/2019 1044 Gross per 24 hour  Intake 2039.72 ml  Output 2175 ml  Net -135.28 ml   Filed Weights   06/03/19 1452  Weight: 68 kg    Examination:  Awake coherent pleasant no distress flat affect Mild gynecomastia Tattoo Chest clear Abdomen soft obese nontender no rebound No lower extremity edema Skin shows scar in the middle of the lower lumbosacral area does not appear to be having any pus or any type of redness     Data Reviewed: I have personally reviewed following labs and imaging studies  CBC: Recent Labs  Lab 06/03/19 1600  06/06/19 0416 06/07/19 0400 06/08/19 0413 06/09/19 [id number]  06/10/19 0417  WBC 6.4   < > 4.0 3.9* 4.8 5.8 4.7  NEUTROABS 4.7  --   --   --   --   --   --   HGB 11.4*   < > 10.6* 10.0* 10.3* 11.1* 10.4*  HCT 35.1*   < > 33.2* 30.9* 32.1* 32.8* 32.7*  MCV 87.3   < > 87.6 87.8 88.4 84.8 89.1  PLT 81*   < > 78* 81* 82* 92* 86*   < > = values in this interval not displayed.   Basic  Metabolic Panel: Recent Labs  Lab 06/06/19 0416 06/07/19 0400 06/08/19 0413 06/09/19 0656 06/10/19 0417  NA 133* 132* 132* 132* 132*  K 4.3 4.1 4.3 5.0 4.4  CL 100 97* 100 98 97*  CO2 26 29 27 24 28   GLUCOSE 138* 121* 138* 102* 148*  BUN 16 13 13 16 15   CREATININE 0.71 0.74 0.81 0.68 0.64  CALCIUM 8.1* 8.0* 8.0* 8.2* 8.3*   GFR: Estimated Creatinine Clearance: 106.3 mL/min (by C-G formula based on SCr of 0.64 mg/dL). Liver Function Tests: Recent Labs  Lab 06/03/19 1600  AST 25  ALT 26  ALKPHOS 207*  BILITOT 0.7  PROT 7.4  ALBUMIN 2.3*   No results for input(s): LIPASE, AMYLASE in the last 168 hours. No results for input(s): AMMONIA in the last 168 hours. Coagulation Profile: No results for input(s): INR, PROTIME in the last 168 hours. Cardiac Enzymes: Recent Labs  Lab 06/03/19 1600  TROPONINI <0.03   BNP (last 3 results) No results for input(s): PROBNP in the last 8760 hours. HbA1C: No results for input(s): HGBA1C in the last 72 hours. CBG: No results for input(s): GLUCAP in the last 168 hours. Lipid Profile: No results for input(s): CHOL, HDL, LDLCALC, TRIG, CHOLHDL, LDLDIRECT in the last 72 hours. Thyroid Function Tests: No results for input(s): TSH, T4TOTAL, FREET4, T3FREE, THYROIDAB in the last 72 hours. Anemia Panel: No results for input(s): VITAMINB12, FOLATE, FERRITIN, TIBC, IRON, RETICCTPCT in the last 72 hours. Sepsis Labs: No results for input(s): PROCALCITON, LATICACIDVEN in the last 168 hours.  Recent Results (from the past 240 hour(s))  SARS Coronavirus 2 (CEPHEID- Performed in [facility] hospital lab), Hosp Order     Status: None   Collection Time: 06/03/19  3:56 PM   Specimen: Nasopharyngeal Swab  Result Value Ref Range Status   SARS Coronavirus 2 NEGATIVE NEGATIVE Final    Comment: (NOTE) If result is NEGATIVE SARS-CoV-2 target nucleic acids are NOT DETECTED. The SARS-CoV-2 RNA is generally detectable in upper and lower  respiratory  specimens during the acute phase of infection. The lowest  concentration of SARS-CoV-2 viral copies this assay can detect is 250  copies / mL. A negative result does not preclude SARS-CoV-2 infection  and should not be used as the sole basis for treatment or other  patient management decisions.  A negative result may occur with  improper specimen collection / handling, submission of specimen other  than nasopharyngeal swab, presence of viral mutation(s) within the  areas targeted by this assay, and inadequate number of viral copies  (<250 copies / mL). A negative result must be combined with clinical  observations, patient history, and epidemiological information. If result is POSITIVE SARS-CoV-2 target nucleic acids are DETECTED. The SARS-CoV-2 RNA is generally detectable in upper and lower  respiratory specimens dur ing the acute phase of infection.  Positive  results are indicative of active infection with SARS-CoV-2.  Clinical  correlation with patient history and other diagnostic information is  necessary to determine patient infection status.  Positive results do  not rule out bacterial infection or co-infection with other viruses. If result is PRESUMPTIVE POSTIVE SARS-CoV-2 nucleic acids MAY BE PRESENT.   A presumptive positive result was obtained on the submitted specimen  and confirmed on repeat testing.  While 2019 novel coronavirus  (SARS-CoV-2) nucleic acids may be present in the submitted sample  additional confirmatory testing may be necessary for epidemiological  and / or clinical management purposes  to differentiate between  SARS-CoV-2 and other Sarbecovirus currently known to infect humans.  If clinically indicated additional testing with an alternate test  methodology [phone]([id number] is advised. The SARS-CoV-2 RNA is generally  detectable in upper and lower respiratory sp ecimens during the acute  phase of infection. The expected result is Negative. Fact Sheet for  Patients:  BoilerBrush.com.cyhttps://www.fda.gov/media/136312/download Fact Sheet for Healthcare Providers: https://pope.com/https://www.fda.gov/media/136313/download This test is not yet approved or cleared by the [place] FDA and has been authorized for detection and/or diagnosis of SARS-CoV-2 by FDA under an Emergency Use Authorization (EUA).  This EUA will remain in effect (meaning this test can be used) for the duration of the COVID-19 declaration under Section 564(b)(1) of the Act, 21 U.S.C. section 360bbb-3(b)(1), unless the authorization is terminated or revoked sooner. Performed at [facility], [address] [place], [place] [address]   Blood culture (routine x 2)     Status: None   Collection Time: 06/03/19  4:00 PM   Specimen: BLOOD RIGHT FOREARM  Result Value Ref Range Status   Specimen Description BLOOD RIGHT FOREARM  Final   Special Requests   Final    BOTTLES DRAWN AEROBIC AND ANAEROBIC Blood Culture adequate volume   Culture   Final    NO GROWTH 5 DAYS Performed at [facility], [address] [place], [place] [address]    Report Status 06/08/2019 FINAL  Final  Blood culture (routine x 2)     Status: None   Collection Time: 06/03/19  4:59 PM   Specimen: BLOOD RIGHT FOREARM  Result Value Ref Range Status   Specimen Description BLOOD RIGHT FOREARM  Final   Special Requests AEROBIC BOTTLE ONLY Blood Culture adequate volume  Final   Culture   Final    NO GROWTH 5 DAYS Performed at [facility], [address] [place], [place] [address]    Report Status 06/08/2019 FINAL  Final  Aerobic Culture (superficial specimen)     Status: None   Collection Time: 06/04/19  2:29 AM   Specimen: Abscess  Result Value Ref Range Status   Specimen Description ABSCESS  Final   Special Requests NONE  Final   Gram Stain   Final    RARE WBC PRESENT, PREDOMINANTLY PMN RARE GRAM POSITIVE COCCI Performed at [facility], [address] [place], [place] [address]    Culture   Final     ABUNDANT METHICILLIN RESISTANT STAPHYLOCOCCUS AUREUS   Report Status 06/06/2019 FINAL  Final   Organism ID, Bacteria METHICILLIN RESISTANT STAPHYLOCOCCUS AUREUS  Final      Susceptibility   Methicillin resistant staphylococcus aureus - MIC*    CIPROFLOXACIN >=8 RESISTANT Resistant     ERYTHROMYCIN >=8 RESISTANT Resistant     GENTAMICIN <=0.5 SENSITIVE Sensitive     OXACILLIN >=4 RESISTANT Resistant     TETRACYCLINE <=1 SENSITIVE Sensitive     VANCOMYCIN 1 SENSITIVE Sensitive  TRIMETH/SULFA <=10 SENSITIVE Sensitive     CLINDAMYCIN <=0.25 SENSITIVE Sensitive     RIFAMPIN <=0.5 SENSITIVE Sensitive     Inducible Clindamycin NEGATIVE Sensitive     * ABUNDANT METHICILLIN RESISTANT STAPHYLOCOCCUS AUREUS         Radiology Studies: No results found.      Scheduled Meds: . feeding supplement (ENSURE ENLIVE)  237 mL Oral TID BM  . methocarbamol  500 mg Oral TID  . multivitamin with minerals  1 tablet Oral Daily  . nicotine  21 mg Transdermal Daily  . oxyCODONE  30 mg Oral Q12H  . pantoprazole  40 mg Oral Daily  . senna-docusate  1 tablet Oral BID  . sodium chloride flush  3 mL Intravenous Q12H  . Tenofovir Alafenamide Fumarate  1 tablet Oral Daily   Continuous Infusions: . vancomycin 1,000 mg (06/10/19 1022)     LOS: 6 days  Verneita Griffes, MD [facility] 12:38 PM    If 7PM-7AM, please contact night-coverage [email or web address]

## 2019-06-10 NOTE — Progress Notes (Signed)
Occupational Therapy Treatment Patient Details Name: Dan [name] MRN: [id number] DOB: Jul 04, 1971 Today's Date: 06/10/2019    History of present illness 48 y.o. male with medical history significant of anxiety, chronic low back pain, liver cirrhosis, depression, hep B, hep C, polysubstance abuse presenting with a chief complaint of generalized body aches. MRI findings included EPIDURAL ABSCESS, LUMBAR. Pt is now s/p LUMBAR LAMINECTOMY FOR EPIDURAL ABSCESS, L5-S1.   OT comments  Pt. Seen for skilled OT. Able to complete bed mobility, simulated toileting and tub transfer tasks with S. Reports he completes b/d each day without assistance.   Follow Up Recommendations  SNF    Equipment Recommendations       Recommendations for Other Services      Precautions / Restrictions Precautions Precautions: Fall       Mobility Bed Mobility Overal bed mobility: Needs Assistance Bed Mobility: Supine to Sit     Supine to sit: Supervision        Transfers Overall transfer level: Independent Equipment used: None Transfers: Sit to/from [employer]Stand;Stand Pivot Transfers Sit to Stand: Independent Stand pivot transfers: Independent            Balance                                           ADL either performed or assessed with clinical judgement   ADL Overall ADL's : Needs assistance/impaired           Upper Body Bathing Details (indicate cue type and reason): declined demo or review, states he completes independently each day   Lower Body Bathing Details (indicate cue type and reason): declined demo or review, states he completes independently each day   Upper Body Dressing Details (indicate cue type and reason): declined demo or review, states he completes independently each day   Lower Body Dressing Details (indicate cue type and reason): declined demo or review, states he completes independently each day         Tub/ Shower Transfer: Tub  Sports administratortransfer;Supervision/safety Tub/Shower Transfer Details (indicate cue type and reason): simulated with stepping over side of simulated tub ledge in/out of approx. height of reported tub Functional mobility during ADLs: Modified independent;Supervision/safety       Vision       Perception     Praxis      Cognition Arousal/Alertness: Awake/alert Behavior During Therapy: WFL for tasks assessed/performed;Impulsive Overall Cognitive Status: Within Functional Limits for tasks assessed                                          Exercises     Shoulder Instructions       General Comments      Pertinent Vitals/ Pain       Pain Assessment: No/denies pain  Home Living                                          Prior Functioning/Environment              Frequency  Min 2X/week        Progress Toward Goals  OT Goals(current goals can now be found in the care plan section)  Progress towards OT goals: Progressing toward goals     Plan Discharge plan remains appropriate;Frequency remains appropriate    Co-evaluation                 AM-PAC OT "6 Clicks" Daily Activity     Outcome Measure   Help from another person eating meals?: None Help from another person taking care of personal grooming?: None Help from another person toileting, which includes using toliet, bedpan, or urinal?: A Little Help from another person bathing (including washing, rinsing, drying)?: A Little Help from another person to put on and taking off regular upper body clothing?: A Little Help from another person to put on and taking off regular lower body clothing?: A Little 6 Click Score: 20    End of Session    OT Visit Diagnosis: Pain;Unsteadiness on feet (R26.81)   Activity Tolerance Patient tolerated treatment well   Patient Left in chair;with call bell/phone within reach   Nurse Communication          Time: 4034-7425 OT Time Calculation  (min): 8 min  Charges: OT General Charges $OT Visit: 1 Visit OT Treatments $Self Care/Home Management : 8-22 mins   Janice Coffin, COTA/L 06/10/2019, 2:13 PM

## 2019-06-10 NOTE — Progress Notes (Signed)
Patient asking how often he can get all PRN medications.  Attempted to educate patient that the orders were for when he truly needs them as opposed to timing them out to when we are permitted to give them.  Reminded patient of his discussion with MD Samtani this morning about eventually tapering narcotic medications off.  Will continue to monitor.

## 2019-06-11 LAB — CBC WITH DIFFERENTIAL/PLATELET
Abs Immature Granulocytes: 0.11 10*3/uL — ABNORMAL HIGH (ref 0.00–0.07)
Basophils Absolute: 0 10*3/uL (ref 0.0–0.1)
Basophils Relative: 0 %
Eosinophils Absolute: 0.1 10*3/uL (ref 0.0–0.5)
Eosinophils Relative: 1 %
HCT: 32.1 % — ABNORMAL LOW (ref 39.0–52.0)
Hemoglobin: 10.2 g/dL — ABNORMAL LOW (ref 13.0–17.0)
Immature Granulocytes: 2 %
Lymphocytes Relative: 23 %
Lymphs Abs: 1.2 10*3/uL (ref 0.7–4.0)
MCH: 28.5 pg (ref 26.0–34.0)
MCHC: 31.8 g/dL (ref 30.0–36.0)
MCV: 89.7 fL (ref 80.0–100.0)
Monocytes Absolute: 0.5 10*3/uL (ref 0.1–1.0)
Monocytes Relative: 10 %
Neutro Abs: 3.4 10*3/uL (ref 1.7–7.7)
Neutrophils Relative %: 64 %
Platelets: 98 10*3/uL — ABNORMAL LOW (ref 150–400)
RBC: 3.58 MIL/uL — ABNORMAL LOW (ref 4.22–5.81)
RDW: 13.9 % (ref 11.5–15.5)
WBC: 5.4 10*3/uL (ref 4.0–10.5)
nRBC: 0 % (ref 0.0–0.2)

## 2019-06-11 MED ORDER — HYDROMORPHONE HCL 2 MG PO TABS: 10.0000 mg | ORAL_TABLET | Freq: Four times a day (QID) | ORAL | Status: DC | PRN

## 2019-06-11 MED ORDER — HYDROMORPHONE HCL 2 MG PO TABS
10.0000 mg | ORAL_TABLET | Freq: Four times a day (QID) | ORAL | Status: DC | PRN
  Administered 2019-06-12 (×2): 10 mg via ORAL
  Filled 2019-06-11 (×3): qty 5

## 2019-06-11 NOTE — Plan of Care (Signed)
  Problem: Safety: Goal: Ability to remain free from injury will improve Outcome: Progressing   Problem: Skin Integrity: Goal: Risk for impaired skin integrity will decrease Outcome: Progressing   

## 2019-06-11 NOTE — Progress Notes (Signed)
Pharmacy Antibiotic Note  Dan [name] is a 48 y.o. male admitted on 06/03/2019 with epidural abscess.  Pharmacy has been consulted for Vancomycin dosing. Pt pos-op laminectomy with evacuation of abscess 6/21.   WBC WNL, Scr stable at 0.64. Currently therapeutic with calculated AUC 528.   Plan: Continue vancomycin 1000 mg IV q12h Vancomycin levels 6/25, further levels as indicated Current plan for 8 weeks of IV Vancomycin therapy, stop date 8/17  Height: 5\' 11"  (180.3 cm) Weight: 150 lb (68 kg) IBW/kg (Calculated) : 75.3  Temp (24hrs), Avg:98.4 F (36.9 C), Min:98.3 F (36.8 C), Max:98.4 F (36.9 C)  Recent Labs  Lab 06/06/19 0416 06/06/19 1038 06/07/19 0400 06/08/19 0413 06/08/19 1040 06/08/19 1257 06/09/19 0656 06/10/19 0417 06/11/19 0337  WBC 4.0  --  3.9* 4.8  --   --  5.8 4.7 5.4  CREATININE 0.71  --  0.74 0.81  --   --  0.68 0.64  --   VANCOTROUGH  --   --   --   --  10*  --   --   --   --   VANCOPEAK  --   --   --   --   --  35  --   --   --   VANCORANDOM 40 22  --   --   --   --   --   --   --     Estimated Creatinine Clearance: 106.3 mL/min (by C-G formula based on SCr of 0.64 mg/dL).    No Known Allergies  Antibiotics this admission: 6/21 CTX >>6/22 6/21 Vanc >> 6/21 Tenofovir>>  Dose adjustments this admission: 6/25 Vanc 1gm Q 8hr >> AUC 777, reduced to Vancomycin 1000mg  Q 12hr  Peak 35, Trough 10  Micro results: 6/21 AbscessCx: abundant MRSA 6/20 BCx: ng-final 6/20 Covid: neg  Minda Ditto PharmD [phone] Please use [facility] for clinical pharmacists numbers  06/11/2019      1:36 PM

## 2019-06-11 NOTE — Progress Notes (Signed)
PROGRESS NOTE   48 y.o. male21 with a history of anxiety, chronic low back pain, liver cirrhosis, depression, hepatitis B/C, polysubstance abuse.  Admitted with epidural abscess with associated septic arthritis leading to severe cauda equina compression.   disc laminectomy 6/21 by neurosurgery and abscess is growing MRSA.  Continuing 2/2 ID on Vancomycin IV X 8 weeks ending 07/31/2019   Assessment & Plan:   Principal Problem:   Cauda equina compression (HCC) Active Problems:   Chronic hepatitis B (HCC)   Acute urinary retention   Epidural abscess   Septic arthritis (HCC)   Osteomyelitis (HCC)   MRSA infection (methicillin-resistant Staphylococcus aureus)   Lumbar epidural abscess with MRSA growing from culture Severe cauda equina compression status post laminectomy 6/21 Septic arthritis of right L5-S1 facet joint, right SI joint/sacrum -Continue vancomycin IV till 08/03/2019 as above -Continue OxyContin, oxycodone IR as needed, Dilaudid as needed.  -will confirm with Pharmacy best dosing for oral conversion of opiates in the next 48 hours--he has been made aware of change to orals---we will titrate slowly upwards if needed, however he had no issues sitting straight up in bed today to show me his scar  Hyponatremia, likely from tea-toast potomania Mild.  UNa 135 and Uosm 475--appropriate Might need to cut back oral fluids-  Will d/w  patient After am labs  Cirrhosis Hepatitis B/C Elevated alkaline phosphatase Thought secondary to bone pathology. No clinical symptoms of cholecystitis. Has a history of distended gallbladder from CT abdomen last year. Viral loads 06/06/2019 --27,200,000--if interested can contact [facility] once therapy complete re: eradication  E antigen is positive.  -Continue Tenofovir  History of polysubstance abuse Heroin use as an outpatient See above discussion regarding de-escalation of meds  Tobacco abuse -Continue nicotine patch  Anxiety Patient states  he is prescribed Xanax 1 mg for this. Checked database and no prescription seen for Xanax -Atarax 25 mg TID prn  social worker pending re: check in on him to gauge level of interest in terms of cessation in addition to giving him resources for polysubstance abuse cessation  DVT prophylaxis: Heparin subq Code Status:   Code Status: Full Code Family Communication: None Disposition Plan: Discharge after 8 weeks of IV Vancomycin   Consultants:   Neurosurgery  Infectious disease  Procedures:   6/21: L5-S1 decompressive laminectomy with evacuation of epidural abscess  Antimicrobials:  Ceftriaxone (6/20>>6/21)  Vancomycin (6/20>>  Tenofovir (6/21>>  Subjective:  some pain, no distress  No fever no chills no n/v/cp  Objective: Vitals:   06/10/19 0538 06/10/19 1006 06/10/19 2000 06/11/19 0449  BP: (!) 142/82 (!) 155/74 116/72 114/69  Pulse: 83 84 91 (!) 102  Resp: 18 16 17 18   Temp: 98.2 F (36.8 C) 98 F (36.7 C) 98.3 F (36.8 C) 98.4 F (36.9 C)  TempSrc: Oral Axillary Oral Oral  SpO2: 100% 100% 98% 99%  Weight:      Height:        Intake/Output Summary (Last 24 hours) at 06/11/2019 1042 Last data filed at 06/11/2019 0954 Gross per 24 hour  Intake 4083.72 ml  Output 3020 ml  Net 1063.72 ml   Filed Weights   06/03/19 1452  Weight: 68 kg    Examination:   no distress flat affect Mild gynecomastiaTattoo Chest clear Abdomen soft obese nontender no rebound No lower extremity edema Able to SLR bilaterally without pain, power 5/5 Sits up without deficit or wincing  Data Reviewed: I have personally reviewed following labs and imaging studies  CBC:  Recent Labs  Lab 06/07/19 0400 06/08/19 0413 06/09/19 0656 06/10/19 0417 06/11/19 0337  WBC 3.9* 4.8 5.8 4.7 5.4  NEUTROABS  --   --   --   --  3.4  HGB 10.0* 10.3* 11.1* 10.4* 10.2*  HCT 30.9* 32.1* 32.8* 32.7* 32.1*  MCV 87.8 88.4 84.8 89.1 89.7  PLT 81* 82* 92* 86* 98*   Basic Metabolic Panel:  Recent Labs  Lab 06/06/19 0416 06/07/19 0400 06/08/19 0413 06/09/19 0656 06/10/19 0417  NA 133* 132* 132* 132* 132*  K 4.3 4.1 4.3 5.0 4.4  CL 100 97* 100 98 97*  CO2 26 29 27 24 28   GLUCOSE 138* 121* 138* 102* 148*  BUN 16 13 13 16 15   CREATININE 0.71 0.74 0.81 0.68 0.64  CALCIUM 8.1* 8.0* 8.0* 8.2* 8.3*   GFR: Estimated Creatinine Clearance: 106.3 mL/min (by C-G formula based on SCr of 0.64 mg/dL). Liver Function Tests: No results for input(s): AST, ALT, ALKPHOS, BILITOT, PROT, ALBUMIN in the last 168 hours. No results for input(s): LIPASE, AMYLASE in the last 168 hours. No results for input(s): AMMONIA in the last 168 hours. Coagulation Profile: No results for input(s): INR, PROTIME in the last 168 hours. Cardiac Enzymes: No results for input(s): CKTOTAL, CKMB, CKMBINDEX, TROPONINI in the last 168 hours. BNP (last 3 results) No results for input(s): PROBNP in the last 8760 hours. HbA1C: No results for input(s): HGBA1C in the last 72 hours. CBG: No results for input(s): GLUCAP in the last 168 hours. Lipid Profile: No results for input(s): CHOL, HDL, LDLCALC, TRIG, CHOLHDL, LDLDIRECT in the last 72 hours. Thyroid Function Tests: No results for input(s): TSH, T4TOTAL, FREET4, T3FREE, THYROIDAB in the last 72 hours. Anemia Panel: No results for input(s): VITAMINB12, FOLATE, FERRITIN, TIBC, IRON, RETICCTPCT in the last 72 hours. Sepsis Labs: No results for input(s): PROCALCITON, LATICACIDVEN in the last 168 hours.  Recent Results (from the past 240 hour(s))  SARS Coronavirus 2 (CEPHEID- Performed in [facility]), Hosp Order     Status: None   Collection Time: 06/03/19  3:56 PM   Specimen: Nasopharyngeal Swab  Result Value Ref Range Status   SARS Coronavirus 2 NEGATIVE NEGATIVE Final    Comment: (NOTE) If result is NEGATIVE SARS-CoV-2 target nucleic acids are NOT DETECTED. The SARS-CoV-2 RNA is generally detectable in upper and lower  respiratory  specimens during the acute phase of infection. The lowest  concentration of SARS-CoV-2 viral copies this assay can detect is 250  copies / mL. A negative result does not preclude SARS-CoV-2 infection  and should not be used as the sole basis for treatment or other  patient management decisions.  A negative result may occur with  improper specimen collection / handling, submission of specimen other  than nasopharyngeal swab, presence of viral mutation(s) within the  areas targeted by this assay, and inadequate number of viral copies  (<250 copies / mL). A negative result must be combined with clinical  observations, patient history, and epidemiological information. If result is POSITIVE SARS-CoV-2 target nucleic acids are DETECTED. The SARS-CoV-2 RNA is generally detectable in upper and lower  respiratory specimens dur ing the acute phase of infection.  Positive  results are indicative of active infection with SARS-CoV-2.  Clinical  correlation with patient history and other diagnostic information is  necessary to determine patient infection status.  Positive results do  not rule out bacterial infection or co-infection with other viruses. If result is PRESUMPTIVE POSTIVE SARS-CoV-2 nucleic acids  MAY BE PRESENT.   A presumptive positive result was obtained on the submitted specimen  and confirmed on repeat testing.  While 2019 novel coronavirus  (SARS-CoV-2) nucleic acids may be present in the submitted sample  additional confirmatory testing may be necessary for epidemiological  and / or clinical management purposes  to differentiate between  SARS-CoV-2 and other Sarbecovirus currently known to infect humans.  If clinically indicated additional testing with an alternate test  methodology [phone]([id number]) is advised. The SARS-CoV-2 RNA is generally  detectable in upper and lower respiratory sp ecimens during the acute  phase of infection. The expected result is Negative. Fact Sheet for  Patients:  BoilerBrush.com.cyhttps://www.fda.gov/media/136312/download Fact Sheet for Healthcare Providers: https://pope.com/https://www.fda.gov/media/136313/download This test is not yet approved or cleared by the [place] FDA and has been authorized for detection and/or diagnosis of SARS-CoV-2 by FDA under an Emergency Use Authorization (EUA).  This EUA will remain in effect (meaning this test can be used) for the duration of the COVID-19 declaration under Section 564(b)(1) of the Act, 21 U.S.C. section 360bbb-3(b)(1), unless the authorization is terminated or revoked sooner. Performed at [facility], [address] [place], [place] [address]   Blood culture (routine x 2)     Status: None   Collection Time: 06/03/19  4:00 PM   Specimen: BLOOD RIGHT FOREARM  Result Value Ref Range Status   Specimen Description BLOOD RIGHT FOREARM  Final   Special Requests   Final    BOTTLES DRAWN AEROBIC AND ANAEROBIC Blood Culture adequate volume   Culture   Final    NO GROWTH 5 DAYS Performed at [facility], [address] [place], [place] [address]    Report Status 06/08/2019 FINAL  Final  Blood culture (routine x 2)     Status: None   Collection Time: 06/03/19  4:59 PM   Specimen: BLOOD RIGHT FOREARM  Result Value Ref Range Status   Specimen Description BLOOD RIGHT FOREARM  Final   Special Requests AEROBIC BOTTLE ONLY Blood Culture adequate volume  Final   Culture   Final    NO GROWTH 5 DAYS Performed at [facility], [address] [place], [place] [address]    Report Status 06/08/2019 FINAL  Final  Aerobic Culture (superficial specimen)     Status: None   Collection Time: 06/04/19  2:29 AM   Specimen: Abscess  Result Value Ref Range Status   Specimen Description ABSCESS  Final   Special Requests NONE  Final   Gram Stain   Final    RARE WBC PRESENT, PREDOMINANTLY PMN RARE GRAM POSITIVE COCCI Performed at [facility], [address] [place], [place] [address]    Culture   Final     ABUNDANT METHICILLIN RESISTANT STAPHYLOCOCCUS AUREUS   Report Status 06/06/2019 FINAL  Final   Organism ID, Bacteria METHICILLIN RESISTANT STAPHYLOCOCCUS AUREUS  Final      Susceptibility   Methicillin resistant staphylococcus aureus - MIC*    CIPROFLOXACIN >=8 RESISTANT Resistant     ERYTHROMYCIN >=8 RESISTANT Resistant     GENTAMICIN <=0.5 SENSITIVE Sensitive     OXACILLIN >=4 RESISTANT Resistant     TETRACYCLINE <=1 SENSITIVE Sensitive     VANCOMYCIN 1 SENSITIVE Sensitive     TRIMETH/SULFA <=10 SENSITIVE Sensitive     CLINDAMYCIN <=0.25 SENSITIVE Sensitive     RIFAMPIN <=0.5 SENSITIVE Sensitive     Inducible Clindamycin NEGATIVE Sensitive     * ABUNDANT METHICILLIN RESISTANT STAPHYLOCOCCUS  AUREUS     Radiology Studies: No results found.  Scheduled Meds: . feeding supplement (ENSURE ENLIVE)  237 mL Oral TID BM  . methocarbamol  500 mg Oral TID  . multivitamin with minerals  1 tablet Oral Daily  . nicotine  21 mg Transdermal Daily  . oxyCODONE  30 mg Oral Q12H  . pantoprazole  40 mg Oral Daily  . senna-docusate  1 tablet Oral BID  . sodium chloride flush  3 mL Intravenous Q12H  . Tenofovir Alafenamide Fumarate  1 tablet Oral Daily   Continuous Infusions: . vancomycin 1,000 mg (06/11/19 1032)     LOS: 7 days  Verneita Griffes, MD [facility] 10:42 AM    If 7PM-7AM, please contact night-coverage [email or web address]

## 2019-06-12 LAB — RENAL FUNCTION PANEL
Albumin: 2.2 g/dL — ABNORMAL LOW (ref 3.5–5.0)
Anion gap: 8 (ref 5–15)
BUN: 13 mg/dL (ref 6–20)
CO2: 28 mmol/L (ref 22–32)
Calcium: 8.3 mg/dL — ABNORMAL LOW (ref 8.9–10.3)
Chloride: 97 mmol/L — ABNORMAL LOW (ref 98–111)
Creatinine, Ser: 0.6 mg/dL — ABNORMAL LOW (ref 0.61–1.24)
GFR calc Af Amer: >60 mL/min (ref >60)
GFR calc non Af Amer: >60 mL/min (ref >60)
Glucose, Bld: 96 mg/dL (ref 70–99)
Phosphorus: 5.3 mg/dL — ABNORMAL HIGH (ref 2.5–4.6)
Potassium: 4.5 mmol/L (ref 3.5–5.1)
Sodium: 133 mmol/L — ABNORMAL LOW (ref 135–145)

## 2019-06-12 MED ORDER — HYDROMORPHONE HCL 2 MG PO TABS
6.0000 mg | ORAL_TABLET | Freq: Four times a day (QID) | ORAL | Status: AC | PRN
  Administered 2019-06-12 – 2019-06-16 (×14): 6 mg via ORAL
  Filled 2019-06-12 (×14): qty 3

## 2019-06-12 NOTE — Progress Notes (Signed)
PROGRESS NOTE   48 y.o. male with a history of anxiety, chronic low back pain, liver cirrhosis, depression, hepatitis B/C, polysubstance abuse.  Admitted with epidural abscess with associated septic arthritis leading to severe cauda equina compression.   disc laminectomy 6/21 by neurosurgery and abscess is growing MRSA.  Continuing 2/2 ID on Vancomycin IV X 8 weeks ending 07/31/2019   Assessment & Plan:   Principal Problem:   Cauda equina compression (HCC) Active Problems:   Chronic hepatitis B (HCC)   Acute urinary retention   Epidural abscess   Septic arthritis (HCC)   Osteomyelitis (HCC)   MRSA infection (methicillin-resistant Staphylococcus aureus)   Lumbar epidural abscess with MRSA growing from culture Severe cauda equina compression status post laminectomy 6/21 Septic arthritis of right L5-S1 facet joint, right SI joint/sacrum -Continue vancomycin IV till 08/03/2019  -Delineated opiates and converted with assistance of pharmacy to hydromorphone 10 every 6 as needed pain will be cutting back OxyContin, IV Dilaudid but will keep as needed OxyIR on board -See below  Hyponatremia, likely from tea-toast potomania Mild.  UNa 135 and Uosm 475--appropriate Patient is drinking of fluid a lot we will monitor going forward  Cirrhosis Hepatitis B/C Elevated alkaline phosphatase Thought secondary to bone pathology. No clinical symptoms of cholecystitis. Has a history of distended gallbladder from CT abdomen last year. Viral loads 06/06/2019 --27,200,000--if interested can contact [facility] once therapy complete re: eradication  E antigen is positive.  -Continue Tenofovir  History of polysubstance abuse Heroin use as an outpatient I have explained to the patient once he is outside the 2-week mark we will probably convert him over to Suboxone/methadone-he is aware --nursing is aware and patient is aware that adjustments will only be made by daytime MD-care order placed to ensure that  this is followed   Tobacco abuse -Continue nicotine patch  Anxiety Patient states he is prescribed Xanax 1 mg for this. Checked database and no prescription seen for Xanax -Atarax 25 mg TID prn  social worker pending re: check in on him to gauge level of interest in terms of cessation in addition to giving him resources for polysubstance abuse cessation  DVT prophylaxis: Heparin subq Code Status:   Code Status: Full Code Family Communication: None Disposition Plan: Discharge after 8 weeks of IV Vancomycin   Consultants:   Neurosurgery  Infectious disease  Procedures:   6/21: L5-S1 decompressive laminectomy with evacuation of epidural abscess  Antimicrobials:  Ceftriaxone (6/20>>6/21)  Vancomycin (6/20>>  Tenofovir (6/21>>  Subjective:  Fair walking around no distress Wants to shower Wound has not been reevaluated by neurosurgery in a while No chest pain  Objective: Vitals:   06/11/19 0449 06/11/19 1408 06/11/19 2123 06/12/19 0447  BP: 114/69 109/66 131/66 120/70  Pulse: (!) 102 97 87 97  Resp: 18 17 16 18   Temp: 98.4 F (36.9 C) (!) 97.5 F (36.4 C) 98.3 F (36.8 C) 98.3 F (36.8 C)  TempSrc: Oral Oral Oral Oral  SpO2: 99% 98% 98% 98%  Weight:      Height:        Intake/Output Summary (Last 24 hours) at 06/12/2019 1240 Last data filed at 06/12/2019 0900 Gross per 24 hour  Intake 1608 ml  Output 1850 ml  Net -242 ml   Filed Weights   06/03/19 1452  Weight: 68 kg    Examination:  no distress flat affect Mild gynecomastiaTattoo Chest clear Abdomen soft obese nontender no rebound No lower extremity edema Able to SLR bilaterally without pain,  power 5/5 Sits up without deficit or wincing  Data Reviewed: I have personally reviewed following labs and imaging studies  CBC: Recent Labs  Lab 06/07/19 0400 06/08/19 0413 06/09/19 0656 06/10/19 0417 06/11/19 0337  WBC 3.9* 4.8 5.8 4.7 5.4  NEUTROABS  --   --   --   --  3.4  HGB 10.0* 10.3*  11.1* 10.4* 10.2*  HCT 30.9* 32.1* 32.8* 32.7* 32.1*  MCV 87.8 88.4 84.8 89.1 89.7  PLT 81* 82* 92* 86* 98*   Basic Metabolic Panel: Recent Labs  Lab 06/07/19 0400 06/08/19 0413 06/09/19 0656 06/10/19 0417 06/12/19 0606  NA 132* 132* 132* 132* 133*  K 4.1 4.3 5.0 4.4 4.5  CL 97* 100 98 97* 97*  CO2 29 27 24 28 28   GLUCOSE 121* 138* 102* 148* 96  BUN 13 13 16 15 13   CREATININE 0.74 0.81 0.68 0.64 0.60*  CALCIUM 8.0* 8.0* 8.2* 8.3* 8.3*  PHOS  --   --   --   --  5.3*   GFR: Estimated Creatinine Clearance: 106.3 mL/min (A) (by C-G formula based on SCr of 0.6 mg/dL (L)). Liver Function Tests: Recent Labs  Lab 06/12/19 0606  ALBUMIN 2.2*   No results for input(s): LIPASE, AMYLASE in the last 168 hours. No results for input(s): AMMONIA in the last 168 hours. Coagulation Profile: No results for input(s): INR, PROTIME in the last 168 hours. Cardiac Enzymes: No results for input(s): CKTOTAL, CKMB, CKMBINDEX, TROPONINI in the last 168 hours. BNP (last 3 results) No results for input(s): PROBNP in the last 8760 hours. HbA1C: No results for input(s): HGBA1C in the last 72 hours. CBG: No results for input(s): GLUCAP in the last 168 hours. Lipid Profile: No results for input(s): CHOL, HDL, LDLCALC, TRIG, CHOLHDL, LDLDIRECT in the last 72 hours. Thyroid Function Tests: No results for input(s): TSH, T4TOTAL, FREET4, T3FREE, THYROIDAB in the last 72 hours. Anemia Panel: No results for input(s): VITAMINB12, FOLATE, FERRITIN, TIBC, IRON, RETICCTPCT in the last 72 hours. Sepsis Labs: No results for input(s): PROCALCITON, LATICACIDVEN in the last 168 hours.  Recent Results (from the past 240 hour(s))  SARS Coronavirus 2 (CEPHEID- Performed in [facility] lab), Hosp Order     Status: None   Collection Time: 06/03/19  3:56 PM   Specimen: Nasopharyngeal Swab  Result Value Ref Range Status   SARS Coronavirus 2 NEGATIVE NEGATIVE Final    Comment: (NOTE) If result is NEGATIVE  SARS-CoV-2 target nucleic acids are NOT DETECTED. The SARS-CoV-2 RNA is generally detectable in upper and lower  respiratory specimens during the acute phase of infection. The lowest  concentration of SARS-CoV-2 viral copies this assay can detect is 250  copies / mL. A negative result does not preclude SARS-CoV-2 infection  and should not be used as the sole basis for treatment or other  patient management decisions.  A negative result may occur with  improper specimen collection / handling, submission of specimen other  than nasopharyngeal swab, presence of viral mutation(s) within the  areas targeted by this assay, and inadequate number of viral copies  (<250 copies / mL). A negative result must be combined with clinical  observations, patient history, and epidemiological information. If result is POSITIVE SARS-CoV-2 target nucleic acids are DETECTED. The SARS-CoV-2 RNA is generally detectable in upper and lower  respiratory specimens dur ing the acute phase of infection.  Positive  results are indicative of active infection with SARS-CoV-2.  Clinical  correlation with patient history  and other diagnostic information is  necessary to determine patient infection status.  Positive results do  not rule out bacterial infection or co-infection with other viruses. If result is PRESUMPTIVE POSTIVE SARS-CoV-2 nucleic acids MAY BE PRESENT.   A presumptive positive result was obtained on the submitted specimen  and confirmed on repeat testing.  While 2019 novel coronavirus  (SARS-CoV-2) nucleic acids may be present in the submitted sample  additional confirmatory testing may be necessary for epidemiological  and / or clinical management purposes  to differentiate between  SARS-CoV-2 and other Sarbecovirus currently known to infect humans.  If clinically indicated additional testing with an alternate test  methodology [phone]([id number]) is advised. The SARS-CoV-2 RNA is generally  detectable in upper  and lower respiratory sp ecimens during the acute  phase of infection. The expected result is Negative. Fact Sheet for Patients:  BoilerBrush.com.cyhttps://www.fda.gov/media/136312/download Fact Sheet for Healthcare Providers: https://pope.com/https://www.fda.gov/media/136313/download This test is not yet approved or cleared by the [place] FDA and has been authorized for detection and/or diagnosis of SARS-CoV-2 by FDA under an Emergency Use Authorization (EUA).  This EUA will remain in effect (meaning this test can be used) for the duration of the COVID-19 declaration under Section 564(b)(1) of the Act, 21 U.S.C. section 360bbb-3(b)(1), unless the authorization is terminated or revoked sooner. Performed at [facility], [address] [place], [place] [address]   Blood culture (routine x 2)     Status: None   Collection Time: 06/03/19  4:00 PM   Specimen: BLOOD RIGHT FOREARM  Result Value Ref Range Status   Specimen Description BLOOD RIGHT FOREARM  Final   Special Requests   Final    BOTTLES DRAWN AEROBIC AND ANAEROBIC Blood Culture adequate volume   Culture   Final    NO GROWTH 5 DAYS Performed at [facility], [address] [place], [place] [address]    Report Status 06/08/2019 FINAL  Final  Blood culture (routine x 2)     Status: None   Collection Time: 06/03/19  4:59 PM   Specimen: BLOOD RIGHT FOREARM  Result Value Ref Range Status   Specimen Description BLOOD RIGHT FOREARM  Final   Special Requests AEROBIC BOTTLE ONLY Blood Culture adequate volume  Final   Culture   Final    NO GROWTH 5 DAYS Performed at [facility], [address] [place], [place] [address]    Report Status 06/08/2019 FINAL  Final  Aerobic Culture (superficial specimen)     Status: None   Collection Time: 06/04/19  2:29 AM   Specimen: Abscess  Result Value Ref Range Status   Specimen Description ABSCESS  Final   Special Requests NONE  Final   Gram Stain   Final    RARE WBC PRESENT, PREDOMINANTLY PMN  RARE GRAM POSITIVE COCCI Performed at [facility], [address] [place], [place] [address]    Culture   Final    ABUNDANT METHICILLIN RESISTANT STAPHYLOCOCCUS AUREUS   Report Status 06/06/2019 FINAL  Final   Organism ID, Bacteria METHICILLIN RESISTANT STAPHYLOCOCCUS AUREUS  Final      Susceptibility   Methicillin resistant staphylococcus aureus - MIC*    CIPROFLOXACIN >=8 RESISTANT Resistant     ERYTHROMYCIN >=8 RESISTANT Resistant     GENTAMICIN <=0.5 SENSITIVE Sensitive     OXACILLIN >=4 RESISTANT Resistant     TETRACYCLINE <=1 SENSITIVE Sensitive     VANCOMYCIN 1 SENSITIVE Sensitive     TRIMETH/SULFA <=10  SENSITIVE Sensitive     CLINDAMYCIN <=0.25 SENSITIVE Sensitive     RIFAMPIN <=0.5 SENSITIVE Sensitive     Inducible Clindamycin NEGATIVE Sensitive     * ABUNDANT METHICILLIN RESISTANT STAPHYLOCOCCUS AUREUS     Radiology Studies: No results found.  Scheduled Meds: . feeding supplement (ENSURE ENLIVE)  237 mL Oral TID BM  . methocarbamol  500 mg Oral TID  . multivitamin with minerals  1 tablet Oral Daily  . nicotine  21 mg Transdermal Daily  . oxyCODONE  30 mg Oral Q12H  . pantoprazole  40 mg Oral Daily  . senna-docusate  1 tablet Oral BID  . sodium chloride flush  3 mL Intravenous Q12H  . Tenofovir Alafenamide Fumarate  1 tablet Oral Daily   Continuous Infusions: . vancomycin 1,000 mg (06/12/19 0933)     LOS: 8 days  Pleas KochJai Fallou Hulbert, MD [facility] 12:40 PM    If 7PM-7AM, please contact night-coverage [email or web address]

## 2019-06-12 NOTE — Progress Notes (Signed)
Physical Therapy Discharge Patient Details Name: Dan [name] MRN: [id number] DOB: 1971-02-21 Today's Date: 06/12/2019 Time: 4008-6761 PT Time Calculation (min) (ACUTE ONLY): 9 min  Patient discharged from PT services secondary to goals met and no further PT needs identified.  Please see latest therapy progress note for current level of functioning and progress toward goals.    Progress and discharge plan discussed with patient and/or caregiver: Patient agrees with plan.   06/12/2019, 3:51 PM   Physical Therapy Treatment Patient Details Name: Dan [name] MRN: [id number] DOB: [birth year] Today's Date: 06/12/2019    History of Present Illness 48 y.o. male with medical history significant of anxiety, chronic low back pain, liver cirrhosis, depression, hep B, hep C, polysubstance abuse presenting with a chief complaint of generalized body aches. MRI findings included EPIDURAL ABSCESS, LUMBAR. Pt is now s/p LUMBAR LAMINECTOMY FOR EPIDURAL ABSCESS, L5-S1.    PT Comments    Pt agreeable to gait and stair training today.  Pt able to ambulate 600 ft and ascend / descend 6 stairs independently and without difficulty.  Pt has met all goals and is agreeable to d/c from PT.   Follow Up Recommendations  No PT follow up(discussed with Westley Hummer, LCSWA re: updated plan)     Equipment Recommendations  None recommended by PT    Recommendations for Other Services       Precautions / Restrictions Precautions Precautions: Fall Required Braces or Orthoses: (no brace needed) Restrictions Weight Bearing Restrictions: No    Mobility  Bed Mobility Overal bed mobility: Needs Assistance Bed Mobility: Supine to Sit   Sidelying to sit: Independent Supine to sit: Independent     General bed mobility comments: pt sitting up in bed upon arrival  Transfers Overall transfer level: Independent Equipment used: None Transfers: Sit to/from [employer] to Stand:  Independent Stand pivot transfers: Independent          Ambulation/Gait Ambulation/Gait assistance: Modified independent (Device/Increase time) Gait Distance (Feet): 600 Feet Assistive device: None Gait Pattern/deviations: Narrow base of support;Step-through pattern     General Gait Details: Patient walks quickly.    Stairs Stairs: Yes Stairs assistance: Supervision Stair Management: One rail Left;Alternating pattern Number of Stairs: 6 General stair comments: Pt able to ascend/ descend stairs with single rail without difficulty.    Wheelchair Mobility    Modified Rankin (Stroke Patients Only)       Balance Overall balance assessment: no assistance needed.  Sitting-balance support: Feet supported;No upper extremity supported Sitting balance-Leahy Scale: Good     Standing balance support: No upper extremity supported Standing balance-Leahy Scale: Good                              Cognition Arousal/Alertness: Awake/alert Behavior During Therapy: WFL for tasks assessed/performed;Impulsive Overall Cognitive Status: Within Functional Limits for tasks assessed                                  Exercises   verbally reviewed.    General Comments        Pertinent Vitals/Pain Faces Pain Scale: Hurts a little bit Pain Location: back  Pain Descriptors / Indicators: Discomfort Pain Intervention(s): Limited activity within patient's tolerance;Monitored during session    Home Living  Prior Function            PT Goals (current goals can now be found in the care plan section) Acute Rehab PT Goals Patient Stated Goal: did not state PT Goal Formulation: With patient Time For Goal Achievement: 06/18/19 Potential to Achieve Goals: Good Progress towards PT goals: Goals met/education completed, patient discharged from PT    Frequency    Min 3X/week      PT Plan Current plan remains appropriate     Co-evaluation PT/OT/SLP Co-Evaluation/Treatment: Yes   PT goals addressed during session: Mobility/safety with mobility        AM-PAC PT "6 Clicks" Mobility   Outcome Measure  Help needed turning from your back to your side while in a flat bed without using bedrails?: None Help needed moving from lying on your back to sitting on the side of a flat bed without using bedrails?: None Help needed moving to and from a bed to a chair (including a wheelchair)?: None Help needed standing up from a chair using your arms (e.g., wheelchair or bedside chair)?: None Help needed to walk in hospital room?: None Help needed climbing 3-5 steps with a railing? : None 6 Click Score: 24    End of Session   Activity Tolerance: Patient tolerated treatment well;No increased pain Patient left: in bed;with call bell/phone within reach Nurse Communication: Mobility status PT Visit Diagnosis: Unsteadiness on feet (R26.81);Other abnormalities of gait and mobility (R26.89);Muscle weakness (generalized) (M62.81);Difficulty in walking, not elsewhere classified (R26.2);Other symptoms and signs involving the nervous system (R29.898)     Time: 4196-2229 PT Time Calculation (min) (ACUTE ONLY): 9 min  Charges:  $Gait Training: 8-22 mins                    Kerin Perna, PTA Acute Rehab [phone]

## 2019-06-13 MED ORDER — OXYCODONE HCL 5 MG PO TABS
5.0000 mg | ORAL_TABLET | Freq: Three times a day (TID) | ORAL | Status: DC | PRN
  Administered 2019-06-13: 19:00:00 5 mg via ORAL
  Filled 2019-06-13: qty 1

## 2019-06-13 NOTE — Progress Notes (Signed)
PROGRESS NOTE   48 y.o. male with a history of anxiety, chronic low back pain, liver cirrhosis, depression, hepatitis B/C, polysubstance abuse.  Admitted with epidural abscess with associated septic arthritis leading to severe cauda equina compression.   disc laminectomy 6/21 by neurosurgery and abscess is growing MRSA.  Continuing 2/2 ID on Vancomycin IV X 8 weeks ending 07/31/2019   Assessment & Plan:   Principal Problem:   Cauda equina compression (HCC) Active Problems:   Chronic hepatitis B (HCC)   Acute urinary retention   Epidural abscess   Septic arthritis (HCC)   Osteomyelitis (HCC)   MRSA infection (methicillin-resistant Staphylococcus aureus)   Lumbar epidural abscess with MRSA growing from culture Severe cauda equina compression status post laminectomy 6/21 Septic arthritis of right L5-S1 facet joint, right SI joint/sacrum -Continue vancomycin IV till 08/03/2019  -now hydromorphone 6 every 6 -->wean t methadone by end of week -See below  Hyponatremia, likely from tea-toast potomania Mild.  UNa 135 and Uosm 475--appropriate  Cirrhosis Hepatitis B/C Elevated alkaline phosphatase Thought secondary to bone pathology. No clinical symptoms of cholecystitis. Has a history of distended gallbladder from CT abdomen last year. Viral loads 06/06/2019 --27,200,000--if interested can contact [facility] once therapy complete re: eradication  E antigen is positive.  -Continue Tenofovir  History of polysubstance abuse Heroin use as an outpatient I have explained to the patient once he is outside the 2-week mark we will probably convert him over to Suboxone/methadone-he is aware --nursing is aware and patient is aware that adjustments will only be made by daytime MD-care order placed to ensure that this is followed   Tobacco abuse -Continue nicotine patch  Anxiety Patient states he is prescribed Xanax 1 mg for this. Checked database and no prescription seen for Xanax -Atarax 25 mg  TID prn  social worker pending re: check in on him to gauge level of interest in terms of cessation in addition to giving him resources for polysubstance abuse cessation  DVT prophylaxis: Heparin subq Code Status:   Code Status: Full Code Family Communication: None Disposition Plan: Discharge after 8 weeks of IV Vancomycin   Consultants:   Neurosurgery  Infectious disease  Procedures:   6/21: L5-S1 decompressive laminectomy with evacuation of epidural abscess  Antimicrobials:  Ceftriaxone (6/20>>6/21)  Vancomycin (6/20>>  Tenofovir (6/21>>  Subjective:  No issues Still wishes to shower No cp no fever  Objective: Vitals:   06/12/19 1506 06/12/19 2036 06/13/19 0424 06/13/19 0453  BP: 132/66 139/69 (!) 136/118 (!) 128/57  Pulse: 93 96 92 85  Resp: 17 18 18    Temp: 98.2 F (36.8 C) 98.3 F (36.8 C) 97.9 F (36.6 C)   TempSrc: Oral Oral Oral   SpO2: 100% 100% 100%   Weight:      Height:        Intake/Output Summary (Last 24 hours) at 06/13/2019 1604 Last data filed at 06/13/2019 1000 Gross per 24 hour  Intake 1405.35 ml  Output 250 ml  Net 1155.35 ml   Filed Weights   06/03/19 1452  Weight: 68 kg    Examination:  No changes 06/13/19   no distress flat affect Mild gynecomastiaTattoo Chest clear Abdomen soft obese nontender no rebound No lower extremity edema Able to SLR bilaterally without pain, power 5/5 Sits up without deficit or wincing  Data Reviewed: I have personally reviewed following labs and imaging studies  CBC: Recent Labs  Lab 06/07/19 0400 06/08/19 0413 06/09/19 0656 06/10/19 0417 06/11/19 0337  WBC 3.9* 4.8  5.8 4.7 5.4  NEUTROABS  --   --   --   --  3.4  HGB 10.0* 10.3* 11.1* 10.4* 10.2*  HCT 30.9* 32.1* 32.8* 32.7* 32.1*  MCV 87.8 88.4 84.8 89.1 89.7  PLT 81* 82* 92* 86* 98*   Basic Metabolic Panel: Recent Labs  Lab 06/07/19 0400 06/08/19 0413 06/09/19 0656 06/10/19 0417 06/12/19 0606  NA 132* 132* 132* 132*  133*  K 4.1 4.3 5.0 4.4 4.5  CL 97* 100 98 97* 97*  CO2 29 27 24 28 28   GLUCOSE 121* 138* 102* 148* 96  BUN 13 13 16 15 13   CREATININE 0.74 0.81 0.68 0.64 0.60*  CALCIUM 8.0* 8.0* 8.2* 8.3* 8.3*  PHOS  --   --   --   --  5.3*   GFR: Estimated Creatinine Clearance: 106.3 mL/min (A) (by C-G formula based on SCr of 0.6 mg/dL (L)). Liver Function Tests: Recent Labs  Lab 06/12/19 0606  ALBUMIN 2.2*   No results for input(s): LIPASE, AMYLASE in the last 168 hours. No results for input(s): AMMONIA in the last 168 hours. Coagulation Profile: No results for input(s): INR, PROTIME in the last 168 hours. Cardiac Enzymes: No results for input(s): CKTOTAL, CKMB, CKMBINDEX, TROPONINI in the last 168 hours. BNP (last 3 results) No results for input(s): PROBNP in the last 8760 hours. HbA1C: No results for input(s): HGBA1C in the last 72 hours. CBG: No results for input(s): GLUCAP in the last 168 hours. Lipid Profile: No results for input(s): CHOL, HDL, LDLCALC, TRIG, CHOLHDL, LDLDIRECT in the last 72 hours. Thyroid Function Tests: No results for input(s): TSH, T4TOTAL, FREET4, T3FREE, THYROIDAB in the last 72 hours. Anemia Panel: No results for input(s): VITAMINB12, FOLATE, FERRITIN, TIBC, IRON, RETICCTPCT in the last 72 hours. Sepsis Labs: No results for input(s): PROCALCITON, LATICACIDVEN in the last 168 hours.  Recent Results (from the past 240 hour(s))  Blood culture (routine x 2)     Status: None   Collection Time: 06/03/19  4:59 PM   Specimen: BLOOD RIGHT FOREARM  Result Value Ref Range Status   Specimen Description BLOOD RIGHT FOREARM  Final   Special Requests AEROBIC BOTTLE ONLY Blood Culture adequate volume  Final   Culture   Final    NO GROWTH 5 DAYS Performed at [facility], [address] [place], [place] [address]    Report Status 06/08/2019 FINAL  Final  Aerobic Culture (superficial specimen)     Status: None   Collection Time: 06/04/19  2:29 AM   Specimen:  Abscess  Result Value Ref Range Status   Specimen Description ABSCESS  Final   Special Requests NONE  Final   Gram Stain   Final    RARE WBC PRESENT, PREDOMINANTLY PMN RARE GRAM POSITIVE COCCI Performed at [facility], [address] [place], [place] [address]    Culture   Final    ABUNDANT METHICILLIN RESISTANT STAPHYLOCOCCUS AUREUS   Report Status 06/06/2019 FINAL  Final   Organism ID, Bacteria METHICILLIN RESISTANT STAPHYLOCOCCUS AUREUS  Final      Susceptibility   Methicillin resistant staphylococcus aureus - MIC*    CIPROFLOXACIN >=8 RESISTANT Resistant     ERYTHROMYCIN >=8 RESISTANT Resistant     GENTAMICIN <=0.5 SENSITIVE Sensitive     OXACILLIN >=4 RESISTANT Resistant     TETRACYCLINE <=1 SENSITIVE Sensitive     VANCOMYCIN 1 SENSITIVE Sensitive     TRIMETH/SULFA <=10 SENSITIVE Sensitive     CLINDAMYCIN <=  0.25 SENSITIVE Sensitive     RIFAMPIN <=0.5 SENSITIVE Sensitive     Inducible Clindamycin NEGATIVE Sensitive     * ABUNDANT METHICILLIN RESISTANT STAPHYLOCOCCUS AUREUS     Radiology Studies: No results found.  Scheduled Meds: . feeding supplement (ENSURE ENLIVE)  237 mL Oral TID BM  . methocarbamol  500 mg Oral TID  . multivitamin with minerals  1 tablet Oral Daily  . nicotine  21 mg Transdermal Daily  . pantoprazole  40 mg Oral Daily  . senna-docusate  1 tablet Oral BID  . sodium chloride flush  3 mL Intravenous Q12H  . Tenofovir Alafenamide Fumarate  1 tablet Oral Daily   Continuous Infusions: . vancomycin 1,000 mg (06/13/19 1054)     LOS: 9 days  Verneita Griffes, MD [facility] 4:04 PM    If 7PM-7AM, please contact night-coverage [email or web address]

## 2019-06-14 LAB — CREATININE, SERUM
Creatinine, Ser: 0.81 mg/dL (ref 0.61–1.24)
GFR calc Af Amer: >60 mL/min (ref >60)
GFR calc non Af Amer: >60 mL/min (ref >60)

## 2019-06-14 NOTE — Progress Notes (Signed)
Pharmacy Antibiotic Note  Dan [name] is a 48 y.o. male admitted on 06/03/2019 with epidural abscess.  Pharmacy has been consulted for Vancomycin dosing. S/p laminectomy 6/21. Plan for 8 week course of antibiotics per ID.    Plan: Continue Vancomycin 1000 mg IV q12h Plan for Vancomycin peak/trough levels tomorrow  Monitor renal function, clinical course  Height: 5\' 11"  (180.3 cm) Weight: 150 lb (68 kg) IBW/kg (Calculated) : 75.3  No data recorded.  Recent Labs  Lab 06/08/19 0413 06/08/19 1040 06/08/19 1257 06/09/19 0656 06/10/19 0417 06/11/19 0337 06/12/19 0606 06/14/19 0423  WBC 4.8  --   --  5.8 4.7 5.4  --   --   CREATININE 0.81  --   --  0.68 0.64  --  0.60* 0.81  VANCOTROUGH  --  10*  --   --   --   --   --   --   VANCOPEAK  --   --  35  --   --   --   --   --     Estimated Creatinine Clearance: 104.9 mL/min (by C-G formula based on SCr of 0.81 mg/dL).    No Known Allergies  Antibiotics this admission: 6/21 CTX >> 6/22 6/21 Vancomycin >> PTA Tenofovir >>  Microbiology results: 6/21 Abscess cx: abundant MRSA 6/20 BCx: NGF 6/20 COVID: neg    Lindell Spar, PharmD, BCPS Clinical Pharmacist  06/14/2019 9:23 AM

## 2019-06-14 NOTE — Progress Notes (Signed)
PROGRESS NOTE   48 y.o. male with a history of anxiety, chronic low back pain, liver cirrhosis, depression, hepatitis B/C, polysubstance abuse.  Admitted with epidural abscess with associated septic arthritis leading to severe cauda equina compression.   disc laminectomy 6/21 by neurosurgery and abscess is growing MRSA.  Continuing 2/2 ID on Vancomycin IV X 8 weeks ending 07/31/2019   Assessment & Plan:   Principal Problem:   Cauda equina compression (HCC) Active Problems:   Chronic hepatitis B (HCC)   Acute urinary retention   Epidural abscess   Septic arthritis (HCC)   Osteomyelitis (HCC)   MRSA infection (methicillin-resistant Staphylococcus aureus)   Lumbar epidural abscess with MRSA growing from culture Severe cauda equina compression status post laminectomy 6/21 Septic arthritis of right L5-S1 facet joint, right SI joint/sacrum -Continue vancomycin IV till 08/03/2019  -now hydromorphone 6 every 6hr -->wean to methadone by end of week -See below  Hyponatremia, likely from tea-toast potomania Mild.  UNa 135 and Uosm 475--appropriate  Cirrhosis Hepatitis B/C Elevated alkaline phosphatase Thought secondary to bone pathology. No clinical symptoms of cholecystitis. Has a history of distended gallbladder from CT abdomen last year. Viral loads 06/06/2019 --27,200,000--if interested can contact [facility] once therapy complete re: eradication  E antigen is positive.  -Continue Tenofovir  History of polysubstance abuse Heroin use as an outpatient  once he is outside the 2-week mark we will probably convert him over to Suboxone/methadone-he is aware --nursing is aware and patient is aware that adjustments will only be made by daytime MD-care order placed to ensure that this is followed   Tobacco abuse -Continue nicotine patch  Anxiety Patient states he is prescribed Xanax 1 mg for this. Checked database and no prescription seen for Xanax -Atarax 25 mg TID prn  social worker  pending re: check in on him to gauge level of interest in terms of cessation in addition to giving him resources for polysubstance abuse cessation  DVT prophylaxis: Heparin subq Code Status:   Code Status: Full Code Family Communication: None Disposition Plan: Discharge after 8 weeks of IV Vancomycin   Consultants:   Neurosurgery  Infectious disease  Procedures:   6/21: L5-S1 decompressive laminectomy with evacuation of epidural abscess  Antimicrobials:  Ceftriaxone (6/20>>6/21)  Vancomycin (6/20>>  Tenofovir (6/21>>  Subjective:  Awake alert in nad No cp no fever  Objective: Vitals:   06/12/19 1506 06/12/19 2036 06/13/19 0424 06/13/19 0453  BP: 132/66 139/69 (!) 136/118 (!) 128/57  Pulse: 93 96 92 85  Resp: 17 18 18    Temp: 98.2 F (36.8 C) 98.3 F (36.8 C) 97.9 F (36.6 C)   TempSrc: Oral Oral Oral   SpO2: 100% 100% 100%   Weight:      Height:        Intake/Output Summary (Last 24 hours) at 06/14/2019 1429 Last data filed at 06/14/2019 0757 Gross per 24 hour  Intake 1080.84 ml  Output 500 ml  Net 580.84 ml   Filed Weights   06/03/19 1452  Weight: 68 kg    Examination:  Awake coherent in nad s1 s2 no m/r/g cta b abd soft no rebound no guard Neuro intact Wound very clean  Data Reviewed: I have personally reviewed following labs and imaging studies  CBC: Recent Labs  Lab 06/08/19 0413 06/09/19 0656 06/10/19 0417 06/11/19 0337  WBC 4.8 5.8 4.7 5.4  NEUTROABS  --   --   --  3.4  HGB 10.3* 11.1* 10.4* 10.2*  HCT 32.1* 32.8* 32.7*  32.1*  MCV 88.4 84.8 89.1 89.7  PLT 82* 92* 86* 98*   Basic Metabolic Panel: Recent Labs  Lab 06/08/19 0413 06/09/19 0656 06/10/19 0417 06/12/19 0606 06/14/19 0423  NA 132* 132* 132* 133*  --   K 4.3 5.0 4.4 4.5  --   CL 100 98 97* 97*  --   CO2 27 24 28 28   --   GLUCOSE 138* 102* 148* 96  --   BUN 13 16 15 13   --   CREATININE 0.81 0.68 0.64 0.60* 0.81  CALCIUM 8.0* 8.2* 8.3* 8.3*  --   PHOS  --   --    --  5.3*  --    GFR: Estimated Creatinine Clearance: 104.9 mL/min (by C-G formula based on SCr of 0.81 mg/dL). Liver Function Tests: Recent Labs  Lab 06/12/19 0606  ALBUMIN 2.2*   No results for input(s): LIPASE, AMYLASE in the last 168 hours. No results for input(s): AMMONIA in the last 168 hours. Coagulation Profile: No results for input(s): INR, PROTIME in the last 168 hours. Cardiac Enzymes: No results for input(s): CKTOTAL, CKMB, CKMBINDEX, TROPONINI in the last 168 hours. BNP (last 3 results) No results for input(s): PROBNP in the last 8760 hours. HbA1C: No results for input(s): HGBA1C in the last 72 hours. CBG: No results for input(s): GLUCAP in the last 168 hours. Lipid Profile: No results for input(s): CHOL, HDL, LDLCALC, TRIG, CHOLHDL, LDLDIRECT in the last 72 hours. Thyroid Function Tests: No results for input(s): TSH, T4TOTAL, FREET4, T3FREE, THYROIDAB in the last 72 hours. Anemia Panel: No results for input(s): VITAMINB12, FOLATE, FERRITIN, TIBC, IRON, RETICCTPCT in the last 72 hours. Sepsis Labs: No results for input(s): PROCALCITON, LATICACIDVEN in the last 168 hours.  No results found for this or any previous visit (from the past 240 hour(s)).   Radiology Studies: No results found.  Scheduled Meds: . feeding supplement (ENSURE ENLIVE)  237 mL Oral TID BM  . methocarbamol  500 mg Oral TID  . multivitamin with minerals  1 tablet Oral Daily  . nicotine  21 mg Transdermal Daily  . pantoprazole  40 mg Oral Daily  . senna-docusate  1 tablet Oral BID  . sodium chloride flush  3 mL Intravenous Q12H  . Tenofovir Alafenamide Fumarate  1 tablet Oral Daily   Continuous Infusions: . vancomycin 1,000 mg (06/14/19 1021)     LOS: 10 days  Pleas KochJai Shamila Lerch, MD [facility] 2:29 PM    If 7PM-7AM, please contact night-coverage [email or web address]

## 2019-06-14 NOTE — Progress Notes (Signed)
Occupational Therapy Treatment and Discharge Note Patient Details Name: Dan [name] MRN: [id number] DOB: Apr 27, 1971 Today's Date: 06/14/2019    History of present illness 48 y.o. male with medical history significant of anxiety, chronic low back pain, liver cirrhosis, depression, hep B, hep C, polysubstance abuse presenting with a chief complaint of generalized body aches. MRI findings included EPIDURAL ABSCESS, LUMBAR. Pt is now s/p LUMBAR LAMINECTOMY FOR EPIDURAL ABSCESS, L5-S1.   OT comments  Pt is functioning modified independently to independently in ADL and mobility. No further OT needs, all goals are met.   Follow Up Recommendations  No OT follow up    Equipment Recommendations  None recommended by OT    Recommendations for Other Services      Precautions / Restrictions Precautions Precautions: Fall       Mobility Bed Mobility Overal bed mobility: Modified Independent                Transfers Overall transfer level: Independent Equipment used: None                  Balance Overall balance assessment: No apparent balance deficits (not formally assessed)                                         ADL either performed or assessed with clinical judgement   ADL Overall ADL's : Independent                         Toilet Transfer: Independent   Toileting- Water quality scientist and Hygiene: Independent       Functional mobility during ADLs: Independent       Vision       Perception     Praxis      Cognition Arousal/Alertness: Awake/alert Behavior During Therapy: WFL for tasks assessed/performed;Impulsive Overall Cognitive Status: Within Functional Limits for tasks assessed                                          Exercises     Shoulder Instructions       General Comments      Pertinent Vitals/ Pain       Pain Assessment: No/denies pain Pain Intervention(s): Premedicated before  session  Home Living                                          Prior Functioning/Environment              Frequency           Progress Toward Goals  OT Goals(current goals can now be found in the care plan section)  Progress towards OT goals: Goals met/education completed, patient discharged from OT  Acute Rehab OT Goals Patient Stated Goal: did not state  Plan All goals met and education completed, patient discharged from OT services    Co-evaluation                 AM-PAC OT "6 Clicks" Daily Activity     Outcome Measure   Help from another person eating meals?: None Help from another person taking care of personal grooming?: None Help from another  person toileting, which includes using toliet, bedpan, or urinal?: None Help from another person bathing (including washing, rinsing, drying)?: None Help from another person to put on and taking off regular upper body clothing?: None Help from another person to put on and taking off regular lower body clothing?: None 6 Click Score: 24    End of Session    OT Visit Diagnosis: Pain;Unsteadiness on feet (R26.81)   Activity Tolerance Patient tolerated treatment well   Patient Left in bed;with call bell/phone within reach   Nurse Communication          Time: 1211-1224 OT Time Calculation (min): 13 min  Charges: OT General Charges $OT Visit: 1 Visit OT Treatments $Self Care/Home Management : 8-22 mins  Nestor Lewandowsky, OTR/L Acute Rehabilitation Services Pager: [phone] Office: [phone]   Malka So 06/14/2019, 12:51 PM

## 2019-06-15 LAB — VANCOMYCIN, PEAK: Vancomycin Pk: 33 ug/mL (ref 30–40)

## 2019-06-15 LAB — VANCOMYCIN, TROUGH: Vancomycin Tr: 16 ug/mL (ref 15–20)

## 2019-06-15 MED ORDER — METHADONE HCL 10 MG PO TABS
10.0000 mg | ORAL_TABLET | Freq: Two times a day (BID) | ORAL | Status: DC
  Administered 2019-06-16 – 2019-06-18 (×5): 10 mg via ORAL
  Filled 2019-06-15 (×5): qty 1

## 2019-06-15 NOTE — Progress Notes (Signed)
Pharmacy Antibiotic Note  Dan [name] is a 48 y.o. male admitted on 06/03/2019 with epidural abscess s/p laminectomy 6/21.  Pharmacy has been consulted for Vancomycin dosing with plan for 8 week course of antibiotics per ID.    Peak and trough drawn today, AUC of 635.9 is above goal   Plan: Change Vancomycin 750 mg IV q12h with next dose.    Height: 5\' 11"  (180.3 cm) Weight: 150 lb (68 kg) IBW/kg (Calculated) : 75.3  Temp (24hrs), Avg:98.9 F (37.2 C), Min:98.9 F (37.2 C), Max:98.9 F (37.2 C)  Recent Labs  Lab 06/09/19 0656 06/10/19 0417 06/11/19 0337 06/12/19 0606 06/14/19 0423 06/15/19 1442 06/15/19 2218  WBC 5.8 4.7 5.4  --   --   --   --   CREATININE 0.68 0.64  --  0.60* 0.81  --   --   VANCOTROUGH  --   --   --   --   --   --  16  VANCOPEAK  --   --   --   --   --  33  --     Estimated Creatinine Clearance: 104.9 mL/min (by C-G formula based on SCr of 0.81 mg/dL).    No Known Allergies  Antibiotics this admission: 6/21 CTX >> 6/22 6/21 Vancomycin >> PTA Tenofovir >>  Microbiology results: 6/21 Abscess cx: abundant MRSA 6/20 BCx: NGF 6/20 COVID: neg   Phillis Knack, PharmD, BCPS  06/15/2019 11:12 PM

## 2019-06-15 NOTE — Progress Notes (Signed)
PROGRESS NOTE   48 y.o. male with a history of anxiety, chronic low back pain, liver cirrhosis, depression, hepatitis B/C, polysubstance abuse.  Admitted with epidural abscess with associated septic arthritis leading to severe cauda equina compression.   disc laminectomy 6/21 by neurosurgery and abscess is growing MRSA.  Continuing 2/2 ID on Vancomycin IV X 8 weeks ending 07/31/2019   Assessment & Plan:   Principal Problem:   Cauda equina compression (HCC) Active Problems:   Chronic hepatitis B (HCC)   Acute urinary retention   Epidural abscess   Septic arthritis (HCC)   Osteomyelitis (HCC)   MRSA infection (methicillin-resistant Staphylococcus aureus)   Lumbar epidural abscess with MRSA growing from culture Severe cauda equina compression status post laminectomy 6/21 Septic arthritis of right L5-S1 facet joint, right SI joint/sacrum -Continue vancomycin IV till 08/03/2019  -now hydromorphone 6 every 6hr -->changing to Methadone in am 10:00--I have extensively counselled him about this, and the need for this Will rpt periodic EKG  Hyponatremia, likely from tea-toast potomania Mild.  UNa 135 and Uosm 475--appropriate Labs am   Cirrhosis Hepatitis B/C Elevated alkaline phosphatase Thought secondary to bone pathology. No clinical symptoms of cholecystitis. Has a history of distended gallbladder from CT abdomen last year. Viral loads 06/06/2019 --27,200,000--if interested can contact [facility] once therapy complete re: eradication  E antigen is positive.  -Continue Tenofovir  History of polysubstance abuse Heroin use as an outpatient Transition am to methadone as above --nursing is aware and patient is aware that adjustments will only be made by daytime MD-care order placed to ensure that this is followed   Tobacco abuse -Continue nicotine patch  Anxiety Patient states he is prescribed Xanax 1 mg for this. Checked database and no prescription seen for Xanax -Atarax 25 mg TID  prn  social worker pending re: check in on him to gauge level of interest in terms of cessation in addition to giving him resources for polysubstance abuse cessation  DVT prophylaxis: Heparin subq Code Status:   Code Status: Full Code Family Communication: None Disposition Plan: Discharge after 8 weeks of IV Vancomycin   Consultants:   Neurosurgery  Infectious disease  Procedures:   6/21: L5-S1 decompressive laminectomy with evacuation of epidural abscess  Antimicrobials:  Ceftriaxone (6/20>>6/21)  Vancomycin (6/20>>  Tenofovir (6/21>>  Subjective:  Looks well no cp no fever  Walked the unit x 3 Ready for shower  Objective: Vitals:   06/13/19 0453 06/14/19 1547 06/14/19 2014 06/15/19 0546  BP: (!) 128/57 127/72 138/80 119/63  Pulse: 85 87 79 97  Resp:  16 17 18   Temp:  98.3 F (36.8 C) 99 F (37.2 C) 98.9 F (37.2 C)  TempSrc:  Oral Oral Oral  SpO2:  99%  98%  Weight:      Height:        Intake/Output Summary (Last 24 hours) at 06/15/2019 1333 Last data filed at 06/15/2019 0944 Gross per 24 hour  Intake 960 ml  Output -  Net 960 ml   Filed Weights   06/03/19 1452  Weight: 68 kg    Examination:  coherent s1 s2 no m/r/g cta b abd soft no rebound no guard Neuro intact Wound very clean-no swelling  Data Reviewed: I have personally reviewed following labs and imaging studies  CBC: Recent Labs  Lab 06/09/19 0656 06/10/19 0417 06/11/19 0337  WBC 5.8 4.7 5.4  NEUTROABS  --   --  3.4  HGB 11.1* 10.4* 10.2*  HCT 32.8* 32.7* 32.1*  MCV 84.8 89.1 89.7  PLT 92* 86* 98*   Basic Metabolic Panel: Recent Labs  Lab 06/09/19 0656 06/10/19 0417 06/12/19 0606 06/14/19 0423  NA 132* 132* 133*  --   K 5.0 4.4 4.5  --   CL 98 97* 97*  --   CO2 24 28 28   --   GLUCOSE 102* 148* 96  --   BUN 16 15 13   --   CREATININE 0.68 0.64 0.60* 0.81  CALCIUM 8.2* 8.3* 8.3*  --   PHOS  --   --  5.3*  --    GFR: Estimated Creatinine Clearance: 104.9 mL/min  (by C-G formula based on SCr of 0.81 mg/dL). Liver Function Tests: Recent Labs  Lab 06/12/19 0606  ALBUMIN 2.2*   No results for input(s): LIPASE, AMYLASE in the last 168 hours. No results for input(s): AMMONIA in the last 168 hours. Coagulation Profile: No results for input(s): INR, PROTIME in the last 168 hours. Cardiac Enzymes: No results for input(s): CKTOTAL, CKMB, CKMBINDEX, TROPONINI in the last 168 hours. BNP (last 3 results) No results for input(s): PROBNP in the last 8760 hours. HbA1C: No results for input(s): HGBA1C in the last 72 hours. CBG: No results for input(s): GLUCAP in the last 168 hours. Lipid Profile: No results for input(s): CHOL, HDL, LDLCALC, TRIG, CHOLHDL, LDLDIRECT in the last 72 hours. Thyroid Function Tests: No results for input(s): TSH, T4TOTAL, FREET4, T3FREE, THYROIDAB in the last 72 hours. Anemia Panel: No results for input(s): VITAMINB12, FOLATE, FERRITIN, TIBC, IRON, RETICCTPCT in the last 72 hours. Sepsis Labs: No results for input(s): PROCALCITON, LATICACIDVEN in the last 168 hours.  No results found for this or any previous visit (from the past 240 hour(s)).   Radiology Studies: No results found.  Scheduled Meds: . feeding supplement (ENSURE ENLIVE)  237 mL Oral TID BM  . [START ON 06/16/2019] methadone  10 mg Oral Q12H  . methocarbamol  500 mg Oral TID  . multivitamin with minerals  1 tablet Oral Daily  . nicotine  21 mg Transdermal Daily  . pantoprazole  40 mg Oral Daily  . senna-docusate  1 tablet Oral BID  . sodium chloride flush  3 mL Intravenous Q12H  . Tenofovir Alafenamide Fumarate  1 tablet Oral Daily   Continuous Infusions: . vancomycin 1,000 mg (06/15/19 1104)     LOS: 11 days  Pleas KochJai Micaylah Bertucci, MD [facility] 1:33 PM    If 7PM-7AM, please contact night-coverage [email or web address]

## 2019-06-15 NOTE — Plan of Care (Signed)
  Problem: Education: Goal: Knowledge of General Education information will improve Description: Including pain rating scale, medication(s)/side effects and non-pharmacologic comfort measures Outcome: Progressing   Problem: Clinical Measurements: Goal: Ability to maintain clinical measurements within normal limits will improve Outcome: Progressing   Problem: Activity: Goal: Risk for activity intolerance will decrease Outcome: Progressing   Problem: Pain Managment: Goal: General experience of comfort will improve Outcome: Progressing   Problem: Skin Integrity: Goal: Risk for impaired skin integrity will decrease Outcome: Progressing   Problem: Nutrition: Goal: Adequate nutrition will be maintained Outcome: Progressing

## 2019-06-16 LAB — CBC WITH DIFFERENTIAL/PLATELET
Abs Immature Granulocytes: 0.06 10*3/uL (ref 0.00–0.07)
Basophils Absolute: 0 10*3/uL (ref 0.0–0.1)
Basophils Relative: 1 %
Eosinophils Absolute: 0 10*3/uL (ref 0.0–0.5)
Eosinophils Relative: 1 %
HCT: 33.8 % — ABNORMAL LOW (ref 39.0–52.0)
Hemoglobin: 10.7 g/dL — ABNORMAL LOW (ref 13.0–17.0)
Immature Granulocytes: 1 %
Lymphocytes Relative: 16 %
Lymphs Abs: 1.2 10*3/uL (ref 0.7–4.0)
MCH: 28.5 pg (ref 26.0–34.0)
MCHC: 31.7 g/dL (ref 30.0–36.0)
MCV: 90.1 fL (ref 80.0–100.0)
Monocytes Absolute: 0.6 10*3/uL (ref 0.1–1.0)
Monocytes Relative: 8 %
Neutro Abs: 5.6 10*3/uL (ref 1.7–7.7)
Neutrophils Relative %: 73 %
Platelets: 105 10*3/uL — ABNORMAL LOW (ref 150–400)
RBC: 3.75 MIL/uL — ABNORMAL LOW (ref 4.22–5.81)
RDW: 14 % (ref 11.5–15.5)
WBC: 7.6 10*3/uL (ref 4.0–10.5)
nRBC: 0 % (ref 0.0–0.2)

## 2019-06-16 LAB — COMPREHENSIVE METABOLIC PANEL
ALT: 31 U/L (ref 0–44)
AST: 34 U/L (ref 15–41)
Albumin: 2.6 g/dL — ABNORMAL LOW (ref 3.5–5.0)
Alkaline Phosphatase: 146 U/L — ABNORMAL HIGH (ref 38–126)
Anion gap: 7 (ref 5–15)
BUN: 29 mg/dL — ABNORMAL HIGH (ref 6–20)
CO2: 29 mmol/L (ref 22–32)
Calcium: 8.6 mg/dL — ABNORMAL LOW (ref 8.9–10.3)
Chloride: 98 mmol/L (ref 98–111)
Creatinine, Ser: 0.93 mg/dL (ref 0.61–1.24)
GFR calc Af Amer: >60 mL/min (ref >60)
GFR calc non Af Amer: >60 mL/min (ref >60)
Glucose, Bld: 145 mg/dL — ABNORMAL HIGH (ref 70–99)
Potassium: 5.1 mmol/L (ref 3.5–5.1)
Sodium: 134 mmol/L — ABNORMAL LOW (ref 135–145)
Total Bilirubin: 0.9 mg/dL (ref 0.3–1.2)
Total Protein: 7.5 g/dL (ref 6.5–8.1)

## 2019-06-16 MED ORDER — VANCOMYCIN HCL IN DEXTROSE 750-5 MG/150ML-% IV SOLN
750.0000 mg | Freq: Two times a day (BID) | INTRAVENOUS | Status: DC
  Administered 2019-06-16 – 2019-06-18 (×5): 750 mg via INTRAVENOUS
  Filled 2019-06-16 (×6): qty 150

## 2019-06-16 MED ORDER — HYDROXYZINE HCL 25 MG PO TABS
25.0000 mg | ORAL_TABLET | Freq: Two times a day (BID) | ORAL | Status: DC
  Administered 2019-06-16 – 2019-06-18 (×4): 25 mg via ORAL
  Filled 2019-06-16 (×4): qty 1

## 2019-06-16 NOTE — Progress Notes (Signed)
PROGRESS NOTE   48 y.o. male with a history of anxiety, chronic low back pain, liver cirrhosis, depression, hepatitis B/C, polysubstance abuse.  Admitted with epidural abscess with associated septic arthritis leading to severe cauda equina compression.   disc laminectomy 6/21 by neurosurgery and abscess is growing MRSA.  Continuing 2/2 ID on Vancomycin IV X 8 weeks ending 07/31/2019   Assessment & Plan:   Principal Problem:   Cauda equina compression (HCC) Active Problems:   Chronic hepatitis B (HCC)   Acute urinary retention   Epidural abscess   Septic arthritis (HCC)   Osteomyelitis (HCC)   MRSA infection (methicillin-resistant Staphylococcus aureus)   Lumbar epidural abscess with MRSA growing from culture Severe cauda equina compression status post laminectomy 6/21 Septic arthritis of right L5-S1 facet joint, right SI joint/sacrum -Continue vancomycin IV till 08/03/2019  -now hydromorphone 6 every 6hr -->changing to Methadone in am 10:00--I have extensively counselled him about this, and the need for this Will rpt periodic EKG  Hyponatremia, likely from tea-toast potomania--drinking lots of soda Borderline hyperkalemia--asked him to cut back bananas Mild.  UNa 135 and Uosm 475--appropriate Labs am   Cirrhosis Hepatitis B/C Elevated alkaline phosphatase Thought secondary to bone pathology. No clinical symptoms of cholecystitis. Has a history of distended gallbladder from CT abdomen last year. Viral loads 06/06/2019 --27,200,000--if interested can contact [facility] once therapy complete re: eradication -E antigen is positive.  -Continue Tenofovir and follow periodic ALT  History of polysubstance abuse Heroin use as an outpatient Transition am to methadone as above --nursing is aware and patient is aware that adjustments will only be made by daytime MD-care order placed to ensure that this is followed   Tobacco abuse -Continue nicotine patch  Anxiety Patient states he is  prescribed Xanax 1 mg for this. Checked database and no prescription seen for Xanax -Atarax 25 mg TID prn  social worker pending re: check in on him to gauge level of interest in terms of cessation in addition to giving him resources for polysubstance abuse cessation  DVT prophylaxis: Heparin subq Code Status:   Code Status: Full Code Family Communication: None Disposition Plan: Discharge after 8 weeks of IV Vancomycin   Consultants:   Neurosurgery  Infectious disease  Procedures:   6/21: L5-S1 decompressive laminectomy with evacuation of epidural abscess  Antimicrobials:  Ceftriaxone (6/20>>6/21)  Vancomycin (6/20>>  Tenofovir (6/21>>  Subjective:  Awake pleasant in nad showered has been on the unit walking drinking coca cola  Objective: Vitals:   06/14/19 1547 06/14/19 2014 06/15/19 0546 06/16/19 0455  BP: 127/72 138/80 119/63 134/67  Pulse: 87 79 97 99  Resp: 16 17 18 16   Temp: 98.3 F (36.8 C) 99 F (37.2 C) 98.9 F (37.2 C) 97.8 F (36.6 C)  TempSrc: Oral Oral Oral Oral  SpO2: 99%  98% 100%  Weight:      Height:        Intake/Output Summary (Last 24 hours) at 06/16/2019 1112 Last data filed at 06/16/2019 0831 Gross per 24 hour  Intake 1123 ml  Output 600 ml  Net 523 ml   Filed Weights   06/03/19 1452  Weight: 68 kg    Examination:  coherent no distress no objective signs of pain s1 s2 no m/r/g cta b, mild bilateral gynecomastia abd soft no rebound no guard Neuro intact Wound very clean-no swelling  Data Reviewed: I have personally reviewed following labs and imaging studies  CBC: Recent Labs  Lab 06/10/19 0417 06/11/19 0337 06/16/19 0351  WBC 4.7 5.4 7.6  NEUTROABS  --  3.4 5.6  HGB 10.4* 10.2* 10.7*  HCT 32.7* 32.1* 33.8*  MCV 89.1 89.7 90.1  PLT 86* 98* 105*   Basic Metabolic Panel: Recent Labs  Lab 06/10/19 0417 06/12/19 0606 06/14/19 0423 06/16/19 0351  NA 132* 133*  --  134*  K 4.4 4.5  --  5.1  CL 97* 97*  --  98   CO2 28 28  --  29  GLUCOSE 148* 96  --  145*  BUN 15 13  --  29*  CREATININE 0.64 0.60* 0.81 0.93  CALCIUM 8.3* 8.3*  --  8.6*  PHOS  --  5.3*  --   --    GFR: Estimated Creatinine Clearance: 91.4 mL/min (by C-G formula based on SCr of 0.93 mg/dL). Liver Function Tests: Recent Labs  Lab 06/12/19 0606 06/16/19 0351  AST  --  34  ALT  --  31  ALKPHOS  --  146*  BILITOT  --  0.9  PROT  --  7.5  ALBUMIN 2.2* 2.6*   No results for input(s): LIPASE, AMYLASE in the last 168 hours. No results for input(s): AMMONIA in the last 168 hours. Coagulation Profile: No results for input(s): INR, PROTIME in the last 168 hours. Cardiac Enzymes: No results for input(s): CKTOTAL, CKMB, CKMBINDEX, TROPONINI in the last 168 hours. BNP (last 3 results) No results for input(s): PROBNP in the last 8760 hours. HbA1C: No results for input(s): HGBA1C in the last 72 hours. CBG: No results for input(s): GLUCAP in the last 168 hours. Lipid Profile: No results for input(s): CHOL, HDL, LDLCALC, TRIG, CHOLHDL, LDLDIRECT in the last 72 hours. Thyroid Function Tests: No results for input(s): TSH, T4TOTAL, FREET4, T3FREE, THYROIDAB in the last 72 hours. Anemia Panel: No results for input(s): VITAMINB12, FOLATE, FERRITIN, TIBC, IRON, RETICCTPCT in the last 72 hours. Sepsis Labs: No results for input(s): PROCALCITON, LATICACIDVEN in the last 168 hours.  No results found for this or any previous visit (from the past 240 hour(s)).   Radiology Studies: No results found.  Scheduled Meds: . feeding supplement (ENSURE ENLIVE)  237 mL Oral TID BM  . methadone  10 mg Oral Q12H  . methocarbamol  500 mg Oral TID  . multivitamin with minerals  1 tablet Oral Daily  . nicotine  21 mg Transdermal Daily  . pantoprazole  40 mg Oral Daily  . senna-docusate  1 tablet Oral BID  . sodium chloride flush  3 mL Intravenous Q12H  . Tenofovir Alafenamide Fumarate  1 tablet Oral Daily   Continuous Infusions: . vancomycin  750 mg (06/16/19 1005)     LOS: 12 days  Pleas KochJai Jacquetta Polhamus, MD [facility] 11:12 AM    If 7PM-7AM, please contact night-coverage [email or web address]

## 2019-06-16 NOTE — Plan of Care (Signed)
  Problem: Clinical Measurements: Goal: Ability to maintain clinical measurements within normal limits will improve Outcome: Progressing   Problem: Activity: Goal: Risk for activity intolerance will decrease Outcome: Progressing   Problem: Nutrition: Goal: Adequate nutrition will be maintained Outcome: Progressing   Problem: Elimination: Goal: Will not experience complications related to bowel motility Outcome: Adequate for Discharge

## 2019-06-17 LAB — COMPREHENSIVE METABOLIC PANEL
ALT: 26 U/L (ref 0–44)
AST: 29 U/L (ref 15–41)
Albumin: 2.2 g/dL — ABNORMAL LOW (ref 3.5–5.0)
Alkaline Phosphatase: 119 U/L (ref 38–126)
Anion gap: 7 (ref 5–15)
BUN: 23 mg/dL — ABNORMAL HIGH (ref 6–20)
CO2: 29 mmol/L (ref 22–32)
Calcium: 8.4 mg/dL — ABNORMAL LOW (ref 8.9–10.3)
Chloride: 98 mmol/L (ref 98–111)
Creatinine, Ser: 0.88 mg/dL (ref 0.61–1.24)
GFR calc Af Amer: >60 mL/min (ref >60)
GFR calc non Af Amer: >60 mL/min (ref >60)
Glucose, Bld: 130 mg/dL — ABNORMAL HIGH (ref 70–99)
Potassium: 4.6 mmol/L (ref 3.5–5.1)
Sodium: 134 mmol/L — ABNORMAL LOW (ref 135–145)
Total Bilirubin: 0.5 mg/dL (ref 0.3–1.2)
Total Protein: 6.4 g/dL — ABNORMAL LOW (ref 6.5–8.1)

## 2019-06-17 MED ORDER — TRAZODONE HCL 100 MG PO TABS
100.0000 mg | ORAL_TABLET | Freq: Every day | ORAL | Status: DC
  Administered 2019-06-17: 100 mg via ORAL
  Filled 2019-06-17: qty 1

## 2019-06-17 NOTE — Progress Notes (Signed)
PROGRESS NOTE   48 y.o. male with a history of anxiety, chronic low back pain, liver cirrhosis, depression, hepatitis B/C, polysubstance abuse.  Admitted with epidural abscess with associated septic arthritis leading to severe cauda equina compression.   disc laminectomy 6/21 by neurosurgery and abscess is growing MRSA.  Continuing 2/2 ID on Vancomycin IV X 8 weeks ending 07/31/2019   Assessment & Plan:   Principal Problem:   Cauda equina compression (HCC) Active Problems:   Chronic hepatitis B (HCC)   Acute urinary retention   Epidural abscess   Septic arthritis (HCC)   Osteomyelitis (HCC)   MRSA infection (methicillin-resistant Staphylococcus aureus)   Lumbar epidural abscess with MRSA growing from culture Severe cauda equina compression status post laminectomy 6/21 Septic arthritis of right L5-S1 facet joint, right SI joint/sacrum -Continue vancomycin IV till 08/03/2019  QtC on ekg 420 range  Hyponatremia, likely from tea-toast potomania--drinking lots of soda Borderline hyperkalemia--has resolved Mild.  UNa 135 and Uosm 475--appropriate Labs show a little bit of azotemia--force oral non caloric fluids  Cirrhosis Hepatitis B/C Elevated alkaline phosphatase Thought secondary to bone pathology. No clinical symptoms of cholecystitis. Has a history of distended gallbladder from CT abdomen last year. Viral loads 06/06/2019 --27,200,000--if interested can contact [facility] once therapy complete re: eradication -E antigen is positive.  -Continue Tenofovir and follow periodic ALT  History of polysubstance abuse Heroin use as an outpatient -hydromorphone 6 every 6hr -->changed to Methadone 7/3--some insomnia-I have explained that this is likely 2/2 physical cravings and I we will dose some trazadone tonight for assistance sleeping --nursing is aware and patient is aware that adjustments will only be made by daytime MD-care order placed to ensure that this is followed   Tobacco abuse  -Continue nicotine patch  Anxiety Patient states he is prescribed Xanax 1 mg for this. Checked database and no prescription seen for Xanax -Atarax 25 mg TID prn  social worker pending re:  check in on him to gauge level of interest in terms of cessation   DVT prophylaxis: Heparin subq Code Status:   Code Status: Full Code Family Communication: None Disposition Plan: Discharge after 8 weeks of IV Vancomycin   Consultants:   Neurosurgery  Infectious disease  Procedures:   6/21: L5-S1 decompressive laminectomy with evacuation of epidural abscess  Antimicrobials:  Ceftriaxone (6/20>>6/21)  Vancomycin (6/20>>  Tenofovir (6/21>>  Subjective:  didn't sleep well last pm--woke up in a cold sweat Eating good  ambulatory  Objective: Vitals:   06/14/19 2014 06/15/19 0546 06/16/19 0455 06/16/19 2053  BP: 138/80 119/63 134/67 129/67  Pulse: 79 97 99 (!) 101  Resp: 17 18 16 18   Temp: 99 F (37.2 C) 98.9 F (37.2 C) 97.8 F (36.6 C) (!) 97.3 F (36.3 C)  TempSrc: Oral Oral Oral Oral  SpO2:  98% 100% 98%  Weight:      Height:        Intake/Output Summary (Last 24 hours) at 06/17/2019 0917 Last data filed at 06/17/2019 0509 Gross per 24 hour  Intake 1140 ml  Output -  Net 1140 ml   Filed Weights   06/03/19 1452  Weight: 68 kg    Examination:  coherent no distress, slightly tired  s1 s2 no m/r/g bilateral gynecomastia cta b abd soft no rebound no guard Neuro intact Wound very clean-no swelling  Data Reviewed: I have personally reviewed following labs and imaging studies  CBC: Recent Labs  Lab 06/11/19 0337 06/16/19 0351  WBC 5.4 7.6  NEUTROABS  3.4 5.6  HGB 10.2* 10.7*  HCT 32.1* 33.8*  MCV 89.7 90.1  PLT 98* 027*   Basic Metabolic Panel: Recent Labs  Lab 06/12/19 0606 06/14/19 0423 06/16/19 0351 06/17/19 0349  NA 133*  --  134* 134*  K 4.5  --  5.1 4.6  CL 97*  --  98 98  CO2 28  --  29 29  GLUCOSE 96  --  145* 130*  BUN 13  --  29* 23*   CREATININE 0.60* 0.81 0.93 0.88  CALCIUM 8.3*  --  8.6* 8.4*  PHOS 5.3*  --   --   --    GFR: Estimated Creatinine Clearance: 96.6 mL/min (by C-G formula based on SCr of 0.88 mg/dL). Liver Function Tests: Recent Labs  Lab 06/12/19 0606 06/16/19 0351 06/17/19 0349  AST  --  34 29  ALT  --  31 26  ALKPHOS  --  146* 119  BILITOT  --  0.9 0.5  PROT  --  7.5 6.4*  ALBUMIN 2.2* 2.6* 2.2*   No results for input(s): LIPASE, AMYLASE in the last 168 hours. No results for input(s): AMMONIA in the last 168 hours. Coagulation Profile: No results for input(s): INR, PROTIME in the last 168 hours. Cardiac Enzymes: No results for input(s): CKTOTAL, CKMB, CKMBINDEX, TROPONINI in the last 168 hours. BNP (last 3 results) No results for input(s): PROBNP in the last 8760 hours. HbA1C: No results for input(s): HGBA1C in the last 72 hours. CBG: No results for input(s): GLUCAP in the last 168 hours. Lipid Profile: No results for input(s): CHOL, HDL, LDLCALC, TRIG, CHOLHDL, LDLDIRECT in the last 72 hours. Thyroid Function Tests: No results for input(s): TSH, T4TOTAL, FREET4, T3FREE, THYROIDAB in the last 72 hours. Anemia Panel: No results for input(s): VITAMINB12, FOLATE, FERRITIN, TIBC, IRON, RETICCTPCT in the last 72 hours. Sepsis Labs: No results for input(s): PROCALCITON, LATICACIDVEN in the last 168 hours.  No results found for this or any previous visit (from the past 240 hour(s)).   Radiology Studies: No results found.  Scheduled Meds: . feeding supplement (ENSURE ENLIVE)  237 mL Oral TID BM  . hydrOXYzine  25 mg Oral BID  . methadone  10 mg Oral Q12H  . methocarbamol  500 mg Oral TID  . multivitamin with minerals  1 tablet Oral Daily  . nicotine  21 mg Transdermal Daily  . pantoprazole  40 mg Oral Daily  . senna-docusate  1 tablet Oral BID  . sodium chloride flush  3 mL Intravenous Q12H  . Tenofovir Alafenamide Fumarate  1 tablet Oral Daily   Continuous Infusions: .  vancomycin 750 mg (06/17/19 0914)     LOS: 13 days  Verneita Griffes, MD [facility] 9:17 AM    If 7PM-7AM, please contact night-coverage [email or web address]

## 2019-06-17 NOTE — Plan of Care (Signed)
  Problem: Education: Goal: Knowledge of General Education information will improve Description: Including pain rating scale, medication(s)/side effects and non-pharmacologic comfort measures Outcome: Progressing   Problem: Clinical Measurements: Goal: Will remain free from infection Outcome: Progressing   Problem: Nutrition: Goal: Adequate nutrition will be maintained Outcome: Progressing   Problem: Coping: Goal: Level of anxiety will decrease Outcome: Progressing   Problem: Skin Integrity: Goal: Risk for impaired skin integrity will decrease Outcome: Progressing   

## 2019-06-18 LAB — COMPREHENSIVE METABOLIC PANEL
ALT: 26 U/L (ref 0–44)
AST: 28 U/L (ref 15–41)
Albumin: 2.1 g/dL — ABNORMAL LOW (ref 3.5–5.0)
Alkaline Phosphatase: 122 U/L (ref 38–126)
Anion gap: 7 (ref 5–15)
BUN: 20 mg/dL (ref 6–20)
CO2: 27 mmol/L (ref 22–32)
Calcium: 8.4 mg/dL — ABNORMAL LOW (ref 8.9–10.3)
Chloride: 102 mmol/L (ref 98–111)
Creatinine, Ser: 0.75 mg/dL (ref 0.61–1.24)
GFR calc Af Amer: >60 mL/min (ref >60)
GFR calc non Af Amer: >60 mL/min (ref >60)
Glucose, Bld: 110 mg/dL — ABNORMAL HIGH (ref 70–99)
Potassium: 4.4 mmol/L (ref 3.5–5.1)
Sodium: 136 mmol/L (ref 135–145)
Total Bilirubin: 0.4 mg/dL (ref 0.3–1.2)
Total Protein: 6.3 g/dL — ABNORMAL LOW (ref 6.5–8.1)

## 2019-06-18 NOTE — Discharge Summary (Signed)
Physician Discharge Summary  Nuala Alphaony W Balbuena WUJ:[id number] DOB: 06/09/1971 DOA: 06/03/2019  PCP: Dema SeverinYork, [name], NP  Admit date: 06/03/2019 Discharge date: 06/18/2019  Time spent: 25 minutes  Recommendations for Outpatient Follow-up:  1. No recommendations as patient left AGAINST MEDICAL ADVICE--- he left without prejudice and has been encouraged to come back [facility] for further care once he can sort out his social issues  Discharge Diagnoses:  Principal Problem:   Cauda equina compression (HCC) Active Problems:   Chronic hepatitis B (HCC)   Acute urinary retention   Epidural abscess   Septic arthritis (HCC)   Osteomyelitis (HCC)   MRSA infection (methicillin-resistant Staphylococcus aureus)   Discharge Condition: Guarded  Diet recommendation: Heart healthy  Filed Weights   06/03/19 1452  Weight: 68 kg    History of present illness:  48 year old male history of chronic low back pain Cirrhosis Hep C and B Polysubstance abuse cocaine multiple other issues Came into the hospital 6/20 generalized body aches pains-work-up revealed osteomyelitis of sacrum and septic arthritis L5-S1 facet joints Neurosurgery was emergently consulted they did surgery because of probable cauda equina syndrome and he was seen by ID as well   Hospital Course:  Lumbar epidural abscess with MRSA growing from culture Severe cauda equina compression status post laminectomy 6/21 Septic arthritis of right L5-S1 facet joint, right SI joint/sacrum --Supposed to continue  vancomycin IV till 08/03/2019 --- he decided to leave AGAINST MEDICAL ADVICE to take care of his mother We pulled his central access prior to discharge and I informed him I cannot treat him appropriately for his infection without him coming back to the hospital to get placed--- he understands the risks of leaving the hospital including death etc etc  Hyponatremia, likely from tea-toast potomania--drinking lots of  soda Borderline hyperkalemia--has resolved Mild.  UNa 135 and Uosm 475--appropriate He is encouraged to drink more fluids  Cirrhosis Hepatitis B/C Elevated alkaline phosphatase Thought secondary to bone pathology. No clinical symptoms of cholecystitis. Has a history of distended gallbladder from CT abdomen last year. Viral loads 06/06/2019 --27,200,000--if interested can contact [facility] once therapy complete re: eradication -E antigen is positive.  -Continue Tenofovir as an outpatient if he can get follow-up we will happily prescribe this for him when he comes back  History of polysubstance abuse Heroin use as an outpatient -hydromorphone 6 every 6hr -->changed to Methadone 7/3--some insomnia-I have explained that this is likely 2/2 physical cravings and I we will dose some trazadone It is hoped that he will stop using heroin as an outpatient I have not discharged him with any medications  Tobacco abuse -Continue nicotine patch  Anxiety Patient states he is prescribed Xanax 1 mg for this. Checked database and no prescription seen for Xanax -Atarax 25 mg TID prn  social worker pending re:  check in on him to gauge level of interest in terms of cessation   Procedures: Back surgery Consultations:  ID  Neurosurgeon  Discharge Exam: Vitals:   06/17/19 2040 06/18/19 0512  BP: (!) 112/56 111/60  Pulse: 100 94  Resp: 18 18  Temp: 98 F (36.7 C) 98.1 F (36.7 C)  SpO2: 97% 96%    General: Awake alert coherent anxious tells me that his mother is going to be hospitalized and has to leave right away Chest clear Cardiovascular: S1-S2 no murmur slightly tachycardic Respiratory: Chest clear No lower extremity edema  Discharge Instructions   Discharge Instructions    Diet - low sodium  heart healthy   Complete by: As directed    Increase activity slowly   Complete by: As directed      Allergies as of 06/18/2019   No Known Allergies     Medication List    STOP taking  these medications   feeding supplement (ENSURE ENLIVE) Liqd   multivitamin with minerals Tabs tablet   oxyCODONE 30 MG 12 hr tablet   Oxycodone HCl 10 MG Tabs   pantoprazole 40 MG tablet Commonly known as: Protonix   polyethylene glycol 17 g packet Commonly known as: MIRALAX / GLYCOLAX   senna-docusate 8.6-50 MG tablet Commonly known as: Senokot-S     TAKE these medications   Vemlidy 25 MG Tabs Generic drug: Tenofovir Alafenamide Fumarate Take 1 tablet (25 mg total) by mouth daily.      No Known Allergies    The results of significant diagnostics from this hospitalization (including imaging, microbiology, ancillary and laboratory) are listed below for reference.    Significant Diagnostic Studies: Mr Lumbar Spine W Wo Contrast  Result Date: 06/03/2019 CLINICAL DATA:  Back pain, cauda equina syndrome suspected. EXAM: MRI LUMBAR SPINE WITHOUT AND WITH CONTRAST TECHNIQUE: Multiplanar and multiecho pulse sequences of the lumbar spine were obtained without and with intravenous contrast. CONTRAST:  Gadavist 6 mL. COMPARISON:  09/30/2018 FINDINGS: Segmentation:  Standard. Alignment: Straightening, and reversal, of the normal lumbar lordosis. Vertebrae: T12-L1 diskitis with regional osteomyelitis as well as L3-L4 diskitis with regional osteomyelitis has healed. However, there is abnormal bone marrow edema at L5 and S1 on the RIGHT extending into the RIGHT sacral ala and SI joint. Conus medullaris and cauda equina: Conus extends to the L1 level level. Conus appears normal. There is relative stenosis at L3-4 due to chronic L3 retropulsion. Below this, there are space-occupying fluid collections in the epidural space, RIGHT greater than LEFT extending into the sacral canal consistent with epidural abscesses. The show peripheral enhancement. See series 11, image 30 where a RIGHT-sided collection measures 8 x 17 mm cross-section. A LEFT-sided collection at this level is slightly smaller.  Paraspinal and other soft tissues: Paravertebral abscesses are seen dorsal to the RIGHT L5-S1 facet, roughly 1 cm in size, and are indicative of septic facet arthritis. Disc levels: L1-L2: Central and leftward protrusion. Mild stenosis but no impingement. L2-L3:  Unremarkable. L3-L4: Healed discitis and osteomyelitis. L3 retropulsion. Moderate stenosis. BILATERAL subarticular zone narrowing. L4-L5: Disc space narrowing. Central protrusion. Posterior element hypertrophy. Moderate stenosis. BILATERAL L5 neural impingement likely. L5-S1: Advanced disc space narrowing. Central protrusion. Trefoil appearance to the thecal sac due to BILATERAL epidural abscesses, severe compression. Significant subarticular zone and foraminal zone narrowing affecting the L5 and S1 nerve roots. Abnormal edema of L5 on the RIGHT and S1 on the RIGHT reflecting septic facet joint and septic sacroiliac joint. Enhancing abscesses extend into the sacral canal, greater on the RIGHT. IMPRESSION: T12-L1 and L3-4 discitis with regional osteomyelitis has essentially resolved. Moderate stenosis at L3-4 related to retropulsion of L3. The patient has developed septic arthritis of the RIGHT L5-S1 facet joint, and septic arthritis of the RIGHT sacroiliac joint. Osteomyelitis of the RIGHT sacrum and RIGHT L5 pedicle are noted. Epidural abscesses in the lower lumbar and upper sacral canal, resulting in severe compression of the thecal sac and cauda equina nerve roots. Depending on the patient's neurologic status, neurosurgical consultation may be warranted. These results were called by telephone at the time of interpretation on 06/03/2019 at 9:36 pm to Dr. Gareth Morgan , who verbally  acknowledged these results. Electronically Signed   By: Elsie StainJohn T Curnes M.D.   On: 06/03/2019 21:41   Dg Chest Portable 1 View  Result Date: 06/03/2019 CLINICAL DATA:  Chest pain and cough EXAM: PORTABLE CHEST 1 VIEW COMPARISON:  09/29/2018 FINDINGS: Bulla and scarring in  the right lung apex. No acute consolidation or effusion. Normal cardiomediastinal silhouette. No pneumothorax. IMPRESSION: No active disease.  Stable scarring in the right lung apex. Electronically Signed   By: Jasmine PangKim  Fujinaga M.D.   On: 06/03/2019 16:30   [place] Ekg Site Rite  Result Date: 06/07/2019 If Site Rite image not attached, placement could not be confirmed due to current cardiac rhythm.   Microbiology: No results found for this or any previous visit (from the past 240 hour(s)).   Labs: Basic Metabolic Panel: Recent Labs  Lab 06/12/19 0606 06/14/19 0423 06/16/19 0351 06/17/19 0349 06/18/19 0403  NA 133*  --  134* 134* 136  K 4.5  --  5.1 4.6 4.4  CL 97*  --  98 98 102  CO2 28  --  29 29 27   GLUCOSE 96  --  145* 130* 110*  BUN 13  --  29* 23* 20  CREATININE 0.60* 0.81 0.93 0.88 0.75  CALCIUM 8.3*  --  8.6* 8.4* 8.4*  PHOS 5.3*  --   --   --   --    Liver Function Tests: Recent Labs  Lab 06/12/19 0606 06/16/19 0351 06/17/19 0349 06/18/19 0403  AST  --  34 29 28  ALT  --  31 26 26   ALKPHOS  --  146* 119 122  BILITOT  --  0.9 0.5 0.4  PROT  --  7.5 6.4* 6.3*  ALBUMIN 2.2* 2.6* 2.2* 2.1*   No results for input(s): LIPASE, AMYLASE in the last 168 hours. No results for input(s): AMMONIA in the last 168 hours. CBC: Recent Labs  Lab 06/16/19 0351  WBC 7.6  NEUTROABS 5.6  HGB 10.7*  HCT 33.8*  MCV 90.1  PLT 105*   Cardiac Enzymes: No results for input(s): CKTOTAL, CKMB, CKMBINDEX, TROPONINI in the last 168 hours. BNP: BNP (last 3 results) No results for input(s): BNP in the last 8760 hours.  ProBNP (last 3 results) No results for input(s): PROBNP in the last 8760 hours.  CBG: No results for input(s): GLUCAP in the last 168 hours.     Signed:  Rhetta MuraJai-Gurmukh Argentina Kosch MD   [facility] 06/18/2019, 12:05 PM

## 2019-06-18 NOTE — Progress Notes (Signed)
Pt called reporting nurse to room and communicated his need to immediately leave as his mom was not doing well. Did not have a clearer explanation except the aforementioned. Explained self discharge to pt and notified MD who will come and address the pt in person.

## 2019-06-18 NOTE — Progress Notes (Signed)
Pt signed [facility] paperwork and left. picc line removed by iv team before leaving

## 2019-06-18 NOTE — Plan of Care (Signed)
°  Problem: Education: °Goal: Knowledge of General Education information will improve °Description: Including pain rating scale, medication(s)/side effects and non-pharmacologic comfort measures °Outcome: Progressing °  °Problem: Elimination: °Goal: Will not experience complications related to bowel motility °Outcome: Progressing °Goal: Will not experience complications related to urinary retention °Outcome: Progressing °  °Problem: Pain Managment: °Goal: General experience of comfort will improve °Outcome: Progressing °  °

## 2019-12-13 IMAGING — CT CT HEAD W/O CM
3 series · 16 of 47 positions shown, 19 images · non-contrast
Comparison: CT brain 05/30/2013

CLINICAL DATA: Subacute left vision loss, unable to see out of left
eye for 1 month

EXAM:
CT HEAD WITHOUT CONTRAST
TECHNIQUE: Contiguous axial images were obtained from the base of the skull
through the vertex without intravenous contrast.

[Series 2: head wo · axial · 0.47mm/px · z∈[+159,+289]mm · 10 of 32 slices shown, 13 images]
[im 3/32  brain]
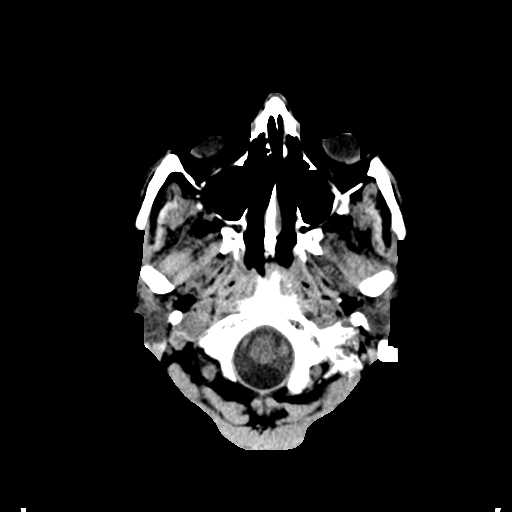
[im 3/32  bone]
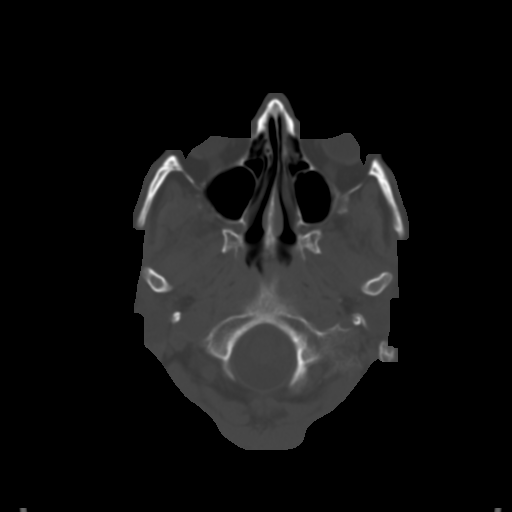
[im 6/32  brain]
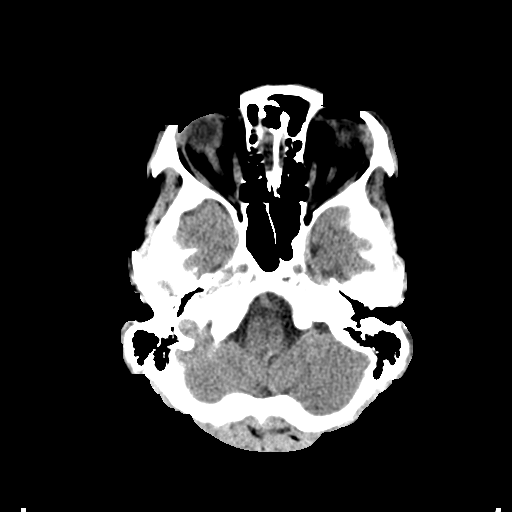
[im 9/32  brain]
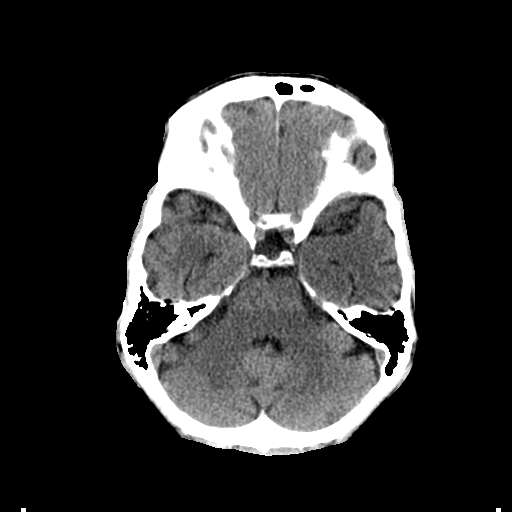
[im 11/32  brain]
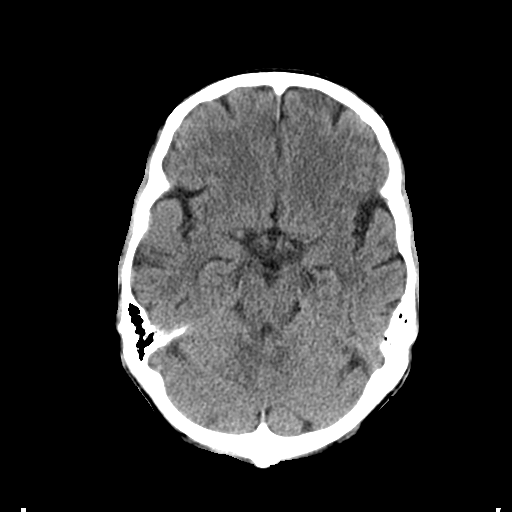
[im 14/32  brain]
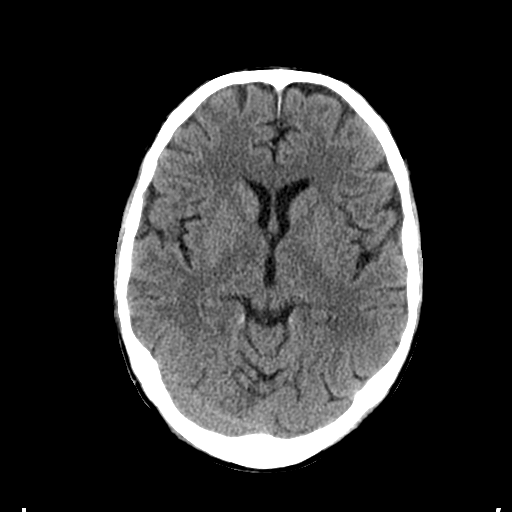
[im 14/32  bone]
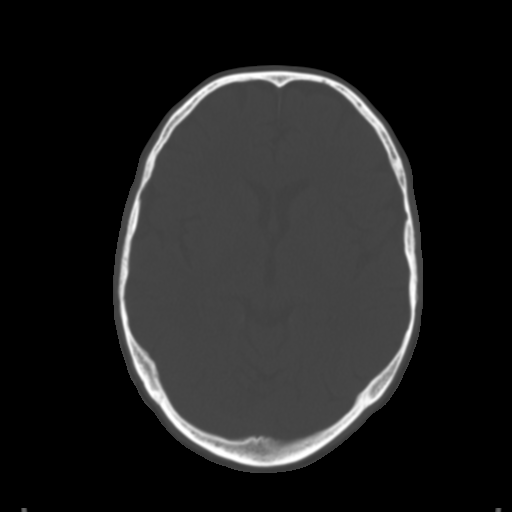
[im 18/32  brain]
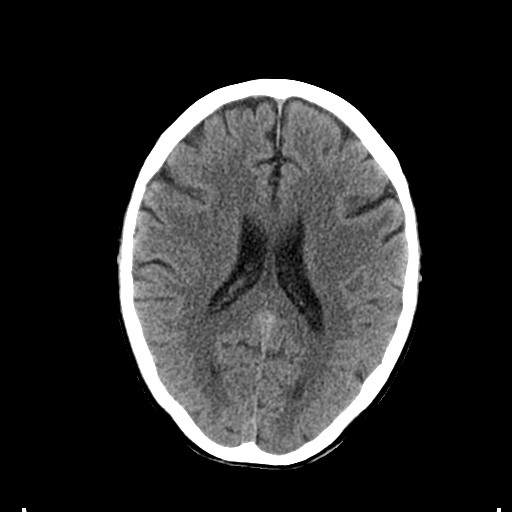
[im 21/32  brain]
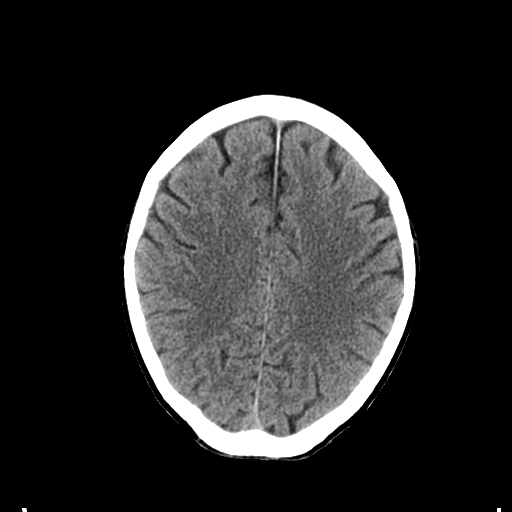
[im 24/32  brain]
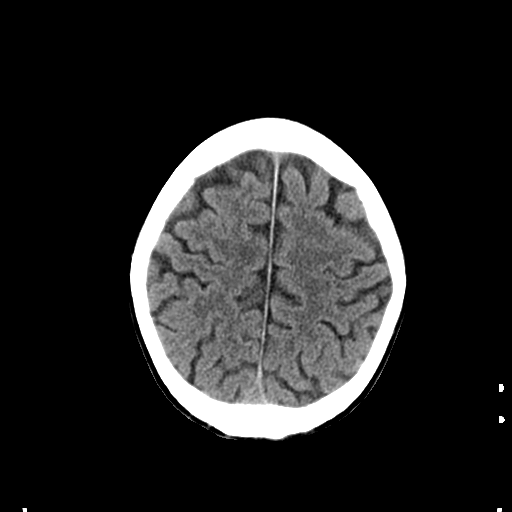
[im 26/32  brain]
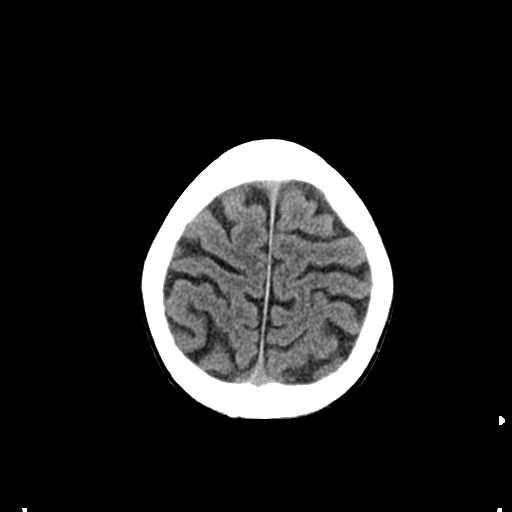
[im 26/32  bone]
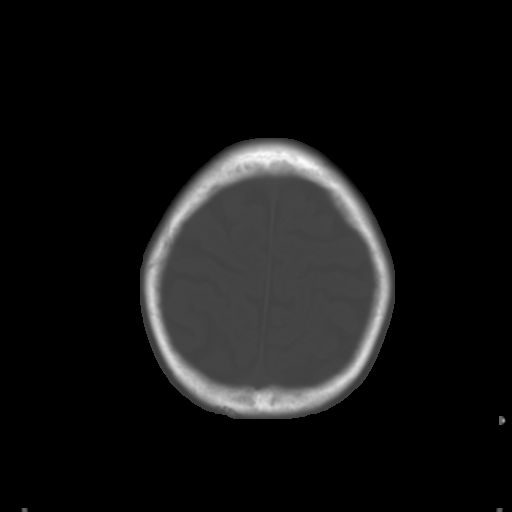
[im 29/32  brain]
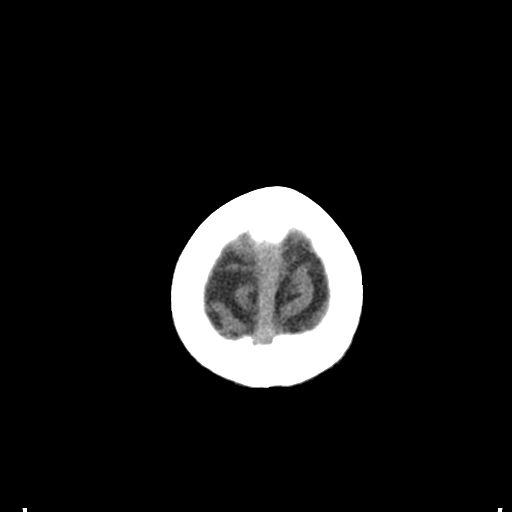

[Series 4: coronal soft tissue · coronal · 0.30mm/px · 3 of 63 slices shown]
[im 21/63  brain]
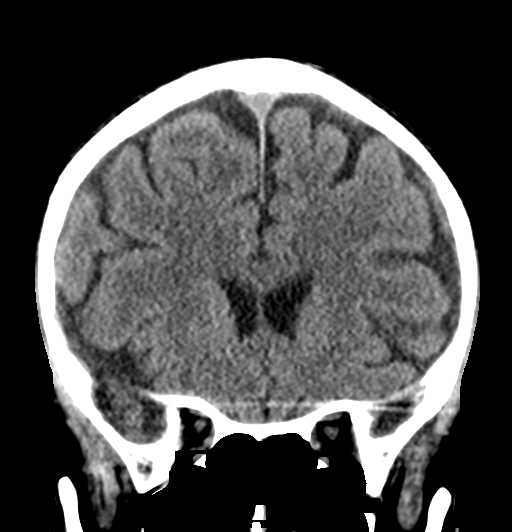
[im 28/63  brain]
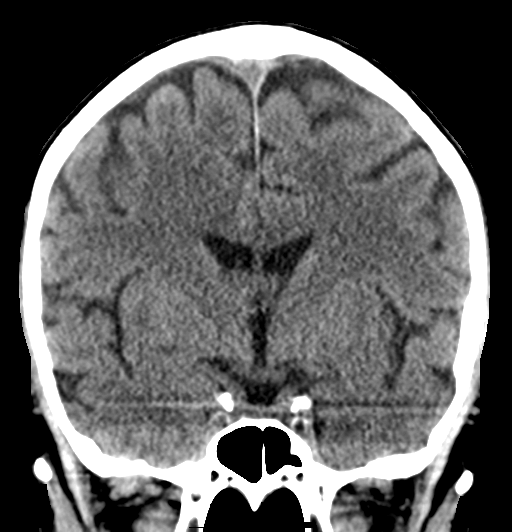
[im 35/63  brain]
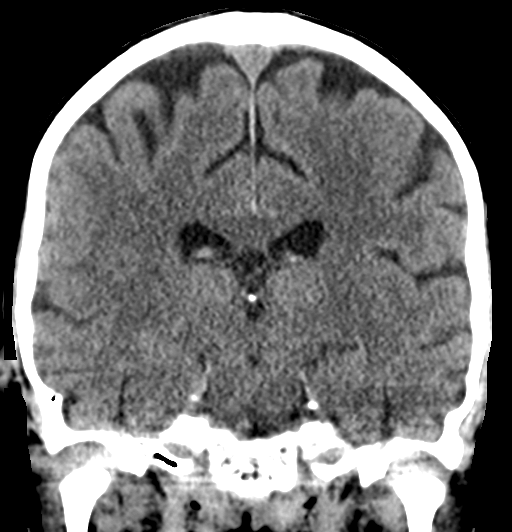

[Series 5: sagittal soft tissue · sagittal · 0.31mm/px · 3 of 49 slices shown]
[im 17/49  brain]
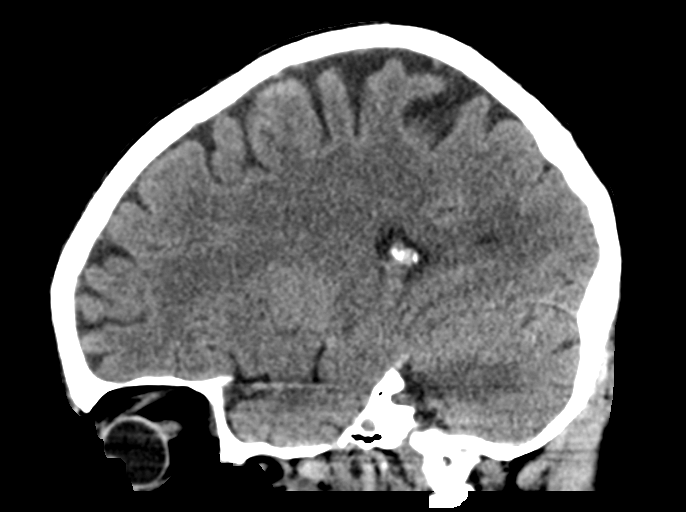
[im 25/49  brain]
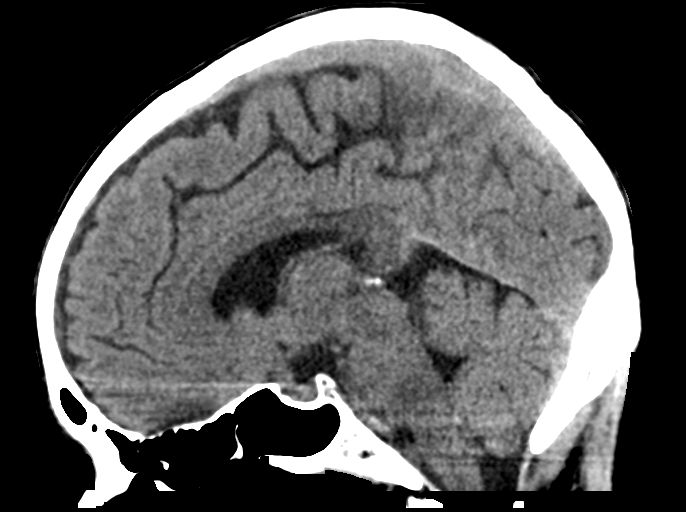
[im 33/49  brain]
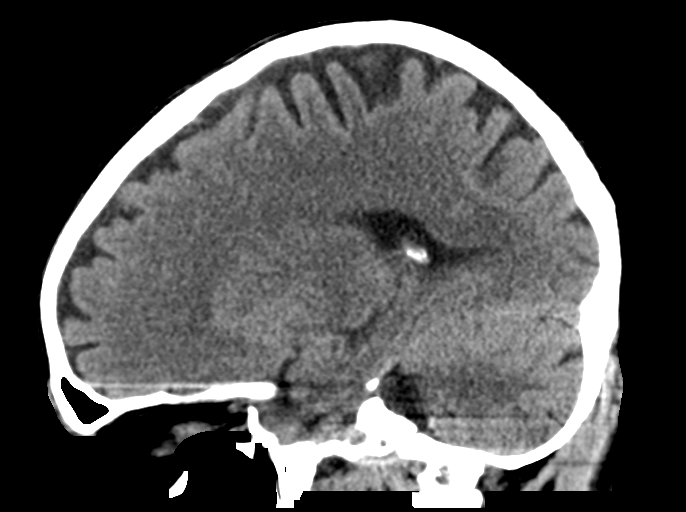

[16 of 47 positions shown; findings below may reference images not displayed]

FINDINGS: Brain: No evidence of acute infarction, hemorrhage, hydrocephalus,
extra-axial collection or mass lesion/mass effect.

Vascular: No hyperdense vessel or unexpected calcification.

Skull: Normal. Negative for fracture or focal lesion.

Sinuses/Orbits: No acute finding.

Other: None
IMPRESSION: Negative.  No CT evidence for acute intracranial abnormality

## 2020-05-22 DIAGNOSIS — R2231 Localized swelling, mass and lump, right upper limb: Secondary | ICD-10-CM | POA: Diagnosis not present

## 2020-05-23 DIAGNOSIS — R2231 Localized swelling, mass and lump, right upper limb: Secondary | ICD-10-CM | POA: Diagnosis not present

## 2020-05-24 DIAGNOSIS — R2231 Localized swelling, mass and lump, right upper limb: Secondary | ICD-10-CM | POA: Diagnosis not present
